# Patient Record
Sex: Female | Born: 1965
Health system: Southern US, Community
[De-identification: ages and names within clinical notes are randomized; demographics above are authoritative.]

## PROBLEM LIST (undated history)

## (undated) ENCOUNTER — Emergency Department (HOSPITAL_COMMUNITY): Admission: EM | Payer: Self-pay | Source: Home / Self Care

## (undated) DIAGNOSIS — K219 Gastro-esophageal reflux disease without esophagitis: Secondary | ICD-10-CM

## (undated) DIAGNOSIS — T7840XA Allergy, unspecified, initial encounter: Secondary | ICD-10-CM

## (undated) DIAGNOSIS — N979 Female infertility, unspecified: Secondary | ICD-10-CM

## (undated) DIAGNOSIS — D649 Anemia, unspecified: Secondary | ICD-10-CM

## (undated) DIAGNOSIS — K625 Hemorrhage of anus and rectum: Secondary | ICD-10-CM

## (undated) DIAGNOSIS — H269 Unspecified cataract: Secondary | ICD-10-CM

## (undated) DIAGNOSIS — B009 Herpesviral infection, unspecified: Secondary | ICD-10-CM

## (undated) HISTORY — DX: Herpesviral infection, unspecified: B00.9

## (undated) HISTORY — PX: CYST REMOVAL HAND: SHX6279

## (undated) HISTORY — DX: Unspecified cataract: H26.9

## (undated) HISTORY — DX: Anemia, unspecified: D64.9

## (undated) HISTORY — DX: Female infertility, unspecified: N97.9

## (undated) HISTORY — DX: Gastro-esophageal reflux disease without esophagitis: K21.9

## (undated) HISTORY — DX: Hemorrhage of anus and rectum: K62.5

## (undated) HISTORY — DX: Allergy, unspecified, initial encounter: T78.40XA

## (undated) HISTORY — PX: EYE SURGERY: SHX253

---

## 2001-05-05 ENCOUNTER — Other Ambulatory Visit: Admission: RE | Admit: 2001-05-05 | Discharge: 2001-05-05 | Payer: Self-pay | Admitting: Obstetrics and Gynecology

## 2001-05-19 ENCOUNTER — Encounter: Payer: Self-pay | Admitting: *Deleted

## 2001-05-19 ENCOUNTER — Ambulatory Visit (HOSPITAL_COMMUNITY): Admission: RE | Admit: 2001-05-19 | Discharge: 2001-05-19 | Payer: Self-pay | Admitting: *Deleted

## 2002-07-06 ENCOUNTER — Other Ambulatory Visit: Admission: RE | Admit: 2002-07-06 | Discharge: 2002-07-06 | Payer: Self-pay | Admitting: Obstetrics and Gynecology

## 2003-07-26 ENCOUNTER — Other Ambulatory Visit: Admission: RE | Admit: 2003-07-26 | Discharge: 2003-07-26 | Payer: Self-pay | Admitting: Obstetrics and Gynecology

## 2005-07-15 ENCOUNTER — Encounter
Admission: RE | Admit: 2005-07-15 | Discharge: 2005-09-05 | Payer: Self-pay | Admitting: Physical Medicine & Rehabilitation

## 2006-08-29 ENCOUNTER — Encounter: Admission: RE | Admit: 2006-08-29 | Discharge: 2006-08-29 | Payer: Self-pay | Admitting: Obstetrics & Gynecology

## 2007-06-29 ENCOUNTER — Encounter: Payer: Self-pay | Admitting: Internal Medicine

## 2007-07-10 ENCOUNTER — Ambulatory Visit: Payer: Self-pay | Admitting: Internal Medicine

## 2007-07-10 DIAGNOSIS — J309 Allergic rhinitis, unspecified: Secondary | ICD-10-CM | POA: Insufficient documentation

## 2007-07-10 DIAGNOSIS — B351 Tinea unguium: Secondary | ICD-10-CM

## 2007-07-10 DIAGNOSIS — K921 Melena: Secondary | ICD-10-CM | POA: Insufficient documentation

## 2007-07-10 DIAGNOSIS — K219 Gastro-esophageal reflux disease without esophagitis: Secondary | ICD-10-CM

## 2007-07-10 DIAGNOSIS — Z872 Personal history of diseases of the skin and subcutaneous tissue: Secondary | ICD-10-CM | POA: Insufficient documentation

## 2007-07-10 DIAGNOSIS — K625 Hemorrhage of anus and rectum: Secondary | ICD-10-CM

## 2007-07-10 DIAGNOSIS — D649 Anemia, unspecified: Secondary | ICD-10-CM | POA: Insufficient documentation

## 2007-07-10 HISTORY — DX: Hemorrhage of anus and rectum: K62.5

## 2007-07-24 ENCOUNTER — Ambulatory Visit: Payer: Self-pay | Admitting: Internal Medicine

## 2007-07-24 ENCOUNTER — Ambulatory Visit: Payer: Self-pay | Admitting: Gastroenterology

## 2007-07-24 DIAGNOSIS — M8920 Other disorders of bone development and growth, unspecified site: Secondary | ICD-10-CM

## 2007-07-27 LAB — CONVERTED CEMR LAB
AST: 21 units/L (ref 0–37)
Basophils Relative: 0.2 % (ref 0.0–1.0)
CO2: 27 meq/L (ref 19–32)
Chloride: 101 meq/L (ref 96–112)
Cholesterol: 158 mg/dL (ref 0–200)
Creatinine, Ser: 0.7 mg/dL (ref 0.4–1.2)
Eosinophils Relative: 1.3 % (ref 0.0–5.0)
Ferritin: 42.8 ng/mL (ref 10.0–291.0)
Glucose, Bld: 89 mg/dL (ref 70–99)
HCT: 35.6 % — ABNORMAL LOW (ref 36.0–46.0)
Hemoglobin: 12.5 g/dL (ref 12.0–15.0)
LDL Cholesterol: 89 mg/dL (ref 0–99)
MCHC: 35.2 g/dL (ref 30.0–36.0)
Monocytes Absolute: 0.3 10*3/uL (ref 0.2–0.7)
Neutrophils Relative %: 49.3 % (ref 43.0–77.0)
Potassium: 3.9 meq/L (ref 3.5–5.1)
RDW: 11.8 % (ref 11.5–14.6)
Sodium: 134 meq/L — ABNORMAL LOW (ref 135–145)
TSH: 1.3 microintl units/mL (ref 0.35–5.50)
Total Bilirubin: 0.7 mg/dL (ref 0.3–1.2)
Total Protein: 6.4 g/dL (ref 6.0–8.3)
VLDL: 4 mg/dL (ref 0–40)
WBC: 3.4 10*3/uL — ABNORMAL LOW (ref 4.5–10.5)

## 2007-08-03 ENCOUNTER — Encounter: Payer: Self-pay | Admitting: Internal Medicine

## 2007-08-19 ENCOUNTER — Ambulatory Visit: Payer: Self-pay | Admitting: Gastroenterology

## 2007-08-19 ENCOUNTER — Encounter: Payer: Self-pay | Admitting: Internal Medicine

## 2007-08-20 HISTORY — PX: LASIK: SHX215

## 2007-10-13 ENCOUNTER — Telehealth: Payer: Self-pay | Admitting: Internal Medicine

## 2007-11-04 ENCOUNTER — Encounter: Payer: Self-pay | Admitting: Internal Medicine

## 2008-01-07 ENCOUNTER — Ambulatory Visit (HOSPITAL_BASED_OUTPATIENT_CLINIC_OR_DEPARTMENT_OTHER): Admission: RE | Admit: 2008-01-07 | Discharge: 2008-01-07 | Payer: Self-pay | Admitting: Orthopedic Surgery

## 2008-02-24 ENCOUNTER — Telehealth: Payer: Self-pay | Admitting: Internal Medicine

## 2008-05-04 ENCOUNTER — Encounter: Payer: Self-pay | Admitting: Internal Medicine

## 2008-06-30 ENCOUNTER — Ambulatory Visit (HOSPITAL_COMMUNITY): Admission: RE | Admit: 2008-06-30 | Discharge: 2008-06-30 | Payer: Self-pay | Admitting: Obstetrics & Gynecology

## 2008-07-05 ENCOUNTER — Telehealth: Payer: Self-pay | Admitting: Internal Medicine

## 2009-04-17 ENCOUNTER — Telehealth: Payer: Self-pay | Admitting: *Deleted

## 2009-05-15 ENCOUNTER — Emergency Department (HOSPITAL_COMMUNITY): Admission: EM | Admit: 2009-05-15 | Discharge: 2009-05-15 | Payer: Self-pay | Admitting: Family Medicine

## 2009-05-20 ENCOUNTER — Encounter: Payer: Self-pay | Admitting: Internal Medicine

## 2009-05-20 LAB — CONVERTED CEMR LAB
ALT: 12 units/L (ref 0–35)
AST: 17 units/L (ref 0–37)
Alkaline Phosphatase: 44 units/L (ref 39–117)
Basophils Absolute: 0 10*3/uL (ref 0.0–0.1)
Basophils Relative: 0 % (ref 0–1)
Bilirubin, Direct: 0.1 mg/dL (ref 0.0–0.3)
CO2: 23 meq/L (ref 19–32)
Calcium: 8.8 mg/dL (ref 8.4–10.5)
Cholesterol: 138 mg/dL (ref 0–200)
Creatinine, Ser: 0.76 mg/dL (ref 0.40–1.20)
Eosinophils Absolute: 0 10*3/uL (ref 0.0–0.7)
Glucose, Bld: 86 mg/dL (ref 70–99)
Indirect Bilirubin: 0.3 mg/dL (ref 0.0–0.9)
MCHC: 33.9 g/dL (ref 30.0–36.0)
MCV: 87.4 fL (ref 78.0–100.0)
Monocytes Absolute: 0.2 10*3/uL (ref 0.1–1.0)
Monocytes Relative: 5 % (ref 3–12)
Neutro Abs: 2.6 10*3/uL (ref 1.7–7.7)
Neutrophils Relative %: 58 % (ref 43–77)
RDW: 12.3 % (ref 11.5–15.5)
Sodium: 138 meq/L (ref 135–145)
TSH: 0.981 microintl units/mL (ref 0.350–4.500)
Total Bilirubin: 0.4 mg/dL (ref 0.3–1.2)
Total CHOL/HDL Ratio: 2.3

## 2009-05-23 ENCOUNTER — Ambulatory Visit: Payer: Self-pay | Admitting: Internal Medicine

## 2009-06-20 ENCOUNTER — Emergency Department (HOSPITAL_COMMUNITY): Admission: EM | Admit: 2009-06-20 | Discharge: 2009-06-20 | Payer: Self-pay | Admitting: Emergency Medicine

## 2009-08-08 ENCOUNTER — Encounter: Payer: Self-pay | Admitting: Internal Medicine

## 2009-09-20 ENCOUNTER — Ambulatory Visit: Payer: Self-pay | Admitting: Internal Medicine

## 2009-09-20 DIAGNOSIS — R209 Unspecified disturbances of skin sensation: Secondary | ICD-10-CM | POA: Insufficient documentation

## 2009-10-27 ENCOUNTER — Encounter: Payer: Self-pay | Admitting: Internal Medicine

## 2009-11-12 ENCOUNTER — Ambulatory Visit (HOSPITAL_COMMUNITY): Admission: RE | Admit: 2009-11-12 | Discharge: 2009-11-12 | Payer: Self-pay | Admitting: Neurology

## 2010-09-09 ENCOUNTER — Encounter: Payer: Self-pay | Admitting: Obstetrics & Gynecology

## 2010-09-20 NOTE — Letter (Signed)
Summary: [facility]  [facility]   Imported By: Maryln Gottron 09/14/2009 09:41:57  _____________________________________________________________________  External Attachment:    Type:   Image     Comment:   External Document

## 2010-09-20 NOTE — Assessment & Plan Note (Signed)
Summary: consult re: facial pain and dizzines/intermitent/cjr   Vital Signs:  Patient profile:   45 year old female Menstrual status:  regular LMP:     09/06/2009 Weight:      117 pounds Temp:     98.7 degrees F oral Pulse rate:   66 / minute BP sitting:   100 / 60  (right arm) Cuff size:   regular  Vitals Entered By: Romualdo Bolk, CMA (AAMA) (September 20, 2009 3:54 PM) CC: Pt is here to discuss burning on cheek bone and then numbness and tingling around lip on rt side. This has been going on and off a few years. LMP (date): 09/06/2009 LMP - Character: normal Menarche (age onset years): 12   Menses interval (days): 21-28 Menstrual flow (days): 5 Enter LMP: 09/06/2009 Last PAP Result normal   History of Present Illness: Valerie [name] comes  in today for   above.  Onset  she believes   she started the pain with neuralgia after the crown in upper teeth  and then root canal . then got better .   has had 3 root canals on right upper top teeth.  Early on used gabapentin short term then resolved.  However   more recently has had increasing frequency without other cause . temp sensitivities , numbness tingling  in summer .   imaging of teeth show no new problem and not felt to be dental  .   burning pain  mostly on right cheek lasts  for hours  or less.  no assoicated symptom   Gets labial oral l hsv  but not related.   no HAs syncope or weakness.   Considering pregnancy and wishes further opinon as to etioogy and prognosis of this situation.    Preventive Screening-Counseling & Management  Alcohol-Tobacco     Alcohol drinks/day: <1     Alcohol type: mixed drinks     Smoking Status: never  Caffeine-Diet-Exercise     Caffeine use/day: 2     Does Patient Exercise: yes     Type of exercise: walking, wts, pilates     Times/week: 3 hours  Current Medications (verified): 1)  Claritin 10 Mg  Tabs (Loratadine) 2)  Multiple Vitamin   Tabs (Multiple Vitamin) 3)  Msm 1500 Mg   Tabs (Methylsulfonylmethane) 4)  Fish Oil   Oil (Fish Oil) 5)  Allergy Shots 6)  Valtrex 1 Gm  Tabs (Valacyclovir Hcl) .... Take 2 and Repeat in 12 Hours or As Directed 7)  Vitamin B Complex-C   Caps (B Complex-C)  Allergies (verified): 1)  ! Biaxin 2)  ! Vantin (Cefpodoxime Proxetil) 3)  ! Augmentin  Past History:  Past medical, surgical, family and social histories (including risk factors) reviewed, and no changes noted (except as noted below).  Past Medical History: Reviewed history from 05/23/2009 and no changes required. Allergic rhinitis     on shots  cats and seasonal  Anemia-NOS GERD hsv labialis  Past Surgical History: Lasik- [facility]- 2009  Past History:  Care Management: Gynecology: Jonny Ruiz Allergy: [place]  Family History: Reviewed history from 07/10/2007 and no changes required. Family History of Alcoholism/Addiction- Uncles Family History Breast cancer 1st degree relative <50-Aunt (Maternal Mom's cousin) Family History Diabetes 1st degree relative-father, Maternal grandparents Family History High cholesterol- father, brother Family History of Stroke F 1st degree relative <60-Maternal Grandmother Family History of Stroke M 1st degree relative <50-Father and Uncles Family History of Sudden Death- Maternal Grandfather Family History of  Cardiovascular disorder- Father Bipass, Maternal Grandmother CHF colon polyps ht brother elev TG  Social History: Reviewed history from 05/23/2009 and no changes required. 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 hhof 1   no pets   Review of Systems  The patient denies anorexia, fever, weight loss, weight gain, vision loss, decreased hearing, hoarseness, chest pain, syncope, dyspnea on exertion, headaches, muscle weakness, transient blindness, difficulty walking, abnormal bleeding, and enlarged  lymph nodes.         ocass dizzy  when tired  no syncope or vertigo   Physical Exam  General:  Well-developed,well-nourished,in no acute distress; alert,appropriate and cooperative throughout examination Head:  normocephalic, atraumatic, and no abnormalities observed.  no tenderness   face no lesions  Eyes:  PERRL, EOMs full, conjunctiva clear  Ears:  R ear normal, L ear normal, and no external deformities.  slight tmj crepitis Nose:  no external deformity, no external erythema, and no nasal discharge.   Mouth:  good dentition and pharynx pink and moist.   Neck:  No deformities, masses, or tenderness noted. no bruits  Lungs:  Normal respiratory effort, chest expands symmetrically. Lungs are clear to auscultation, no crackles or wheezes. Heart:  Normal rate and regular rhythm. S1 and S2 normal without gallop, murmur, click, rub or other extra sounds. Neurologic:  alert & oriented X3, strength normal in all extremities, gait normal, DTRs symmetrical and normal, and Romberg negative.   neg drift . tandem gait ok  no noted weakness or clonus Skin:  turgor normal, color normal, no ecchymoses, and no petechiae.   Cervical Nodes:  No lymphadenopathy noted Psych:  Oriented X3, good eye contact, not anxious appearing, and not depressed appearing.     Impression & Recommendations:  Problem # 1:  DYSESTHESIA (ICD-782.0) face    poss related to previous  dental procedure but has progressed some .   r/o cns, ms  or other  causes .  options discussed . will get neuro opinion. Consider imaging.   Orders: Neurology Referral (Neuro)  Complete Medication List: 1)  Claritin 10 Mg Tabs (Loratadine) 2)  Multiple Vitamin Tabs (Multiple vitamin) 3)  Msm 1500 Mg Tabs (Methylsulfonylmethane) 4)  Fish Oil Oil (Fish oil) 5)  Allergy Shots  6)  Valtrex 1 Gm Tabs (Valacyclovir hcl) .... Take 2 and repeat in 12 hours or as directed 7)  Vitamin B Complex-c Caps (B complex-c)  Patient Instructions: 1)  will  refer to Dr Anne Hahn  2)  call as needed if needed

## 2010-09-20 NOTE — Consult Note (Signed)
Summary: [facility]  [facility]   Imported By: Maryln Gottron 11/01/2009 12:28:31  _____________________________________________________________________  External Attachment:    Type:   Image     Comment:   External Document

## 2011-01-04 NOTE — Op Note (Signed)
Valerie [name], Valerie [name]            ACCOUNT NO.:  [id number]   MEDICAL RECORD NO.:  [id number]          PATIENT TYPE:  AMB   LOCATION:  [facility]SC                          FACILITY:  [facility]   PHYSICIAN:  Katy Fitch. Sypher, M.D. 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   POSTOPERATIVE DIAGNOSES:  Stage III paronychia and felon.   OPERATION:  Incision and drainage of left thumb dorsal and ulnar nail  fold as well as pulp incision and drainage.   SURGEON:  Katy Fitch. Sypher, MD   ASSISTANT:  None.   ANESTHESIA:  Lidocaine 2% metacarpal head level block at left thumb.  This was performed in the minor operating room of the [facility].   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  She presented for evaluation on Jan 06, 2008, of a possible  felon and paronychia.   She has a history of herpes simplex viral colonization and has had fever  blisters.  Her presentation was challenging to interpret.  She did not  have the features of a bacterial paronychia, but had had a prodrome of  tingling and burning.  We initially began treatment for both paronychia  and possible herpetic whitlow.  She was advised to use an antiviral as  well as an antibiotic.  She had been taking Keflex.  She held the Keflex  due to GI upset.   We asked her to contact us within 12-24 hours if her thumb progressed  and in deed she developed some purulent material beneath the dorsal nail  fold.  Therefore, she was asked to come to the Surgical Center for an  urgent incision and drainage of her thumb.   PROCEDURE IN DETAIL:  Valerie [name] was seen in the minor operating  room at the [facility].  Inspection of her thumb revealed  collection of purulent material beneath the dorsal ulnar nail fold.  This is consistent with evolution of a bacterial paronychia.   We advised proceeding with incision and drainage.   Her left hand and arm were prepped with Betadine soap and solution.  Lidocaine 2% was infiltrated at metacarpal head level to obtain a  digital block.   After 5 minutes, excellent anesthesia was achieved.   The finger was not exsanguinated; however, a quarter-inch Penrose drain  was placed as a digital tourniquet.   The ulnar 3 mm of the nail plate was removed with tenotomy scissors and  a hemostat.  The dorsal nail fold was probed with a sharp Market researcher.  Immediately, purulent material was recovered.  This was  cultured for aerobic and anaerobic growth including a Gram stain.   Her dorsal nail fold was then drained and unroofed.  A considerable  amount of purulent material was recovered from the dorsal ulnar nail  fold.  The pulp was entered directly on  the periosteum from an ulnar  aspect.  Some serosanguineous fluid was recovered from the pulp.   After decompression, the pulp tension was increased.   The wound was then rinsed with sterile saline followed by packing with  iodoform gauze.   We have asked [place] to return for followup in 24 hours for dressing  change and initiation of whirlpool therapy.   Her immediate Gram stain results revealed gram-positive cocci in pairs.   She will take doxycycline 100 mg p.o. b.i.d. x7 days and Septra DS one  p.o. b.i.d. to rule out a possible methicillin-resistant Staph  infection.   Note that she also has a prescription for Vicodin 5 mg one p.o. q.4-6 h.  p.r.n. pain as a postoperative analgesia.      Katy Fitch Sypher, M.D.  Electronically Signed     RVS/MEDQ  D:  01/08/2008  T:  01/09/2008  Job:  [id number]

## 2011-01-25 ENCOUNTER — Emergency Department (HOSPITAL_BASED_OUTPATIENT_CLINIC_OR_DEPARTMENT_OTHER)
Admission: EM | Admit: 2011-01-25 | Discharge: 2011-01-26 | Disposition: A | Discharge status: Home / Self Care | Payer: 59 | Attending: Emergency Medicine | Admitting: Emergency Medicine

## 2011-01-25 DIAGNOSIS — R112 Nausea with vomiting, unspecified: Secondary | ICD-10-CM | POA: Insufficient documentation

## 2011-01-25 DIAGNOSIS — K2970 Gastritis, unspecified, without bleeding: Secondary | ICD-10-CM | POA: Insufficient documentation

## 2011-01-25 DIAGNOSIS — K2990 Gastroduodenitis, unspecified, without bleeding: Secondary | ICD-10-CM | POA: Insufficient documentation

## 2011-01-25 LAB — URINALYSIS, ROUTINE W REFLEX MICROSCOPIC
Bilirubin Urine: NEGATIVE
Glucose, UA: NEGATIVE mg/dL
Hgb urine dipstick: NEGATIVE
Specific Gravity, Urine: 1.024 (ref 1.005–1.030)
Urobilinogen, UA: 0.2 mg/dL (ref 0.0–1.0)
pH: 6 (ref 5.0–8.0)

## 2011-01-25 LAB — COMPREHENSIVE METABOLIC PANEL
ALT: 19 U/L (ref 0–35)
Alkaline Phosphatase: 47 U/L (ref 39–117)
BUN: 13 mg/dL (ref 6–23)
CO2: 24 mEq/L (ref 19–32)
Calcium: 9.1 mg/dL (ref 8.4–10.5)
GFR calc Af Amer: >60 mL/min (ref >60)
GFR calc non Af Amer: >60 mL/min (ref >60)
Glucose, Bld: 103 mg/dL — ABNORMAL HIGH (ref 70–99)
Sodium: 133 mEq/L — ABNORMAL LOW (ref 135–145)

## 2011-01-25 LAB — DIFFERENTIAL
Basophils Relative: 0 % (ref 0–1)
Lymphocytes Relative: 28 % (ref 12–46)
Lymphs Abs: 1.1 10*3/uL (ref 0.7–4.0)
Monocytes Absolute: 0.2 10*3/uL (ref 0.1–1.0)
Monocytes Relative: 5 % (ref 3–12)
Neutro Abs: 2.7 10*3/uL (ref 1.7–7.7)
Neutrophils Relative %: 66 % (ref 43–77)

## 2011-01-25 LAB — CBC
HCT: 35.6 % — ABNORMAL LOW (ref 36.0–46.0)
Hemoglobin: 12.7 g/dL (ref 12.0–15.0)
MCH: 30.2 pg (ref 26.0–34.0)
MCHC: 35.7 g/dL (ref 30.0–36.0)
RBC: 4.21 MIL/uL (ref 3.87–5.11)

## 2011-01-25 LAB — LIPASE, BLOOD: Lipase: 26 U/L (ref 11–59)

## 2011-03-28 ENCOUNTER — Encounter: Payer: Self-pay | Admitting: Family Medicine

## 2011-03-28 ENCOUNTER — Ambulatory Visit (INDEPENDENT_AMBULATORY_CARE_PROVIDER_SITE_OTHER): Payer: 59 | Admitting: Family Medicine

## 2011-03-28 VITALS — BP 110/70 | Temp 98.3°F | Wt 119.0 lb

## 2011-03-28 DIAGNOSIS — M549 Dorsalgia, unspecified: Secondary | ICD-10-CM

## 2011-03-28 DIAGNOSIS — M546 Pain in thoracic spine: Secondary | ICD-10-CM

## 2011-03-28 MED ORDER — CYCLOBENZAPRINE HCL 5 MG PO TABS: 5.0000 mg | ORAL_TABLET | Freq: Three times a day (TID) | ORAL | Status: AC | PRN

## 2011-03-28 NOTE — Progress Notes (Signed)
  Subjective:    Patient ID: Valerie [name], female    DOB: [birth year], 45 y.o.   MRN: [id number]  HPI Healthy 45 year old female seen with several month history of progressive upper back neck and thoracic pain. No spinal pain but pain is more diffuse and bilateral. Started mostly neck and upper back and spreading inferior. No specific injury. Pain relatively constant.  Achy quality and 5/10 severity at worse. Symptoms seem to be worse late in the day. No specific injury. Works as a Transport planner. Denies radiculopathy symptoms. Does exercise a few days per week. sleep generally okay. No clear exacerbating features. Cannot tolerate ibuprofen. Heat without much relief. Denies any systemic symptoms such as appetite or weight changes, fever, chills, cough, dyspnea, or any weakness   Review of Systems  Constitutional: Negative for fever, chills, appetite change, fatigue and unexpected weight change.  Respiratory: Negative for cough and shortness of breath.   Cardiovascular: Negative for chest pain.  Musculoskeletal: Positive for back pain. Negative for arthralgias.  Skin: Negative for rash.  Neurological: Negative for headaches.  Hematological: Negative for adenopathy.       Objective:   Physical Exam  Constitutional: She appears well-developed and well-nourished. No distress.  Neck: Normal range of motion. Neck supple. No thyromegaly present.  Cardiovascular: Normal rate and regular rhythm.   Pulmonary/Chest: Effort normal and breath sounds normal. No respiratory distress. She has no wheezes. She has no rales.  Musculoskeletal:       No upper extremity muscle atrophy. Full range of motion both shoulders  Mild tenderness trapezius muscles bilaterally  Lymphadenopathy:    She has no cervical adenopathy.  Neurological:       No weakness upper extremities. Symmetric reflexes          Assessment & Plan:  Progressive upper back and lower thoracic pain. Suspect muscular soft tissue etiology. Referral  for physical therapy and try low-dose cyclobenzaprine 5 mg each bedtime. Aerobic exercise as tolerated

## 2011-03-28 NOTE — Patient Instructions (Signed)
We will call you regarding physical therapy appointment

## 2011-05-09 ENCOUNTER — Ambulatory Visit: Payer: 59 | Attending: Family Medicine | Admitting: Rehabilitative and Restorative Service Providers"

## 2011-05-09 DIAGNOSIS — M546 Pain in thoracic spine: Secondary | ICD-10-CM | POA: Insufficient documentation

## 2011-05-09 DIAGNOSIS — M2569 Stiffness of other specified joint, not elsewhere classified: Secondary | ICD-10-CM | POA: Insufficient documentation

## 2011-05-09 DIAGNOSIS — IMO0001 Reserved for inherently not codable concepts without codable children: Secondary | ICD-10-CM | POA: Insufficient documentation

## 2011-05-14 ENCOUNTER — Ambulatory Visit: Payer: 59 | Admitting: Physical Therapy

## 2011-05-15 LAB — WOUND CULTURE: Gram Stain: NONE SEEN

## 2011-05-15 LAB — ANAEROBIC CULTURE: Gram Stain: NONE SEEN

## 2011-05-22 ENCOUNTER — Ambulatory Visit: Payer: 59 | Attending: Family Medicine | Admitting: Physical Therapy

## 2011-05-22 DIAGNOSIS — IMO0001 Reserved for inherently not codable concepts without codable children: Secondary | ICD-10-CM | POA: Insufficient documentation

## 2011-05-22 DIAGNOSIS — M546 Pain in thoracic spine: Secondary | ICD-10-CM | POA: Insufficient documentation

## 2011-05-22 DIAGNOSIS — M2569 Stiffness of other specified joint, not elsewhere classified: Secondary | ICD-10-CM | POA: Insufficient documentation

## 2011-05-27 ENCOUNTER — Ambulatory Visit: Payer: 59 | Admitting: Physical Therapy

## 2011-05-29 ENCOUNTER — Ambulatory Visit: Payer: 59 | Admitting: Physical Therapy

## 2011-06-04 ENCOUNTER — Ambulatory Visit: Payer: 59 | Admitting: Physical Therapy

## 2011-06-05 ENCOUNTER — Encounter: Payer: 59 | Admitting: Physical Therapy

## 2011-06-10 ENCOUNTER — Ambulatory Visit: Payer: 59 | Admitting: Physical Therapy

## 2011-06-12 ENCOUNTER — Encounter: Payer: 59 | Admitting: Physical Therapy

## 2011-06-13 ENCOUNTER — Encounter: Payer: 59 | Admitting: Rehabilitative and Restorative Service Providers"

## 2011-06-20 ENCOUNTER — Ambulatory Visit: Payer: 59 | Attending: Family Medicine | Admitting: Physical Therapy

## 2011-06-20 DIAGNOSIS — M546 Pain in thoracic spine: Secondary | ICD-10-CM | POA: Insufficient documentation

## 2011-06-20 DIAGNOSIS — IMO0001 Reserved for inherently not codable concepts without codable children: Secondary | ICD-10-CM | POA: Insufficient documentation

## 2011-06-20 DIAGNOSIS — M2569 Stiffness of other specified joint, not elsewhere classified: Secondary | ICD-10-CM | POA: Insufficient documentation

## 2011-07-01 ENCOUNTER — Telehealth: Payer: Self-pay | Admitting: Family Medicine

## 2011-07-01 DIAGNOSIS — Z0000 Encounter for general adult medical examination without abnormal findings: Secondary | ICD-10-CM

## 2011-07-01 NOTE — Telephone Encounter (Signed)
This pt is going to [facility] on 11/28 for cpx labs. Can the orders be put in please? Thanks.

## 2011-07-01 NOTE — Telephone Encounter (Signed)
Orders placed in epic

## 2011-07-10 ENCOUNTER — Other Ambulatory Visit: Payer: 59

## 2011-07-17 ENCOUNTER — Encounter: Payer: 59 | Admitting: Internal Medicine

## 2011-07-19 ENCOUNTER — Ambulatory Visit: Payer: 59 | Admitting: Rehabilitation

## 2011-07-25 ENCOUNTER — Ambulatory Visit: Payer: 59 | Admitting: Rehabilitative and Restorative Service Providers"

## 2011-07-31 ENCOUNTER — Ambulatory Visit: Payer: 59 | Admitting: Rehabilitative and Restorative Service Providers"

## 2011-08-27 ENCOUNTER — Telehealth: Payer: Self-pay | Admitting: *Deleted

## 2011-08-27 NOTE — Telephone Encounter (Signed)
[employer] name of company?

## 2011-08-27 NOTE — Telephone Encounter (Signed)
Asking for Medical Necessity Form to be faxed and to call [phone].  She states her name is Valerie [name], but I cannot understand the name of the company.  Will send you the call, Harriett Sine to see if you may recognize it.

## 2011-11-25 ENCOUNTER — Telehealth: Payer: Self-pay | Admitting: Internal Medicine

## 2011-11-25 NOTE — Telephone Encounter (Signed)
Pls advise.  

## 2011-11-25 NOTE — Telephone Encounter (Signed)
Unsure what this means  Ok to do cpx labs  Preferred in a location  where it flows into EHR.  Please arrange for this. V70.0

## 2011-11-25 NOTE — Telephone Encounter (Signed)
Patient called stating that she would like to have her cpx labs done at [facility] and wanted to know if she can have an rx for that. Please advise

## 2011-11-29 ENCOUNTER — Other Ambulatory Visit: Payer: Self-pay | Admitting: Internal Medicine

## 2011-11-29 LAB — BASIC METABOLIC PANEL
Calcium: 8.8 mg/dL (ref 8.4–10.5)
Glucose, Bld: 81 mg/dL (ref 70–99)
Potassium: 3.8 mEq/L (ref 3.5–5.3)
Sodium: 135 mEq/L (ref 135–145)

## 2011-11-29 LAB — CBC WITH DIFFERENTIAL/PLATELET
Basophils Relative: 0 % (ref 0–1)
Hemoglobin: 12.6 g/dL (ref 12.0–15.0)
Lymphocytes Relative: 48 % — ABNORMAL HIGH (ref 12–46)
Lymphs Abs: 1.6 10*3/uL (ref 0.7–4.0)
MCHC: 34.1 g/dL (ref 30.0–36.0)
Monocytes Relative: 7 % (ref 3–12)
Neutro Abs: 1.4 10*3/uL — ABNORMAL LOW (ref 1.7–7.7)
Neutrophils Relative %: 44 % (ref 43–77)
RBC: 4.2 MIL/uL (ref 3.87–5.11)
WBC: 3.2 10*3/uL — ABNORMAL LOW (ref 4.0–10.5)

## 2011-11-29 LAB — TSH: TSH: 1.36 u[IU]/mL (ref 0.350–4.500)

## 2011-11-29 LAB — HEPATIC FUNCTION PANEL
ALT: 15 U/L (ref 0–35)
Bilirubin, Direct: 0.1 mg/dL (ref 0.0–0.3)
Indirect Bilirubin: 0.3 mg/dL (ref 0.0–0.9)
Total Protein: 6.8 g/dL (ref 6.0–8.3)

## 2011-11-30 LAB — URINALYSIS, COMPLETE
Bacteria, UA: NONE SEEN
Bilirubin Urine: NEGATIVE
Hgb urine dipstick: NEGATIVE
Ketones, ur: NEGATIVE mg/dL
Protein, ur: NEGATIVE mg/dL
Urobilinogen, UA: 0.2 mg/dL (ref 0.0–1.0)

## 2011-12-03 ENCOUNTER — Encounter: Payer: Self-pay | Admitting: Internal Medicine

## 2011-12-03 ENCOUNTER — Ambulatory Visit (INDEPENDENT_AMBULATORY_CARE_PROVIDER_SITE_OTHER): Payer: 59 | Admitting: Internal Medicine

## 2011-12-03 VITALS — BP 104/72 | HR 76 | Temp 98.4°F | Ht 63.5 in | Wt 118.0 lb

## 2011-12-03 DIAGNOSIS — J309 Allergic rhinitis, unspecified: Secondary | ICD-10-CM

## 2011-12-03 DIAGNOSIS — R142 Eructation: Secondary | ICD-10-CM

## 2011-12-03 DIAGNOSIS — Z0000 Encounter for general adult medical examination without abnormal findings: Secondary | ICD-10-CM

## 2011-12-03 DIAGNOSIS — R141 Gas pain: Secondary | ICD-10-CM

## 2011-12-03 DIAGNOSIS — R140 Abdominal distension (gaseous): Secondary | ICD-10-CM

## 2011-12-03 NOTE — Progress Notes (Signed)
Subjective:    Patient ID: Valerie [name], female    DOB: 1966/02/12, 46 y.o.   MRN: [id number]  HPI Patient comes in today for Preventive Health Care visit  GYNE: sees yearly  Did fertility treatments in the past  . Not now. Allergy : sharma   Shots   All to cats   Decreasing .  Massage :  Helps neck upper back.  Has some bloating without diet or stool changes  Poss from age  But concern about risk of ovarian cancer . No other sx related . Does take  Fish oil supplements.   Review of Systems ROS:  GEN/ HEENT: No fever, significant weight changes sweats headaches vision problems hearing changes, CV/ PULM; No chest pain shortness of breath cough, syncope,edema  change in exercise tolerance. GI /GU: No adominal pain, vomiting, change in bowel habits. No blood in the stool. No significant GU symptoms. Bloating and gas a lot.  ? Why. SKIN/HEME: ,no acute skin rashes suspicious lesions or bleeding. No lymphadenopathy, nodules, masses.  NEURO/ PSYCH:  No neurologic signs such as weakness numbness. No depression anxiety. IMM/ Allergy: No unusual infections.  Allergy .   REST of 12 system review negative except as per HPI  Past history family history social history reviewed in the electronic medical record.      Objective:   Physical Exam BP 104/72  Pulse 76  Temp(Src) 98.4 F (36.9 C) (Oral)  Ht 5' 3.5" (1.613 m)  Wt 118 lb (53.524 kg)  BMI 20.57 kg/m2  SpO2 98%  LMP 11/12/2011 Wt Readings from Last 3 Encounters:  12/03/11 118 lb (53.524 kg)  03/28/11 119 lb (53.978 kg)  09/20/09 117 lb (53.071 kg)    Physical Exam: Vital signs reviewed WUJ:WJXB is a well-developed well-nourished alert cooperative  female who appears her stated age in no acute distress.  HEENT: normocephalic atraumatic , Eyes: PERRL EOM's full, conjunctiva clear, Nares: paten,t no deformity discharge or tenderness., Ears: no deformity EAC's clear TMs with normal landmarks. Mouth: clear OP, no lesions, edema.   Moist mucous membranes. Dentition in adequate repair. NECK: supple without masses, thyromegaly or bruits. CHEST/PULM:  Clear to auscultation and percussion breath sounds equal no wheeze , rales or rhonchi. No chest wall deformities or tenderness. CV: PMI is nondisplaced, S1 S2 no gallops, murmurs, rubs. Peripheral pulses are full without delay.No JVD .  ABDOMEN: Bowel sounds normal nontender  No guard or rebound, no hepato splenomegal no CVA tenderness.  No hernia. Extremtities:  No clubbing cyanosis or edema, no acute joint swelling or redness no focal atrophy NEURO:  Oriented x3, cranial nerves 3-12 appear to be intact, no obvious focal weakness,gait within normal limits no abnormal reflexes SKIN: No acute rashes normal turgor, color, no bruising or petechiae. PSYCH: Oriented, good eye contact, no obvious depression anxiety, cognition and judgment appear normal. LN: no cervical axillaryadenopathy    Lab Results  Component Value Date   WBC 3.2* 11/29/2011   HGB 12.6 11/29/2011   HCT 36.9 11/29/2011   PLT 179 11/29/2011   GLUCOSE 81 11/29/2011   CHOL 138 05/20/2009   TRIG 37 05/20/2009   HDL 60 05/20/2009   LDLCALC 71 05/20/2009   ALT 15 11/29/2011   AST 19 11/29/2011   NA 135 11/29/2011   K 3.8 11/29/2011   CL 102 11/29/2011   CREATININE 0.75 11/29/2011   BUN 11 11/29/2011   CO2 26 11/29/2011   TSH 1.360 11/29/2011  Assessment & Plan:   Preventive Health Care Counseled regarding healthy nutrition, exercise, sleep, injury prevention, calcium vit d and healthy weight .Healthy   abd bloating  does'nt  sound pathologic  Consider stopping the FO   And disc concerns about gyne cause with dr Seymour Bars  . doestn drink milk much to explain lactose intolerance   Allergic   On shots    Has cat at home.

## 2011-12-03 NOTE — Patient Instructions (Signed)
Continue lifestyle intervention healthy eating and exercise . Talk with GYNE about abdominal  bloating but is probably dietary.   Optimize sleep  For energy level and health.

## 2012-02-25 ENCOUNTER — Ambulatory Visit (INDEPENDENT_AMBULATORY_CARE_PROVIDER_SITE_OTHER): Payer: 59 | Admitting: Internal Medicine

## 2012-02-25 ENCOUNTER — Encounter: Payer: Self-pay | Admitting: Internal Medicine

## 2012-02-25 ENCOUNTER — Encounter: Payer: Self-pay | Admitting: Gastroenterology

## 2012-02-25 VITALS — BP 102/74 | HR 74 | Temp 98.0°F | Wt 118.0 lb

## 2012-02-25 DIAGNOSIS — R195 Other fecal abnormalities: Secondary | ICD-10-CM | POA: Insufficient documentation

## 2012-02-25 DIAGNOSIS — R109 Unspecified abdominal pain: Secondary | ICD-10-CM

## 2012-02-25 DIAGNOSIS — N949 Unspecified condition associated with female genital organs and menstrual cycle: Secondary | ICD-10-CM

## 2012-02-25 DIAGNOSIS — R141 Gas pain: Secondary | ICD-10-CM

## 2012-02-25 DIAGNOSIS — R140 Abdominal distension (gaseous): Secondary | ICD-10-CM

## 2012-02-25 DIAGNOSIS — N910 Primary amenorrhea: Secondary | ICD-10-CM

## 2012-02-25 MED ORDER — METRONIDAZOLE 500 MG PO TABS: 500.0000 mg | ORAL_TABLET | Freq: Three times a day (TID) | ORAL | Status: AC

## 2012-02-25 NOTE — Patient Instructions (Addendum)
Collect the stool samples Can try impaired treatment with metronidazole.  If things are not improving we should get gastroenterology consult.  Consider postinfectious irritable bowel but pain seems more sever for this.

## 2012-02-25 NOTE — Progress Notes (Signed)
  Subjective:    Patient ID: Valerie [name], female    DOB: [birth year], 46 y.o.   MRN: [id number]  HPI Patient comes in today for SDA for  new problem evaluation. Was in her usual state of health until about 3 weeks ago when she was at the beach and she had a 3 day watery nonbloody diarrheal illness. She treated with Kaopectate breath diet advanced her diet and got better. She felt that her stools were much better at that time. Then 3 days ago she's had the onset of severe migraine abdominal cramping pain. Then abnormal stools that she states are about once a day but they're small flecks of abnormal color without blood. She has occasional postprandial pain on the left upper and some discomfort in general the lower abdomen into the right. There is no vomiting fever chills unusual rashes.  Last menstrual period 6 weeks ago and due .   Hx of unsuccessful infertility rx no dx of endometriosis.   Review of Systems Neg fever with loss chills vomiting  Has low to =mid back pain described as tightness  Upper abd .  No uti sx . Bloating .  restof hpi neg or [place]   Hs of colonoscopy on 2008 . Neg gi disease in family  Med list review    Objective:   Physical Exam BP 102/74  Pulse 74  Temp 98 F (36.7 C) (Oral)  Wt 118 lb (53.524 kg)  SpO2 99%  LMP 01/03/2012 WDWN in nad Wt Readings from Last 3 Encounters:  02/25/12 118 lb (53.524 kg)  12/03/11 118 lb (53.524 kg)  03/28/11 119 lb (53.978 kg)  Neck: Supple without adenopathy or masses or bruits Chest:  Clear to A&P without wheezes rales or rhonchi CV:  S1-S2 no gallops or murmurs peripheral perfusion is normal Abdomen:  Sof,t normal bowel sounds  Present.  without hepatosplenomegaly, no guarding rebound or masses no CVA tenderness Some subjective  tenderness left upper and right lower to mid but no g or r   Skin: normal capillary refill ,turgor , color: No acute rashes ,petechiae or bruising     Assessment & Plan:  Abd cramping and abnormal  stools change in bowel habits   Hx of diarrheal illness 3 weeks ago .No obstructive  sx.  Consider post infectious related . Other  . Doesn't  sound gyne cause  but does have delayed menses.  Stool tests today and ua ucg  Then can add metronidazole  Other wise can do labs bmp cbcdiff esr  lfts and celiac 10  Panel.

## 2012-02-26 LAB — POCT URINALYSIS DIP (MANUAL ENTRY)
Ketones, POC UA: NEGATIVE
Leukocytes, UA: NEGATIVE
Nitrite, UA: NEGATIVE
Protein Ur, POC: NEGATIVE
pH, UA: 7

## 2012-02-26 LAB — CBC WITH DIFFERENTIAL/PLATELET
Basophils Relative: 0.3 % (ref 0.0–3.0)
Eosinophils Relative: 1.5 % (ref 0.0–5.0)
HCT: 38.7 % (ref 36.0–46.0)
Hemoglobin: 12.9 g/dL (ref 12.0–15.0)
MCV: 91 fl (ref 78.0–100.0)
Monocytes Absolute: 0.2 10*3/uL (ref 0.1–1.0)
Neutro Abs: 2 10*3/uL (ref 1.4–7.7)
Neutrophils Relative %: 55.7 % (ref 43.0–77.0)
RBC: 4.26 Mil/uL (ref 3.87–5.11)
WBC: 3.5 10*3/uL — ABNORMAL LOW (ref 4.5–10.5)

## 2012-02-26 LAB — HEPATIC FUNCTION PANEL
AST: 20 U/L (ref 0–37)
Total Bilirubin: 0.4 mg/dL (ref 0.3–1.2)

## 2012-02-26 LAB — CELIAC PANEL 10
Gliadin IgA: 3.6 U/mL (ref <20)
Gliadin IgG: 3.9 U/mL (ref <20)
IgA: 272 mg/dL (ref 69–380)
Tissue Transglut Ab: 5.4 U/mL (ref <20)

## 2012-02-26 LAB — BASIC METABOLIC PANEL
BUN: 13 mg/dL (ref 6–23)
Creatinine, Ser: 0.8 mg/dL (ref 0.4–1.2)
GFR: 81.7 mL/min (ref >60.00)
Potassium: 3.8 mEq/L (ref 3.5–5.1)

## 2012-02-28 LAB — GIARDIA/CRYPTOSPORIDIUM (EIA): Giardia Screen (EIA): NEGATIVE

## 2012-02-28 LAB — CLOSTRIDIUM DIFFICILE BY PCR: Toxigenic C. Difficile by PCR: NOT DETECTED

## 2012-03-04 ENCOUNTER — Other Ambulatory Visit: Payer: Self-pay | Admitting: Family Medicine

## 2012-03-04 ENCOUNTER — Telehealth: Payer: Self-pay | Admitting: Physical Medicine and Rehabilitation

## 2012-03-04 DIAGNOSIS — R195 Other fecal abnormalities: Secondary | ICD-10-CM

## 2012-03-04 DIAGNOSIS — R109 Unspecified abdominal pain: Secondary | ICD-10-CM

## 2012-03-04 DIAGNOSIS — R141 Gas pain: Secondary | ICD-10-CM

## 2012-03-23 ENCOUNTER — Ambulatory Visit: Payer: 59 | Admitting: Gastroenterology

## 2012-03-26 ENCOUNTER — Other Ambulatory Visit: Payer: 59

## 2012-04-01 ENCOUNTER — Encounter: Payer: Self-pay | Admitting: *Deleted

## 2012-04-07 ENCOUNTER — Ambulatory Visit (INDEPENDENT_AMBULATORY_CARE_PROVIDER_SITE_OTHER): Payer: 59 | Admitting: Gastroenterology

## 2012-04-07 ENCOUNTER — Other Ambulatory Visit (INDEPENDENT_AMBULATORY_CARE_PROVIDER_SITE_OTHER): Payer: 59

## 2012-04-07 ENCOUNTER — Encounter: Payer: Self-pay | Admitting: Gastroenterology

## 2012-04-07 ENCOUNTER — Ambulatory Visit: Payer: 59 | Admitting: Gastroenterology

## 2012-04-07 VITALS — BP 110/64 | HR 68 | Ht 63.0 in | Wt 117.0 lb

## 2012-04-07 DIAGNOSIS — R1013 Epigastric pain: Secondary | ICD-10-CM

## 2012-04-07 DIAGNOSIS — R195 Other fecal abnormalities: Secondary | ICD-10-CM

## 2012-04-07 LAB — LIPASE: Lipase: 36 U/L (ref 11.0–59.0)

## 2012-04-07 NOTE — Progress Notes (Signed)
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 She's tried PPI medications without improvement, has mild nausea but no emesis, no longer has diarrhea, but guaiac positive stools on Hemoccult testing. She denies previous hepatitis or pancreatitis or any history of gallbladder liver disease. Patient also denies NSAID, cigarette, or alcohol use. Recent labs of all been unremarkable including liver function test and stool culture. Family history is noncontributory.  She denies fever, chills, skin rashes, joint pains, oral stomatitis. Her appetite has remained fairly good, she's had no significant weight loss. Patient works as an Naval architect at [employer]. She denies acid reflux symptoms, melena or hematochezia. Colonoscopy was previously performed in December 2008 for rectal bleeding, and this exam was negative except for an anal skin tag.  I have reviewed this patient's present history, medical and surgical past history, allergies and medications.     ROS: The remainder of the 10 point ROS is negative...     Physical Exam: Blood pressure 110/64, pulse 60 and regular, and weight 117 pounds with a BMI of 20.73. General well developed well nourished patient in no acute distress, appearing their stated age Eyes PERRLA, no icterus, fundoscopic exam per opthamologist Skin no lesions noted Neck supple, no adenopathy, no thyroid enlargement, no tenderness Chest clear to percussion and auscultation Heart no significant murmurs, gallops or rubs noted Abdomen no hepatosplenomegaly masses or tenderness, BS normal.  Rectal inspection normal no fissures, or fistulae noted.  No masses or tenderness on digital exam. Stool guaiac negative. Extremities no acute joint lesions, edema, phlebitis or evidence of cellulitis. Neurologic patient oriented x 3, cranial  nerves intact, no focal neurologic deficits noted. Psychological mental status normal and normal affect.  Assessment and plan: This patient's abdominal pain is in the epigastric-mid abdominal area , and on slightly concerned about her outpatient guaiac positive stool. Endoscopic exam has been scheduled tomorrow for diagnostic purposes, we will check her for H. pylori, celiac disease, and other structural abnormalities. If endoscopy is unremarkable receive upper abdominal ultrasound exam. Review of her labs do show negative celiac antibodies. Stool active serum was also negative. Serum amylase and lipase ordered. The patient cannot do the endoscopic exam until next week, therefore our place her on Nexium 40 mg a day pending further evaluation. No diagnosis found.

## 2012-04-07 NOTE — Patient Instructions (Addendum)
Your physician has requested that you go to the basement for the following lab work before leaving today: Amylase, Lipase.  cc: Berniece Andreas, MD

## 2012-04-08 ENCOUNTER — Encounter: Payer: 59 | Admitting: Gastroenterology

## 2012-04-09 ENCOUNTER — Telehealth: Payer: Self-pay | Admitting: Gastroenterology

## 2012-04-09 MED ORDER — ESOMEPRAZOLE MAGNESIUM 40 MG PO CPDR: DELAYED_RELEASE_CAPSULE | ORAL | Status: DC

## 2012-04-09 NOTE — Telephone Encounter (Signed)
lmom for pt that I ordered her Nexium and she may call back if any questions.

## 2012-04-15 ENCOUNTER — Other Ambulatory Visit: Payer: Self-pay | Admitting: *Deleted

## 2012-04-15 ENCOUNTER — Telehealth: Payer: Self-pay | Admitting: *Deleted

## 2012-04-15 ENCOUNTER — Ambulatory Visit (AMBULATORY_SURGERY_CENTER): Payer: 59 | Admitting: Gastroenterology

## 2012-04-15 ENCOUNTER — Encounter: Payer: Self-pay | Admitting: Gastroenterology

## 2012-04-15 VITALS — BP 113/73 | HR 59 | Temp 97.3°F | Resp 20 | Ht 63.0 in | Wt 117.0 lb

## 2012-04-15 DIAGNOSIS — R1013 Epigastric pain: Secondary | ICD-10-CM

## 2012-04-15 MED ORDER — SODIUM CHLORIDE 0.9 % IV SOLN: 500.0000 mL | INTRAVENOUS | Status: DC

## 2012-04-15 NOTE — Patient Instructions (Addendum)

## 2012-04-15 NOTE — Telephone Encounter (Signed)
Spoke with Valerie [name] in Recovery and gave her ultrasound abd at [facility] radiology on 04/21/12 at 8:15 /8:30 AM. NPO after midnight.

## 2012-04-15 NOTE — Progress Notes (Signed)
Patient did not experience any of the following events: a burn prior to discharge; a fall within the facility; wrong site/side/patient/procedure/implant event; or a hospital transfer or hospital admission upon discharge from the facility. (G8907) Patient did not have preoperative order for IV antibiotic SSI prophylaxis. ([id number])  

## 2012-04-15 NOTE — Op Note (Signed)
[facility] [address] [place], [address]   ENDOSCOPY PROCEDURE REPORT  PATIENT: Valerie [name], Valerie [name]  MR#: [id number] BIRTHDATE: 06-Dec-1965 , 46  yrs. old GENDER: Female ENDOSCOPIST:Selinda Korzeniewski Hale Bogus, MD, Clementeen Graham REFERRED BY: Madelin Headings, M.D. PROCEDURE DATE:  04/15/2012 PROCEDURE:   EGD w/ biopsy and EGD w/ biopsy for H.pylori ASA CLASS:    Class II INDICATIONS: persistent epigastric pain despite PPI rx. MEDICATION: propofol (Diprivan) 200mg  IV TOPICAL ANESTHETIC:   Cetacaine Spray  DESCRIPTION OF PROCEDURE:   After the risks and benefits of the procedure were explained, informed consent was obtained.  The LB GIF-H180 [id number]  endoscope was introduced through the mouth  and advanced to the second portion of the duodenum .  The instrument was slowly withdrawn as the mucosa was fully examined.    The upper, middle and distal third of the esophagus were carefully inspected and no abnormalities were noted.  The z-line was well seen at the GEJ.  The endoscope was pushed into the fundus which was normal including a retroflexed view.  The antrum, gastric body, first and second part of the duodenum were unremarkable.   Duodenal biopsies and CLO Bx.  done.    Retroflexed views revealed no abnormalities.    The scope was then withdrawn from the patient and the procedure completed.  COMPLICATIONS: There were no complications.   ENDOSCOPIC IMPRESSION: 1.   Normal EGD 2.   Duodenal biopsies and CLO Bx.  done...  RECOMMENDATIONS: 1.  await biopsy results 2.  My office will arrange for you to have an abdominal ultrasound performed.    _______________________________ eSigned:  Mardella Layman, MD, [facility] 04/15/2012 4:36 PM

## 2012-04-16 ENCOUNTER — Telehealth: Payer: Self-pay | Admitting: *Deleted

## 2012-04-16 NOTE — Telephone Encounter (Signed)
  Follow up Call-  Call back number 04/15/2012  Post procedure Call Back phone  # [phone]  Permission to leave phone message Yes     Patient questions:  Do you have a fever, pain , or abdominal swelling? no Pain Score  0 *  Have you tolerated food without any problems? yes  Have you been able to return to your normal activities? yes  Do you have any questions about your discharge instructions: Diet   no Medications  no Follow up visit  no  Do you have questions or concerns about your Care? no  Actions: * If pain score is 4 or above: No action needed, pain <4.

## 2012-04-17 ENCOUNTER — Encounter: Payer: Self-pay | Admitting: Gastroenterology

## 2012-04-21 ENCOUNTER — Other Ambulatory Visit (HOSPITAL_COMMUNITY): Payer: 59

## 2012-04-24 ENCOUNTER — Encounter: Payer: Self-pay | Admitting: Gastroenterology

## 2012-04-27 ENCOUNTER — Ambulatory Visit (HOSPITAL_COMMUNITY)
Admission: RE | Admit: 2012-04-27 | Discharge: 2012-04-27 | Disposition: A | Discharge status: Home / Self Care | Payer: 59 | Source: Ambulatory Visit | Attending: Gastroenterology | Admitting: Gastroenterology

## 2012-04-27 DIAGNOSIS — R1013 Epigastric pain: Secondary | ICD-10-CM | POA: Insufficient documentation

## 2012-04-30 ENCOUNTER — Telehealth: Payer: Self-pay | Admitting: Gastroenterology

## 2012-04-30 NOTE — Telephone Encounter (Signed)
Informed pt her U/S was normal as was the path for her EGD. Pt stated understanding and requested a copy of both; mailed.

## 2012-07-15 ENCOUNTER — Ambulatory Visit (INDEPENDENT_AMBULATORY_CARE_PROVIDER_SITE_OTHER): Payer: 59 | Admitting: Internal Medicine

## 2012-07-15 ENCOUNTER — Encounter: Payer: Self-pay | Admitting: Internal Medicine

## 2012-07-15 VITALS — BP 108/72 | HR 71 | Temp 98.1°F | Wt 120.0 lb

## 2012-07-15 DIAGNOSIS — R519 Headache, unspecified: Secondary | ICD-10-CM | POA: Insufficient documentation

## 2012-07-15 DIAGNOSIS — R51 Headache: Secondary | ICD-10-CM | POA: Insufficient documentation

## 2012-07-15 DIAGNOSIS — J309 Allergic rhinitis, unspecified: Secondary | ICD-10-CM

## 2012-07-15 DIAGNOSIS — J329 Chronic sinusitis, unspecified: Secondary | ICD-10-CM | POA: Insufficient documentation

## 2012-07-15 MED ORDER — PREDNISONE 20 MG PO TABS: ORAL_TABLET | ORAL | Status: DC

## 2012-07-15 MED ORDER — AMOXICILLIN 500 MG PO CAPS: 500.0000 mg | ORAL_CAPSULE | Freq: Three times a day (TID) | ORAL | Status: DC

## 2012-07-15 NOTE — Progress Notes (Signed)
Chief Complaint  Patient presents with  . Headache    Says she feels "stuffy."  Ongoing since Thursday.  Treating with Tylenol and Benadryl.  . Dizziness  . Facial Pain    HPI: Chief Complaint  Patient presents with  . Headache    Says she feels "stuffy."  Ongoing since Thursday.  Treating with Tylenol and Benadryl.  . Dizziness  . Facial Pain   Patient comes in today for SDA for  new problem evaluation.  Equilibrium feeling issue head is off  and had vision concern but no lov or scotoma  But went to eye doc ; said nl exam.   now has had a HA that has been waxing and waning since last week ; feels like head fullness question sinus infection initial symptoms could have been from weather change got better and then recurred 3 days ago .    Eye hurts. Left And stuffy. Nose but no drainage Left and scalp sensitivity . tyelnol help in the past  This time takes bendrl y for help. Helps her allergies some Stuffy but not drainage.    No fever.  Chills fever no cough , no syncope. Still has midcycle spotting has had a full valuation an endometrial biopsy for this. She has a history of significant allergies and is been done much better since on allergy shots she is no longer on any nasal cortisone however for this time of year.   ROS: See pertinent positives and negatives per HPI. No nausea vomiting focal weakness numbness other neurologic signs.  Past Medical History  Diagnosis Date  . Anemia     NOS  . GERD (gastroesophageal reflux disease)   . HSV infection     labialis  . 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 Allergy     cats    Family History  Problem Relation Age of Onset  . Colon polyps Brother     elev TG  . Alcohol abuse Other   . Breast cancer Maternal Aunt   . Diabetes Father   . Hyperlipidemia Father   . Heart disease Father   . Stroke Maternal Grandmother   . Diabetes Maternal Grandmother   . Heart disease Maternal Grandmother   . Diabetes Maternal  Grandfather   . Hyperlipidemia Brother     History   Social History  . Marital Status: Married    Spouse Name: N/A    Number of Children: N/A  . Years of Education: N/A   Occupational History  . PA [employer]   Social History Main Topics  . Smoking status: Never Smoker   . Smokeless tobacco: Never Used  . Alcohol Use: No  . Drug Use: No  . Sexually Active: None   Other Topics Concern  . 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 b complex vitamins capsule Take 1 capsule by mouth daily.        . cetirizine (ZYRTEC) 10 MG tablet Take 10 mg by mouth daily as needed.       . fish oil-omega-3 fatty acids 1000 MG capsule Take 1 g by mouth daily.        . multivitamin (THERAGRAN) per tablet Take 1 tablet by mouth daily.        Marland Kitchen  NON FORMULARY Allergy injections       . amoxicillin (AMOXIL) 500 MG capsule Take 1 capsule (500 mg total) by mouth 3 (three) times daily.  30 capsule  0  . esomeprazole (NEXIUM) 40 MG capsule Take one capsule by mouth 30 minutes before breakfast daily.  30 capsule  11  . folic acid (FOLVITE) 400 MCG tablet Take 400 mcg by mouth daily.      . predniSONE (DELTASONE) 20 MG tablet Take 3 po qd for 2 days then 2 po qd for 3 days,or as directed  12 tablet  0    EXAM:  BP 108/72  Pulse 71  Temp 98.1 F (36.7 C) (Oral)  Wt 120 lb (54.432 kg)  SpO2 98%  LMP 06/29/2012  There is no height on file to calculate BMI.  GENERAL: vitals reviewed and listed above, alert, oriented, appears well hydrated and in no acute distress looks mildly allergic but not really congested  HEENT: atraumatic, conjunctiva  clear, no obvious abnormalities on inspection of external nose and ears OP : no lesion edema or exudate TMs are clear OP some  cobblestoning no drainage nares +2 turbinates face slightly tender on the left maxillary left frontal area eyes PERRLA EOMs are full  NECK: no obvious masses on inspection palpation no adenopathy LUNGS: clear to auscultation bilaterally, no wheezes, rales or rhonchi, good air movement  CV: HRRR, no clubbing cyanosis or  peripheral edema nl cap refill   MS: moves all extremities without noticeable focal  abnormality Neurologic exam DTRs are present cranial nerves III through XII appear intact gait is within normal limits. No unusual rashes PSYCH: pleasant and cooperative, no obvious depression or anxiety  ASSESSMENT AND PLAN:  Discussed the following assessment and plan:  1. Headache    Possibly secondary from allergic sinus disease versus a migraine. With weather change and allergies no obvious neurologic finding  2. ALLERGIC RHINITIS   3. Sinusitis possible    Consider allergic cause but can treat for bacterial over the holiday if needed   trial of 5 days of prednisone and get on her nasal cortisone can add amoxicillin 3 times a day for the possibility of bacterial sinusitis. Other etiologies if not improving.  -Patient advised to return or notify health care team  immediately if symptoms worsen or persist or new concerns arise.  Patient Instructions  Treat for sinusitis  bact and allergic and or migraine component .  Expect improvement with in 3-5 days either way.   Neta Mends. Traeger Sultana M.D.

## 2012-07-15 NOTE — Patient Instructions (Signed)
Treat for sinusitis  bact and allergic and or migraine component .  Expect improvement with in 3-5 days either way.

## 2012-08-21 ENCOUNTER — Encounter (HOSPITAL_COMMUNITY): Payer: Self-pay | Admitting: Pharmacist

## 2012-08-21 ENCOUNTER — Other Ambulatory Visit: Payer: Self-pay | Admitting: Obstetrics & Gynecology

## 2012-08-27 ENCOUNTER — Encounter (HOSPITAL_COMMUNITY)
Admission: RE | Disposition: A | Discharge status: Home / Self Care | Payer: Self-pay | Source: Ambulatory Visit | Attending: Obstetrics & Gynecology

## 2012-08-27 ENCOUNTER — Encounter (HOSPITAL_COMMUNITY): Payer: Self-pay | Admitting: Anesthesiology

## 2012-08-27 ENCOUNTER — Ambulatory Visit (HOSPITAL_COMMUNITY)
Admission: RE | Admit: 2012-08-27 | Discharge: 2012-08-27 | Disposition: A | Discharge status: Home / Self Care | Payer: 59 | Source: Ambulatory Visit | Attending: Obstetrics & Gynecology | Admitting: Obstetrics & Gynecology

## 2012-08-27 ENCOUNTER — Ambulatory Visit (HOSPITAL_COMMUNITY): Payer: 59 | Admitting: Anesthesiology

## 2012-08-27 DIAGNOSIS — N840 Polyp of corpus uteri: Secondary | ICD-10-CM | POA: Insufficient documentation

## 2012-08-27 DIAGNOSIS — N920 Excessive and frequent menstruation with regular cycle: Secondary | ICD-10-CM | POA: Insufficient documentation

## 2012-08-27 HISTORY — PX: DILITATION & CURRETTAGE/HYSTROSCOPY WITH VERSAPOINT RESECTION: SHX5571

## 2012-08-27 LAB — CBC
HCT: 37.8 % (ref 36.0–46.0)
MCH: 30.5 pg (ref 26.0–34.0)
MCHC: 34.1 g/dL (ref 30.0–36.0)
MCV: 89.4 fL (ref 78.0–100.0)
Platelets: 163 10*3/uL (ref 150–400)
RDW: 11.9 % (ref 11.5–15.5)
WBC: 3.7 10*3/uL — ABNORMAL LOW (ref 4.0–10.5)

## 2012-08-27 SURGERY — DILATATION & CURETTAGE/HYSTEROSCOPY WITH VERSAPOINT RESECTION
Anesthesia: General | Site: Vagina | Wound class: Clean Contaminated

## 2012-08-27 MED ORDER — ACETAMINOPHEN 325 MG PO TABS
ORAL_TABLET | ORAL | Status: AC
  Administered 2012-08-27: 650 mg via ORAL
  Filled 2012-08-27: qty 2

## 2012-08-27 MED ORDER — METRONIDAZOLE IN NACL 5-0.79 MG/ML-% IV SOLN
500.0000 mg | Freq: Once | INTRAVENOUS | Status: AC
  Administered 2012-08-27: 500 g via INTRAVENOUS
  Filled 2012-08-27: qty 100

## 2012-08-27 MED ORDER — LACTATED RINGERS IV SOLN
INTRAVENOUS | Status: DC
  Administered 2012-08-27 (×2): via INTRAVENOUS

## 2012-08-27 MED ORDER — PROPOFOL 10 MG/ML IV EMUL
INTRAVENOUS | Status: DC | PRN
  Administered 2012-08-27: 100 mg via INTRAVENOUS

## 2012-08-27 MED ORDER — ONDANSETRON HCL 4 MG/2ML IJ SOLN
INTRAMUSCULAR | Status: DC | PRN
  Administered 2012-08-27: 4 mg via INTRAVENOUS

## 2012-08-27 MED ORDER — CHLOROPROCAINE HCL 1 % IJ SOLN
INTRAMUSCULAR | Status: DC | PRN
  Administered 2012-08-27: 20 mL

## 2012-08-27 MED ORDER — ONDANSETRON HCL 4 MG/2ML IJ SOLN
INTRAMUSCULAR | Status: AC
  Filled 2012-08-27: qty 2

## 2012-08-27 MED ORDER — LIDOCAINE HCL (CARDIAC) 20 MG/ML IV SOLN
INTRAVENOUS | Status: AC
  Filled 2012-08-27: qty 5

## 2012-08-27 MED ORDER — FENTANYL CITRATE 0.05 MG/ML IJ SOLN
INTRAMUSCULAR | Status: DC | PRN
  Administered 2012-08-27 (×2): 50 ug via INTRAVENOUS

## 2012-08-27 MED ORDER — LIDOCAINE HCL (CARDIAC) 20 MG/ML IV SOLN
INTRAVENOUS | Status: DC | PRN
  Administered 2012-08-27: 20 mg via INTRAVENOUS
  Administered 2012-08-27: 30 mg via INTRAVENOUS

## 2012-08-27 MED ORDER — PROPOFOL 10 MG/ML IV EMUL
INTRAVENOUS | Status: AC
  Filled 2012-08-27: qty 20

## 2012-08-27 MED ORDER — MIDAZOLAM HCL 2 MG/2ML IJ SOLN: 0.5000 mg | Freq: Once | INTRAMUSCULAR | Status: DC | PRN

## 2012-08-27 MED ORDER — CHLOROPROCAINE HCL 1 % IJ SOLN
INTRAMUSCULAR | Status: AC
  Filled 2012-08-27: qty 30

## 2012-08-27 MED ORDER — MEPERIDINE HCL 25 MG/ML IJ SOLN: 6.2500 mg | INTRAMUSCULAR | Status: DC | PRN

## 2012-08-27 MED ORDER — OXYCODONE-ACETAMINOPHEN 7.5-325 MG PO TABS: 1.0000 | ORAL_TABLET | Freq: Four times a day (QID) | ORAL | Status: DC | PRN

## 2012-08-27 MED ORDER — FENTANYL CITRATE 0.05 MG/ML IJ SOLN: 25.0000 ug | INTRAMUSCULAR | Status: DC | PRN

## 2012-08-27 MED ORDER — PROMETHAZINE HCL 25 MG/ML IJ SOLN: 6.2500 mg | INTRAMUSCULAR | Status: DC | PRN

## 2012-08-27 MED ORDER — MIDAZOLAM HCL 5 MG/5ML IJ SOLN
INTRAMUSCULAR | Status: DC | PRN
  Administered 2012-08-27: 1 mg via INTRAVENOUS

## 2012-08-27 MED ORDER — FENTANYL CITRATE 0.05 MG/ML IJ SOLN
INTRAMUSCULAR | Status: AC
  Filled 2012-08-27: qty 5

## 2012-08-27 MED ORDER — METRONIDAZOLE IN NACL 5-0.79 MG/ML-% IV SOLN
500.0000 mg | Freq: Three times a day (TID) | INTRAVENOUS | Status: DC
  Filled 2012-08-27 (×6): qty 100

## 2012-08-27 MED ORDER — ACETAMINOPHEN 325 MG PO TABS
650.0000 mg | ORAL_TABLET | Freq: Once | ORAL | Status: AC
  Administered 2012-08-27: 650 mg via ORAL

## 2012-08-27 SURGICAL SUPPLY — 22 items
BLADE INCISOR TRUC PLUS 2.9 (ABLATOR) IMPLANT
CANISTERS HI-FLOW 3000CC (CANNISTER) ×4 IMPLANT
CATH ROBINSON RED A/P 16FR (CATHETERS) ×3 IMPLANT
CLOTH BEACON ORANGE TIMEOUT ST (SAFETY) ×3 IMPLANT
CONTAINER PREFILL 10% NBF 60ML (FORM) ×4 IMPLANT
DRAPE HYSTEROSCOPY (DRAPE) ×3 IMPLANT
DRESSING TELFA 8X3 (GAUZE/BANDAGES/DRESSINGS) ×3 IMPLANT
ELECTRODE RT ANGLE VERSAPOINT (CUTTING LOOP) ×2 IMPLANT
GLOVE BIO SURGEON STRL SZ 6.5 (GLOVE) ×6 IMPLANT
GLOVE BIOGEL PI IND STRL 7.0 (GLOVE) ×2 IMPLANT
GLOVE BIOGEL PI INDICATOR 7.0 (GLOVE) ×1
GOWN STRL REIN XL XLG (GOWN DISPOSABLE) ×6 IMPLANT
INCISOR TRUC PLUS BLADE 2.9 (ABLATOR)
KIT HYSTEROSCOPY TRUCLEAR (ABLATOR) IMPLANT
MORCELLATOR RECIP TRUCLEAR 4.0 (ABLATOR) IMPLANT
NDL SPNL 22GX3.5 QUINCKE BK (NEEDLE) ×1 IMPLANT
NEEDLE SPNL 22GX3.5 QUINCKE BK (NEEDLE) ×3 IMPLANT
PACK VAGINAL MINOR WOMEN LF (CUSTOM PROCEDURE TRAY) ×3 IMPLANT
PAD OB MATERNITY 4.3X12.25 (PERSONAL CARE ITEMS) ×3 IMPLANT
SYR CONTROL 10ML LL (SYRINGE) ×3 IMPLANT
TOWEL OR 17X24 6PK STRL BLUE (TOWEL DISPOSABLE) ×6 IMPLANT
WATER STERILE IRR 1000ML POUR (IV SOLUTION) ×3 IMPLANT

## 2012-08-27 NOTE — Transfer of Care (Signed)
Immediate Anesthesia Transfer of Care Note  Patient: Valerie [name]  Procedure(s) Performed: Procedure(s) (LRB) with comments: DILATATION & CURETTAGE/HYSTEROSCOPY WITH VERSAPOINT RESECTION ()  Patient Location: PACU  Anesthesia Type:General  Level of Consciousness: sedated  Airway & Oxygen Therapy: Patient Spontanous Breathing and Patient connected to nasal cannula oxygen  Post-op Assessment: Report given to PACU RN and Post -op Vital signs reviewed and stable  Post vital signs: stable  Complications: No apparent anesthesia complications

## 2012-08-27 NOTE — Addendum Note (Signed)
Addendum  created 08/27/12 1830 by Zailen Albarran, MD   Modules edited:Anesthesia Responsible Staff    

## 2012-08-27 NOTE — Anesthesia Postprocedure Evaluation (Signed)
Anesthesia Post Note  Patient: Valerie [name]  Procedure(s) Performed: Procedure(s) (LRB): DILATATION & CURETTAGE/HYSTEROSCOPY WITH VERSAPOINT RESECTION ()  Anesthesia type: General  Patient location: PACU  Post pain: Pain level controlled  Post assessment: Post-op Vital signs reviewed  Last Vitals:  Filed Vitals:   08/27/12 1700  BP: 120/73  Pulse: 55  Temp: 36.5 C  Resp: 20    Post vital signs: Reviewed  Level of consciousness: sedated  Complications: No apparent anesthesia complications

## 2012-08-27 NOTE — Discharge Summary (Signed)
  Physician Discharge Summary  Patient ID: Valerie [name] MRN: [id number] DOB/AGE: 10/10/1965 47 y.o.  Admit date: 08/27/2012 Discharge date: 08/27/2012  Admission Diagnoses: Menometrorrhagia, Endometrial Polyp  Discharge Diagnoses: Menometrorrhagia, Endometrial Polyp        Active Problems:  * No active hospital problems. *    Discharged Condition: good  Hospital Course: Outpatient  Consults: None  Treatments: surgery: Hysteroscopy with resection and D+C  Disposition: 01-Home or Self Care     Medication List     As of 08/27/2012  3:56 PM    TAKE these medications         b complex vitamins capsule   Take 1 capsule by mouth daily.      diphenhydrAMINE 25 MG tablet   Commonly known as: BENADRYL   Take 25 mg by mouth every 6 (six) hours as needed. For post-nasal drip      Fish Oil 1200 MG Caps   Take 1,200 mg by mouth daily.      levocetirizine 5 MG tablet   Commonly known as: XYZAL   Take 5 mg by mouth daily as needed. For post-nasal drip      multivitamin with minerals Tabs   Take 1 tablet by mouth daily.      oxyCODONE-acetaminophen 7.5-325 MG per tablet   Commonly known as: PERCOCET   Take 1 tablet by mouth every 6 (six) hours as needed for pain.      PRESCRIPTION MEDICATION   Inject 1 each into the skin once a week. Allergy injections           Follow-up Information    Follow up with Shenee Wignall,MARIE-LYNE, MD. In 3 weeks.   Contact information:   [address] [place] [address] [phone]          Signed: Genia Del, MD 08/27/2012, 3:56 PM

## 2012-08-27 NOTE — Addendum Note (Signed)
Addendum  created 08/27/12 1830 by Dana Allan, MD   Modules edited:Anesthesia Responsible Staff

## 2012-08-27 NOTE — H&P (Signed)
Valerie [name] is an 47 y.o. female Infertility  RP:  Menometro, endometrial polyp for HSC, resection, D+C  Pertinent Gynecological History: Menses: flow is excessive with use of many pads or tampons on heaviest days Bleeding: intermenstrual bleeding Contraception: none Blood transfusions: none Sexually transmitted diseases: HSV labialis  Last mammogram: normal  Last pap: normal  OB History: No obstetric history on file.   Menstrual History: No LMP recorded.    Past Medical History  Diagnosis Date  . Anemia     NOS  . GERD (gastroesophageal reflux disease)   . HSV infection     labialis  . 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 Allergy     cats    Past Surgical History  Procedure Date  . Lasik 2009    mono vision    Family History  Problem Relation Age of Onset  . Colon polyps Brother     elev TG  . Alcohol abuse Other   . Breast cancer Maternal Aunt   . Diabetes Father   . Hyperlipidemia Father   . Heart disease Father   . Stroke Maternal Grandmother   . Diabetes Maternal Grandmother   . Heart disease Maternal Grandmother   . Diabetes Maternal Grandfather   . Hyperlipidemia Brother     Social History:  reports that she has never smoked. She has never used smokeless tobacco. She reports that she does not drink alcohol or use illicit drugs.  Allergies:  Allergies  Allergen Reactions  . Amoxicillin-Pot Clavulanate Diarrhea  . Ceftin (Cefuroxime Axetil) Other (See Comments)    Severe yeast infection  . Clarithromycin Other (See Comments)    Significant metallic taste  . Nsaids Nausea Only and Other (See Comments)    Abdominal pain / severe reflux    Prescriptions prior to admission  Medication Sig Dispense Refill  . b complex vitamins capsule Take 1 capsule by mouth daily.       . diphenhydrAMINE (BENADRYL) 25 MG tablet Take 25 mg by mouth every 6 (six) hours as needed. For post-nasal drip      . levocetirizine (XYZAL) 5 MG tablet Take 5 mg  by mouth daily as needed. For post-nasal drip      . Multiple Vitamin (MULTIVITAMIN WITH MINERALS) TABS Take 1 tablet by mouth daily.      . Omega-3 Fatty Acids (FISH OIL) 1200 MG CAPS Take 1,200 mg by mouth daily.      Marland Kitchen PRESCRIPTION MEDICATION Inject 1 each into the skin once a week. Allergy injections        Blood pressure 105/46, pulse 60, temperature 97.5 F (36.4 C), temperature source Oral, resp. rate 18, height 5' 3.5" (1.613 m), weight 54.432 kg (120 lb), SpO2 100.00%.  [place]:  Thickening at fundus.   Results for orders placed during the hospital encounter of 08/27/12 (from the past 24 hour(s))  PREGNANCY, URINE     Status: Normal   Collection Time   08/27/12 10:35 AM      Component Value Range   Preg Test, Ur NEGATIVE  NEGATIVE  CBC     Status: Abnormal   Collection Time   08/27/12 10:42 AM      Component Value Range   WBC 3.7 (*) 4.0 - 10.5 K/uL   RBC 4.23  3.87 - 5.11 MIL/uL   Hemoglobin 12.9  12.0 - 15.0 g/dL   HCT 16.1  09.6 - 04.5 %   MCV 89.4  78.0 -  100.0 fL   MCH 30.5  26.0 - 34.0 pg   MCHC 34.1  30.0 - 36.0 g/dL   RDW 16.1  09.6 - 04.5 %   Platelets 163  150 - 400 K/uL    No results found.  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  Surgery and risks reviewed.  Aydn Ferrara,MARIE-LYNE 08/27/2012, 1:53 PM

## 2012-08-27 NOTE — Anesthesia Preprocedure Evaluation (Signed)
Anesthesia Evaluation  Patient identified by MRN, date of birth, ID band Patient awake    Reviewed: Allergy & Precautions, H&P , NPO status , Patient's Chart, lab work & pertinent test results, reviewed documented beta blocker date and time   History of Anesthesia Complications Negative for: history of anesthetic complications  Airway Mallampati: II      Dental  (+) Teeth Intact   Pulmonary neg pulmonary ROS,  breath sounds clear to auscultation  Pulmonary exam normal       Cardiovascular Rhythm:regular Rate:Normal     Neuro/Psych  Headaches, negative neurological ROS  negative psych ROS   GI/Hepatic negative GI ROS, Neg liver ROS, GERD-  ,  Endo/Other  negative endocrine ROS  Renal/GU      Musculoskeletal   Abdominal   Peds  Hematology negative hematology ROS (+)   Anesthesia Other Findings Anemia   NOS GERD (gastroesophageal reflux disease)        HSV infection   labialis Female fertility problem   hx of treatment    Allergy   cats    Reproductive/Obstetrics negative OB ROS                           Anesthesia Physical Anesthesia Plan  ASA: II  Anesthesia Plan: General LMA   Post-op Pain Management:    Induction:   Airway Management Planned:   Additional Equipment:   Intra-op Plan:   Post-operative Plan:   Informed Consent: I have reviewed the patients History and Physical, chart, labs and discussed the procedure including the risks, benefits and alternatives for the proposed anesthesia with the patient or authorized representative who has indicated his/her understanding and acceptance.   Dental Advisory Given  Plan Discussed with: CRNA and Surgeon  Anesthesia Plan Comments:         Anesthesia Quick Evaluation

## 2012-08-27 NOTE — Op Note (Signed)
08/27/2012  3:58 PM  PATIENT:  Valerie [name]  47 y.o. female  PRE-OPERATIVE DIAGNOSIS:  Menometrorrhagia, Endometrial Polyp  POST-OPERATIVE DIAGNOSIS:  Menometrorrhagia, Endometrial Polyp  PROCEDURE:  Procedure(s): DILATATION & CURETTAGE/HYSTEROSCOPY WITH VERSAPOINT RESECTION  SURGEON:  Surgeon(s): Genia Del, MD  ASSISTANTS: none   ANESTHESIA:   general  PROCEDURE:  Under general anesthesia with endotracheal intubation, the patient is in lithotomy position.  She is prepped with Betadine on the suprapubic, vulvar and vaginal areas.  The vaginal exam reveals an anteverted uterus, normal volume, no adnexal mass.  The speculum was introduced and the vagina. The anterior lip of the cervix is grasped with a tenaculum. Dilation of the cervix with Hegar dilators up to #31 without difficulty. We then introduced the operative hysteroscope with the VersaPoint in the intrauterine cavity.  Pictures were taken. Both ostia were identified. A small polyp was present on the left posterior aspect of the intrauterine cavity. Resection of the polyp. We then removed the hysteroscope. Proceeded with a systematic curettage of the intrauterine cavity on all surfaces with a sharp curet. Both the resection and curetting specimens were sent to pathology together.  The cavity was visualized with the hysteroscope once again. Hemostasis was adequate. No lesion was present. Pictures were taken. All instruments were removed. 2 figure-of-eight were done on the anterior lip of the cervix with a chromic 2-0 to control hemostasis from a small tear caused by the tenaculum.  The speculum was removed. The patient was brought to recovery room in good and stable status.  ESTIMATED BLOOD LOSS: 10 cc  Fluid deficit: 45 cc   Intake/Output Summary (Last 24 hours) at 08/27/12 1558 Last data filed at 08/27/12 1543  Gross per 24 hour  Intake   1000 ml  Output     30 ml  Net    970 ml     BLOOD ADMINISTERED:none   LOCAL  MEDICATIONS USED:  Nesacaine 1 %  SPECIMEN:  Source of Specimen:  Resection of endometrial polyp and endometrial curettings  DISPOSITION OF SPECIMEN:  PATHOLOGY  COUNTS:  YES  PLAN OF CARE: Transfer to PACU    Marie-Lyne Terrez Ander  08/27/2012  At 4 pm

## 2012-08-28 ENCOUNTER — Encounter (HOSPITAL_COMMUNITY): Payer: Self-pay | Admitting: Obstetrics & Gynecology

## 2012-10-27 ENCOUNTER — Encounter: Payer: Self-pay | Admitting: Internal Medicine

## 2012-10-27 ENCOUNTER — Ambulatory Visit (INDEPENDENT_AMBULATORY_CARE_PROVIDER_SITE_OTHER): Payer: 59 | Admitting: Internal Medicine

## 2012-10-27 VITALS — BP 140/84 | HR 62 | Temp 98.1°F | Wt 123.0 lb

## 2012-10-27 DIAGNOSIS — M542 Cervicalgia: Secondary | ICD-10-CM | POA: Insufficient documentation

## 2012-10-27 MED ORDER — METHOCARBAMOL 750 MG PO TABS: ORAL_TABLET | ORAL | Status: DC

## 2012-10-27 NOTE — Progress Notes (Signed)
Chief Complaint  Patient presents with  . Dizziness    Pt wonders if the dizziness is connected to the neck and shoulder pain.    HPI: Patient comes in today for SDA for  new problem evaluation. Over the last number of months he's noted increasing frequency of a concerning symptom. Feels like a hypochondriac but is uncertain what this could be concerned about related headaches question if getting spinal stenosis that could cause her symptoms.  Feeling of  diorientation   or an imbalance feeling Had eye check and was told her exam is okay ok   Has had shoulder pain and neck ain MS pain and more now .  Tight.   Did PT   And didn't help  Told to get a massage.  Some on own  5 # weight    For upper body strength.   Feels like has to his equilibrium not vertigo.   On and off for a few years  Worse with fatigue.  Clearance Coots things have become more frequent.   She did try the Flexeril given to her by another physician but made her too groggy the next day she's not taking a muscle relaxant this time I would like to try a less sedating 1. No head trauma. Has a remote history of an MRI of the head done for atypical headaches facial pain this was over 3 years ago. Work  About the same  Sleep ok.     No  Antihistamines    see if it got better although she is going to be evaluated for allergies.  No falling.  No associated symptoms no weakness in extremities ROS: See pertinent positives and negatives per HPI. Denies numbness problems walking with her feet. Surgery 1 14 for  uterine problem leiomyoma no cancer Past Medical History  Diagnosis Date  . Anemia     NOS  . GERD (gastroesophageal reflux disease)   . HSV infection     labialis  . 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 Allergy     cats    Family History  Problem Relation Age of Onset  . Colon polyps Brother     elev TG  . Alcohol abuse Other   . Breast cancer Maternal Aunt   . Diabetes Father   . Hyperlipidemia Father   .  Heart disease Father   . Stroke Maternal Grandmother   . Diabetes Maternal Grandmother   . Heart disease Maternal Grandmother   . Diabetes Maternal Grandfather   . Hyperlipidemia Brother     History   Social History  . Marital Status: Married    Spouse Name: N/A    Number of Children: N/A  . Years of Education: N/A   Occupational History  . 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 Smoking status: Never Smoker   . Smokeless tobacco: Never Used  . Alcohol Use: No  . Drug Use: No  . Sexually Active: None   Other Topics Concern  . 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 b complex vitamins capsule Take 1 capsule by mouth daily.       Marland Kitchen  diphenhydrAMINE (BENADRYL) 25 MG tablet Take 25 mg by mouth every 6 (six) hours as needed. For post-nasal drip      . levocetirizine (XYZAL) 5 MG tablet Take 5 mg by mouth daily as needed. For post-nasal drip      . Methylsulfonylmethane (MSM) 1000 MG CAPS Take by mouth.      . Multiple Vitamin (MULTIVITAMIN WITH MINERALS) TABS Take 1 tablet by mouth daily.      . Omega-3 Fatty Acids (FISH OIL) 1200 MG CAPS Take 1,200 mg by mouth daily.      Marland Kitchen PRESCRIPTION MEDICATION Inject 1 each into the skin once a week. Allergy injections      . methocarbamol (ROBAXIN-750) 750 MG tablet 1 po hs for muscle spasm and  up to  4 x per day prn.  40 tablet  1  . [DISCONTINUED] oxyCODONE-acetaminophen (PERCOCET) 7.5-325 MG per tablet Take 1 tablet by mouth every 6 (six) hours as needed for pain.  10 tablet  0   No facility-administered encounter medications on file as of 10/27/2012.    EXAM:  BP 140/84  Pulse 62  Temp(Src) 98.1 F (36.7 C) (Oral)  Wt 123 lb (55.792 kg)  BMI 21.44 kg/m2  SpO2 98%  LMP  10/19/2012  Body mass index is 21.44 kg/(m^2).  GENERAL: vitals reviewed and listed above, alert, oriented, appears well hydrated and in no acute distress  HEENT: atraumatic, conjunctiva  clear, no obvious abnormalities on inspection of external nose and ears OP : no lesion edema or exudate   NECK: no obvious masses on inspection palpation  no midline tenderness she does have a tight trapezius. No fasciculations  LUNGS: clear to auscultation bilaterally, no wheezes, rales or rhonchi, good air movement  CV: HRRR, no clubbing cyanosis or  peripheral edema nl cap refill  NEURO: oriented x 3 CN 3-12 appear intact. No focal muscle weakness or atrophy. DTRs symmetrical. Gait WNL.  Grossly non focal. No tremor or abnormal movement. Heel to toe looks normal negative Romberg negative tremor.   MS: moves all extremities without noticeable focal  abnormality  PSYCH: pleasant and cooperative, no obvious depression or anxiety  ASSESSMENT AND PLAN:  Discussed the following assessment and plan:  Imbalance - Disequilibrium feeling mostly intermittent with certain motions nonfocal exam getting worse  Headache  Neck pain - With secondary spasm no arm neurologic symptoms at this time Uncertain cause of her symptom complex although I think her neck is more musculoskeletal mechanical in nature. The intermittent more frequent imbalance feeling  And sometimes headache  is a bit more concerning although it is reassuring that her exam is normal. I suppose it allergic rhinitis sinusitis could cause some of the symptoms. This doesn't sound like vertigo. Discussed options followup neurologic consultation etc. at this time we'll do an MRI with contrast and follow her carefully unless referral needed because of results. We can followup after she gets allergy evaluation also.  In regard to the neck muscle continue her exercises etc. we can try a less sedating muscle relaxant Robaxin at night occasionally in the  day. -Patient advised to return or notify health care team  if symptoms worsen or persist or new concerns arise.  Patient Instructions  Can try the muscle relaxant   As needed.    Will contact you about mri brain scan.    Then plan follow up  After this is back  And after allergy evaluation.  Continue exercises as discussed for now.  Standley Brooking. Panosh M.D.

## 2012-10-27 NOTE — Patient Instructions (Signed)
Can try the muscle relaxant   As needed.    Will contact you about mri brain scan.    Then plan follow up  After this is back  And after allergy evaluation.  Continue exercises as discussed for now.

## 2012-10-31 ENCOUNTER — Ambulatory Visit (HOSPITAL_COMMUNITY)
Admission: RE | Admit: 2012-10-31 | Discharge: 2012-10-31 | Disposition: A | Discharge status: Home / Self Care | Payer: 59 | Source: Ambulatory Visit | Attending: Internal Medicine | Admitting: Internal Medicine

## 2012-10-31 DIAGNOSIS — R269 Unspecified abnormalities of gait and mobility: Secondary | ICD-10-CM | POA: Insufficient documentation

## 2012-10-31 DIAGNOSIS — R279 Unspecified lack of coordination: Secondary | ICD-10-CM | POA: Insufficient documentation

## 2012-10-31 DIAGNOSIS — M542 Cervicalgia: Secondary | ICD-10-CM | POA: Insufficient documentation

## 2012-10-31 DIAGNOSIS — R51 Headache: Secondary | ICD-10-CM | POA: Insufficient documentation

## 2012-10-31 MED ORDER — GADOBENATE DIMEGLUMINE 529 MG/ML IV SOLN
11.0000 mL | Freq: Once | INTRAVENOUS | Status: AC | PRN
  Administered 2012-10-31: 11 mL via INTRAVENOUS

## 2013-01-18 ENCOUNTER — Telehealth: Payer: Self-pay | Admitting: Internal Medicine

## 2013-01-18 NOTE — Telephone Encounter (Signed)
Pt has little puncture wounds on legs from gardening shears. Did not bleed a lot. Wants to know if up to date on tetanus? Should she be taking abx? She knows Dr Fabian Sharp is not in office. Please advise and call.

## 2013-01-19 NOTE — Telephone Encounter (Signed)
Patient is up to date on Td.  Left message at below listed number.

## 2013-01-19 NOTE — Telephone Encounter (Signed)
Spoke to the pt.  Notified her that she is up to date on TD.  Asked her about puncture wounds.  She said they looked fine.  Have no sign of infection.  She has been using antibiotic ointment (OTC) and cleaning them.  Instructed her to continue and call back if oozing or pus, fever, redness or discoloration etc around wound should appear.

## 2013-03-24 ENCOUNTER — Ambulatory Visit (INDEPENDENT_AMBULATORY_CARE_PROVIDER_SITE_OTHER): Payer: 59 | Admitting: Internal Medicine

## 2013-03-24 ENCOUNTER — Encounter: Payer: Self-pay | Admitting: Internal Medicine

## 2013-03-24 VITALS — BP 108/76 | HR 66 | Temp 98.0°F | Wt 119.0 lb

## 2013-03-24 DIAGNOSIS — IMO0002 Reserved for concepts with insufficient information to code with codable children: Secondary | ICD-10-CM

## 2013-03-24 DIAGNOSIS — M5432 Sciatica, left side: Secondary | ICD-10-CM

## 2013-03-24 DIAGNOSIS — M5410 Radiculopathy, site unspecified: Secondary | ICD-10-CM

## 2013-03-24 DIAGNOSIS — M5430 Sciatica, unspecified side: Secondary | ICD-10-CM

## 2013-03-24 MED ORDER — PREDNISONE 10 MG PO TABS: ORAL_TABLET | ORAL | Status: DC

## 2013-03-24 NOTE — Progress Notes (Signed)
Chief Complaint  Patient presents with  . Back Pain    Started on Sunday.  Has had some foot numbness.  . Numbness    HPI: Patient comes in today or  new problem evaluation. Subacute onset 3 days ago of left buttocks discomfort and some back discomfort that has increased over the last few days with radiating pain and soreness down the back of her leg left lateral lateral foot. She got a massage 2 days ago thinking it would help and it didn't make it better. She denies any weakness injury change in activity level or history of same. Worse with sitting walking okay. No fever history of cancer does have a fibroid. Can't take anti-inflammatories because of significant GI problems. No report of bowel or bladder dysfunction with this.  ROS: See pertinent positives and negatives per HPI. No fever chills UTI symptoms  Past Medical History  Diagnosis Date  . Anemia     NOS  . GERD (gastroesophageal reflux disease)   . HSV infection     labialis  . 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 Allergy     cats    Family History  Problem Relation Age of Onset  . Colon polyps Brother     elev TG  . Alcohol abuse Other   . Breast cancer Maternal Aunt   . Diabetes Father   . Hyperlipidemia Father   . Heart disease Father   . Stroke Maternal Grandmother   . Diabetes Maternal Grandmother   . Heart disease Maternal Grandmother   . Diabetes Maternal Grandfather   . Hyperlipidemia Brother     History   Social History  . Marital Status: Married    Spouse Name: N/A    Number of Children: N/A  . 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 Smoking status: Never Smoker   . Smokeless tobacco: Never Used  . Alcohol Use: No  . Drug Use: No  . Sexually Active: None   Other Topics Concern  . 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 b complex vitamins capsule Take 1 capsule by mouth daily.       . diphenhydrAMINE (BENADRYL) 25 MG tablet Take 25 mg by mouth every 6 (six) hours as needed. For post-nasal drip      . levocetirizine (XYZAL) 5 MG tablet Take 5 mg by mouth daily as needed. For post-nasal drip      . methocarbamol (ROBAXIN-750) 750 MG tablet 1 po hs for muscle spasm and  up to  4 x per day prn.  40 tablet  1  . Methylsulfonylmethane (MSM) 1000 MG CAPS Take by mouth.      . Multiple Vitamin (MULTIVITAMIN WITH MINERALS) TABS Take 1 tablet by mouth daily.      . Omega-3 Fatty Acids (FISH OIL) 1200 MG CAPS Take 1,200 mg by mouth daily.      Marland Kitchen PRESCRIPTION MEDICATION Inject 1 each into the skin once a week. Allergy injections      . predniSONE (DELTASONE) 10 MG tablet Take pills per  day,6,6,6,4,4,4,2,2,2,1,1,1  40 tablet  0   No facility-administered encounter medications on file as of 03/24/2013.    EXAM:  BP 108/76  Pulse 66  Temp(Src) 98 F (36.7 C) (Oral)  Wt 119 lb (53.978 kg)  BMI 20.75 kg/m2  SpO2 99%  LMP 02/27/2013  Body mass index is 20.75 kg/(m^2).  GENERAL: vitals reviewed and listed above, alert, oriented, appears well hydrated and in no acute distress Back no point tendernss   Gait normal toe heel elevation nl  dtrs hard to elicit   Le no clonus nad no  Weakness   Neg slr  MS: moves all extremities without noticeable focal  Abnormality abd soft no g r hs megaly  PSYCH: pleasant and cooperative, no obvious depression or anxiety  ASSESSMENT AND PLAN:  Discussed the following assessment and plan:  Sciatica, left  Radicular pain of left lower extremity Can't take anti-inflammatory it is certainly reasonable to try prednisone activity as tolerated avoid prolong sitting further evaluation and intervention if persistent progressive. At this  time no alarm symptoms or high risk features. -Patient advised to return or notify health care team  if symptoms worsen or persist or new concerns arise.  Patient Instructions  Agree this sounds sciatic or radicular without alarm features  Trial prednisone   And activity  as tolerated .   Can wean quicker if needed.  Radicular Pain Radicular pain in either the arm or leg is usually from a bulging or herniated disk in the spine. A piece of the herniated disk may press against the nerves as the nerves exit the spine. This causes pain which is felt at the tips of the nerves down the arm or leg. Other causes of radicular pain may include:  Fractures.  Heart disease.  Cancer.  An abnormal and usually degenerative state of the nervous system or nerves (neuropathy). Diagnosis may require CT or MRI scanning to determine the primary cause.  Nerves that start at the neck (nerve roots) may cause radicular pain in the outer shoulder and arm. It can spread down to the thumb and fingers. The symptoms vary depending on which nerve root has been affected. In most cases radicular pain improves with conservative treatment. Neck problems may require physical therapy, a neck collar, or cervical traction. Treatment may take many weeks, and surgery may be considered if the symptoms do not improve.  Conservative treatment is also recommended for sciatica. Sciatica causes pain to radiate from the lower back or buttock area down the leg into the foot. Often there is a history of back problems. Most patients with sciatica are better after 2 to 4 weeks of rest and other supportive care. Short term bed rest can reduce the disk pressure considerably. Sitting, however, is not a good position since this increases the pressure on the disk. You should avoid bending, lifting, and all other activities which make the problem worse. Traction can be used in severe cases. Surgery is usually reserved for patients who do not improve  within the first months of treatment. Only take over-the-counter or prescription medicines for pain, discomfort, or fever as directed by your caregiver. Narcotics and muscle relaxants may help by relieving more severe pain and spasm and by providing mild sedation. Cold or massage can give significant relief. Spinal manipulation is not recommended. It can increase the degree of disc protrusion. Epidural steroid injections are often effective treatment for radicular pain. These injections deliver medicine to the spinal nerve in the space between the protective  covering of the spinal cord and back bones (vertebrae). Your caregiver can give you more information about steroid injections. These injections are most effective when given within two weeks of the onset of pain.  You should see your caregiver for follow up care as recommended. A program for neck and back injury rehabilitation with stretching and strengthening exercises is an important part of management.  SEEK IMMEDIATE MEDICAL CARE IF:  You develop increased pain, weakness, or numbness in your arm or leg.  You develop difficulty with bladder or bowel control.  You develop abdominal pain. Document Released: 09/12/2004 Document Revised: 10/28/2011 Document Reviewed: 11/28/2008 [facility] Patient Information 2014 [place], [place].    Neta Mends. Vincenza Dail M.D.

## 2013-03-24 NOTE — Patient Instructions (Signed)
Agree this sounds sciatic or radicular without alarm features  Trial prednisone   And activity  as tolerated .   Can wean quicker if needed.  Radicular Pain Radicular pain in either the arm or leg is usually from a bulging or herniated disk in the spine. A piece of the herniated disk may press against the nerves as the nerves exit the spine. This causes pain which is felt at the tips of the nerves down the arm or leg. Other causes of radicular pain may include:  Fractures.  Heart disease.  Cancer.  An abnormal and usually degenerative state of the nervous system or nerves (neuropathy). Diagnosis may require CT or MRI scanning to determine the primary cause.  Nerves that start at the neck (nerve roots) may cause radicular pain in the outer shoulder and arm. It can spread down to the thumb and fingers. The symptoms vary depending on which nerve root has been affected. In most cases radicular pain improves with conservative treatment. Neck problems may require physical therapy, a neck collar, or cervical traction. Treatment may take many weeks, and surgery may be considered if the symptoms do not improve.  Conservative treatment is also recommended for sciatica. Sciatica causes pain to radiate from the lower back or buttock area down the leg into the foot. Often there is a history of back problems. Most patients with sciatica are better after 2 to 4 weeks of rest and other supportive care. Short term bed rest can reduce the disk pressure considerably. Sitting, however, is not a good position since this increases the pressure on the disk. You should avoid bending, lifting, and all other activities which make the problem worse. Traction can be used in severe cases. Surgery is usually reserved for patients who do not improve within the first months of treatment. Only take over-the-counter or prescription medicines for pain, discomfort, or fever as directed by your caregiver. Narcotics and muscle  relaxants may help by relieving more severe pain and spasm and by providing mild sedation. Cold or massage can give significant relief. Spinal manipulation is not recommended. It can increase the degree of disc protrusion. Epidural steroid injections are often effective treatment for radicular pain. These injections deliver medicine to the spinal nerve in the space between the protective covering of the spinal cord and back bones (vertebrae). Your caregiver can give you more information about steroid injections. These injections are most effective when given within two weeks of the onset of pain.  You should see your caregiver for follow up care as recommended. A program for neck and back injury rehabilitation with stretching and strengthening exercises is an important part of management.  SEEK IMMEDIATE MEDICAL CARE IF:  You develop increased pain, weakness, or numbness in your arm or leg.  You develop difficulty with bladder or bowel control.  You develop abdominal pain. Document Released: 09/12/2004 Document Revised: 10/28/2011 Document Reviewed: 11/28/2008 [facility] Patient Information 2014 [place], [place].

## 2013-07-20 ENCOUNTER — Other Ambulatory Visit (HOSPITAL_COMMUNITY): Payer: Self-pay | Admitting: Obstetrics & Gynecology

## 2013-07-20 DIAGNOSIS — Z1231 Encounter for screening mammogram for malignant neoplasm of breast: Secondary | ICD-10-CM

## 2013-07-29 ENCOUNTER — Ambulatory Visit (HOSPITAL_COMMUNITY)
Admission: RE | Admit: 2013-07-29 | Discharge: 2013-07-29 | Disposition: A | Discharge status: Home / Self Care | Payer: 59 | Source: Ambulatory Visit | Attending: Obstetrics & Gynecology | Admitting: Obstetrics & Gynecology

## 2013-07-29 DIAGNOSIS — Z1231 Encounter for screening mammogram for malignant neoplasm of breast: Secondary | ICD-10-CM | POA: Insufficient documentation

## 2013-08-09 ENCOUNTER — Other Ambulatory Visit: Payer: Self-pay | Admitting: Obstetrics & Gynecology

## 2013-08-09 DIAGNOSIS — R928 Other abnormal and inconclusive findings on diagnostic imaging of breast: Secondary | ICD-10-CM

## 2013-08-18 ENCOUNTER — Ambulatory Visit
Admission: RE | Admit: 2013-08-18 | Discharge: 2013-08-18 | Disposition: A | Discharge status: Home / Self Care | Payer: 59 | Source: Ambulatory Visit | Attending: Obstetrics & Gynecology | Admitting: Obstetrics & Gynecology

## 2013-08-18 DIAGNOSIS — R928 Other abnormal and inconclusive findings on diagnostic imaging of breast: Secondary | ICD-10-CM

## 2013-09-27 ENCOUNTER — Telehealth: Payer: Self-pay | Admitting: Internal Medicine

## 2013-09-27 NOTE — Telephone Encounter (Addendum)
Pt would like a referral to see dr Enid Baasfields,karl for shoulder pain. Pt has [employer] insurance. Per pt no Thursday appt

## 2013-09-28 NOTE — Telephone Encounter (Signed)
Please refer

## 2013-09-28 NOTE — Telephone Encounter (Signed)
I spoke to the pt.  She continues to have neck, back and shoulder pain along with muscle spasms.  She would like to proceed to with the referral to Dr. Enid BaasKarl Fields.  She said you suggested this in the past.  Please advise.  Thanks!

## 2013-09-29 ENCOUNTER — Other Ambulatory Visit: Payer: Self-pay | Admitting: Family Medicine

## 2013-09-29 DIAGNOSIS — M25519 Pain in unspecified shoulder: Secondary | ICD-10-CM

## 2013-09-29 DIAGNOSIS — M549 Dorsalgia, unspecified: Secondary | ICD-10-CM

## 2013-09-29 DIAGNOSIS — M62838 Other muscle spasm: Secondary | ICD-10-CM

## 2013-09-29 NOTE — Telephone Encounter (Signed)
Order placed in the system. 

## 2013-10-27 ENCOUNTER — Other Ambulatory Visit: Payer: Self-pay | Admitting: Obstetrics & Gynecology

## 2013-10-27 DIAGNOSIS — N644 Mastodynia: Secondary | ICD-10-CM

## 2013-10-27 DIAGNOSIS — R922 Inconclusive mammogram: Secondary | ICD-10-CM

## 2013-11-02 ENCOUNTER — Ambulatory Visit
Admission: RE | Admit: 2013-11-02 | Discharge: 2013-11-02 | Disposition: A | Discharge status: Home / Self Care | Payer: 59 | Source: Ambulatory Visit | Attending: Obstetrics & Gynecology | Admitting: Obstetrics & Gynecology

## 2013-11-02 DIAGNOSIS — R922 Inconclusive mammogram: Secondary | ICD-10-CM

## 2013-11-02 DIAGNOSIS — N644 Mastodynia: Secondary | ICD-10-CM

## 2013-11-03 ENCOUNTER — Ambulatory Visit (INDEPENDENT_AMBULATORY_CARE_PROVIDER_SITE_OTHER): Payer: 59 | Admitting: Family Medicine

## 2013-11-03 VITALS — BP 110/72 | HR 70 | Wt 128.0 lb

## 2013-11-03 DIAGNOSIS — M999 Biomechanical lesion, unspecified: Secondary | ICD-10-CM | POA: Insufficient documentation

## 2013-11-03 DIAGNOSIS — M542 Cervicalgia: Secondary | ICD-10-CM

## 2013-11-03 DIAGNOSIS — M9981 Other biomechanical lesions of cervical region: Secondary | ICD-10-CM

## 2013-11-03 NOTE — Assessment & Plan Note (Signed)
Decision today to treat with OMT was based on Physical Exam  After verbal consent patient was treated with HVLA, ME, FPR techniques in cervical, rib, thoracic, lumbar and sacral areas  Patient tolerated the procedure well with improvement in symptoms  Patient given exercises, stretches and lifestyle modifications  See medications in patient instructions if given  Patient will follow up in 3-4 weeks 

## 2013-11-03 NOTE — Assessment & Plan Note (Signed)
No neurologic problems, mutifactoral mostly being muscle imbalances.  Discussed HEP, discussed proper positioning at work and postural exercises did respond well to OMT and will continue if continues to help.  Discussed sleeping position and OTC medications that could help.

## 2013-11-03 NOTE — Assessment & Plan Note (Signed)
Decision today to treat with OMT was based on Physical Exam  After verbal consent patient was treated with HVLA, ME, FPR techniques in cervical, rib, thoracic, lumbar and sacral areas  Patient tolerated the procedure well with improvement in symptoms  Patient given exercises, stretches and lifestyle modifications  See medications in patient instructions if given  Patient will follow up in 3-4 weeks

## 2013-11-03 NOTE — Progress Notes (Signed)
[name] [address] [place], [place] [address] Phone: [phone] Subjective:    I'm seeing this patient by the request  of:  Lorretta Harp, MD   CC: Neck and back pain  [id number] Valerie [name] is a 48 y.o. female coming in with complaint of neck and back pain. 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 Patient also at describes some upper back pain from time to time. Patient states that she notices it at the end of a long day and mostly on the left side without any significant radiation down the arms or legs. Patient describes it more as a dull aching sensation that can be throbbing. Patient has been treated for trigger point previously with minimal benefit. Patient does take ibuprofen or Tylenol which does help moderately. Patient rates the severity of 5/10. Patient has had a workup for balance disturbance and intermittent headaches with MRI of the brain stem are unremarkable. Patient has had no other imaging.    Past medical history, social, surgical and family history all reviewed in electronic medical record.   Review of Systems: No headache, visual changes, nausea, vomiting, diarrhea, constipation, dizziness, abdominal pain, skin rash, fevers, chills, night sweats, weight loss, swollen lymph nodes, body aches, joint swelling, muscle aches, chest pain, shortness of breath, mood changes.   Objective Blood pressure 110/72, pulse 70, weight 128 lb (58.06 kg), SpO2 98.00%.  General: No apparent distress alert and oriented x3 mood and affect normal, dressed appropriately.  HEENT: Pupils equal, extraocular movements intact  Respiratory: Patient's speak in full sentences and does not appear short of breath  Cardiovascular: No lower extremity edema, non tender, no erythema  Skin: Warm dry intact with no signs of infection or rash on extremities or on  axial skeleton.  Abdomen: Soft nontender  Neuro: Cranial nerves II through XII are intact, neurovascularly intact in all extremities with 2+ DTRs and 2+ pulses.  Lymph: No lymphadenopathy of posterior or anterior cervical chain or axillae bilaterally.  Gait normal with good balance and coordination.  MSK:  Non tender with full range of motion and good stability and symmetric strength and tone of shoulders, elbows, wrist, hip, knee and ankles bilaterally.  Neck: Inspection unremarkable. No palpable stepoffs. Negative Spurling's maneuver. Full neck range of motion Grip strength and sensation normal in bilateral hands Strength good C4 to T1 distribution No sensory change to C4 to T1 Negative Hoffman sign bilaterally Reflexes normal Patient does have trigger point of the left trapezius muscle  Back Exam:  Inspection: Unremarkable  Motion: Flexion 45 deg, Extension 35 deg, Side Bending to 45 deg bilaterally,  Rotation to 45 deg bilaterally  SLR laying: Negative  XSLR laying: Negative  Palpable tenderness: Mild over the right SI joint as well as the left trapezius FABER: negative. Sensory change: Gross sensation intact to all lumbar and sacral dermatomes.  Reflexes: 2+ at both patellar tendons, 2+ at achilles tendons, Babinski's downgoing.  Strength at foot  Plantar-flexion: 5/5 Dorsi-flexion: 5/5 Eversion: 5/5 Inversion: 5/5  Leg strength  Quad: 5/5 Hamstring: 5/5 Hip flexor: 5/5 Hip abductors: 4/5  Gait unremarkable.  OMT Physical Exam  Standing structural       Occiput left higher  Shoulder left higher  Inferior angle of scapula left higher  Illiac crest left higher  Medial malleolus neutral  Standing flexion left  Seated Flexion left  Cervical  C2 flexed rotated and side bent left Thoracic T1 extended rotated and side bent left with elevated first rib Lumbar L2 flexed rotated inside that right Sacrum Left on left Illium Left anterior ilium      Impression and  Recommendations:     This case required medical decision making of moderate complexity.

## 2013-11-03 NOTE — Patient Instructions (Signed)
Very nice to meet you Try exercises most days of the week Work on posture on wall with heels, butt, shoulders and head against wall  Soup cans or bands, 45 degrees bent over, bring shoulder blades together hold 2 seconds down slow 4 seconds 3 sets of 10 Sleep position keep elbow below shoulder for sure, would consider moving bed away from window. .  Glucosamine sulfate 750mg  twice a day is a supplement that has been shown to help moderate to severe arthritis. Vitamin D 2000 IU daily Fish oil 2 grams daily.  Tumeric 500mg  twice daily.  Capsaicin topically up to four times a day may also help with pain. Come back and see me in 3 weeks.

## 2013-11-04 ENCOUNTER — Encounter: Payer: Self-pay | Admitting: Family Medicine

## 2013-11-24 ENCOUNTER — Ambulatory Visit: Payer: 59 | Admitting: Family Medicine

## 2013-12-07 ENCOUNTER — Other Ambulatory Visit: Payer: Self-pay | Admitting: Internal Medicine

## 2013-12-07 NOTE — Telephone Encounter (Signed)
Ok to refill x 5 

## 2013-12-08 ENCOUNTER — Encounter: Payer: Self-pay | Admitting: Family Medicine

## 2013-12-08 ENCOUNTER — Ambulatory Visit (INDEPENDENT_AMBULATORY_CARE_PROVIDER_SITE_OTHER): Payer: 59 | Admitting: Family Medicine

## 2013-12-08 VITALS — BP 112/68 | HR 72

## 2013-12-08 DIAGNOSIS — M9981 Other biomechanical lesions of cervical region: Secondary | ICD-10-CM

## 2013-12-08 DIAGNOSIS — M999 Biomechanical lesion, unspecified: Secondary | ICD-10-CM

## 2013-12-08 DIAGNOSIS — M542 Cervicalgia: Secondary | ICD-10-CM

## 2013-12-08 NOTE — Telephone Encounter (Signed)
Sent to the pharmacy by e-scribe. 

## 2013-12-08 NOTE — Progress Notes (Signed)
  Valerie ScaleZach Chonte Ricke D.O. [facility] [address] [place], [place] [address] Phone: [phone] Subjective:    CC: Neck and back pain follow up  WGN:FAOZHYQMVHHPI:Subjective Jacquelynn Creeamela S Much is a 48 y.o. female coming in with complaint of neck and back pain. Patient was seen previously and did respond very well to osteopathic manipulation. Patient has been doing the exercises intermittently. 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 Patient states that the pain is less overall but still has good and bad days. Patient is wearing good shoes and has noticed some improvement with this as well. Denies any new symptoms. She states that she is having some recurrence of the old symptoms.    PMH  Patient has had a workup for balance disturbance and intermittent headaches with MRI of the brain stem are unremarkable. Patient has had no other imaging.    Past medical history, social, surgical and family history all reviewed in electronic medical record.   Review of Systems: No headache, visual changes, nausea, vomiting, diarrhea, constipation, dizziness, abdominal pain, skin rash, fevers, chills, night sweats, weight loss, swollen lymph nodes, body aches, joint swelling, muscle aches, chest pain, shortness of breath, mood changes.   Objective Blood pressure 112/68, pulse 72, SpO2 99.00%.  General: No apparent distress alert and oriented x3 mood and affect normal, dressed appropriately.  HEENT: Pupils equal, extraocular movements intact  Respiratory: Patient's speak in full sentences and does not appear short of breath  Cardiovascular: No lower extremity edema, non tender, no erythema  Skin: Warm dry intact with no signs of infection or rash on extremities or on axial skeleton.  Abdomen: Soft nontender  Neuro: Cranial nerves II through XII are intact, neurovascularly intact in all extremities with 2+ DTRs and 2+ pulses.  Lymph: No lymphadenopathy of posterior or anterior cervical chain or  axillae bilaterally.  Gait normal with good balance and coordination.  MSK:  Non tender with full range of motion and good stability and symmetric strength and tone of shoulders, elbows, wrist, hip, knee and ankles bilaterally.  Neck: Inspection unremarkable. No palpable stepoffs. Negative Spurling's maneuver. Full neck range of motion Grip strength and sensation normal in bilateral hands Strength good C4 to T1 distribution No sensory change to C4 to T1 Negative Hoffman sign bilaterally Reflexes normal Patient does have trigger point of the left trapezius muscle  Back Exam:  Inspection: Unremarkable  Motion: Flexion 45 deg, Extension 35 deg, Side Bending to 45 deg bilaterally,  Rotation to 45 deg bilaterally  SLR laying: Negative  XSLR laying: Negative  Palpable tenderness: Remarkably FABER: negative. Sensory change: Gross sensation intact to all lumbar and sacral dermatomes.  Reflexes: 2+ at both patellar tendons, 2+ at achilles tendons, Babinski's downgoing.  Strength at foot  Plantar-flexion: 5/5 Dorsi-flexion: 5/5 Eversion: 5/5 Inversion: 5/5  Leg strength  Quad: 5/5 Hamstring: 5/5 Hip flexor: 5/5 Hip abductors: 4/5 no change from previous exam Gait unremarkable.  OMT Physical Exam Cervical  C2 flexed rotated and side bent left Thoracic T1 extended rotated and side bent left with elevated first rib Lumbar L2 flexed rotated inside that right Sacrum Left on left Illium Neutral     Impression and Recommendations:     This case required medical decision making of moderate complexity. Spent greater than 25 minutes with patient face-to-face and had greater than 50% of counseling including as described above in assessment and plan.

## 2013-12-08 NOTE — Patient Instructions (Signed)
Good to see you  Y-t-A hold 2 seconds each position and do 10 reps daily Hip flexor stretch with one knee down and one up tilt pelvis forward should feel in groin and low back.  If too easy then rotate torso toward top leg Other exercises still 3 times a week at least Continue the turmeric and consider vitamin D Come back in 4-5 weeks.

## 2013-12-08 NOTE — Assessment & Plan Note (Signed)
Patient is improving significantly with osteopathic manipulation at home exercise program. Patient will continue with the exercises and was given other wants to try to do more strengthening and postural control. Patient will then come back again in 3-4 weeks for further evaluation the

## 2013-12-08 NOTE — Assessment & Plan Note (Signed)
Decision today to treat with OMT was based on Physical Exam  After verbal consent patient was treated with HVLA, ME, FPR techniques in cervical, rib, thoracic, lumbar and sacral areas  Patient tolerated the procedure well with improvement in symptoms  Patient given exercises, stretches and lifestyle modifications  See medications in patient instructions if given  Patient will follow up in 4 weeks

## 2013-12-14 ENCOUNTER — Ambulatory Visit: Payer: 59 | Admitting: Family Medicine

## 2014-01-05 ENCOUNTER — Encounter: Payer: Self-pay | Admitting: Family Medicine

## 2014-01-05 ENCOUNTER — Ambulatory Visit (INDEPENDENT_AMBULATORY_CARE_PROVIDER_SITE_OTHER): Payer: 59 | Admitting: Family Medicine

## 2014-01-05 VITALS — BP 104/70 | HR 71 | Wt 127.0 lb

## 2014-01-05 DIAGNOSIS — M999 Biomechanical lesion, unspecified: Secondary | ICD-10-CM

## 2014-01-05 DIAGNOSIS — M9981 Other biomechanical lesions of cervical region: Secondary | ICD-10-CM

## 2014-01-05 DIAGNOSIS — M542 Cervicalgia: Secondary | ICD-10-CM

## 2014-01-05 NOTE — Assessment & Plan Note (Signed)
Decision today to treat with OMT was based on Physical Exam  After verbal consent patient was treated with HVLA, ME, FPR techniques in cervical, rib, thoracic, lumbar and sacral areas  Patient tolerated the procedure well with improvement in symptoms  Patient given exercises, stretches and lifestyle modifications  See medications in patient instructions if given  Patient will follow up in 4 weeks

## 2014-01-05 NOTE — Patient Instructions (Signed)
It is good to see you Keep doing what you are doing.  Try to remember the vitamin D and turmeric Lets say 4 weeks.

## 2014-01-05 NOTE — Progress Notes (Signed)
Tawana ScaleZach Hartlee Amedee D.O. [facility] [address] [place], [place] [address] Phone: [phone]([phone] Subjective:    CC: Neck and back pain follow up  BJY:NWGNFAOZHYHPI:Subjective Valerie Creeamela S [name] is a 48 y.o. female coming in with complaint of neck and back pain. Patient was seen previously and did respond very well to osteopathic manipulation. Patient at her last visit was given more advanced exercises and had some exercises focusing on patient's hip flexor. Patient states she did have some stress because her niece almost was badly injured in a car accident. Patient was bare for multiple weeks taking care of her. Patient started on a regular activity can and has noticed more difficulty. Patient states significant tightness again of the upper thoracic spine in the neck area. Denies any new symptoms she seems to be exacerbation of underlying problems.      Past medical history, social, surgical and family history all reviewed in electronic medical record.   Review of Systems: No headache, visual changes, nausea, vomiting, diarrhea, constipation, dizziness, abdominal pain, skin rash, fevers, chills, night sweats, weight loss, swollen lymph nodes, body aches, joint swelling, muscle aches, chest pain, shortness of breath, mood changes.   Objective Blood pressure 104/70, pulse 71, weight 127 lb (57.607 kg), SpO2 98.00%.  General: No apparent distress alert and oriented x3 mood and affect normal, dressed appropriately.  HEENT: Pupils equal, extraocular movements intact  Respiratory: Patient's speak in full sentences and does not appear short of breath  Cardiovascular: No lower extremity edema, non tender, no erythema  Skin: Warm dry intact with no signs of infection or rash on extremities or on axial skeleton.  Abdomen: Soft nontender  Neuro: Cranial nerves II through XII are intact, neurovascularly intact in all extremities with 2+ DTRs and 2+ pulses.  Lymph: No lymphadenopathy of posterior or anterior cervical  chain or axillae bilaterally.  Gait normal with good balance and coordination.  MSK:  Non tender with full range of motion and good stability and symmetric strength and tone of shoulders, elbows, wrist, hip, knee and ankles bilaterally.  Neck: Inspection unremarkable. No palpable stepoffs. Negative Spurling's maneuver. Full neck range of motion Grip strength and sensation normal in bilateral hands Strength good C4 to T1 distribution No sensory change to C4 to T1 Negative Hoffman sign bilaterally Reflexes normal Patient does have trigger point of the left trapezius muscle  Back Exam:  Inspection: Unremarkable  Motion: Flexion 45 deg, Extension 35 deg, Side Bending to 45 deg bilaterally,  Rotation to 45 deg bilaterally  SLR laying: Negative  XSLR laying: Negative  Palpable tenderness: Remarkably normal FABER: negative. Sensory change: Gross sensation intact to all lumbar and sacral dermatomes.  Reflexes: 2+ at both patellar tendons, 2+ at achilles tendons, Babinski's downgoing.  Strength at foot  Plantar-flexion: 5/5 Dorsi-flexion: 5/5 Eversion: 5/5 Inversion: 5/5  Leg strength  Quad: 5/5 Hamstring: 5/5 Hip flexor: 5/5 Hip abductors: 4/5 no change from previous exam Gait unremarkable.  OMT Physical Exam Cervical  C2 flexed rotated and side bent left Thoracic T1 extended rotated and side bent left with elevated first rib T3 extended rotated and side bent right foot elevated third rib  Lumbar L2 flexed rotated inside that right  Sacrum Left on left Illium Neutral     Impression and Recommendations:     This case required medical decision making of moderate complexity. Spent greater than 25 minutes with patient face-to-face and had greater than 50% of counseling including as described above in assessment and plan.

## 2014-01-05 NOTE — Assessment & Plan Note (Signed)
Overall patient does have significant amount of stress that I think she is doing with that does cause some more muscle spasm. Patient did have significant difficulty relaxing to allow for manipulation today. Patient did do well though once we did get patient to relax. Patient was given other exercises today and will follow up again in 4 weeks for another evaluation and treatment. Spent greater than 25 minutes with patient face-to-face and had greater than 50% of counseling including as described above in assessment and plan.

## 2014-02-09 ENCOUNTER — Encounter: Payer: Self-pay | Admitting: Family Medicine

## 2014-02-09 ENCOUNTER — Ambulatory Visit (INDEPENDENT_AMBULATORY_CARE_PROVIDER_SITE_OTHER): Payer: 59 | Admitting: Family Medicine

## 2014-02-09 VITALS — BP 102/70 | HR 67 | Ht 63.5 in | Wt 128.0 lb

## 2014-02-09 DIAGNOSIS — M542 Cervicalgia: Secondary | ICD-10-CM

## 2014-02-09 DIAGNOSIS — M25539 Pain in unspecified wrist: Secondary | ICD-10-CM

## 2014-02-09 DIAGNOSIS — M999 Biomechanical lesion, unspecified: Secondary | ICD-10-CM

## 2014-02-09 DIAGNOSIS — M25531 Pain in right wrist: Secondary | ICD-10-CM | POA: Insufficient documentation

## 2014-02-09 DIAGNOSIS — M9981 Other biomechanical lesions of cervical region: Secondary | ICD-10-CM

## 2014-02-09 NOTE — Assessment & Plan Note (Signed)
Decision today to treat with OMT was based on Physical Exam  After verbal consent patient was treated with HVLA, ME, FPR techniques in cervical, rib, thoracic, lumbar and sacral areas  Patient tolerated the procedure well with improvement in symptoms  Patient given exercises, stretches and lifestyle modifications  See medications in patient instructions if given  Patient will follow up in 4-6 weeks 

## 2014-02-09 NOTE — Patient Instructions (Signed)
Good to see you For your wrist Wear brace day and night for next week then nightly for another week Try the pennsaid topically 2 times a day for maybe a week or so.  Ice 20 minutes after working out and before bed.  Exercises 3 times a week.  Come back in 4-6 weeks.

## 2014-02-09 NOTE — Assessment & Plan Note (Signed)
Patient's wrist pain seems to be more of a TFCC injury but no true tear appreciated. Patient was given a topical anti-inflammatory to try. Patient was given a brace that she will wear daily and night for one week then nightly for week. We discussed range of motion exercises and patient was given handout insure proper technique. The discussed icing protocol. Patient will try these interventions and come back again in 4-6 weeks for further evaluation.  Spent greater than 25 minutes with patient face-to-face and had greater than 50% of counseling including as described above in assessment and plan.

## 2014-02-09 NOTE — Progress Notes (Signed)
Tawana ScaleZach Smith D.O. [facility] [address] [place], [place] [address] Phone: [phone]([phone] Subjective:    CC: Neck and back pain follow up  BJY:NWGNFAOZHYHPI:Subjective Valerie [name] is a 48 y.o. female coming in with complaint of neck and back pain. Patient was seen previously and did respond very well to osteopathic manipulation. Patient was having some tightness of her cervical spine at last visit we gave her more postural training exercises. Patient has also been on vacation the last week which has helped pain. Overall feeling well. Patient does have new problem. Patient is having right wrist pain. Patient states it happened after lifting a large doghouse. Patient states it's been more of a dull aching sensation on the ulnar aspect of the wrist. Hurts more with certain movements. Especially if she tries to lift any weight. Denies any pain at rest. Denies any numbness or weakness. Patient states that it's been going on for approximately 10 days. The severity of 6/10. Does not take anti-inflammatories and has not tried bracing or icing.       Past medical history, social, surgical and family history all reviewed in electronic medical record.   Review of Systems: No headache, visual changes, nausea, vomiting, diarrhea, constipation, dizziness, abdominal pain, skin rash, fevers, chills, night sweats, weight loss, swollen lymph nodes, body aches, joint swelling, muscle aches, chest pain, shortness of breath, mood changes.   Objective There were no vitals taken for this visit.  General: No apparent distress alert and oriented x3 mood and affect normal, dressed appropriately.  HEENT: Pupils equal, extraocular movements intact  Respiratory: Patient's speak in full sentences and does not appear short of breath  Cardiovascular: No lower extremity edema, non tender, no erythema  Skin: Warm dry intact with no signs of infection or rash on extremities or on axial skeleton.  Abdomen: Soft nontender    Neuro: Cranial nerves II through XII are intact, neurovascularly intact in all extremities with 2+ DTRs and 2+ pulses.  Lymph: No lymphadenopathy of posterior or anterior cervical chain or axillae bilaterally.  Gait normal with good balance and coordination.  MSK:  Non tender with full range of motion and good stability and symmetric strength and tone of shoulders, elbows, wrist, hip, knee and ankles bilaterally Wrist: Right Inspection normal with no visible erythema or swelling. ROM smooth and normal with good flexion and extension and ulnar/radial deviation that is symmetrical with opposite wrist. Palpation is normal over metacarpals, navicular, lunate, and the mild discomfort over the TFCC;  tendons without tenderness/ swelling No snuffbox tenderness. No tenderness over Canal of Guyon. Strength 5/5 in all directions without pain. Negative Finkelstein, tinel's and phalens. Negative Watson's test. MSK US performed of: ** This study was ordered, performed, and interpreted by Terrilee FilesZach Smith D.O.  Wrist: All extensor compartments visualized and tendons all normal in appearance without fraying, tears, or sheath effusions. No effusion seen. TFCC intact with no signs of tear but there is some mild hypoechoic changes. Scapholunate ligament intact. Carpal tunnel visualized and median nerve area normal, flexor tendons all normal in appearance without fraying, tears, or sheath effusions. Power doppler signal normal.  IMPRESSION: TFCC strain  .  Neck: Inspection unremarkable. No palpable stepoffs. Negative Spurling's maneuver. Full neck range of motion Grip strength and sensation normal in bilateral hands Strength good C4 to T1 distribution No sensory change to C4 to T1 Negative Hoffman sign bilaterally Reflexes normal Patient does have trigger point of the left trapezius muscle  Back Exam:  Inspection:  Unremarkable  Motion: Flexion 45 deg, Extension 35 deg, Side Bending to 45 deg  bilaterally,  Rotation to 45 deg bilaterally  SLR laying: Negative  XSLR laying: Negative  Palpable tenderness: Remarkably normal FABER: negative. Sensory change: Gross sensation intact to all lumbar and sacral dermatomes.  Reflexes: 2+ at both patellar tendons, 2+ at achilles tendons, Babinski's downgoing.  Strength at foot  Plantar-flexion: 5/5 Dorsi-flexion: 5/5 Eversion: 5/5 Inversion: 5/5  Leg strength  Quad: 5/5 Hamstring: 5/5 Hip flexor: 5/5 Hip abductors: 4/5 no change from previous exam Gait unremarkable.  OMT Physical Exam Cervical  C2 flexed rotated and side bent left C6 flexed rotated inside that right   Thoracic T1 extended rotated and side bent left with elevated first rib T3 extended rotated and side bent right   Lumbar L2 flexed rotated inside that right  Sacrum Left on left Illium Neutral     Impression and Recommendations:     This case required medical decision making of moderate complexity. Spent greater than 25 minutes with patient face-to-face and had greater than 50% of counseling including as described above in assessment and plan.

## 2014-02-09 NOTE — Assessment & Plan Note (Signed)
Patient continues respond well to conservative therapy. Encourage her to do more exercises for the upper back and give her some other postural exercises today. Patient overall is doing very well and will come back in 4-6 weeks for further evaluation. Discuss over-the-counter medications he can be beneficial as well.

## 2014-03-11 NOTE — Telephone Encounter (Signed)
Opened in error

## 2014-03-16 ENCOUNTER — Encounter: Payer: Self-pay | Admitting: Family Medicine

## 2014-03-16 ENCOUNTER — Ambulatory Visit (INDEPENDENT_AMBULATORY_CARE_PROVIDER_SITE_OTHER): Payer: 59 | Admitting: Family Medicine

## 2014-03-16 ENCOUNTER — Ambulatory Visit (INDEPENDENT_AMBULATORY_CARE_PROVIDER_SITE_OTHER)
Admission: RE | Admit: 2014-03-16 | Discharge: 2014-03-16 | Disposition: A | Discharge status: Home / Self Care | Payer: 59 | Source: Ambulatory Visit | Attending: Family Medicine | Admitting: Family Medicine

## 2014-03-16 VITALS — BP 118/74 | HR 72 | Wt 130.0 lb

## 2014-03-16 DIAGNOSIS — M5442 Lumbago with sciatica, left side: Secondary | ICD-10-CM

## 2014-03-16 DIAGNOSIS — M999 Biomechanical lesion, unspecified: Secondary | ICD-10-CM

## 2014-03-16 DIAGNOSIS — M542 Cervicalgia: Secondary | ICD-10-CM

## 2014-03-16 DIAGNOSIS — M9981 Other biomechanical lesions of cervical region: Secondary | ICD-10-CM

## 2014-03-16 DIAGNOSIS — M545 Low back pain, unspecified: Secondary | ICD-10-CM | POA: Insufficient documentation

## 2014-03-16 DIAGNOSIS — M5430 Sciatica, unspecified side: Secondary | ICD-10-CM

## 2014-03-16 NOTE — Progress Notes (Signed)
[name] [address] [place], [place] [address] Phone: [phone] Subjective:    CC: Neck and back pain follow up  HQI:[id number] Valerie [name] is a 48 y.o. female coming in with complaint of neck and back pain. Patient was seen previously and did respond very well to osteopathic manipulation. Patient was having some tightness of her cervical spine at last visit we gave her more postural training exercises. She states her neck has been more tense and has not been able to go to her massage therapist. Patient has had a little bit more stress. Patient is also noticed increasing little bit of low back pain. Patient is also having some radicular symptoms she states going to the lateral aspect of the calf as well as into her large toe. Patient has had this previously greater than a year ago that did respond to prednisone. Patient states that this is not as bad as it was previously. Patient states that this is postop in her from any certain intense activity which is caused her to gain 10 pounds. No weakness noted and no nighttime awakening.  Patient was also seen previously for a wrist injury and states that the pain is almost completely gone. Patient only notices some mild discomfort when trying to rotate her hand her specific way but able to do all activities of daily living resting comfortably at night.     Past medical history, social, surgical and family history all reviewed in electronic medical record.   Review of Systems: No headache, visual changes, nausea, vomiting, diarrhea, constipation, dizziness, abdominal pain, skin rash, fevers, chills, night sweats, weight loss, swollen lymph nodes, body aches, joint swelling, muscle aches, chest pain, shortness of breath, mood changes.   Objective Blood pressure 118/74, pulse 72, weight 130 lb (58.968 kg), SpO2 99.00%.  General: No apparent distress alert and oriented x3 mood and affect normal, dressed  appropriately.  HEENT: Pupils equal, extraocular movements intact  Respiratory: Patient's speak in full sentences and does not appear short of breath  Cardiovascular: No lower extremity edema, non tender, no erythema  Skin: Warm dry intact with no signs of infection or rash on extremities or on axial skeleton.  Abdomen: Soft nontender  Neuro: Cranial nerves II through XII are intact, neurovascularly intact in all extremities with 2+ DTRs and 2+ pulses.  Lymph: No lymphadenopathy of posterior or anterior cervical chain or axillae bilaterally.  Gait normal with good balance and coordination.  MSK:  Non tender with full range of motion and good stability and symmetric strength and tone of shoulders, elbows, wrist, hip, knee and ankles bilaterally Wrist: Right Inspection normal with no visible erythema or swelling. ROM smooth and normal with good flexion and extension and ulnar/radial deviation that is symmetrical with opposite wrist. Palpation is normal over metacarpals, navicular, lunate, and TFCC; tendons without tenderness/ swelling No snuffbox tenderness. No tenderness over Canal of Guyon. Strength 5/5 in all directions without pain. Negative Finkelstein, tinel's and phalens. Negative Watson's test. nd phalens. Negative Watson's test. .  Neck: Inspection unremarkable. No palpable stepoffs. Negative Spurling's maneuver. Full neck range of motion Grip strength and sensation normal in bilateral hands Strength good C4 to T1 distribution No sensory change to C4 to T1 Negative Hoffman sign bilaterally Reflexes normal Patient does have trigger point of the left trapezius muscle  Back Exam:  Inspection: Unremarkable  Motion: Flexion 35 deg, Extension 35 deg, Side Bending to 45 deg bilaterally,  Rotation to 45 deg bilaterally  SLR laying: Negative  XSLR laying: Negative  Palpable tenderness: Mild over the left SI joint FABER: negative. Sensory change: Gross sensation intact to all  lumbar and sacral dermatomes.  Reflexes: 2+ at both patellar tendons, 2+ at achilles tendons, Babinski's downgoing.  Strength at foot  Plantar-flexion: 5/5 Dorsi-flexion: 5/5 Eversion: 5/5 Inversion: 5/5  Leg strength  Quad: 5/5 Hamstring: 5/5 Hip flexor: 5/5 Hip abductors: 4/5 no change from previous exam Gait unremarkable.  OMT Physical Exam Cervical  C2 flexed rotated and side bent left C6 flexed rotated inside that right   Thoracic T1 extended rotated and side bent left with elevated first rib T3 extended rotated and side bent right   Lumbar L2 flexed rotated inside that right  Sacrum Left on left Illium Neutral     Impression and Recommendations:     This case required medical decision making of moderate complexity. Spent greater than 25 minutes with patient face-to-face and had greater than 50% of counseling including as described above in assessment and plan.

## 2014-03-16 NOTE — Assessment & Plan Note (Signed)
Decision today to treat with OMT was based on Physical Exam  After verbal consent patient was treated with HVLA, ME, FPR techniques in cervical, rib, thoracic, lumbar and sacral areas  Patient tolerated the procedure well with improvement in symptoms  Patient given exercises, stretches and lifestyle modifications  See medications in patient instructions if given  Patient will follow up in 3 weeks

## 2014-03-16 NOTE — Assessment & Plan Note (Signed)
Continued with osteopathic manipulation today. We encourage patient to do more postural training on the wall as well as certain core strengthening exercises. Patient did seem to be tense today. We discussed over-the-counter medications are to be beneficial in trying a very low dose of the ibuprofen. Patient has had stomach discomfort previously though. Discuss doing is for short-term. Patient will consider. Patient come back in 3 weeks for further evaluation and treatment.

## 2014-03-16 NOTE — Patient Instructions (Addendum)
Good to see you You are tense and message would be good.  I hope you feel better Start the exercises on the wall.  Continue the turmeric Saffron 300mg  daily or powder.  Group classes would be great.  Yoga is a good start consider the cycling classes as well.  See me again in 3 weeks.

## 2014-03-16 NOTE — Assessment & Plan Note (Signed)
Patient has had sed rate previously which was normal. Patient is having some increasing back pain and I did not like the idea of the radicular symptoms. Decided the patient should have x-rays done today to further evaluate. Patient given other exercises I think would be beneficial. We discussed core strengthening exercises. Patient will follow up again in 3 weeks. Likely some of the weight gain is also contributing. Encourage her to get back into a regular exercise routine.

## 2014-04-06 ENCOUNTER — Ambulatory Visit: Payer: 59 | Admitting: Family Medicine

## 2014-04-12 ENCOUNTER — Encounter: Payer: Self-pay | Admitting: Family Medicine

## 2014-04-12 ENCOUNTER — Ambulatory Visit (INDEPENDENT_AMBULATORY_CARE_PROVIDER_SITE_OTHER): Payer: 59 | Admitting: Family Medicine

## 2014-04-12 VITALS — BP 112/72 | HR 72 | Ht 63.5 in | Wt 129.0 lb

## 2014-04-12 DIAGNOSIS — M999 Biomechanical lesion, unspecified: Secondary | ICD-10-CM

## 2014-04-12 DIAGNOSIS — M9981 Other biomechanical lesions of cervical region: Secondary | ICD-10-CM

## 2014-04-12 DIAGNOSIS — M542 Cervicalgia: Secondary | ICD-10-CM

## 2014-04-12 NOTE — Assessment & Plan Note (Signed)
Decision today to treat with OMT was based on Physical Exam  After verbal consent patient was treated with HVLA, ME, FPR techniques in cervical, rib, thoracic, lumbar and sacral areas  Patient tolerated the procedure well with improvement in symptoms  Patient given exercises, stretches and lifestyle modifications  See medications in patient instructions if given  Patient will follow up in 4 weeks   

## 2014-04-12 NOTE — Patient Instructions (Signed)
You are doing amazing!!! I got nothing.  Lets do 4 weeks.

## 2014-04-12 NOTE — Assessment & Plan Note (Signed)
Patient seems to be musculoskeletal in nature and continues to respond very well to osteopathic manipulation. Patient is doing very well we discussed about increasing the duration between appointments the patient is doing very well with four-week followup sunlight continue. Patient will continue the home exercises, icing protocol, and the possible exercises. Patient will come back again in 4 weeks

## 2014-04-12 NOTE — Progress Notes (Signed)
[name] [address] [place], [place] [address] Phone: [phone] Subjective:    CC: Neck and back pain follow up  OZH:YQMVHQIONG LARUEN RISSER is a 48 y.o. female coming in with complaint of neck and back pain. Patient was seen previously and did respond very well to osteopathic manipulation. Patient states overall she is doing relatively well. Patient states that she has had no significant worsening of her pain. Patient states that she is able to do some activity and has started to become more active. Patient has started Zumba a regular basis as well. Patient is happy with the result so far.    Past medical history, social, surgical and family history all reviewed in electronic medical record.   Review of Systems: No headache, visual changes, nausea, vomiting, diarrhea, constipation, dizziness, abdominal pain, skin rash, fevers, chills, night sweats, weight loss, swollen lymph nodes, body aches, joint swelling, muscle aches, chest pain, shortness of breath, mood changes.   Objective Blood pressure 112/72, pulse 72, height 5' 3.5" (1.613 m), weight 129 lb (58.514 kg), SpO2 98.00%.  General: No apparent distress alert and oriented x3 mood and affect normal, dressed appropriately.  HEENT: Pupils equal, extraocular movements intact  Respiratory: Patient's speak in full sentences and does not appear short of breath  Cardiovascular: No lower extremity edema, non tender, no erythema  Skin: Warm dry intact with no signs of infection or rash on extremities or on axial skeleton.  Abdomen: Soft nontender  Neuro: Cranial nerves II through XII are intact, neurovascularly intact in all extremities with 2+ DTRs and 2+ pulses.  Lymph: No lymphadenopathy of posterior or anterior cervical chain or axillae bilaterally.  Gait normal with good balance and coordination.  MSK:  Non tender with full range of motion and good stability and symmetric strength and tone of  shoulders, elbows, wrist, hip, knee and ankles bilaterally Wrist: Right Inspection normal with no visible erythema or swelling. ROM smooth and normal with good flexion and extension and ulnar/radial deviation that is symmetrical with opposite wrist. Palpation is normal over metacarpals, navicular, lunate, and TFCC; tendons without tenderness/ swelling No snuffbox tenderness. No tenderness over Canal of Guyon. Strength 5/5 in all directions without pain. Negative Finkelstein, tinel's and phalens. Negative Watson's test. nd phalens. Negative Watson's test. .  Neck: Inspection unremarkable. No palpable stepoffs. Negative Spurling's maneuver. Full neck range of motion Grip strength and sensation normal in bilateral hands Strength good C4 to T1 distribution No sensory change to C4 to T1 Negative Hoffman sign bilaterally Reflexes normal Patient does have trigger point of the left trapezius muscle  Back Exam:  Inspection: Unremarkable  Motion: Flexion 35 deg, Extension 35 deg, Side Bending to 45 deg bilaterally,  Rotation to 45 deg bilaterally  SLR laying: Negative  XSLR laying: Negative  Palpable tenderness: Mild over the left SI joint FABER: negative. Sensory change: Gross sensation intact to all lumbar and sacral dermatomes.  Reflexes: 2+ at both patellar tendons, 2+ at achilles tendons, Babinski's downgoing.  Strength at foot  Plantar-flexion: 5/5 Dorsi-flexion: 5/5 Eversion: 5/5 Inversion: 5/5  Leg strength  Quad: 5/5 Hamstring: 5/5 Hip flexor: 5/5 Hip abductors: 4/5 no change from previous exam Gait unremarkable.  OMT Physical Exam Cervical  C2 flexed rotated and side bent left C6 flexed rotated inside that right   Thoracic T1 extended rotated and side bent left with elevated first rib T3 extended rotated and side bent right   Lumbar L2 flexed rotated inside that  right  Sacrum Left on left Illium Neutral     Impression and Recommendations:     This case  required medical decision making of moderate complexity. Spent greater than 25 minutes with patient face-to-face and had greater than 50% of counseling including as described above in assessment and plan.

## 2014-05-11 ENCOUNTER — Encounter: Payer: Self-pay | Admitting: Family Medicine

## 2014-05-11 ENCOUNTER — Ambulatory Visit (INDEPENDENT_AMBULATORY_CARE_PROVIDER_SITE_OTHER): Payer: 59 | Admitting: Family Medicine

## 2014-05-11 VITALS — BP 108/70 | HR 62 | Ht 63.5 in | Wt 130.0 lb

## 2014-05-11 DIAGNOSIS — M999 Biomechanical lesion, unspecified: Secondary | ICD-10-CM

## 2014-05-11 DIAGNOSIS — M542 Cervicalgia: Secondary | ICD-10-CM

## 2014-05-11 DIAGNOSIS — M9981 Other biomechanical lesions of cervical region: Secondary | ICD-10-CM

## 2014-05-11 NOTE — Progress Notes (Signed)
  [name] [address] [place], [place] [address] Phone: [phone] Subjective:    CC: Neck and back pain follow up  BJY:NWGNFAOZHY Valerie [name] is a 48 y.o. female coming in with complaint of neck and back pain. Patient was seen previously and did respond very well to osteopathic manipulation. Patient states overall she is doing relatively well. Patient states that she has had no significant worsening of her pain. Patient is doing all activities she would like to do including going to the gym on a regular basis. Patient states that she has some mild tightness of her upper back and some mild of the lower back but overall still doing relatively well.    Past medical history, social, surgical and family history all reviewed in electronic medical record.   Review of Systems: No headache, visual changes, nausea, vomiting, diarrhea, constipation, dizziness, abdominal pain, skin rash, fevers, chills, night sweats, weight loss, swollen lymph nodes, body aches, joint swelling, muscle aches, chest pain, shortness of breath, mood changes.   Objective Blood pressure 108/70, pulse 62, height 5' 3.5" (1.613 m), weight 130 lb (58.968 kg), SpO2 99.00%.  General: No apparent distress alert and oriented x3 mood and affect normal, dressed appropriately.  HEENT: Pupils equal, extraocular movements intact  Respiratory: Patient's speak in full sentences and does not appear short of breath  Cardiovascular: No lower extremity edema, non tender, no erythema  Skin: Warm dry intact with no signs of infection or rash on extremities or on axial skeleton.  Abdomen: Soft nontender  Neuro: Cranial nerves II through XII are intact, neurovascularly intact in all extremities with 2+ DTRs and 2+ pulses.  Lymph: No lymphadenopathy of posterior or anterior cervical chain or axillae bilaterally.  Gait normal with good balance and coordination.  MSK:  Non tender with full range of motion and  good stability and symmetric strength and tone of shoulders, elbows, wrist, hip, knee and ankles bilaterally.  Neck: Inspection unremarkable. No palpable stepoffs. Negative Spurling's maneuver. Full neck range of motion Grip strength and sensation normal in bilateral hands Strength good C4 to T1 distribution No sensory change to C4 to T1 Negative Hoffman sign bilaterally Reflexes normal Patient does have trigger point of the left trapezius muscle  Back Exam:  Inspection: Unremarkable  Motion: Flexion 35 deg, Extension 35 deg, Side Bending to 45 deg bilaterally,  Rotation to 45 deg bilaterally  SLR laying: Negative  XSLR laying: Negative  Palpable tenderness: Nontender exam FABER: negative. Sensory change: Gross sensation intact to all lumbar and sacral dermatomes.  Reflexes: 2+ at both patellar tendons, 2+ at achilles tendons, Babinski's downgoing.  Strength at foot  Plantar-flexion: 5/5 Dorsi-flexion: 5/5 Eversion: 5/5 Inversion: 5/5  Leg strength  Quad: 5/5 Hamstring: 5/5 Hip flexor: 5/5 Hip abductors: 5/5 improvement from previous exam. Gait unremarkable.  OMT Physical Exam Cervical  C2 flexed rotated and side bent left   Thoracic T1 extended rotated and side bent left  T3 extended rotated and side bent right  T5 extended rotated and side bent left  Lumbar L2 flexed rotated inside that right  Sacrum Left on left Illium Neutral     Impression and Recommendations:     This case required medical decision making of moderate complexity.

## 2014-05-11 NOTE — Assessment & Plan Note (Signed)
Patient continues to do significantly better with the radicular symptoms as well as with the exercises she is doing. Encourage her to continue the regular routine as well as the over-the-counter medications. Patient will decrease the duration and come and see me only every

## 2014-05-11 NOTE — Patient Instructions (Addendum)
Nothing new you are doing great.  Good luck with call See me in 6 weeks.

## 2014-05-11 NOTE — Assessment & Plan Note (Signed)
Decision today to treat with OMT was based on Physical Exam  After verbal consent patient was treated with HVLA, ME, FPR techniques in cervical, rib, thoracic, lumbar and sacral areas  Patient tolerated the procedure well with improvement in symptoms  Patient given exercises, stretches and lifestyle modifications  See medications in patient instructions if given  Patient will follow up in 6 weeks 

## 2014-06-22 ENCOUNTER — Encounter: Payer: Self-pay | Admitting: Family Medicine

## 2014-06-22 ENCOUNTER — Ambulatory Visit (INDEPENDENT_AMBULATORY_CARE_PROVIDER_SITE_OTHER): Payer: 59 | Admitting: Family Medicine

## 2014-06-22 VITALS — BP 108/72 | HR 80 | Ht 63.5 in | Wt 128.0 lb

## 2014-06-22 DIAGNOSIS — M9901 Segmental and somatic dysfunction of cervical region: Secondary | ICD-10-CM

## 2014-06-22 DIAGNOSIS — M9903 Segmental and somatic dysfunction of lumbar region: Secondary | ICD-10-CM

## 2014-06-22 DIAGNOSIS — M999 Biomechanical lesion, unspecified: Secondary | ICD-10-CM

## 2014-06-22 DIAGNOSIS — M9904 Segmental and somatic dysfunction of sacral region: Secondary | ICD-10-CM

## 2014-06-22 DIAGNOSIS — M542 Cervicalgia: Secondary | ICD-10-CM

## 2014-06-22 NOTE — Assessment & Plan Note (Signed)
Patient is doing relatively well with the conservative therapy. I do think that the postural exercises have been very beneficial. We discussed different upper back musculature strengthening exercises that will also be helpful. We discussed an icing regimen. We discussed what activities potentially avoid. Impressed with the amount of strengthening in her mid back that I think his has been helpful. Patient then is going to come back and see me again in 6 week intervals.

## 2014-06-22 NOTE — Progress Notes (Signed)
  [facility] D[facility] [address] [place], [place] [address] Phone: [phone] Subjective:    CC: Neck and back pain follow up  BJY:NWGNFAOZHYHPI:Subjective Valerie [name] is a 48 y.o. female coming in with complaint of neck and back pain. Patient was seen previously and did respond very well to osteopathic manipulation. Patient is continued conservative therapy with over-the-counter medications as well.  Patient actually has not had osteopathic manipulation for 6 weeks.patient states that she has no worsening of pain. Patient states that overall she same discomfort as she's had previously. Has been going to the gym more often and has noticed some increase in strength of her back which has been very helpful she thinks.    Past medical history, social, surgical and family history all reviewed in electronic medical record.   Review of Systems: No headache, visual changes, nausea, vomiting, diarrhea, constipation, dizziness, abdominal pain, skin rash, fevers, chills, night sweats, weight loss, swollen lymph nodes, body aches, joint swelling, muscle aches, chest pain, shortness of breath, mood changes.   Objective Blood pressure 108/72, pulse 80, height 5' 3.5" (1.613 m), weight 128 lb (58.06 kg), SpO2 96 %.  General: No apparent distress alert and oriented x3 mood and affect normal, dressed appropriately.  HEENT: Pupils equal, extraocular movements intact  Respiratory: Patient's speak in full sentences and does not appear short of breath  Cardiovascular: No lower extremity edema, non tender, no erythema  Skin: Warm dry intact with no signs of infection or rash on extremities or on axial skeleton.  Abdomen: Soft nontender  Neuro: Cranial nerves II through XII are intact, neurovascularly intact in all extremities with 2+ DTRs and 2+ pulses.  Lymph: No lymphadenopathy of posterior or anterior cervical chain or axillae bilaterally.  Gait normal with good balance and coordination.  MSK:   Non tender with full range of motion and good stability and symmetric strength and tone of shoulders, elbows, wrist, hip, knee and ankles bilaterally.  Neck: Inspection unremarkable. No palpable stepoffs. Negative Spurling's maneuver. Full neck range of motion Grip strength and sensation normal in bilateral hands Strength good C4 to T1 distribution No sensory change to C4 to T1 Negative Hoffman sign bilaterally Reflexes normal Patient does have trigger point of the left trapezius muscle  Back Exam:  Inspection: Unremarkable  Motion: Flexion 35 deg, Extension 35 deg, Side Bending to 45 deg bilaterally,  Rotation to 45 deg bilaterally  SLR laying: Negative  XSLR laying: Negative  Palpable tenderness: Nontender exam FABER: negative. Sensory change: Gross sensation intact to all lumbar and sacral dermatomes.  Reflexes: 2+ at both patellar tendons, 2+ at achilles tendons, Babinski's downgoing.  Strength at foot  Plantar-flexion: 5/5 Dorsi-flexion: 5/5 Eversion: 5/5 Inversion: 5/5  Leg strength  Quad: 5/5 Hamstring: 5/5 Hip flexor: 5/5 Hip abductors: 5/5 improvement from previous exam. Gait unremarkable.  OMT Physical Exam Cervical  C2 flexed rotated and side bent left C4 flexed rotated and side bent right   Thoracic T1 extended rotated and side bent left  T3 extended rotated and side bent right with inhaled third rib T5 extended rotated and side bent left  Lumbar L2 flexed rotated inside that right  Sacrum Left on left Illium Neutral     Impression and Recommendations:     This case required medical decision making of moderate complexity.

## 2014-06-22 NOTE — Patient Instructions (Signed)
Good to see you You are doing great  Continue to focus on the posture.  Your mid back is better and stronger and now focus on the upper back as well.  Lets continue 6 weeks intervals.

## 2014-06-22 NOTE — Assessment & Plan Note (Signed)
Decision today to treat with OMT was based on Physical Exam  After verbal consent patient was treated with HVLA, ME, FPR techniques in cervical, rib, thoracic, lumbar and sacral areas  Patient tolerated the procedure well with improvement in symptoms  Patient given exercises, stretches and lifestyle modifications  See medications in patient instructions if given  Patient will follow up in 6 weeks 

## 2014-07-26 ENCOUNTER — Other Ambulatory Visit: Payer: Self-pay

## 2014-07-26 DIAGNOSIS — Z1231 Encounter for screening mammogram for malignant neoplasm of breast: Secondary | ICD-10-CM

## 2014-07-28 ENCOUNTER — Ambulatory Visit
Admission: RE | Admit: 2014-07-28 | Discharge: 2014-07-28 | Disposition: A | Discharge status: Home / Self Care | Payer: 59 | Source: Ambulatory Visit

## 2014-07-28 DIAGNOSIS — Z1231 Encounter for screening mammogram for malignant neoplasm of breast: Secondary | ICD-10-CM

## 2014-07-29 ENCOUNTER — Ambulatory Visit (INDEPENDENT_AMBULATORY_CARE_PROVIDER_SITE_OTHER): Payer: 59 | Admitting: Family Medicine

## 2014-07-29 ENCOUNTER — Encounter: Payer: Self-pay | Admitting: Family Medicine

## 2014-07-29 VITALS — BP 108/72 | HR 87 | Ht 63.5 in | Wt 129.0 lb

## 2014-07-29 DIAGNOSIS — M9903 Segmental and somatic dysfunction of lumbar region: Secondary | ICD-10-CM

## 2014-07-29 DIAGNOSIS — M999 Biomechanical lesion, unspecified: Secondary | ICD-10-CM

## 2014-07-29 DIAGNOSIS — M542 Cervicalgia: Secondary | ICD-10-CM

## 2014-07-29 DIAGNOSIS — M9901 Segmental and somatic dysfunction of cervical region: Secondary | ICD-10-CM

## 2014-07-29 DIAGNOSIS — M9904 Segmental and somatic dysfunction of sacral region: Secondary | ICD-10-CM

## 2014-07-29 NOTE — Patient Instructions (Addendum)
Good to see you.   Happy holidays!  Good luck with the pillow Have fun shopping.  See me again in 4-6 weeks.

## 2014-07-29 NOTE — Assessment & Plan Note (Signed)
Decision today to treat with OMT was based on Physical Exam  After verbal consent patient was treated with HVLA, ME, FPR techniques in cervical, rib, thoracic, lumbar and sacral areas more muscle energy within the cervical spine.  Patient tolerated the procedure well with improvement in symptoms  Patient given exercises, stretches and lifestyle modifications  See medications in patient instructions if given  Patient will follow up in 6 weeks

## 2014-07-29 NOTE — Progress Notes (Signed)
  [facility] Kanaan Kagawa D.O. [facility] [address] [place], [place] [address] Phone: [phone]([phone] Subjective:    CC: Neck and back pain follow up  BJY:NWGNFAOZHYHPI:Subjective Valerie Creeamela S [name] is a 48 y.o. female coming in with complaint of neck and back pain. Patient was seen previously and did respond very well to osteopathic manipulation. Patient overall has been doing relatively well. The little more thoracic pain. Patient has been having some mild increased stress secondary to the holiday season's. Patient continues with the over-the-counter medications. Overall patient feels like she is doing relatively well. States that after last manipulation she did have some tingling in her arms but went away after minutes.    Past medical history, social, surgical and family history all reviewed in electronic medical record.   Review of Systems: No headache, visual changes, nausea, vomiting, diarrhea, constipation, dizziness, abdominal pain, skin rash, fevers, chills, night sweats, weight loss, swollen lymph nodes, body aches, joint swelling, muscle aches, chest pain, shortness of breath, mood changes.   Objective Blood pressure 108/72, pulse 87, height 5' 3.5" (1.613 m), weight 129 lb (58.514 kg), SpO2 98 %.  General: No apparent distress alert and oriented x3 mood and affect normal, dressed appropriately.  HEENT: Pupils equal, extraocular movements intact  Respiratory: Patient's speak in full sentences and does not appear short of breath  Cardiovascular: No lower extremity edema, non tender, no erythema  Skin: Warm dry intact with no signs of infection or rash on extremities or on axial skeleton.  Abdomen: Soft nontender  Neuro: Cranial nerves II through XII are intact, neurovascularly intact in all extremities with 2+ DTRs and 2+ pulses.  Lymph: No lymphadenopathy of posterior or anterior cervical chain or axillae bilaterally.  Gait normal with good balance and coordination.  MSK:  Non tender with full  range of motion and good stability and symmetric strength and tone of shoulders, elbows, wrist, hip, knee and ankles bilaterally.  Neck: Inspection unremarkable. No palpable stepoffs. Negative Spurling's maneuver. Full neck range of motion Grip strength and sensation normal in bilateral hands Strength good C4 to T1 distribution No sensory change to C4 to T1 Negative Hoffman sign bilaterally Reflexes normal Patient does have trigger point of the left trapezius muscle  Back Exam:  Inspection: Unremarkable  Motion: Flexion 35 deg, Extension 35 deg, Side Bending to 45 deg bilaterally,  Rotation to 45 deg bilaterally  SLR laying: Negative  XSLR laying: Negative  Palpable tenderness: Nontender exam FABER: negative. Sensory change: Gross sensation intact to all lumbar and sacral dermatomes.  Reflexes: 2+ at both patellar tendons, 2+ at achilles tendons, Babinski's downgoing.  Strength at foot  Plantar-flexion: 5/5 Dorsi-flexion: 5/5 Eversion: 5/5 Inversion: 5/5  Leg strength  Quad: 5/5 Hamstring: 5/5 Hip flexor: 5/5 Hip abductors: 5/5 improvement from previous exam. Gait unremarkable.  OMT Physical Exam Cervical  C2 flexed rotated and side bent left C4 flexed rotated and side bent right   Thoracic T1 extended rotated and side bent left with elevated first rib T3 extended rotated and side bent right with inhaled third rib T5 extended rotated and side bent left  Lumbar L2 flexed rotated inside that right  Sacrum Left on left Illium Right posterior     Impression and Recommendations:     This case required medical decision making of moderate complexity.

## 2014-07-29 NOTE — Assessment & Plan Note (Signed)
Continues to respond fairly well to conservative therapy as well as manipulation. We change patient's manipulation to muscle energy of the cervical spine. Patient is doing much better with this. We discussed icing regimen. We discussed continuing the home exercises and the postural changes. Patient will come back in 4-6 weeks for further evaluation and treatment.

## 2014-08-02 ENCOUNTER — Ambulatory Visit: Payer: 59 | Admitting: Family Medicine

## 2014-08-03 ENCOUNTER — Ambulatory Visit: Payer: 59 | Admitting: Family Medicine

## 2014-08-31 ENCOUNTER — Ambulatory Visit: Payer: 59 | Admitting: Family Medicine

## 2014-09-21 ENCOUNTER — Encounter: Payer: Self-pay | Admitting: Family Medicine

## 2014-09-21 ENCOUNTER — Ambulatory Visit (INDEPENDENT_AMBULATORY_CARE_PROVIDER_SITE_OTHER): Payer: 59 | Admitting: Family Medicine

## 2014-09-21 VITALS — BP 110/68 | HR 84 | Ht 63.5 in | Wt 129.0 lb

## 2014-09-21 DIAGNOSIS — M9904 Segmental and somatic dysfunction of sacral region: Secondary | ICD-10-CM

## 2014-09-21 DIAGNOSIS — M9903 Segmental and somatic dysfunction of lumbar region: Secondary | ICD-10-CM

## 2014-09-21 DIAGNOSIS — M9901 Segmental and somatic dysfunction of cervical region: Secondary | ICD-10-CM

## 2014-09-21 DIAGNOSIS — M999 Biomechanical lesion, unspecified: Secondary | ICD-10-CM

## 2014-09-21 DIAGNOSIS — M542 Cervicalgia: Secondary | ICD-10-CM

## 2014-09-21 DIAGNOSIS — M9902 Segmental and somatic dysfunction of thoracic region: Secondary | ICD-10-CM

## 2014-09-21 NOTE — Progress Notes (Signed)
  [facility] D[facility] [address] [place], [place] [address] Phone: [phone] Subjective:    CC: Neck and back pain follow up  BJY:NWGNFAOZHYHPI:Subjective Jacquelynn Creeamela S Lisle is a 49 y.o. female coming in with complaint of neck and back pain. Patient was seen previously and did respond very well to osteopathic manipulation.  Patient was doing significantly better but did get a cold recently. Patient did have coughing. This did cause more upper back and neck pain. Patient states that it is more of a dull throbbing sensation. No pain that is new but unfortunately worsening of previous symptoms. Denies any radiation to the extremities.    Past medical history, social, surgical and family history all reviewed in electronic medical record.   Review of Systems: No headache, visual changes, nausea, vomiting, diarrhea, constipation, dizziness, abdominal pain, skin rash, fevers, chills, night sweats, weight loss, swollen lymph nodes, body aches, joint swelling, muscle aches, chest pain, shortness of breath, mood changes.   Objective Blood pressure 110/68, pulse 84, height 5' 3.5" (1.613 m), weight 129 lb (58.514 kg), SpO2 98 %.  General: No apparent distress alert and oriented x3 mood and affect normal, dressed appropriately.  HEENT: Pupils equal, extraocular movements intact  Respiratory: Patient's speak in full sentences and does not appear short of breath  Cardiovascular: No lower extremity edema, non tender, no erythema  Skin: Warm dry intact with no signs of infection or rash on extremities or on axial skeleton.  Abdomen: Soft nontender  Neuro: Cranial nerves II through XII are intact, neurovascularly intact in all extremities with 2+ DTRs and 2+ pulses.  Lymph: No lymphadenopathy of posterior or anterior cervical chain or axillae bilaterally.  Gait normal with good balance and coordination.  MSK:  Non tender with full range of motion and good stability and symmetric strength and tone of  shoulders, elbows, wrist, hip, knee and ankles bilaterally.  Neck: Inspection unremarkable. No palpable stepoffs. Negative Spurling's maneuver. Full neck range of motion Grip strength and sensation normal in bilateral hands Strength good C4 to T1 distribution No sensory change to C4 to T1 Negative Hoffman sign bilaterally Reflexes normal Increased tightness of the paraspinal musculature of the cervical spine bilaterally.  Back Exam:  Inspection: Unremarkable  Motion: Flexion 35 deg, Extension 35 deg, Side Bending to 45 deg bilaterally,  Rotation to 45 deg bilaterally  SLR laying: Negative  XSLR laying: Negative  Palpable tenderness: Nontender exam FABER: negative. Sensory change: Gross sensation intact to all lumbar and sacral dermatomes.  Reflexes: 2+ at both patellar tendons, 2+ at achilles tendons, Babinski's downgoing.  Strength at foot  Plantar-flexion: 5/5 Dorsi-flexion: 5/5 Eversion: 5/5 Inversion: 5/5  Leg strength  Quad: 5/5 Hamstring: 5/5 Hip flexor: 5/5 Hip abductors: 5/5 improvement from previous exam. Gait unremarkable.  OMT Physical Exam Cervical  C2 flexed rotated and side bent left C4 flexed rotated and side bent right   Thoracic T1 extended rotated and side bent left with elevated first rib T3 extended rotated and side bent right with inhaled third rib T5 extended rotated and side bent left  Lumbar L2 flexed rotated inside that right  Sacrum Left on left Illium Right posterior     Impression and Recommendations:     This case required medical decision making of moderate complexity.

## 2014-09-21 NOTE — Patient Instructions (Addendum)
Good to see you! Ice will always be your friend.  Continue the vitamins.  Definitely the posture exercises.  Look up omega 3 and omega 6 foods. You want a high diet of omega 3s and a low of omega 6.  See me again in 4-6 weeks.

## 2014-09-21 NOTE — Assessment & Plan Note (Signed)
Patient's is still multifactorial. Patient has not been doing the exercises on a regular basis. We discussed home exercises and icing protocol again. We discussed the importance of the postural changes for patient to make. Patient is to start becoming more active. Patient has gained 10 pounds over the course of last year. Patient will try to make these changes and come back and see me in 3-4 weeks.

## 2014-09-21 NOTE — Assessment & Plan Note (Signed)
Decision today to treat with OMT was based on Physical Exam  After verbal consent patient was treated with HVLA, ME, FPR techniques in cervical, rib, thoracic, lumbar and sacral areas more muscle energy within the cervical spine.  Patient tolerated the procedure well with improvement in symptoms  Patient given exercises, stretches and lifestyle modifications  See medications in patient instructions if given  Patient will follow up in 4  weeks

## 2014-09-21 NOTE — Progress Notes (Signed)
Pre visit review using our clinic review tool, if applicable. No additional management support is needed unless otherwise documented below in the visit note. 

## 2014-10-19 ENCOUNTER — Encounter: Payer: Self-pay | Admitting: Family Medicine

## 2014-10-19 ENCOUNTER — Ambulatory Visit (INDEPENDENT_AMBULATORY_CARE_PROVIDER_SITE_OTHER): Payer: 59 | Admitting: Family Medicine

## 2014-10-19 VITALS — BP 124/82 | HR 73 | Ht 63.5 in | Wt 129.0 lb

## 2014-10-19 DIAGNOSIS — M542 Cervicalgia: Secondary | ICD-10-CM

## 2014-10-19 DIAGNOSIS — M9901 Segmental and somatic dysfunction of cervical region: Secondary | ICD-10-CM

## 2014-10-19 DIAGNOSIS — M9904 Segmental and somatic dysfunction of sacral region: Secondary | ICD-10-CM

## 2014-10-19 DIAGNOSIS — M999 Biomechanical lesion, unspecified: Secondary | ICD-10-CM

## 2014-10-19 DIAGNOSIS — M9903 Segmental and somatic dysfunction of lumbar region: Secondary | ICD-10-CM

## 2014-10-19 NOTE — Progress Notes (Signed)
Pre visit review using our clinic review tool, if applicable. No additional management support is needed unless otherwise documented below in the visit note. 

## 2014-10-19 NOTE — Progress Notes (Signed)
  Valerie [name] D.O. [facility] [name] [place], [place] [address] Phone: [phone] Subjective:    CC: Neck and back pain follow up  BJY:NWGNFAOZHYHPI:Subjective Valerie [name] is a 49 y.o. female coming in with complaint of neck and back pain. Overall doing well, has increased exercising and feeling much better, more energy and less pain.  No new symptoms at this time, no radiation of pain. States some mild increase in mid back pain.    Past medical history, social, surgical and family history all reviewed in electronic medical record.   Review of Systems: No headache, visual changes, nausea, vomiting, diarrhea, constipation, dizziness, abdominal pain, skin rash, fevers, chills, night sweats, weight loss, swollen lymph nodes, body aches, joint swelling, muscle aches, chest pain, shortness of breath, mood changes.   Objective Blood pressure 124/82, pulse 73, height 5' 3.5" (1.613 m), weight 129 lb (58.514 kg), SpO2 99 %.  General: No apparent distress alert and oriented x3 mood and affect normal, dressed appropriately.  HEENT: Pupils equal, extraocular movements intact  Respiratory: Patient's speak in full sentences and does not appear short of breath  Cardiovascular: No lower extremity edema, non tender, no erythema  Skin: Warm dry intact with no signs of infection or rash on extremities or on axial skeleton.  Abdomen: Soft nontender  Neuro: Cranial nerves II through XII are intact, neurovascularly intact in all extremities with 2+ DTRs and 2+ pulses.  Lymph: No lymphadenopathy of posterior or anterior cervical chain or axillae bilaterally.  Gait normal with good balance and coordination.  MSK:  Non tender with full range of motion and good stability and symmetric strength and tone of shoulders, elbows, wrist, hip, knee and ankles bilaterally.  Neck: Inspection unremarkable. No palpable stepoffs. Negative Spurling's maneuver. Full neck range of motion Grip strength and sensation  normal in bilateral hands Strength good C4 to T1 distribution No sensory change to C4 to T1 Negative Hoffman sign bilaterally Reflexes normal Increased tightness of the paraspinal musculature of the cervical spine bilaterally.  Back Exam:  Inspection: Unremarkable  Motion: Flexion 45 deg improved, Extension 35 deg, Side Bending to 45 deg bilaterally,  Rotation to 45 deg bilaterally  SLR laying: Negative  XSLR laying: Negative  Palpable tenderness: Nontender exam FABER: negative. Sensory change: Gross sensation intact to all lumbar and sacral dermatomes.  Reflexes: 2+ at both patellar tendons, 2+ at achilles tendons, Babinski's downgoing.  Strength at foot  Plantar-flexion: 5/5 Dorsi-flexion: 5/5 Eversion: 5/5 Inversion: 5/5  Leg strength  Quad: 5/5 Hamstring: 5/5 Hip flexor: 5/5 Hip abductors: 5/5 improvement from previous exam. Gait unremarkable.  OMT Physical Exam Cervical  C2 flexed rotated and side bent left C4 flexed rotated and side bent right   Thoracic T1 extended rotated and side bent left with elevated first rib T3 extended rotated and side bent right with inhaled third rib T5 extended rotated and side bent left  Lumbar L2 flexed rotated inside that right  Sacrum Left on left Illium Right posterior     Impression and Recommendations:     This case required medical decision making of moderate complexity.

## 2014-10-19 NOTE — Assessment & Plan Note (Signed)
Decision today to treat with OMT was based on Physical Exam  After verbal consent patient was treated with HVLA, ME, FPR techniques in cervical, rib, thoracic, lumbar and sacral areas more muscle energy within the cervical spine.  Patient tolerated the procedure well with improvement in symptoms  Patient given exercises, stretches and lifestyle modifications  See medications in patient instructions if given  Patient will follow up in 4-6 weeks                                     

## 2014-10-19 NOTE — Assessment & Plan Note (Signed)
Patient is doing significantly better with conservative therapy at this time. We discussed icing regimen and the home exercises. We discussed more postural changes. Encourage patient stay of active as she is with her having increasing muscle tone and better posture. Patient then will come back and see me again in 4-6 weeks for further evaluation and treat

## 2014-10-19 NOTE — Patient Instructions (Addendum)
Good to see you I can see you've have been working hard Continue what you are doing Consider biotin.  See me again in 4-6 weeks.

## 2014-10-26 ENCOUNTER — Other Ambulatory Visit: Payer: Self-pay | Admitting: Family Medicine

## 2014-10-26 ENCOUNTER — Telehealth: Payer: Self-pay | Admitting: Internal Medicine

## 2014-10-26 DIAGNOSIS — Z0000 Encounter for general adult medical examination without abnormal findings: Secondary | ICD-10-CM

## 2014-10-26 NOTE — Telephone Encounter (Signed)
Patient would like to go to [facility] to have CPX labs drawn.  Can you please enter the orders?

## 2014-10-26 NOTE — Telephone Encounter (Signed)
Orders entered and pt notified by telephone.

## 2014-11-16 ENCOUNTER — Ambulatory Visit: Payer: 59 | Admitting: Family Medicine

## 2014-11-24 ENCOUNTER — Ambulatory Visit (INDEPENDENT_AMBULATORY_CARE_PROVIDER_SITE_OTHER): Payer: 59 | Admitting: Family Medicine

## 2014-11-24 ENCOUNTER — Encounter: Payer: Self-pay | Admitting: Family Medicine

## 2014-11-24 VITALS — BP 100/82 | HR 89 | Temp 97.5°F | Ht 63.5 in | Wt 130.5 lb

## 2014-11-24 DIAGNOSIS — L239 Allergic contact dermatitis, unspecified cause: Secondary | ICD-10-CM

## 2014-11-24 DIAGNOSIS — R21 Rash and other nonspecific skin eruption: Secondary | ICD-10-CM | POA: Diagnosis not present

## 2014-11-24 DIAGNOSIS — L200 Besnier's prurigo: Secondary | ICD-10-CM | POA: Diagnosis not present

## 2014-11-24 MED ORDER — TRIAMCINOLONE ACETONIDE 0.1 % EX CREA: 1.0000 "application " | TOPICAL_CREAM | Freq: Two times a day (BID) | CUTANEOUS | Status: DC

## 2014-11-24 MED ORDER — MUPIROCIN 2 % EX OINT: 1.0000 "application " | TOPICAL_OINTMENT | Freq: Two times a day (BID) | CUTANEOUS | Status: DC

## 2014-11-24 NOTE — Progress Notes (Signed)
Pre visit review using our clinic review tool, if applicable. No additional management support is needed unless otherwise documented below in the visit note. 

## 2014-11-24 NOTE — Progress Notes (Signed)
HPI:  Valerie [name] is a patient of Dr. Fabian Sharp here for an acute visit for:  Rash: -started 1.5 weeks ago -did yard work and had some scratches and then applied tea tree oil to bandaide and now has rectangular itchy papulovesicular rash in rectangle and in other area where she applied tea tree oil -she did stick a sterile needle in one lesion  ROS: See pertinent positives and negatives per HPI.  Past Medical History  Diagnosis Date  . Anemia     NOS  . GERD (gastroesophageal reflux disease)   . HSV infection     labialis  . 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 Allergy     cats  . RECTAL BLEEDING 07/10/2007    Qualifier: Diagnosis of  By: Fabian Sharp MD, Neta Mends 2008 colonoscopy [facility].     Past Surgical History  Procedure Laterality Date  . Lasik  2009    mono vision  . Dilitation & currettage/hystroscopy with versapoint resection  08/27/2012    Procedure: DILATATION & CURETTAGE/HYSTEROSCOPY WITH VERSAPOINT RESECTION;  Surgeon: Genia Del, MD;  Location: [facility];  Service: Gynecology;;    Family History  Problem Relation Age of Onset  . Colon polyps Brother     elev TG  . Alcohol abuse Other   . Breast cancer Maternal Aunt   . Diabetes Father   . Hyperlipidemia Father   . Heart disease Father   . Stroke Maternal Grandmother   . Diabetes Maternal Grandmother   . Heart disease Maternal Grandmother   . Diabetes Maternal Grandfather   . Hyperlipidemia Brother     History   Social History  . Marital Status: Married    Spouse Name: N/A  . Number of Children: N/A  . Years of Education: N/A   Occupational History  . PA [employer]   Social History Main Topics  . Smoking status: Never Smoker   . Smokeless tobacco: Never Used  . Alcohol Use: No  . Drug Use: No  . Sexual Activity: Not on file   Other Topics Concern  . 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  b complex vitamins capsule, Take 1 capsule by mouth daily. , Disp: , Rfl:  .  cholecalciferol (VITAMIN D) 1000 UNITS tablet, Take 1,000 Units by mouth daily., Disp: , Rfl:  .  diphenhydrAMINE (BENADRYL) 25 MG tablet, Take 25 mg by mouth every 6 (six) hours as needed. For post-nasal drip, Disp: , Rfl:  .  levocetirizine (XYZAL) 5 MG tablet, Take 5 mg by mouth daily as needed. For post-nasal drip, Disp: , Rfl:  .  Methylsulfonylmethane (MSM) 1000 MG CAPS, Take by mouth., Disp: , Rfl:  .  Multiple Vitamin (MULTIVITAMIN WITH MINERALS) TABS, Take 1 tablet by mouth daily., Disp: , Rfl:  .  Omega-3 Fatty Acids (FISH OIL) 1200 MG CAPS, Take 1,200 mg by mouth daily., Disp: , Rfl:  .  PRESCRIPTION MEDICATION, Inject 1 each into the skin once a week. Allergy injections, Disp: , Rfl:  .  VALTREX 1 G tablet, Take 2 TABLETS BY MOUTH AND REPEATIN 12 HOURS OR AS DIRECTED, Disp: 30 tablet, Rfl: 4 .  mupirocin  ointment (BACTROBAN) 2 %, Place 1 application into the nose 2 (two) times daily., Disp: 22 g, Rfl: 0 .  triamcinolone cream (KENALOG) 0.1 %, Apply 1 application topically 2 (two) times daily., Disp: 30 g, Rfl: 0  EXAM:  Filed Vitals:   11/24/14 1509  BP: 100/82  Pulse: 89  Temp: 97.5 F (36.4 C)    Body mass index is 22.75 kg/(m^2).  GENERAL: vitals reviewed and listed above, alert, oriented, appears well hydrated and in no acute distress  HEENT: atraumatic, conjunttiva clear, no obvious abnormalities on inspection of external nose and ears  NECK: no obvious masses on inspection  SKIN: patch of raise, erythematous, papulovesicular eruption in perfect rectangle the size of a small bandaide and in streak and iregualar adjacent patch (approx 3cm in diameter) on R forearm, minimal crusting of second patch in one location  MS: moves all extremities without noticeable  abnormality  PSYCH: pleasant and cooperative, no obvious depression or anxiety  ASSESSMENT AND PLAN:  Discussed the following assessment and plan:  Rash and nonspecific skin eruption - Plan: mupirocin ointment (BACTROBAN) 2 %  Allergic dermatitis - Plan: triamcinolone cream (KENALOG) 0.1 %  -suspect reaction to tea tree oil given the distribution of the rash -do not suspect infection but with minimal crusting in one area and hx needling in this area will do abx ointment along with mod pot steroid - avoid other topical exposures -follow up if worsens or persists  -Patient advised to return or notify a doctor immediately if symptoms worsen or persist or new concerns arise.  There are no Patient Instructions on file for this visit.   Kriste BasqueKIM, Kieli Golladay R.

## 2014-11-29 ENCOUNTER — Other Ambulatory Visit (INDEPENDENT_AMBULATORY_CARE_PROVIDER_SITE_OTHER): Payer: 59

## 2014-11-29 DIAGNOSIS — Z0000 Encounter for general adult medical examination without abnormal findings: Secondary | ICD-10-CM | POA: Diagnosis not present

## 2014-11-29 LAB — CBC WITH DIFFERENTIAL/PLATELET
BASOS ABS: 0 10*3/uL (ref 0.0–0.1)
Basophils Relative: 0.3 % (ref 0.0–3.0)
Eosinophils Absolute: 0.1 10*3/uL (ref 0.0–0.7)
Eosinophils Relative: 2.7 % (ref 0.0–5.0)
HCT: 38.9 % (ref 36.0–46.0)
Hemoglobin: 13.4 g/dL (ref 12.0–15.0)
LYMPHS PCT: 40.2 % (ref 12.0–46.0)
Lymphs Abs: 1.4 10*3/uL (ref 0.7–4.0)
MCHC: 34.4 g/dL (ref 30.0–36.0)
MCV: 87.8 fl (ref 78.0–100.0)
MONOS PCT: 6 % (ref 3.0–12.0)
Monocytes Absolute: 0.2 10*3/uL (ref 0.1–1.0)
NEUTROS PCT: 50.8 % (ref 43.0–77.0)
Neutro Abs: 1.8 10*3/uL (ref 1.4–7.7)
PLATELETS: 188 10*3/uL (ref 150.0–400.0)
RBC: 4.44 Mil/uL (ref 3.87–5.11)
RDW: 12.3 % (ref 11.5–15.5)
WBC: 3.6 10*3/uL — AB (ref 4.0–10.5)

## 2014-11-29 LAB — BASIC METABOLIC PANEL
BUN: 15 mg/dL (ref 6–23)
CHLORIDE: 105 meq/L (ref 96–112)
CO2: 27 meq/L (ref 19–32)
Calcium: 9 mg/dL (ref 8.4–10.5)
Creatinine, Ser: 0.81 mg/dL (ref 0.40–1.20)
GFR: 79.61 mL/min (ref >60.00)
Glucose, Bld: 84 mg/dL (ref 70–99)
POTASSIUM: 3.9 meq/L (ref 3.5–5.1)
SODIUM: 136 meq/L (ref 135–145)

## 2014-11-29 LAB — HEPATIC FUNCTION PANEL
ALBUMIN: 3.9 g/dL (ref 3.5–5.2)
ALT: 22 U/L (ref 0–35)
AST: 22 U/L (ref 0–37)
Alkaline Phosphatase: 60 U/L (ref 39–117)
BILIRUBIN TOTAL: 0.4 mg/dL (ref 0.2–1.2)
Bilirubin, Direct: 0.1 mg/dL (ref 0.0–0.3)
Total Protein: 6.8 g/dL (ref 6.0–8.3)

## 2014-11-29 LAB — TSH: TSH: 1.76 u[IU]/mL (ref 0.35–4.50)

## 2014-11-29 LAB — LIPID PANEL
CHOLESTEROL: 197 mg/dL (ref 0–200)
HDL: 69.6 mg/dL (ref >39.00)
LDL Cholesterol: 118 mg/dL — ABNORMAL HIGH (ref 0–99)
NonHDL: 127.4
TRIGLYCERIDES: 47 mg/dL (ref 0.0–149.0)
Total CHOL/HDL Ratio: 3
VLDL: 9.4 mg/dL (ref 0.0–40.0)

## 2014-12-05 ENCOUNTER — Ambulatory Visit (INDEPENDENT_AMBULATORY_CARE_PROVIDER_SITE_OTHER): Payer: 59 | Admitting: Internal Medicine

## 2014-12-05 ENCOUNTER — Encounter: Payer: Self-pay | Admitting: Internal Medicine

## 2014-12-05 VITALS — BP 110/80 | HR 64 | Temp 98.4°F | Resp 18 | Ht 63.5 in | Wt 132.0 lb

## 2014-12-05 DIAGNOSIS — Z23 Encounter for immunization: Secondary | ICD-10-CM

## 2014-12-05 DIAGNOSIS — Z0000 Encounter for general adult medical examination without abnormal findings: Secondary | ICD-10-CM | POA: Diagnosis not present

## 2014-12-05 MED ORDER — CITALOPRAM HYDROBROMIDE 10 MG PO TABS: 10.0000 mg | ORAL_TABLET | Freq: Every day | ORAL | Status: DC

## 2014-12-05 NOTE — Progress Notes (Signed)
Chief Complaint  Patient presents with  . Annual Exam    HPI: Patient  Valerie [name]  49 y.o. 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   Health Maintenance  Topic Date Due  . HIV Screening  02/07/1980  . INFLUENZA VACCINE  03/20/2015  . PAP SMEAR  08/19/2016  . TETANUS/TDAP  12/04/2024   Health Maintenance Review LIFESTYLE:  Exercise:  ocass  2 x per week  Tobacco/ETS:  no Alcohol:  4 x per year  Sugar beverages: 2-3 per day .  Sleep:  Trying to get 6-7 hours  Drug use: no Colonoscopy: neg fam hx .    PAP:utd MAMMO:jan ok  tdap  Got  At work.    ROS:  GEN/ HEENT: No fever, significant weight changes sweats headaches vision problems hearing changes, CV/ PULM; No chest pain shortness of breath cough, syncope,edema  change in exercise tolerance. GI /GU: No adominal pain, vomiting, change in bowel habits. No blood in the stool. No significant GU symptoms. SKIN/HEME: ,no acute skin rashes suspicious lesions or bleeding. No lymphadenopathy, nodules, masses.  NEURO/ PSYCH:  No neurologic signs such as weakness numbness.   Tearful recently disc support fro family and  Sisters  Who lives in [place] getting a nasty divorce  IMM/ Allergy: No unusual infections.  Allergy .   On shots  Not taking nose sprays has a thing with these REST of 12 system review negative except as per HPI   Past Medical History  Diagnosis Date  . Anemia     NOS  . GERD (gastroesophageal reflux disease)   . HSV infection     labialis  . 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 Allergy     cats  . RECTAL BLEEDING 07/10/2007    Qualifier: Diagnosis of  By: Fabian Sharp MD, Neta Mends 2008 colonoscopy [facility].     Past Surgical History  Procedure Laterality Date  . Lasik  2009    mono vision  . Dilitation & currettage/hystroscopy with versapoint resection   08/27/2012    Procedure: DILATATION & CURETTAGE/HYSTEROSCOPY WITH VERSAPOINT RESECTION;  Surgeon: Genia Del, MD;  Location: [facility];  Service: Gynecology;;    Family History  Problem Relation Age of Onset  . Colon polyps Brother     elev TG  . Alcohol abuse Other   . Breast cancer Maternal Aunt   . Diabetes Father   . Hyperlipidemia Father   . Heart disease Father   . Stroke Maternal Grandmother   . Diabetes Maternal Grandmother   . Heart disease Maternal Grandmother   . Diabetes Maternal Grandfather   . Hyperlipidemia Brother     History   Social History  . Marital Status: Married    Spouse Name: N/A  . Number of Children: N/A  . Years of Education: N/A   Occupational History  . 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 Smoking status: Never Smoker   . Smokeless tobacco: Never Used  . Alcohol Use: No  . Drug Use: No  . Sexual Activity: Not on file   Other Topics Concern  . 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 b complex vitamins capsule Take 1 capsule by mouth daily.   . cholecalciferol (VITAMIN D) 1000 UNITS tablet Take 1,000 Units by mouth daily.  . diphenhydrAMINE (BENADRYL) 25 MG tablet Take 25 mg by mouth every 6 (six) hours as needed. For post-nasal drip  . levocetirizine (XYZAL) 5 MG tablet Take 5 mg by mouth daily as needed. For post-nasal drip  . Methylsulfonylmethane (MSM) 1000 MG CAPS Take by mouth.  . Multiple Vitamin (MULTIVITAMIN WITH MINERALS) TABS Take 1 tablet by mouth daily.  . Omega-3 Fatty Acids (FISH OIL) 1200 MG CAPS Take 1,200 mg by mouth daily.  Marland Kitchen PRESCRIPTION MEDICATION Inject 1 each into the skin once a week. Allergy injections  . triamcinolone cream (KENALOG) 0.1 % Apply 1 application topically 2  (two) times daily.  Marland Kitchen VALTREX 1 G tablet Take 2 TABLETS BY MOUTH AND REPEATIN 12 HOURS OR AS DIRECTED  . citalopram (CELEXA) 10 MG tablet Take 1 tablet (10 mg total) by mouth daily. Can increase to 20 mg per day  . [DISCONTINUED] mupirocin ointment (BACTROBAN) 2 % Place 1 application into the nose 2 (two) times daily.    EXAM:  BP 110/80 mmHg  Pulse 64  Temp(Src) 98.4 F (36.9 C) (Oral)  Resp 18  Ht 5' 3.5" (1.613 m)  Wt 132 lb (59.875 kg)  BMI 23.01 kg/m2  SpO2 98%  LMP 11/28/2014  Body mass index is 23.01 kg/(m^2).  Physical Exam: Vital signs reviewed RUE:AVWU is a well-developed well-nourished alert cooperative    who appearsr stated age in no acute distress.  HEENT: normocephalic atraumatic , Eyes: PERRL EOM's full, conjunctiva clear, Nares: paten,t no deformity discharge or tenderness., Ears: no deformity EAC's clear TMs with normal landmarks. Mouth: clear OP, no lesions, edema.  Moist mucous membranes. Dentition in adequate repair. NECK: supple without masses, thyromegaly or bruits. CHEST/PULM:  Clear to auscultation and percussion breath sounds equal no wheeze , rales or rhonchi. No chest wall deformities or tenderness. CV: PMI is nondisplaced, S1 S2 no gallops, murmurs, rubs. Peripheral pulses are full without delay.No JVD .  ABDOMEN: Bowel sounds normal nontender  No guard or rebound, no hepato splenomegal no CVA tenderness.  No hernia. Extremtities:  No clubbing cyanosis or edema, no acute joint swelling or redness no focal atrophy NEURO:  Oriented x3, cranial nerves 3-12 appear to be intact, no obvious focal weakness,gait within normal limits no abnormal reflexes or asymmetrical SKIN: No acute rashes normal turgor, color, no bruising or petechiae. PSYCH: Oriented, good eye contact, no obvious depression anxiety, cognition and judgment appear normal.  Emotional when speaking of sis and family .  LN: no cervical axillary inguinal adenopathy  Lab Results  Component Value  Date   WBC 3.6* 11/29/2014   HGB 13.4 11/29/2014   HCT 38.9 11/29/2014   PLT 188.0 11/29/2014   GLUCOSE 84 11/29/2014   CHOL 197 11/29/2014   TRIG 47.0 11/29/2014   HDL 69.60 11/29/2014   LDLCALC 118* 11/29/2014   ALT 22 11/29/2014   AST 22 11/29/2014   NA 136 11/29/2014   K 3.9 11/29/2014   CL 105 11/29/2014   CREATININE 0.81 11/29/2014   BUN 15 11/29/2014   CO2 27 11/29/2014   TSH 1.76 11/29/2014    ASSESSMENT AND PLAN:  Discussed the following assessment and plan:  Need  for prophylactic vaccination with combined diphtheria-tetanus-pertussis (DTP) vaccine - Plan: Tdap vaccine greater than or equal to 7yo IM  Visit for preventive health examination - tdap get colon when 49 Disc.   options counseling medication although time to process rolls and take care of her may the best rx .  Some perimenopausal sx could contribute  rx given for low dose ssri  If wishes than rov in a month . Counseled. About other strategies  Situational   Adjustment   reactions Patient Care Team: Madelin HeadingsWanda K Kytzia Gienger, MD as PCP - General Sidney Aceanjan Sharma, MD (Allergy) Genia DelMarie-Lyne Lavoie, MD as Attending Physician (Obstetrics and Gynecology) Mardella Laymanavid R Patterson, MD as Attending Physician (Gastroenterology) Judi SaaZachary M Smith, DO as Attending Physician (Sports Medicine) Patient Instructions  Get colonoscopy at  Year of [age] .  Continue lifestyle intervention healthy eating and exercise .  Healthy lifestyle includes : At least 150 minutes of exercise weeks  , weight at healthy levels, which is usually   BMI 19-25. Avoid trans fats and processed foods;  Increase fresh fruits and veges to 5 servings per day. And avoid sweet beverages including tea and juice. Mediterranean diet with olive oil and nuts have been noted to be heart and brain healthy . bp below 140/90 .   Avoid tobacco products . Limit  alcohol to  7 per week for women and 14 servings for men.  Get adequate sleep . Wear seat belts . Don't text and drive  .  Menopause can cause interfere.   Can begin   Low dose ssri  If wish to try  If you start then  Plan ROV in 4-6 weeks.    Otherwise  yealry visit or as needed     Neta MendsWanda K. Delvin Hedeen M.D.

## 2014-12-05 NOTE — Patient Instructions (Addendum)
Get colonoscopy at  Year of 49 .  Continue lifestyle intervention healthy eating and exercise .  Healthy lifestyle includes : At least 150 minutes of exercise weeks  , weight at healthy levels, which is usually   BMI 19-25. Avoid trans fats and processed foods;  Increase fresh fruits and veges to 5 servings per day. And avoid sweet beverages including tea and juice. Mediterranean diet with olive oil and nuts have been noted to be heart and brain healthy . bp below 140/90 .   Avoid tobacco products . Limit  alcohol to  7 per week for women and 14 servings for men.  Get adequate sleep . Wear seat belts . Don't text and drive .  Menopause can cause interfere.   Can begin   Low dose ssri  If wish to try  If you start then  Plan ROV in 4-6 weeks.    Otherwise  yealry visit or as needed

## 2014-12-05 NOTE — Progress Notes (Signed)
Pre visit review using our clinic review tool, if applicable. No additional management support is needed unless otherwise documented below in the visit note. 

## 2014-12-06 ENCOUNTER — Ambulatory Visit: Payer: 59 | Admitting: Family Medicine

## 2014-12-07 ENCOUNTER — Ambulatory Visit (INDEPENDENT_AMBULATORY_CARE_PROVIDER_SITE_OTHER): Payer: 59 | Admitting: Family Medicine

## 2014-12-07 ENCOUNTER — Encounter: Payer: Self-pay | Admitting: Family Medicine

## 2014-12-07 VITALS — BP 104/80 | HR 72 | Ht 63.5 in | Wt 130.0 lb

## 2014-12-07 DIAGNOSIS — M9902 Segmental and somatic dysfunction of thoracic region: Secondary | ICD-10-CM | POA: Diagnosis not present

## 2014-12-07 DIAGNOSIS — M9901 Segmental and somatic dysfunction of cervical region: Secondary | ICD-10-CM

## 2014-12-07 DIAGNOSIS — M9903 Segmental and somatic dysfunction of lumbar region: Secondary | ICD-10-CM

## 2014-12-07 DIAGNOSIS — M5442 Lumbago with sciatica, left side: Secondary | ICD-10-CM | POA: Diagnosis not present

## 2014-12-07 DIAGNOSIS — M999 Biomechanical lesion, unspecified: Secondary | ICD-10-CM

## 2014-12-07 DIAGNOSIS — M9904 Segmental and somatic dysfunction of sacral region: Secondary | ICD-10-CM

## 2014-12-07 NOTE — Patient Instructions (Addendum)
You are doing great great overall.  Watch the yardwork stretch afterwards New exercises for the foot, nothing special more after a lot of walking.  Call me if head and allergies don't improve.  See me again in 4-6 weeks.

## 2014-12-07 NOTE — Assessment & Plan Note (Signed)
Patient's low back seems to be a little tighter today. I do think that this is secondary to patient doing more activity in a flexed position. We discussed different exercises and stretches.

## 2014-12-07 NOTE — Assessment & Plan Note (Signed)
Decision today to treat with OMT was based on Physical Exam  After verbal consent patient was treated with HVLA, ME, FPR techniques in cervical, rib, thoracic, lumbar and sacral areas more muscle energy within the cervical spine.  Patient tolerated the procedure well with improvement in symptoms  Patient given exercises, stretches and lifestyle modifications  See medications in patient instructions if given  Patient will follow up in 4-6 weeks                                     

## 2014-12-07 NOTE — Progress Notes (Signed)
Pre visit review using our clinic review tool, if applicable. No additional management support is needed unless otherwise documented below in the visit note. 

## 2014-12-07 NOTE — Progress Notes (Signed)
  Tawana ScaleZach Smith D.O. [facility] [address] [place], [place] [address] Phone: [phone]([phone] Subjective:    CC: Neck and back pain follow up  BJY:NWGNFAOZHYHPI:Subjective Jacquelynn Creeamela S Gwinn is a 49 y.o. female coming in with complaint of neck and back pain. Overall doing well, has increased exercising and feeling much better, more energy and less pain.  No new symptoms at this time, no radiation of pain. States some mild increase in mid back pain. Vision has been doing some gardening recently. Patient states that some mild increase in upper back and neck pain. Patient is also noticed some mild increase in lower back pain.    Past medical history, social, surgical and family history all reviewed in electronic medical record.   Review of Systems: No headache, visual changes, nausea, vomiting, diarrhea, constipation, dizziness, abdominal pain, skin rash, fevers, chills, night sweats, weight loss, swollen lymph nodes, body aches, joint swelling, muscle aches, chest pain, shortness of breath, mood changes.   Objective Blood pressure 104/80, pulse 72, height 5' 3.5" (1.613 m), weight 130 lb (58.968 kg), last menstrual period 11/28/2014, SpO2 98 %.  General: No apparent distress alert and oriented x3 mood and affect normal, dressed appropriately.  HEENT: Pupils equal, extraocular movements intact  Respiratory: Patient's speak in full sentences and does not appear short of breath  Cardiovascular: No lower extremity edema, non tender, no erythema  Skin: Warm dry intact with no signs of infection or rash on extremities or on axial skeleton.  Abdomen: Soft nontender  Neuro: Cranial nerves II through XII are intact, neurovascularly intact in all extremities with 2+ DTRs and 2+ pulses.  Lymph: No lymphadenopathy of posterior or anterior cervical chain or axillae bilaterally.  Gait normal with good balance and coordination.  MSK:  Non tender with full range of motion and good stability and symmetric strength and  tone of shoulders, elbows, wrist, hip, knee and ankles bilaterally.  Neck: Inspection unremarkable. No palpable stepoffs. Negative Spurling's maneuver. Full neck range of motion Grip strength and sensation normal in bilateral hands Strength good C4 to T1 distribution No sensory change to C4 to T1 Negative Hoffman sign bilaterally Reflexes normal Increased tightness of the paraspinal musculature of the cervical spine bilaterally.  Back Exam:  Inspection: Unremarkable  Motion: Flexion 45 deg improved, Extension 35 deg, Side Bending to 45 deg bilaterally,  Rotation to 45 deg bilaterally  SLR laying: Negative  XSLR laying: Negative  Palpable tenderness:some tightness over the right paraspinal musculature. FABER: negative. Sensory change: Gross sensation intact to all lumbar and sacral dermatomes.  Reflexes: 2+ at both patellar tendons, 2+ at achilles tendons, Babinski's downgoing.  Strength at foot  Plantar-flexion: 5/5 Dorsi-flexion: 5/5 Eversion: 5/5 Inversion: 5/5  Leg strength  Quad: 5/5 Hamstring: 5/5 Hip flexor: 5/5 Hip abductors: 5/5 improvement from previous exam. Gait unremarkable.  OMT Physical Exam Cervical  C2 flexed rotated and side bent left C4 flexed rotated and side bent right   Thoracic T1 extended rotated and side bent left with elevated first rib T3 extended rotated and side bent right with inhaled third rib T5 extended rotated and side bent left  Lumbar L2 flexed rotated inside that right  Sacrum Left on left  Illium Right posterior     Impression and Recommendations:     This case required medical decision making of moderate complexity.

## 2014-12-21 ENCOUNTER — Telehealth: Payer: Self-pay | Admitting: Family Medicine

## 2014-12-21 MED ORDER — DICLOFENAC SODIUM 1 % TD GEL: 2.0000 g | Freq: Four times a day (QID) | TRANSDERMAL | Status: DC

## 2014-12-21 NOTE — Telephone Encounter (Signed)
Patient was doing some yard work and now has pain in her knees. She is wondering if you would be able to prescribe her some voltaren gel. Her pharmacy is [facility].

## 2014-12-21 NOTE — Telephone Encounter (Signed)
rx sent into pharmacy

## 2015-01-19 ENCOUNTER — Encounter: Payer: Self-pay | Admitting: Family Medicine

## 2015-01-19 ENCOUNTER — Ambulatory Visit (INDEPENDENT_AMBULATORY_CARE_PROVIDER_SITE_OTHER): Payer: 59 | Admitting: Family Medicine

## 2015-01-19 VITALS — BP 108/70 | HR 67 | Ht 63.5 in | Wt 130.0 lb

## 2015-01-19 DIAGNOSIS — M9904 Segmental and somatic dysfunction of sacral region: Secondary | ICD-10-CM

## 2015-01-19 DIAGNOSIS — M999 Biomechanical lesion, unspecified: Secondary | ICD-10-CM

## 2015-01-19 DIAGNOSIS — M5442 Lumbago with sciatica, left side: Secondary | ICD-10-CM

## 2015-01-19 NOTE — Patient Instructions (Addendum)
Good to see you Lets not change a thing Bring arm rest up with sitting to take pressure of forearm and tricep.  Continue the exercises Get back in the gym regularly.  See me again 4 weeks.

## 2015-01-19 NOTE — Progress Notes (Signed)
Pre visit review using our clinic review tool, if applicable. No additional management support is needed unless otherwise documented below in the visit note. 

## 2015-01-19 NOTE — Assessment & Plan Note (Signed)
Patient is doing better overall. Seems to be more in a nice neutral position. Patient still needs to be seen on 4 week intervals for regular. Patient will continue with the over-the-counter natural supplementation. If any worsening symptoms we'll consider changing the protocol but at this point we will continue with the conservative therapy.

## 2015-01-19 NOTE — Assessment & Plan Note (Signed)
Decision today to treat with OMT was based on Physical Exam  After verbal consent patient was treated with HVLA, ME, FPR techniques in cervical, rib, thoracic, lumbar and sacral areas more muscle energy within the cervical spine.  Patient tolerated the procedure well with improvement in symptoms  Patient given exercises, stretches and lifestyle modifications  See medications in patient instructions if given  Patient will follow up in 4-6 weeks                                     

## 2015-01-19 NOTE — Progress Notes (Signed)
  Tawana ScaleZach Rajat Staver D.O. [facility] [address] [place], [place] [address] Phone: [phone] Subjective:    CC: Neck and back pain follow up  ION:[id number]:Subjective Valerie [name] is a 49 y.o. female coming in with complaint of neck and back pain. Overall doing well, has increased exercising and feeling much better, more energy and less pain.  No new symptoms at this time, no radiation of pain. States some mild increase in mid back pain. Patient did travel to [place] recently. Patient overall continues have some mild soreness of her neck. Patient states when traveling and did feel better but now that she is starting to work regularly and having more discomfort.     Past medical history, social, surgical and family history all reviewed in electronic medical record.   Review of Systems: No headache, visual changes, nausea, vomiting, diarrhea, constipation, dizziness, abdominal pain, skin rash, fevers, chills, night sweats, weight loss, swollen lymph nodes, body aches, joint swelling, muscle aches, chest pain, shortness of breath, mood changes.   Objective Blood pressure 108/70, pulse 67, height 5' 3.5" (1.613 m), weight 130 lb (58.968 kg), SpO2 98 %.  General: No apparent distress alert and oriented x3 mood and affect normal, dressed appropriately.  HEENT: Pupils equal, extraocular movements intact  Respiratory: Patient's speak in full sentences and does not appear short of breath  Cardiovascular: No lower extremity edema, non tender, no erythema  Skin: Warm dry intact with no signs of infection or rash on extremities or on axial skeleton.  Abdomen: Soft nontender  Neuro: Cranial nerves II through XII are intact, neurovascularly intact in all extremities with 2+ DTRs and 2+ pulses.  Lymph: No lymphadenopathy of posterior or anterior cervical chain or axillae bilaterally.  Gait normal with good balance and coordination.  MSK:  Non tender with full range of motion and good stability and  symmetric strength and tone of shoulders, elbows, wrist, hip, knee and ankles bilaterally.  Neck: Inspection unremarkable. No palpable stepoffs. Negative Spurling's maneuver. Full neck range of motion Grip strength and sensation normal in bilateral hands Strength good C4 to T1 distribution No sensory change to C4 to T1 Negative Hoffman sign bilaterally Reflexes normal Increased tightness of the paraspinal musculature of the cervical spine bilaterally.  Back Exam:  Inspection: Unremarkable  Motion: Flexion 45 deg improved, Extension 35 deg, Side Bending to 45 deg bilaterally,  Rotation to 45 deg bilaterally  SLR laying: Negative  XSLR laying: Negative  Palpable tenderness still noted FABER: negative. Sensory change: Gross sensation intact to all lumbar and sacral dermatomes.  Reflexes: 2+ at both patellar tendons, 2+ at achilles tendons, Babinski's downgoing.  Strength at foot  Plantar-flexion: 5/5 Dorsi-flexion: 5/5 Eversion: 5/5 Inversion: 5/5  Leg strength  Quad: 5/5 Hamstring: 5/5 Hip flexor: 5/5 Hip abductors: 4/5 improvement from previous exam. Gait unremarkable.  OMT Physical Exam Cervical  C2 flexed rotated and side bent left C4 flexed rotated and side bent right   Thoracic T1 extended rotated and side bent left with elevated first rib T3 extended rotated and side bent right  T5 extended rotated and side bent left  Lumbar L2 flexed rotated inside that right  Sacrum Left on left  Illium Right posterior     Impression and Recommendations:     This case required medical decision making of moderate complexity.

## 2015-01-23 ENCOUNTER — Ambulatory Visit (INDEPENDENT_AMBULATORY_CARE_PROVIDER_SITE_OTHER): Payer: 59 | Admitting: Adult Health

## 2015-01-23 ENCOUNTER — Encounter: Payer: Self-pay | Admitting: Adult Health

## 2015-01-23 VITALS — BP 118/70 | Temp 98.6°F | Ht 63.5 in | Wt 128.6 lb

## 2015-01-23 DIAGNOSIS — L237 Allergic contact dermatitis due to plants, except food: Secondary | ICD-10-CM

## 2015-01-23 MED ORDER — METHYLPREDNISOLONE ACETATE 80 MG/ML IJ SUSP
80.0000 mg | Freq: Once | INTRAMUSCULAR | Status: AC
  Administered 2015-01-23: 80 mg via INTRAMUSCULAR

## 2015-01-23 MED ORDER — HYDROXYZINE HCL 25 MG PO TABS: 25.0000 mg | ORAL_TABLET | Freq: Three times a day (TID) | ORAL | Status: DC | PRN

## 2015-01-23 MED ORDER — PREDNISONE 20 MG PO TABS: 20.0000 mg | ORAL_TABLET | Freq: Every day | ORAL | Status: DC

## 2015-01-23 NOTE — Progress Notes (Signed)
Pre visit review using our clinic review tool, if applicable. No additional management support is needed unless otherwise documented below in the visit note. 

## 2015-01-23 NOTE — Patient Instructions (Addendum)
I have sent the prescriptions to the pharmacy for the Atarax and Prednisone. Please take the prednisone as prescribed:   Day 1 40 mg Day 2 40 mg Day 3 30 mg Day 4 30 mg Day 5 20 mg Day 6 10 mg Day 7 10 mg  You can also try oatmeal baths and/or calamine lotion to help with the itching.   Follow up if no improvement in 2-3 days or if symptoms get worse.  Poison [employer] Poison ivy is a inflammation of the skin (contact dermatitis) caused by touching the allergens on the leaves of the ivy plant following previous exposure to the plant. The rash usually appears 48 hours after exposure. The rash is usually bumps (papules) or blisters (vesicles) in a linear pattern. Depending on your own sensitivity, the rash may simply cause redness and itching, or it may also progress to blisters which may break open. These must be well cared for to prevent secondary bacterial (germ) infection, followed by scarring. Keep any open areas dry, clean, dressed, and covered with an antibacterial ointment if needed. The eyes may also get puffy. The puffiness is worst in the morning and gets better as the day progresses. This dermatitis usually heals without scarring, within 2 to 3 weeks without treatment. HOME CARE INSTRUCTIONS  Thoroughly wash with soap and water as soon as you have been exposed to poison ivy. You have about one half hour to remove the plant resin before it will cause the rash. This washing will destroy the oil or antigen on the skin that is causing, or will cause, the rash. Be sure to wash under your fingernails as any plant resin there will continue to spread the rash. Do not rub skin vigorously when washing affected area. Poison ivy cannot spread if no oil from the plant remains on your body. A rash that has progressed to weeping sores will not spread the rash unless you have not washed thoroughly. It is also important to wash any clothes you have been wearing as these may carry active allergens. The rash will  return if you wear the unwashed clothing, even several days later. Avoidance of the plant in the future is the best measure. Poison ivy plant can be recognized by the number of leaves. Generally, poison ivy has three leaves with flowering branches on a single stem. Diphenhydramine may be purchased over the counter and used as needed for itching. Do not drive with this medication if it makes you drowsy.Ask your caregiver about medication for children. SEEK MEDICAL CARE IF:  Open sores develop.  Redness spreads beyond area of rash.  You notice purulent (pus-like) discharge.  You have increased pain.  Other signs of infection develop (such as fever). Document Released: 08/02/2000 Document Revised: 10/28/2011 Document Reviewed: 01/13/2009 [facility] Patient Information 2015 [employer] [place]. This information is not intended to replace advice given to you by your health care provider. Make sure you discuss any questions you have with your health care provider.

## 2015-01-23 NOTE — Progress Notes (Signed)
   Subjective:    Patient ID: Valerie [name], female    DOB: 04-16-1966, 49 y.o.   MRN: [id number]  HPI  Patient presents to the office for possible poison ivy infection on arms and face. She first noticed itching and burning on Saturday after working in the yard. Starting to have blisters yesterday. It is mostly contained to her bilateral forearms. She does endorse itching on the right side of the face under her bottom lip. Has tried Benadrly and Aloe with minimal relief.   Denies fever, any signs or symptoms of infection  Review of Systems  HENT: Negative for facial swelling.   Skin: Positive for wound.  All other systems reviewed and are negative.      Objective:   Physical Exam  Constitutional: She is oriented to person, place, and time. She appears well-developed and well-nourished.  Neurological: She is alert and oriented to person, place, and time.  Skin: Skin is warm and dry. Rash noted. She is not diaphoretic. There is erythema.  Vesicular type clustered blisters on bilateral forearms R>L.   No blisters noted on face.   Psychiatric: She has a normal mood and affect. Her behavior is normal. Judgment and thought content normal.  Nursing note and vitals reviewed.       Assessment & Plan:  1. Poison ivy dermatitis - hydrOXYzine (ATARAX/VISTARIL) 25 MG tablet; Take 1 tablet (25 mg total) by mouth every 8 (eight) hours as needed.  Dispense: 30 tablet; Refill: 0 - predniSONE (DELTASONE) 20 MG tablet; Take 1 tablet (20 mg total) by mouth daily with breakfast.  Dispense: 8 tablet; Refill: 0 - Follow up in 2-3 days if no improvement or sooner if symptoms worsen or infection is noticed.  - Can try OTC oatmeal baths or calamine lotion for symptom relief.

## 2015-02-15 ENCOUNTER — Ambulatory Visit (INDEPENDENT_AMBULATORY_CARE_PROVIDER_SITE_OTHER): Payer: 59 | Admitting: Family Medicine

## 2015-02-15 ENCOUNTER — Encounter: Payer: Self-pay | Admitting: Family Medicine

## 2015-02-15 VITALS — BP 104/72 | HR 79 | Ht 63.5 in | Wt 129.0 lb

## 2015-02-15 DIAGNOSIS — M542 Cervicalgia: Secondary | ICD-10-CM

## 2015-02-15 DIAGNOSIS — M9904 Segmental and somatic dysfunction of sacral region: Secondary | ICD-10-CM | POA: Diagnosis not present

## 2015-02-15 DIAGNOSIS — M999 Biomechanical lesion, unspecified: Secondary | ICD-10-CM

## 2015-02-15 DIAGNOSIS — M9901 Segmental and somatic dysfunction of cervical region: Secondary | ICD-10-CM | POA: Diagnosis not present

## 2015-02-15 DIAGNOSIS — M9903 Segmental and somatic dysfunction of lumbar region: Secondary | ICD-10-CM

## 2015-02-15 DIAGNOSIS — M9902 Segmental and somatic dysfunction of thoracic region: Secondary | ICD-10-CM

## 2015-02-15 MED ORDER — METHOCARBAMOL 500 MG PO TABS: 500.0000 mg | ORAL_TABLET | Freq: Three times a day (TID) | ORAL | Status: DC

## 2015-02-15 NOTE — Assessment & Plan Note (Signed)
Patient had more of a spasm noted today of the trapezius. I think this is from the secondary to the elevated rib. Patient responded well to manipulation but there is concern for possible spasm. Patient was given a muscle relaxer to have on hand. I do not think that this will be likely needed. We discussed icing regimen and home exercises. We discussed continuing everything else she is doing at this time and patient will come back and see me again in 3-4 weeks for further evaluation and treatment. The patient is doing well she is welcome to space out the interval to 6-8 weeks.

## 2015-02-15 NOTE — Progress Notes (Signed)
  Tawana ScaleZach Nicolas Banh D.O. [facility] [address] [place], [place] [address] Phone: [phone] Subjective:    CC: Neck and back pain follow up  BJY:[id number]:Subjective Valerie Creeamela S [name] is a 49 y.o. female coming in with complaint of neck and back pain. Overall doing well, has increased exercising and feeling much better, more energy and less pain.  She does notice that she's had some more upper right sided back pain. Patient has been doing more exercises as well as working in the yard. Patient states that his been more aggressive than usual. Denies any radiation or any numbness.    Past medical history, social, surgical and family history all reviewed in electronic medical record.   Review of Systems: No headache, visual changes, nausea, vomiting, diarrhea, constipation, dizziness, abdominal pain, skin rash, fevers, chills, night sweats, weight loss, swollen lymph nodes, body aches, joint swelling, muscle aches, chest pain, shortness of breath, mood changes.   Objective Blood pressure 104/72, pulse 79, height 5' 3.5" (1.613 m), weight 129 lb (58.514 kg), SpO2 97 %.  General: No apparent distress alert and oriented x3 mood and affect normal, dressed appropriately.  HEENT: Pupils equal, extraocular movements intact  Respiratory: Patient's speak in full sentences and does not appear short of breath  Cardiovascular: No lower extremity edema, non tender, no erythema  Skin: Warm dry intact with no signs of infection or rash on extremities or on axial skeleton.  Abdomen: Soft nontender  Neuro: Cranial nerves II through XII are intact, neurovascularly intact in all extremities with 2+ DTRs and 2+ pulses.  Lymph: No lymphadenopathy of posterior or anterior cervical chain or axillae bilaterally.  Gait normal with good balance and coordination.  MSK:  Non tender with full range of motion and good stability and symmetric strength and tone of shoulders, elbows, wrist, hip, knee and ankles bilaterally.    Neck: Inspection unremarkable. No palpable stepoffs. Negative Spurling's maneuver. Full neck range of motion Grip strength and sensation normal in bilateral hands Strength good C4 to T1 distribution No sensory change to C4 to T1 Negative Hoffman sign bilaterally Reflexes normal Increased tightness of the paraspinal musculature of the cervical spine bilaterally.  Back Exam:  Inspection: Unremarkable  Motion: Flexion 45 deg improved, Extension 35 deg, Side Bending to 35 deg bilaterally,  Rotation to 45 deg bilaterally  SLR laying: Negative  XSLR laying: Negative  Palpable or tenderness over the paraspinal musculature on the right side thoracic spine FABER: negative. Sensory change: Gross sensation intact to all lumbar and sacral dermatomes.  Reflexes: 2+ at both patellar tendons, 2+ at achilles tendons, Babinski's downgoing.  Strength at foot  Plantar-flexion: 5/5 Dorsi-flexion: 5/5 Eversion: 5/5 Inversion: 5/5  Leg strength  Quad: 5/5 Hamstring: 5/5 Hip flexor: 5/5 Hip abductors: 4/5 improvement from previous exam. Gait unremarkable.  OMT Physical Exam Cervical  C2 flexed rotated and side bent left C4 flexed rotated and side bent right   Thoracic T1 extended rotated and side bent left with elevated first rib T3 extended rotated and side bent right inhaled right rib T5 extended rotated and side bent left  Lumbar L2 flexed rotated inside that right  Sacrum Left on left  Illium neutral     Impression and Recommendations:     This case required medical decision making of moderate complexity.

## 2015-02-15 NOTE — Patient Instructions (Signed)
Good to see you No more yard work ;) Ice probably when get home.  Robaxin if you need it.  Conitnue what you are doing Consider making an appointn in 3 weeks if perfect move it to 6

## 2015-02-15 NOTE — Assessment & Plan Note (Signed)
Decision today to treat with OMT was based on Physical Exam  After verbal consent patient was treated with HVLA, ME, FPR techniques in cervical, rib, thoracic, lumbar and sacral areas more muscle energy within the cervical spine.  Patient tolerated the procedure well with improvement in symptoms  Patient given exercises, stretches and lifestyle modifications  See medications in patient instructions if given  Patient will follow up in 6-8 weeks

## 2015-02-15 NOTE — Progress Notes (Signed)
Pre visit review using our clinic review tool, if applicable. No additional management support is needed unless otherwise documented below in the visit note. 

## 2015-03-08 ENCOUNTER — Encounter: Payer: Self-pay | Admitting: Family Medicine

## 2015-03-08 ENCOUNTER — Ambulatory Visit (INDEPENDENT_AMBULATORY_CARE_PROVIDER_SITE_OTHER): Payer: 59 | Admitting: Family Medicine

## 2015-03-08 VITALS — BP 116/76 | HR 66 | Wt 129.0 lb

## 2015-03-08 DIAGNOSIS — M9901 Segmental and somatic dysfunction of cervical region: Secondary | ICD-10-CM

## 2015-03-08 DIAGNOSIS — M25511 Pain in right shoulder: Secondary | ICD-10-CM

## 2015-03-08 DIAGNOSIS — M9903 Segmental and somatic dysfunction of lumbar region: Secondary | ICD-10-CM

## 2015-03-08 DIAGNOSIS — M7541 Impingement syndrome of right shoulder: Secondary | ICD-10-CM | POA: Diagnosis not present

## 2015-03-08 DIAGNOSIS — M9904 Segmental and somatic dysfunction of sacral region: Secondary | ICD-10-CM

## 2015-03-08 DIAGNOSIS — M542 Cervicalgia: Secondary | ICD-10-CM | POA: Diagnosis not present

## 2015-03-08 DIAGNOSIS — M9902 Segmental and somatic dysfunction of thoracic region: Secondary | ICD-10-CM

## 2015-03-08 DIAGNOSIS — M999 Biomechanical lesion, unspecified: Secondary | ICD-10-CM

## 2015-03-08 NOTE — Patient Instructions (Addendum)
Good to see you New exercises for the shoulder Tried the trigger point today Continue the other exercises in 72 hours See me again in 3 weeks and if pain worsens do the duexis for 3 days

## 2015-03-08 NOTE — Assessment & Plan Note (Signed)
I believe the patient has more shoulder impingement. Given home exercises, discussed icing, discussed topical anti-inflammatory is a possibility. Patient does have a muscle relaxer. If no significant improvement in 3 weeks we'll consider injection.

## 2015-03-08 NOTE — Assessment & Plan Note (Signed)
Negative for muscle spasm. Discussed with patient is a muscle relaxer on a regular basis. We discussed the possibility of oral anti-inflammatory the patient has had difficulty with her stomach previously. Patient was given trigger point injections in the right trapezius. Marga HootsOakley that this will be beneficial. Did respond to manipulation. Patient follow-up in 2-3 weeks for further evaluation. We'll call if any worsening symptoms.

## 2015-03-08 NOTE — Assessment & Plan Note (Signed)
Decision today to treat with OMT was based on Physical Exam  After verbal consent patient was treated with HVLA, ME, FPR techniques in cervical, rib, thoracic, lumbar and sacral areas more muscle energy within the cervical spine.  Patient tolerated the procedure well with improvement in symptoms  Patient given exercises, stretches and lifestyle modifications  See medications in patient instructions if given  Patient will follow up in 3 weeks

## 2015-03-08 NOTE — Progress Notes (Signed)
Tawana ScaleZach Aveleen Nevers D.O. [facility] [address] [place], [place] [address] Phone: [phone] Subjective:    CC: Neck and back pain follow up  ZHY:[id number]:Subjective Jacquelynn Creeamela S Sussman is a 49 y.o. female coming in with complaint of neck and back pain. She was seen 3 weeks ago for more of an exacerbation of her back pain. Patient was given muscle relaxer and different home exercises. Patient states back pain seems to be somewhat better but continues to have the bilateral shoulder pain. States that the right side seems to be worse. States that certain activities can be difficult as well.patient states that if she tries to get her jacket on it seems to be difficulty and even to light weight seems to be somewhat difficult. Denies though weakness.    Past medical history, social, surgical and family history all reviewed in electronic medical record.   Review of Systems: No headache, visual changes, nausea, vomiting, diarrhea, constipation, dizziness, abdominal pain, skin rash, fevers, chills, night sweats, weight loss, swollen lymph nodes, body aches, joint swelling, muscle aches, chest pain, shortness of breath, mood changes.   Objective Blood pressure 116/76, pulse 66, weight 129 lb (58.514 kg), SpO2 98 %.  General: No apparent distress alert and oriented x3 mood and affect normal, dressed appropriately.  HEENT: Pupils equal, extraocular movements intact  Respiratory: Patient's speak in full sentences and does not appear short of breath  Cardiovascular: No lower extremity edema, non tender, no erythema  Skin: Warm dry intact with no signs of infection or rash on extremities or on axial skeleton.  Abdomen: Soft nontender  Neuro: Cranial nerves II through XII are intact, neurovascularly intact in all extremities with 2+ DTRs and 2+ pulses.  Lymph: No lymphadenopathy of posterior or anterior cervical chain or axillae bilaterally.  Gait normal with good balance and coordination.  MSK:  Non tender with  full range of motion and good stability and symmetric strength and tone of , elbows, wrist, hip, knee and ankles bilaterally.  Shoulder:right Inspection reveals no abnormalities, atrophy or asymmetry. Palpation is normal with no tenderness over AC joint or bicipital groove. ROM is full in all planes. Rotator cuff strength normal throughout. Positive impingement signs with Neer's and Hawkins Speeds and Yergason's tests normal. No labral pathology noted with negative Obrien's, negative clunk and good stability. Normal scapular function observed. No painful arc and no drop arm sign. No apprehension sign    Neck: Inspection unremarkable. No palpable stepoffs. Negative Spurling's maneuver. Full neck range of motion Grip strength and sensation normal in bilateral hands Strength good C4 to T1 distribution No sensory change to C4 to T1 Negative Hoffman sign bilaterally Reflexes normal Increased tightness of the paraspinal musculature of the cervical spine bilaterally.muscle spasm on the right trapezius  Back Exam:  Inspection: Unremarkable  Motion: Flexion 45 deg improved, Extension 35 deg, Side Bending to 35 deg bilaterally,  Rotation to 45 deg bilaterally  SLR laying: Negative  XSLR laying: Negative  Palpable or tenderness over the paraspinal musculature on the right side thoracic spine patient also has a muscle spasm with trigger points in the right trapezius muscle FABER: negative. Sensory change: Gross sensation intact to all lumbar and sacral dermatomes.  Reflexes: 2+ at both patellar tendons, 2+ at achilles tendons, Babinski's downgoing.  Strength at foot  Plantar-flexion: 5/5 Dorsi-flexion: 5/5 Eversion: 5/5 Inversion: 5/5  Leg strength  Quad: 5/5 Hamstring: 5/5 Hip flexor: 5/5 Hip abductors: 4/5 improvement from previous exam. Gait unremarkable.  OMT Physical Exam  Cervical  C2 flexed rotated and side bent left C4 flexed rotated and side bent right   Thoracic T1  extended rotated and side bent left with elevated first ribsignificant muscle spasm in the trapezius T3 extended rotated and side bent right inhaled right rib T5 extended rotated and side bent left  Lumbar L2 flexed rotated inside that right  Sacrum Left on left  Illium neutral   After verbal consent patient was prepped with alcohol swabs and with a 25-gauge 1 inch needle patient was injected in 4 trigger point in the right trapezius with a total of 2 mL of 0.5% Marcaine and 1 mL of Kenalog 40 mg/dL.    Impression and Recommendations:     This case required medical decision making of moderate complexity.

## 2015-03-08 NOTE — Assessment & Plan Note (Signed)
Patient given injections today and tolerated the procedure very well. Post injection instructions given.

## 2015-03-30 ENCOUNTER — Encounter: Payer: Self-pay | Admitting: Family Medicine

## 2015-03-30 ENCOUNTER — Ambulatory Visit (INDEPENDENT_AMBULATORY_CARE_PROVIDER_SITE_OTHER): Payer: 59 | Admitting: Family Medicine

## 2015-03-30 VITALS — BP 120/70 | HR 63 | Ht 63.5 in | Wt 128.0 lb

## 2015-03-30 DIAGNOSIS — M9903 Segmental and somatic dysfunction of lumbar region: Secondary | ICD-10-CM | POA: Diagnosis not present

## 2015-03-30 DIAGNOSIS — M9901 Segmental and somatic dysfunction of cervical region: Secondary | ICD-10-CM | POA: Diagnosis not present

## 2015-03-30 DIAGNOSIS — M542 Cervicalgia: Secondary | ICD-10-CM

## 2015-03-30 DIAGNOSIS — M999 Biomechanical lesion, unspecified: Secondary | ICD-10-CM

## 2015-03-30 DIAGNOSIS — M9904 Segmental and somatic dysfunction of sacral region: Secondary | ICD-10-CM | POA: Diagnosis not present

## 2015-03-30 NOTE — Assessment & Plan Note (Signed)
Patient is having significant muscle tightness and does not seem responding very well. Patient given a course of anti-inflammatory's and will take a muscle relaxer on a more regular basis. Patient continues to have difficulty I do think imaging is warranted. We'll make sure that no cervical radiculopathy complaint a role the patient does have a negative Spurling's today. We discussed icing and avoiding significant activity overhead when possible. Patient will come back and see me again in 3 weeks for further evaluation.

## 2015-03-30 NOTE — Patient Instructions (Addendum)
Good to see you I am sorry you are hurting.  Hydroxyzine  during the day Muscle relaxer at night Ice can help Try the exercises  A little more regularly.  Shoulder is still not reading for weeding.  See me again 3 weeks.

## 2015-03-30 NOTE — Assessment & Plan Note (Signed)
Decision today to treat with OMT was based on Physical Exam  After verbal consent patient was treated with HVLA, ME, FPR techniques in cervical, rib, thoracic, lumbar and sacral areas more muscle energy within the cervical spine.  Patient tolerated the procedure well with improvement in symptoms  Patient given exercises, stretches and lifestyle modifications  See medications in patient instructions if given  Patient will follow up in 3 weeks                             

## 2015-03-30 NOTE — Progress Notes (Signed)
Valerie [name] [address] [place], [place] [address] Phone: [phone] Subjective:    CC: Neck and back pain follow up  Valerie [name] is a 49 y.o. female coming in with complaint of neck and back pain.  Patient has had some muscle spasms of the right trapezius resolving. Last time patient was seen we did do trigger point injections. States it does seem to help but unfortunately continues to give her difficulty. Patient continues to have pain mostly on the right side shoulder. States that she feels like she is compensating somewhat. States that it is starting to affect some of her mood. Patient states he can be very uncomfortable at night as well.  Past medical history, social, surgical and family history all reviewed in electronic medical record.   Review of Systems: No headache, visual changes, nausea, vomiting, diarrhea, constipation, dizziness, abdominal pain, skin rash, fevers, chills, night sweats, weight loss, swollen lymph nodes, body aches, joint swelling, muscle aches, chest pain, shortness of breath, mood changes.   Objective Blood pressure 120/70, pulse 63, height 5' 3.5" (1.613 m), weight 128 lb (58.06 kg), SpO2 98 %.  General: No apparent distress alert and oriented x3 mood and affect normal, dressed appropriately.  HEENT: Pupils equal, extraocular movements intact  Respiratory: Patient's speak in full sentences and does not appear short of breath  Cardiovascular: No lower extremity edema, non tender, no erythema  Skin: Warm dry intact with no signs of infection or rash on extremities or on axial skeleton.  Abdomen: Soft nontender  Neuro: Cranial nerves II through XII are intact, neurovascularly intact in all extremities with 2+ DTRs and 2+ pulses.  Lymph: No lymphadenopathy of posterior or anterior cervical chain or axillae bilaterally.  Gait normal with good balance and coordination.  MSK:  Non tender with full range of motion and  good stability and symmetric strength and tone of , elbows, wrist, hip, knee and ankles bilaterally.  Shoulder:right Inspection reveals no abnormalities, atrophy or asymmetry. Palpation is normal with no tenderness over AC joint or bicipital groove. ROM is full in all planes. Rotator cuff strength normal throughout. Positive impingement signs with Neer's and Hawkinsstill present Speeds and Yergason's tests normal. No labral pathology noted with negative Obrien's, negative clunk and good stability. Normal scapular function observed. No painful arc and no drop arm sign. No apprehension sign    Neck: Inspection unremarkable. No palpable stepoffs. Negative Spurling's maneuver. Full neck range of motion Grip strength and sensation normal in bilateral hands Strength good C4 to T1 distribution No sensory change to C4 to T1 Negative Hoffman sign bilaterally Reflexes normal Increased tightness of the paraspinal musculature of the cervical spine bilaterally.muscle spasm on the right trapeziuspossibly even more than last time.  Back Exam:  Inspection: Unremarkable  Motion: Flexion 45 deg improved, Extension 35 deg, Side Bending to 35 deg bilaterally,  Rotation to 45 deg bilaterally  SLR laying: Negative  XSLR laying: Negative  Tightness of the trapezius noted FABER: negative. Sensory change: Gross sensation intact to all lumbar and sacral dermatomes.  Reflexes: 2+ at both patellar tendons, 2+ at achilles tendons, Babinski's downgoing.  Strength at foot  Plantar-flexion: 5/5 Dorsi-flexion: 5/5 Eversion: 5/5 Inversion: 5/5  Leg strength  Quad: 5/5 Hamstring: 5/5 Hip flexor: 5/5 Hip abductors: 4/5  Gait unremarkable.  OMT Physical Exam Cervical  C2 flexed rotated and side bent left C4 flexed rotated and side bent right   Thoracic T1 extended rotated and side bent  left with elevated first ribsignificant muscle spasm in the trapezius T3 extended rotated and side bent right inhaled  right rib T5 extended rotated and side bent left  Lumbar L2 flexed rotated inside that right  Sacrum Left on left  Illium neutral      Impression and Recommendations:     This case required medical decision making of moderate complexity.

## 2015-03-30 NOTE — Progress Notes (Signed)
Pre visit review using our clinic review tool, if applicable. No additional management support is needed unless otherwise documented below in the visit note. 

## 2015-04-10 ENCOUNTER — Ambulatory Visit (INDEPENDENT_AMBULATORY_CARE_PROVIDER_SITE_OTHER): Payer: 59 | Admitting: Internal Medicine

## 2015-04-10 ENCOUNTER — Telehealth: Payer: Self-pay | Admitting: Internal Medicine

## 2015-04-10 ENCOUNTER — Encounter: Payer: Self-pay | Admitting: Internal Medicine

## 2015-04-10 VITALS — BP 120/84 | HR 68 | Temp 98.7°F | Wt 128.0 lb

## 2015-04-10 DIAGNOSIS — R079 Chest pain, unspecified: Secondary | ICD-10-CM | POA: Diagnosis not present

## 2015-04-10 LAB — TROPONIN I: TNIDX: 0 ug/l (ref 0.00–0.06)

## 2015-04-10 NOTE — Telephone Encounter (Signed)
Patient Name: Valerie [name] DOB: October 15, 1965 Initial Comment Caller states having chest pain this morning Nurse Assessment Nurse: Charna Elizabeth, RN, Cathy Date/Time (Eastern Time): 04/10/2015 9:49:36 AM Confirm and document reason for call. If symptomatic, describe symptoms. ---Caller states she developed chest pain this morning that has now gone away. No breathing difficulty. No injured in the past 3 days. No fever. Has the patient traveled out of the country within the last 30 days? ---No Does the patient require triage? ---Yes Related visit to physician within the last 2 weeks? ---No Does the PT have any chronic conditions? (i.e. diabetes, asthma, etc.) ---Yes List chronic conditions. ---High Cholesterol, Allergies, Neck pain Did the patient indicate they were pregnant? ---No Guidelines Guideline Title Affirmed Question Affirmed Notes Chest Pain Pain also present in shoulder(s) or arm(s) or jaw (Exception: pain is clearly made worse by movement) jaw Final Disposition User Go to ED Now Charna Elizabeth, RN, Cathy Referrals [facility] - ED Disagree/Comply: Comply

## 2015-04-10 NOTE — Telephone Encounter (Signed)
Appointment scheduled today at 1:30

## 2015-04-10 NOTE — Patient Instructions (Signed)
ekg is normal  Will send toponin x 1 although  Usually repeat over time .   May be chest wall or other realted to MS  Problems  However if  persistent or progressive consider other evaluation  . Cardiology . Stress test etc.    Chest Pain (Nonspecific) It is often hard to give a specific diagnosis for the cause of chest pain. There is always a chance that your pain could be related to something serious, such as a heart attack or a blood clot in the lungs. You need to follow up with your health care provider for further evaluation. CAUSES   Heartburn.  Pneumonia or bronchitis.  Anxiety or stress.  Inflammation around your heart (pericarditis) or lung (pleuritis or pleurisy).  A blood clot in the lung.  A collapsed lung (pneumothorax). It can develop suddenly on its own (spontaneous pneumothorax) or from trauma to the chest.  Shingles infection (herpes zoster virus). The chest wall is composed of bones, muscles, and cartilage. Any of these can be the source of the pain.  The bones can be bruised by injury.  The muscles or cartilage can be strained by coughing or overwork.  The cartilage can be affected by inflammation and become sore (costochondritis). DIAGNOSIS  Lab tests or other studies may be needed to find the cause of your pain. Your health care provider may have you take a test called an ambulatory electrocardiogram (ECG). An ECG records your heartbeat patterns over a 24-hour period. You may also have other tests, such as:  Transthoracic echocardiogram (TTE). During echocardiography, sound waves are used to evaluate how blood flows through your heart.  Transesophageal echocardiogram (TEE).  Cardiac monitoring. This allows your health care provider to monitor your heart rate and rhythm in real time.  Holter monitor. This is a portable device that records your heartbeat and can help diagnose heart arrhythmias. It allows your health care provider to track your heart activity  for several days, if needed.  Stress tests by exercise or by giving medicine that makes the heart beat faster. TREATMENT   Treatment depends on what may be causing your chest pain. Treatment may include:  Acid blockers for heartburn.  Anti-inflammatory medicine.  Pain medicine for inflammatory conditions.  Antibiotics if an infection is present.  You may be advised to change lifestyle habits. This includes stopping smoking and avoiding alcohol, caffeine, and chocolate.  You may be advised to keep your head raised (elevated) when sleeping. This reduces the chance of acid going backward from your stomach into your esophagus. Most of the time, nonspecific chest pain will improve within 2-3 days with rest and mild pain medicine.  HOME CARE INSTRUCTIONS   If antibiotics were prescribed, take them as directed. Finish them even if you start to feel better.  For the next few days, avoid physical activities that bring on chest pain. Continue physical activities as directed.  Do not use any tobacco products, including cigarettes, chewing tobacco, or electronic cigarettes.  Avoid drinking alcohol.  Only take medicine as directed by your health care provider.  Follow your health care provider's suggestions for further testing if your chest pain does not go away.  Keep any follow-up appointments you made. If you do not go to an appointment, you could develop lasting (chronic) problems with pain. If there is any problem keeping an appointment, call to reschedule. SEEK MEDICAL CARE IF:   Your chest pain does not go away, even after treatment.  You have a rash  with blisters on your chest.  You have a fever. SEEK IMMEDIATE MEDICAL CARE IF:   You have increased chest pain or pain that spreads to your arm, neck, jaw, back, or abdomen.  You have shortness of breath.  You have an increasing cough, or you cough up blood.  You have severe back or abdominal pain.  You feel nauseous or  vomit.  You have severe weakness.  You faint.  You have chills. This is an emergency. Do not wait to see if the pain will go away. Get medical help at once. Call your local emergency services (911 in [place] Do not drive yourself to the hospital. MAKE SURE YOU:   Understand these instructions.  Will watch your condition.  Will get help right away if you are not doing well or get worse. Document Released: 05/15/2005 Document Revised: 08/10/2013 Document Reviewed: 03/10/2008 [facility] Patient Information 2015 [place], [place]. This information is not intended to replace advice given to you by your health care provider. Make sure you discuss any questions you have with your health care provider.

## 2015-04-10 NOTE — Progress Notes (Signed)
Pre visit review using our clinic review tool, if applicable. No additional management support is needed unless otherwise documented below in the visit note.   Chief Complaint  Patient presents with  . Chest Pain    HPI: Patient Valerie [name]  comes in today for SDA for  new problem evaluation.  Se triage note  Under care for ms pain neck shoulder  And has some baseline gerd sx . Today of warm chest pain achy throbbing  feeling left upper chest that was new   No assoc sx went away and then came back for less time  minutes  .    Was concerned so called  Triage advised ed by protocol but  In house triage had her come in.   No sob syncope sweating  Nausea . No meds new  No dysphagia .   no hx of heart disease    ROS: See pertinent positives and negatives per HPI. Hx tinglingright face / if left jaw  Has tmj laxity  Issues .  No exercise intolerance   Past Medical History  Diagnosis Date  . Anemia     NOS  . GERD (gastroesophageal reflux disease)   . HSV infection     labialis  . 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 Allergy     cats  . RECTAL BLEEDING 07/10/2007    Qualifier: Diagnosis of  By: Fabian Sharp MD, Neta Mends 2008 colonoscopy [facility].     Family History  Problem Relation Age of Onset  . Colon polyps Brother     elev TG  . Alcohol abuse Other   . Breast cancer Maternal Aunt   . Diabetes Father   . Hyperlipidemia Father   . Heart disease Father   . Stroke Maternal Grandmother   . Diabetes Maternal Grandmother   . Heart disease Maternal Grandmother   . Diabetes Maternal Grandfather   . Hyperlipidemia Brother     Social History   Social History  . Marital Status: Married    Spouse Name: N/A  . Number of Children: N/A  . Years of Education: N/A   Occupational History  . PA [employer]    [employer]   Social History Main Topics  . Smoking status: Never Smoker   . Smokeless tobacco: Never Used  . Alcohol Use: No  . Drug Use: No  . Sexual Activity: Not  on file   Other Topics Concern  . 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 b complex vitamins capsule Take 1 capsule by mouth daily.     . cholecalciferol (VITAMIN D) 1000 UNITS tablet Take 1,000 Units by mouth daily.    . diclofenac sodium (VOLTAREN) 1 % GEL Apply 2 g topically 4 (four) times daily. 100 g 3  . diphenhydrAMINE (BENADRYL) 25 MG tablet Take 25 mg by mouth every 6 (six) hours as needed. For post-nasal drip    . levocetirizine (XYZAL) 5 MG tablet Take 5 mg by mouth daily as needed. For post-nasal drip    . methocarbamol (ROBAXIN) 500 MG tablet Take 1 tablet (500 mg total) by mouth 3 (three) times daily. 90 tablet 0  .  Methylsulfonylmethane (MSM) 1000 MG CAPS Take by mouth.    . Multiple Vitamin (MULTIVITAMIN WITH MINERALS) TABS Take 1 tablet by mouth daily.    . Omega-3 Fatty Acids (FISH OIL) 1200 MG CAPS Take 1,200 mg by mouth daily.    Marland Kitchen PRESCRIPTION MEDICATION Inject 1 each into the skin once a week. Allergy injections    . VALTREX 1 G tablet Take 2 TABLETS BY MOUTH AND REPEATIN 12 HOURS OR AS DIRECTED 30 tablet 4  . hydrOXYzine (ATARAX/VISTARIL) 25 MG tablet Take 1 tablet (25 mg total) by mouth every 8 (eight) hours as needed. 30 tablet 0  . predniSONE (DELTASONE) 20 MG tablet Take 1 tablet (20 mg total) by mouth daily with breakfast. 8 tablet 0  . triamcinolone cream (KENALOG) 0.1 % Apply 1 application topically 2 (two) times daily. 30 g 0   No facility-administered medications prior to visit.     EXAM:  BP 120/84 mmHg  Pulse 68  Temp(Src) 98.7 F (37.1 C) (Oral)  Wt 128 lb (58.06 kg)  SpO2 99%  LMP 11/28/2014  Body mass index is 22.32 kg/(m^2).  GENERAL: vitals reviewed and listed above, alert, oriented, appears well hydrated and  in no acute distress HEENT: atraumatic, conjunctiva  clear, no obvious abnormalities on inspection of external nose and ears OP : no lesion edema or exudate  NECK: no obvious masses on inspection palpation  LUNGS: clear to auscultation bilaterally, no wheezes, rales or rhonchi, good air movement  Chest wall clear  Mildly  localized tender at cc junction  t 4-5  But no point tenderness  Abdomen:  Sof,t normal bowel sounds without hepatosplenomegaly, no guarding rebound or masses no CVA tenderness breast no masses  CV: HRRR, no clubbing cyanosis or  peripheral edema nl cap refill  MS: moves all extremities without noticeable focal  abnormality PSYCH: pleasant and cooperative, no obvious depression or anxiety EKG NSR no acute changes  ASSESSMENT AND PLAN:  Discussed the following assessment and plan:  Chest pain, unspecified chest pain type - Plan: EKG 12-Lead, Troponin I doesnt not sound like cas ets  Poss cp but new sx  .  t consider further eval if     persistent or progressive or assoc sx  No alarm sx noted today    No resp sx or exercise intolerance. Will follow  Troponin i today pt aware of risk benefot of this test. As OP -Patient advised to return or notify health care team  if symptoms worsen ,persist or new concerns arise.  Patient Instructions  ekg is normal  Will send toponin x 1 although  Usually repeat over time .   May be chest wall or other realted to MS  Problems  However if  persistent or progressive consider other evaluation  . Cardiology . Stress test etc.    Chest Pain (Nonspecific) It is often hard to give a specific diagnosis for the cause of chest pain. There is always a chance that your pain could be related to something serious, such as a heart attack or a blood clot in the lungs. You need to follow up with your health care provider for further evaluation. CAUSES   Heartburn.  Pneumonia or bronchitis.  Anxiety or stress.  Inflammation around your heart  (pericarditis) or lung (pleuritis or pleurisy).  A blood clot in the lung.  A collapsed lung (pneumothorax). It can develop suddenly on its own (spontaneous pneumothorax) or from trauma to the chest.  Shingles infection (herpes zoster virus).  The chest wall is composed of bones, muscles, and cartilage. Any of these can be the source of the pain.  The bones can be bruised by injury.  The muscles or cartilage can be strained by coughing or overwork.  The cartilage can be affected by inflammation and become sore (costochondritis). DIAGNOSIS  Lab tests or other studies may be needed to find the cause of your pain. Your health care provider may have you take a test called an ambulatory electrocardiogram (ECG). An ECG records your heartbeat patterns over a 24-hour period. You may also have other tests, such as:  Transthoracic echocardiogram (TTE). During echocardiography, sound waves are used to evaluate how blood flows through your heart.  Transesophageal echocardiogram (TEE).  Cardiac monitoring. This allows your health care provider to monitor your heart rate and rhythm in real time.  Holter monitor. This is a portable device that records your heartbeat and can help diagnose heart arrhythmias. It allows your health care provider to track your heart activity for several days, if needed.  Stress tests by exercise or by giving medicine that makes the heart beat faster. TREATMENT   Treatment depends on what may be causing your chest pain. Treatment may include:  Acid blockers for heartburn.  Anti-inflammatory medicine.  Pain medicine for inflammatory conditions.  Antibiotics if an infection is present.  You may be advised to change lifestyle habits. This includes stopping smoking and avoiding alcohol, caffeine, and chocolate.  You may be advised to keep your head raised (elevated) when sleeping. This reduces the chance of acid going backward from your stomach into your  esophagus. Most of the time, nonspecific chest pain will improve within 2-3 days with rest and mild pain medicine.  HOME CARE INSTRUCTIONS   If antibiotics were prescribed, take them as directed. Finish them even if you start to feel better.  For the next few days, avoid physical activities that bring on chest pain. Continue physical activities as directed.  Do not use any tobacco products, including cigarettes, chewing tobacco, or electronic cigarettes.  Avoid drinking alcohol.  Only take medicine as directed by your health care provider.  Follow your health care provider's suggestions for further testing if your chest pain does not go away.  Keep any follow-up appointments you made. If you do not go to an appointment, you could develop lasting (chronic) problems with pain. If there is any problem keeping an appointment, call to reschedule. SEEK MEDICAL CARE IF:   Your chest pain does not go away, even after treatment.  You have a rash with blisters on your chest.  You have a fever. SEEK IMMEDIATE MEDICAL CARE IF:   You have increased chest pain or pain that spreads to your arm, neck, jaw, back, or abdomen.  You have shortness of breath.  You have an increasing cough, or you cough up blood.  You have severe back or abdominal pain.  You feel nauseous or vomit.  You have severe weakness.  You faint.  You have chills. This is an emergency. Do not wait to see if the pain will go away. Get medical help at once. Call your local emergency services (911 in [place] Do not drive yourself to the hospital. MAKE SURE YOU:   Understand these instructions.  Will watch your condition.  Will get help right away if you are not doing well or get worse. Document Released: 05/15/2005 Document Revised: 08/10/2013 Document Reviewed: 03/10/2008 [facility] Patient Information 2015 [place], [place]. This information is not intended to replace  advice given to you by your health care provider.  Make sure you discuss any questions you have with your health care provider.      Neta Mends. Joleigh Mineau M.D.

## 2015-04-18 ENCOUNTER — Other Ambulatory Visit: Payer: Self-pay | Admitting: *Deleted

## 2015-04-18 ENCOUNTER — Encounter: Payer: Self-pay | Admitting: Internal Medicine

## 2015-04-18 MED ORDER — PREDNISONE 50 MG PO TABS: ORAL_TABLET | ORAL | Status: DC

## 2015-04-18 NOTE — Telephone Encounter (Signed)
Seeing sypher would be ok   Also can ask dr Katrinka Blazing   Next step.

## 2015-04-19 ENCOUNTER — Ambulatory Visit: Payer: 59 | Admitting: Family Medicine

## 2015-05-04 ENCOUNTER — Ambulatory Visit (INDEPENDENT_AMBULATORY_CARE_PROVIDER_SITE_OTHER): Payer: 59 | Admitting: Family Medicine

## 2015-05-04 ENCOUNTER — Ambulatory Visit (INDEPENDENT_AMBULATORY_CARE_PROVIDER_SITE_OTHER)
Admission: RE | Admit: 2015-05-04 | Discharge: 2015-05-04 | Disposition: A | Discharge status: Home / Self Care | Payer: 59 | Source: Ambulatory Visit | Attending: Family Medicine | Admitting: Family Medicine

## 2015-05-04 ENCOUNTER — Encounter: Payer: Self-pay | Admitting: Family Medicine

## 2015-05-04 ENCOUNTER — Other Ambulatory Visit (INDEPENDENT_AMBULATORY_CARE_PROVIDER_SITE_OTHER): Payer: 59

## 2015-05-04 VITALS — BP 114/70 | HR 74 | Ht 63.5 in | Wt 127.0 lb

## 2015-05-04 DIAGNOSIS — M9904 Segmental and somatic dysfunction of sacral region: Secondary | ICD-10-CM

## 2015-05-04 DIAGNOSIS — M9903 Segmental and somatic dysfunction of lumbar region: Secondary | ICD-10-CM

## 2015-05-04 DIAGNOSIS — M542 Cervicalgia: Secondary | ICD-10-CM | POA: Diagnosis not present

## 2015-05-04 DIAGNOSIS — M7541 Impingement syndrome of right shoulder: Secondary | ICD-10-CM | POA: Diagnosis not present

## 2015-05-04 DIAGNOSIS — M999 Biomechanical lesion, unspecified: Secondary | ICD-10-CM

## 2015-05-04 DIAGNOSIS — M9901 Segmental and somatic dysfunction of cervical region: Secondary | ICD-10-CM | POA: Diagnosis not present

## 2015-05-04 DIAGNOSIS — M25511 Pain in right shoulder: Secondary | ICD-10-CM

## 2015-05-04 NOTE — Assessment & Plan Note (Signed)
Decision today to treat with OMT was based on Physical Exam  After verbal consent patient was treated with HVLA, ME, FPR techniques in cervical, rib, thoracic, lumbar and sacral areas more muscle energy within the cervical spine.  Patient tolerated the procedure well with improvement in symptoms  Patient given exercises, stretches and lifestyle modifications  See medications in patient instructions if given  Patient will follow up in 3 weeks                             

## 2015-05-04 NOTE — Assessment & Plan Note (Signed)
Lately more secondary to muscle spasms. Prior to stay for her right shoulder. We discussed icing regimen and home exercises. Patient has muscle relaxers if needed. I'm thinking that she'll do very well with conservative therapy. We will see her again in 3 weeks. Continues to respond fairly well to osteopathic manipulation.

## 2015-05-04 NOTE — Progress Notes (Signed)
[name] [address] [place], [place] [address] Phone: [phone] Subjective:    CC: Neck and back pain follow up  Valerie [name] is a 49 y.o. female coming in with complaint of neck and back pain.  Patient has had some muscle spasms of the right trapezius resolving. Last time patient was seen we did do trigger point injections. States it does seem to help but unfortunately continues to give her difficulty. Patient continues to have pain mostly on the right side shoulder. States that she feels like she is compensating somewhat. States that it is starting to affect some of her mood. Patient states he can be very uncomfortable at night as well.  Past medical history, social, surgical and family history all reviewed in electronic medical record.   Review of Systems: No headache, visual changes, nausea, vomiting, diarrhea, constipation, dizziness, abdominal pain, skin rash, fevers, chills, night sweats, weight loss, swollen lymph nodes, body aches, joint swelling, muscle aches, chest pain, shortness of breath, mood changes.   Objective Blood pressure 114/70, pulse 74, height 5' 3.5" (1.613 m), weight 127 lb (57.607 kg), last menstrual period 11/28/2014, SpO2 98 %.  General: No apparent distress alert and oriented x3 mood and affect normal, dressed appropriately.  HEENT: Pupils equal, extraocular movements intact  Respiratory: Patient's speak in full sentences and does not appear short of breath  Cardiovascular: No lower extremity edema, non tender, no erythema  Skin: Warm dry intact with no signs of infection or rash on extremities or on axial skeleton.  Abdomen: Soft nontender  Neuro: Cranial nerves II through XII are intact, neurovascularly intact in all extremities with 2+ DTRs and 2+ pulses.  Lymph: No lymphadenopathy of posterior or anterior cervical chain or axillae bilaterally.  Gait normal with good balance and coordination.  MSK:  Non  tender with full range of motion and good stability and symmetric strength and tone of , elbows, wrist, hip, knee and ankles bilaterally.  Shoulder:right Inspection reveals no abnormalities, atrophy or asymmetry. Palpation is normal with no tenderness over AC joint or bicipital groove. ROM is full in all planes. Rotator cuff strength normal throughout. Positive impingement signs with Neer's and Hawkinsstill present Speeds and Yergason's tests normal. Questionable positive O'Brien's Normal scapular function observed. No painful arc and no drop arm sign. No apprehension sign    Neck: Inspection unremarkable. No palpable stepoffs. Negative Spurling's maneuver. Full neck range of motion tightness with rotation to the right Grip strength and sensation normal in bilateral hands Strength good C4 to T1 distribution No sensory change to C4 to T1 Negative Hoffman sign bilaterally Reflexes normal   Back Exam:  Inspection: Unremarkable  Motion: Flexion 45 deg improved, Extension 35 deg, Side Bending to 35 deg bilaterally,  Rotation to 45 deg bilaterally  SLR laying: Negative  XSLR laying: Negative  Tightness of the trapezius noted on right side and worse than previous FABER: negative. Sensory change: Gross sensation intact to all lumbar and sacral dermatomes.  Reflexes: 2+ at both patellar tendons, 2+ at achilles tendons, Babinski's downgoing.  Strength at foot  Plantar-flexion: 5/5 Dorsi-flexion: 5/5 Eversion: 5/5 Inversion: 5/5  Leg strength  Quad: 5/5 Hamstring: 5/5 Hip flexor: 5/5 Hip abductors: 4/5  Gait unremarkable.  OMT Physical Exam Cervical  C2 flexed rotated and side bent left C4 flexed rotated and side bent right   Thoracic T1 extended rotated and side bent left with elevated first ribsignificant muscle spasm in the trapezius T3 extended  rotated and side bent right inhaled right rib T5 extended rotated and side bent left  Lumbar L2 flexed rotated inside that  right  Sacrum Left on left  Illium neutral    MSK US performed of: Right This study was ordered, performed, and interpreted by Terrilee Files D.O.  Shoulder:   Supraspinatus:  Appears normal on long and transverse views, Bursal bulge seen with shoulder abduction on impingement view. Infraspinatus:  Appears normal on long and transverse views. Significant increase in Doppler flow Subscapularis:  Appears normal on long and transverse views. Positive bursa Teres Minor:  Appears normal on long and transverse views. AC joint:  Capsule undistended, no geyser sign. Glenohumeral Joint:  Appears normal without effusion. Glenoid Labrum:  Intact without visualized tears. Biceps Tendon:  Appears normal on long and transverse views, no fraying of tendon, tendon located in intertubercular groove, no subluxation with shoulder internal or external rotation.  Impression: Subacromial bursitis  Procedure: Real-time Ultrasound Guided Injection of right glenohumeral joint Device: GE Logiq E  Ultrasound guided injection is preferred based studies that show increased duration, increased effect, greater accuracy, decreased procedural pain, increased response rate with ultrasound guided versus blind injection.  Verbal informed consent obtained.  Time-out conducted.  Noted no overlying erythema, induration, or other signs of local infection.  Skin prepped in a sterile fashion.  Local anesthesia: Topical Ethyl chloride.  With sterile technique and under real time ultrasound guidance:  Joint visualized.  23g 1  inch needle inserted posterior approach. Pictures taken for needle placement. Patient did have injection of 2 cc of 1% lidocaine, 2 cc of 0.5% Marcaine, and 1.0 cc of Kenalog 40 mg/dL. Completed without difficulty  Pain immediately resolved suggesting accurate placement of the medication.  Advised to call if fevers/chills, erythema, induration, drainage, or persistent bleeding.  Images permanently  stored and available for review in the ultrasound unit.  Impression: Technically successful ultrasound guided injection.      Impression and Recommendations:     This case required medical decision making of moderate complexity.

## 2015-05-04 NOTE — Assessment & Plan Note (Signed)
Patient given injection today after ultrasound. Patient did tolerate the procedure well. I think that she will do relatively well. Patient given home exercises and will do this on a more regular basis. Patient is artery bent to formal physical therapy. We discussed the possibility of advanced imaging but because patient would not change medical management we will not do this at this time. We will get x-rays to rule out any other bony abnormality that could be contribute in. Patient will follow-up with me again in 3 weeks for further evaluation and treatment.

## 2015-05-04 NOTE — Patient Instructions (Addendum)
Good to see you Tried injection today and hope it helps Ice is your friend for sure in 6 hours.  Get xrays of neck and shoulder Keep doing everything else Give the shoulder 48 hours and no restriction on Saturday.  See me again in 3 weeks.

## 2015-05-05 ENCOUNTER — Encounter: Payer: Self-pay | Admitting: Family Medicine

## 2015-05-10 ENCOUNTER — Encounter: Payer: Self-pay | Admitting: Family Medicine

## 2015-05-25 ENCOUNTER — Ambulatory Visit (INDEPENDENT_AMBULATORY_CARE_PROVIDER_SITE_OTHER): Payer: 59 | Admitting: Family Medicine

## 2015-05-25 ENCOUNTER — Encounter: Payer: Self-pay | Admitting: Family Medicine

## 2015-05-25 VITALS — BP 120/80 | HR 70 | Ht 63.5 in | Wt 127.0 lb

## 2015-05-25 DIAGNOSIS — M9901 Segmental and somatic dysfunction of cervical region: Secondary | ICD-10-CM

## 2015-05-25 DIAGNOSIS — M9904 Segmental and somatic dysfunction of sacral region: Secondary | ICD-10-CM | POA: Diagnosis not present

## 2015-05-25 DIAGNOSIS — M542 Cervicalgia: Secondary | ICD-10-CM

## 2015-05-25 DIAGNOSIS — M9903 Segmental and somatic dysfunction of lumbar region: Secondary | ICD-10-CM | POA: Diagnosis not present

## 2015-05-25 DIAGNOSIS — M9902 Segmental and somatic dysfunction of thoracic region: Secondary | ICD-10-CM

## 2015-05-25 DIAGNOSIS — M999 Biomechanical lesion, unspecified: Secondary | ICD-10-CM

## 2015-05-25 MED ORDER — DICLOFENAC SODIUM 2 % TD SOLN: TRANSDERMAL | Status: DC

## 2015-05-25 NOTE — Progress Notes (Signed)
Pre visit review using our clinic review tool, if applicable. No additional management support is needed unless otherwise documented below in the visit note. 

## 2015-05-25 NOTE — Assessment & Plan Note (Signed)
Continues to have some instability. No radicular symptoms at this time. Still secondary to the posture. We will make changes and discussed with patient that I would like her to start a strength training that seem to be helping again. We'll started a decreased weight in increase slowly. Patient will come back and see me again in 4-6 weeks for further evaluation and treatment.

## 2015-05-25 NOTE — Progress Notes (Signed)
[name] [address] [place], [place] [address] Phone: [phone] Subjective:    CC: Neck and back pain follow up  WGN:[id number] Valerie [name] is a 49 y.o. female coming in with complaint of neck and back pain.  Patient has had some muscle spasms of the right trapezius.  Patient was found to have more of a subacromial bursitis of the right shoulder. Patient was given an injection and tolerated the procedure very well. Patient states since then her right shoulder pain has been very minimal. States that she is noticing more pain on the left shoulder. Patient denies any radiation down the arm. States that she is just having more of a discomfort that she's had. Continues to work on posture exercises. Is not working out yet because she is concerned that she will cause more harm to her previous shoulder injury.   Past medical history, social, surgical and family history all reviewed in electronic medical record.   Review of Systems: No headache, visual changes, nausea, vomiting, diarrhea, constipation, dizziness, abdominal pain, skin rash, fevers, chills, night sweats, weight loss, swollen lymph nodes, body aches, joint swelling, muscle aches, chest pain, shortness of breath, mood changes.   Objective Blood pressure 120/80, pulse 70, height 5' 3.5" (1.613 m), weight 127 lb (57.607 kg), last menstrual period 11/28/2014, SpO2 99 %.  General: No apparent distress alert and oriented x3 mood and affect normal, dressed appropriately.  HEENT: Pupils equal, extraocular movements intact  Respiratory: Patient's speak in full sentences and does not appear short of breath  Cardiovascular: No lower extremity edema, non tender, no erythema  Skin: Warm dry intact with no signs of infection or rash on extremities or on axial skeleton.  Abdomen: Soft nontender  Neuro: Cranial nerves II through XII are intact, neurovascularly intact in all extremities with 2+ DTRs and 2+ pulses.    Lymph: No lymphadenopathy of posterior or anterior cervical chain or axillae bilaterally.  Gait normal with good balance and coordination.  MSK:  Non tender with full range of motion and good stability and symmetric strength and tone of , elbows, wrist, hip, knee and ankles bilaterally.  Shoulder:right Inspection reveals no abnormalities, atrophy or asymmetry. Palpation is normal with no tenderness over AC joint or bicipital groove. ROM is full in all planes. Rotator cuff strength normal throughout. Negative impingement Speeds and Yergason's tests normal. Questionable positive O'Brien's Normal scapular function observed. No painful arc and no drop arm sign. No apprehension sign    Neck: Inspection unremarkable. No palpable stepoffs. Negative Spurling's maneuver. Full neck range of motion mild tightness of the left trapezius today Strength good C4 to T1 distribution No sensory change to C4 to T1 Negative Hoffman sign bilaterally Reflexes normal   Back Exam:  Inspection: Unremarkable  Motion: Flexion 45 deg improved, Extension 35 deg, Side Bending to 35 deg bilaterally,  Rotation to 45 deg bilaterally  SLR laying: Negative  XSLR laying: Negative  FABER: negative. Sensory change: Gross sensation intact to all lumbar and sacral dermatomes.  Reflexes: 2+ at both patellar tendons, 2+ at achilles tendons, Babinski's downgoing.  Strength at foot  Plantar-flexion: 5/5 Dorsi-flexion: 5/5 Eversion: 5/5 Inversion: 5/5  Leg strength  Quad: 5/5 Hamstring: 5/5 Hip flexor: 5/5 Hip abductors: 4/5  Gait unremarkable.  OMT Physical Exam Cervical  C2 flexed rotated and side bent left C4 flexed rotated and side bent right C6 flexed rotated and side bent left   Thoracic T1 extended rotated and side bent  left with elevated first ribsignificant muscle spasm in the trapezius T6 extended rotated and side bent left   Lumbar L2 flexed rotated inside that right  Sacrum Left on  left  Illium neutral      Impression and Recommendations:     This case required medical decision making of moderate complexity.

## 2015-05-25 NOTE — Patient Instructions (Addendum)
Good to see you and so much better Ice is your friend especially tonight Start working  Out again but at 50% weight and increase 10% every 2 weeks.  pennsaid pinkie amount topically 2 times daily as needed.  See me again in 4-6 weeks.

## 2015-05-25 NOTE — Assessment & Plan Note (Signed)
Decision today to treat with OMT was based on Physical Exam  After verbal consent patient was treated with HVLA, ME, FPR techniques in cervical, rib, thoracic, lumbar and sacral areas more muscle energy within the cervical spine.  Patient tolerated the procedure well with improvement in symptoms  Patient given exercises, stretches and lifestyle modifications  See medications in patient instructions if given  Patient will follow up in 4-6 weeks                                     

## 2015-06-27 ENCOUNTER — Telehealth: Payer: Self-pay | Admitting: Internal Medicine

## 2015-06-27 DIAGNOSIS — R079 Chest pain, unspecified: Secondary | ICD-10-CM

## 2015-06-27 NOTE — Telephone Encounter (Signed)
Ok to refer.

## 2015-06-27 NOTE — Telephone Encounter (Signed)
Pt would like referral to a cardiologist. Pt is experiencing same symptoms as she saw dr Fabian Sharppanosh for and would like to proceed with cardiologist appt.  Pt is requesting Dr Tenny Crawoss.

## 2015-06-27 NOTE — Telephone Encounter (Signed)
Referral placed in the system. 

## 2015-06-27 NOTE — Telephone Encounter (Signed)
[facility]  Will forward for authorization.

## 2015-06-28 ENCOUNTER — Emergency Department (HOSPITAL_COMMUNITY): Payer: 59

## 2015-06-28 ENCOUNTER — Emergency Department (HOSPITAL_COMMUNITY)
Admission: EM | Admit: 2015-06-28 | Discharge: 2015-06-28 | Disposition: A | Discharge status: Home / Self Care | Payer: 59 | Attending: Emergency Medicine | Admitting: Emergency Medicine

## 2015-06-28 ENCOUNTER — Telehealth: Payer: Self-pay | Admitting: Internal Medicine

## 2015-06-28 ENCOUNTER — Encounter (HOSPITAL_COMMUNITY): Payer: Self-pay | Admitting: Emergency Medicine

## 2015-06-28 DIAGNOSIS — Z8619 Personal history of other infectious and parasitic diseases: Secondary | ICD-10-CM | POA: Diagnosis not present

## 2015-06-28 DIAGNOSIS — Z79899 Other long term (current) drug therapy: Secondary | ICD-10-CM | POA: Diagnosis not present

## 2015-06-28 DIAGNOSIS — Z862 Personal history of diseases of the blood and blood-forming organs and certain disorders involving the immune mechanism: Secondary | ICD-10-CM | POA: Insufficient documentation

## 2015-06-28 DIAGNOSIS — R1013 Epigastric pain: Secondary | ICD-10-CM | POA: Diagnosis not present

## 2015-06-28 DIAGNOSIS — Z791 Long term (current) use of non-steroidal anti-inflammatories (NSAID): Secondary | ICD-10-CM | POA: Diagnosis not present

## 2015-06-28 DIAGNOSIS — Z880 Allergy status to penicillin: Secondary | ICD-10-CM | POA: Diagnosis not present

## 2015-06-28 DIAGNOSIS — K219 Gastro-esophageal reflux disease without esophagitis: Secondary | ICD-10-CM | POA: Diagnosis not present

## 2015-06-28 DIAGNOSIS — R079 Chest pain, unspecified: Secondary | ICD-10-CM | POA: Diagnosis present

## 2015-06-28 LAB — I-STAT TROPONIN, ED
Troponin i, poc: 0 ng/mL (ref 0.00–0.08)
Troponin i, poc: 0 ng/mL (ref 0.00–0.08)

## 2015-06-28 LAB — CBC
HCT: 37.5 % (ref 36.0–46.0)
HEMOGLOBIN: 12.9 g/dL (ref 12.0–15.0)
MCH: 29.8 pg (ref 26.0–34.0)
MCHC: 34.4 g/dL (ref 30.0–36.0)
MCV: 86.6 fL (ref 78.0–100.0)
Platelets: 201 10*3/uL (ref 150–400)
RBC: 4.33 MIL/uL (ref 3.87–5.11)
RDW: 11.8 % (ref 11.5–15.5)
WBC: 3.7 10*3/uL — ABNORMAL LOW (ref 4.0–10.5)

## 2015-06-28 LAB — BASIC METABOLIC PANEL
Anion gap: 7 (ref 5–15)
BUN: 13 mg/dL (ref 6–20)
CO2: 25 mmol/L (ref 22–32)
CREATININE: 0.64 mg/dL (ref 0.44–1.00)
Calcium: 9.2 mg/dL (ref 8.9–10.3)
Chloride: 105 mmol/L (ref 101–111)
GFR calc Af Amer: >60 mL/min (ref >60)
GFR calc non Af Amer: >60 mL/min (ref >60)
GLUCOSE: 91 mg/dL (ref 65–99)
POTASSIUM: 3.7 mmol/L (ref 3.5–5.1)
SODIUM: 137 mmol/L (ref 135–145)

## 2015-06-28 LAB — HEPATIC FUNCTION PANEL
ALBUMIN: 3.6 g/dL (ref 3.5–5.0)
ALK PHOS: 62 U/L (ref 38–126)
ALT: 20 U/L (ref 14–54)
AST: 21 U/L (ref 15–41)
BILIRUBIN DIRECT: 0.1 mg/dL (ref 0.1–0.5)
BILIRUBIN TOTAL: 0.4 mg/dL (ref 0.3–1.2)
Indirect Bilirubin: 0.3 mg/dL (ref 0.3–0.9)
Total Protein: 6.8 g/dL (ref 6.5–8.1)

## 2015-06-28 LAB — LIPASE, BLOOD: Lipase: 44 U/L (ref 11–51)

## 2015-06-28 MED ORDER — GI COCKTAIL ~~LOC~~
30.0000 mL | Freq: Once | ORAL | Status: AC
  Administered 2015-06-28: 30 mL via ORAL
  Filled 2015-06-28: qty 30

## 2015-06-28 MED ORDER — PANTOPRAZOLE SODIUM 40 MG PO TBEC: 40.0000 mg | DELAYED_RELEASE_TABLET | Freq: Every day | ORAL | Status: DC

## 2015-06-28 NOTE — Telephone Encounter (Signed)
  Ed thought sx could be gi related  Facilitate  Change cardiology appt  Referral To " urgent  "  If needed   Need  30 min appt   If doing ok can see her on Monday or tues  Am also

## 2015-06-28 NOTE — Telephone Encounter (Signed)
Pt was in [place] yesterday for chest pain. Pt said she needs an appt by Friday per discharge sheet. Can I create 30 min slot or Can md just refer her. Pt saw dr Fabian Sharppanosh in 8-16 for chest pain.

## 2015-06-28 NOTE — ED Provider Notes (Signed)
CSN: [id number]     Arrival date & time 06/28/15  0202 History  By signing my name below, I, Valerie [name], attest that this documentation has been prepared under the direction and in the presence of [employer], PA-C.  Electronically Signed: Netta [name], ED Scribe. 06/28/2015. 2:34 AM.    Chief Complaint  Patient presents with  . Chest Pain    The history is provided by the patient. No language interpreter was used.   HPI Comments: Valerie [name] is a 49 y.o. female who presents to the Emergency Department complaining of a intermittent, moderate, deep achy pain in her epigastric and central CP beginning 3 hours ago. Pt took a Pepcid AC PTA with no relief.  Pt reports associated pain in her upper back. She reports h/o similar episode occuring two days ago.  Pt denies radiating pain, SOB, painful breathing, nausea, vomiting, DOE, and dysuria. Pt also denies h/o hypertension, DM, andsmoking. No exertional symptoms. Pt has a history of hyperlipidemia.  Past Medical History  Diagnosis Date  . Anemia     NOS  . GERD (gastroesophageal reflux disease)   . HSV infection     labialis  . 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 Allergy     cats  . RECTAL BLEEDING 07/10/2007    Qualifier: Diagnosis of  By: Fabian Sharp MD, Neta Mends 2008 colonoscopy [facility].    Past Surgical History  Procedure Laterality Date  . Lasik  2009    mono vision  . Dilitation & currettage/hystroscopy with versapoint resection  08/27/2012    Procedure: DILATATION & CURETTAGE/HYSTEROSCOPY WITH VERSAPOINT RESECTION;  Surgeon: Genia Del, MD;  Location: [facility];  Service: Gynecology;;   Family History  Problem Relation Age of Onset  . Colon polyps Brother     elev TG  . Alcohol abuse Other   . Breast cancer Maternal Aunt   . Diabetes Father   . Hyperlipidemia Father   . Heart disease Father   . Stroke Maternal Grandmother   . Diabetes Maternal Grandmother   . Heart disease Maternal Grandmother    . Diabetes Maternal Grandfather   . Hyperlipidemia Brother    Social History  Substance Use Topics  . Smoking status: Never Smoker   . Smokeless tobacco: Never Used  . Alcohol Use: No   OB History    No data available     Review of Systems  Constitutional: Negative for fever and chills.  Respiratory: Negative for shortness of breath.   Cardiovascular: Positive for chest pain.  Gastrointestinal: Positive for abdominal pain (epigastrum). Negative for nausea and vomiting.  Genitourinary: Negative for dysuria.  All other systems reviewed and are negative.     Allergies  Amoxicillin-pot clavulanate; Ceftin; Clarithromycin; and Nsaids  Home Medications   Prior to Admission medications   Medication Sig Start Date End Date Taking? Authorizing Provider  b complex vitamins capsule Take 1 capsule by mouth daily.     Historical Provider, MD  cholecalciferol (VITAMIN D) 1000 UNITS tablet Take 1,000 Units by mouth daily.    Historical Provider, MD  Diclofenac Sodium (PENNSAID) 2 % SOLN Fingertip amount to affected area 05/25/15   Judi Saa, DO  diclofenac sodium (VOLTAREN) 1 % GEL Apply 2 g topically 4 (four) times daily. 12/21/14   Judi Saa, DO  diphenhydrAMINE (BENADRYL) 25 MG tablet Take 25 mg by mouth every 6 (six) hours as needed. For post-nasal drip    Historical Provider, MD  levocetirizine (XYZAL) 5 MG tablet Take 5 mg by mouth daily as needed. For post-nasal drip    Historical Provider, MD  methocarbamol (ROBAXIN) 500 MG tablet Take 1 tablet (500 mg total) by mouth 3 (three) times daily. 02/15/15   Judi Saa, DO  Methylsulfonylmethane (MSM) 1000 MG CAPS Take by mouth.    Historical Provider, MD  Multiple Vitamin (MULTIVITAMIN WITH MINERALS) TABS Take 1 tablet by mouth daily.    Historical Provider, MD  Omega-3 Fatty Acids (FISH OIL) 1200 MG CAPS Take 1,200 mg by mouth daily.    Historical Provider, MD  predniSONE (DELTASONE) 50 MG tablet Take 1 tablet daily.  04/18/15   Judi Saa, DO  PRESCRIPTION MEDICATION Inject 1 each into the skin once a week. Allergy injections    Historical Provider, MD  VALTREX 1 G tablet Take 2 TABLETS BY MOUTH AND REPEATIN 12 HOURS OR AS DIRECTED    Valerie Headings, MD   BP 123/74 mmHg  Pulse 71  Temp(Src) 97.1 F (36.2 C) (Oral)  Resp 16  SpO2 97%  LMP 11/28/2014 Physical Exam  Constitutional: She is oriented to person, place, and time. She appears well-developed and well-nourished.  HENT:  Head: Normocephalic and atraumatic.  Right Ear: External ear normal.  Left Ear: External ear normal.  Nose: Nose normal.  Eyes: Right eye exhibits no discharge. Left eye exhibits no discharge.  Cardiovascular: Normal rate, regular rhythm and normal heart sounds.   Pulmonary/Chest: Effort normal and breath sounds normal. She exhibits no tenderness.  Abdominal: Soft. There is tenderness. There is negative Murphy's sign.  Mild RUQ and Epigastric pain  Neurological: She is alert and oriented to person, place, and time.  Skin: Skin is warm and dry.  Nursing note and vitals reviewed.   ED Course  Procedures DIAGNOSTIC STUDIES: Oxygen Saturation is 97% on RA, normal by my interpretation.    2:34 AM COORDINATION OF CARE: Discussed treatment plan with pt. Pt agreed to plan.   Labs Review Labs Reviewed  CBC - Abnormal; Notable for the following:    WBC 3.7 (*)    All other components within normal limits  BASIC METABOLIC PANEL  HEPATIC FUNCTION PANEL  LIPASE, BLOOD  I-STAT TROPOININ, ED  Rosezena Sensor, ED    Imaging Review Dg Chest 2 View  06/28/2015  CLINICAL DATA:  Chest pain for 4 hours EXAM: CHEST  2 VIEW COMPARISON:  None. FINDINGS: Normal heart size and mediastinal contours. No acute infiltrate or edema. No effusion or pneumothorax. No acute osseous findings. IMPRESSION: Negative chest. Electronically Signed   By: Marnee Spring M.D.   On: 06/28/2015 02:37   US Abdomen Limited  06/28/2015  CLINICAL  DATA:  Epigastric pain for 3 days. EXAM: US ABDOMEN LIMITED - RIGHT UPPER QUADRANT COMPARISON:  04/27/2012 FINDINGS: Gallbladder: No gallstones or wall thickening visualized. No sonographic Murphy sign noted. Common bile duct: Diameter: 3 mm Liver: No focal lesion identified. Within normal limits in parenchymal echogenicity. Antegrade flow in the imaged portal venous system. IMPRESSION: Negative right upper quadrant ultrasound. Electronically Signed   By: Marnee Spring M.D.   On: 06/28/2015 03:11   I have personally reviewed and evaluated these lab results as part of my medical decision-making.   EKG Interpretation   Date/Time:  Wednesday June 28 2015 02:08:05 EST Ventricular Rate:  76 PR Interval:  154 QRS Duration: 88 QT Interval:  384 QTC Calculation: 432 R Axis:   82 Text Interpretation:  Normal sinus rhythm with  sinus arrhythmia Normal ECG  No old tracing to compare Confirmed by Madilyn Cephas  MD, Earla Charlie ([id number]) on  06/28/2015 2:12:11 AM      MDM   Final diagnoses:  Epigastric abdominal pain    Patient's pain is most likely gastric in etiology. Low suspicion for ACS, PE or dissection. Normal ECG and 2 negative troponins. Has hyperlipidemia and family history, otherwise low risk for coronary disease. Recommend PCP f/u. Plan to treat with PPI. U/S of RUQ negative. Discussed strict return precautions.  I personally performed the services described in this documentation, which was scribed in my presence. The recorded information has been reviewed and is accurate.   Pricilla LovelessScott Stanley Helmuth, MD 06/28/15 [phone]

## 2015-06-28 NOTE — ED Notes (Signed)
Pt. reports central chest / epigastric pain onset this evening , denies SOB , no nausea or diaphoresis , pt. took OTC Prevacid and Pepcid prior to arrival with mild relief - states history of GERD .

## 2015-06-29 ENCOUNTER — Ambulatory Visit: Payer: 59 | Admitting: Family Medicine

## 2015-06-29 NOTE — Telephone Encounter (Signed)
Pt cannot come in on Monday am. No appts on Friday. Pt cannot come in on Monday am. Wants afternoon appt. Is it ok to schedule? Dr Fabian SharpPanosh is doc of the day on Monday.

## 2015-06-29 NOTE — Telephone Encounter (Signed)
Left a message for a return call.

## 2015-06-30 NOTE — Telephone Encounter (Signed)
Ok with me   But make 30 minutes

## 2015-07-03 ENCOUNTER — Encounter: Payer: Self-pay | Admitting: Internal Medicine

## 2015-07-03 ENCOUNTER — Ambulatory Visit (INDEPENDENT_AMBULATORY_CARE_PROVIDER_SITE_OTHER): Payer: 59 | Admitting: Internal Medicine

## 2015-07-03 VITALS — BP 140/84 | Temp 98.7°F | Wt 125.1 lb

## 2015-07-03 DIAGNOSIS — R079 Chest pain, unspecified: Secondary | ICD-10-CM | POA: Diagnosis not present

## 2015-07-03 DIAGNOSIS — R1013 Epigastric pain: Secondary | ICD-10-CM | POA: Diagnosis not present

## 2015-07-03 NOTE — Patient Instructions (Addendum)
I agree with staying off  nsaids for now .   Stay on the protonix    And will order a biliary ejection scan  To check for gallbladder  dysfunction.   After about 4 weeks can wean to every other day or as needed.  Keep cardiology appt . Labs and evaluation are reassuring .   ROV   After cards eval and scan.

## 2015-07-03 NOTE — Progress Notes (Signed)
Pre visit review using our clinic review tool, if applicable. No additional management support is needed unless otherwise documented below in the visit note.  Chief Complaint  Patient presents with  . Follow-up    HPI:  Valerie [name] 49 y.o. Comes in for follow up of  edvisit for high epigastric pain  R/O cv cause Had eaten 730 - 8 evere [pressure  High epigastric low mid chest pressure Began late at night . Like a fist   No burning or Water blrash.  Radiated to back  EKG US limited abd and   CXR done in ed  Neg tropnin x 2  lipase Had normal egd.  for persitent epigastric pain despite ppi in 2013  Dr Jarold MottoPatterson  This was more long lasting  Neg SBBX These 2 episodes   6 hours   And then again the next night for 3  Hours  . Took  pepcid   And prevacid  Not help the day went to ed  Had other pains prob ms .    In past  otc  Had    Short temr sx   respond to otc.  ROS: See pertinent positives and negatives per HPI. rx   For shoulder   Couple monthe ago   Duxes.     Some nsaids but not for months  Some supplements tried   For  Ms issues  Fine  Today  No vomiting  No uti of gyne sx   Past Medical History  Diagnosis Date  . Anemia     NOS  . GERD (gastroesophageal reflux disease)   . HSV infection     labialis  . 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 Allergy     cats  . RECTAL BLEEDING 11/21/[name]    Qualifier: Diagnosis of  By: [name], Valerie [name] colonoscopy patterson.     Family History  Problem Relation Age of Onset  . Colon polyps Brother     elev TG  . Alcohol abuse Other   . Breast cancer Maternal Aunt   . Diabetes Father   . Hyperlipidemia Father   . Heart disease Father   . Stroke Maternal Grandmother   . Diabetes Maternal Grandmother   . Heart disease Maternal Grandmother   . Diabetes Maternal Grandfather   . Hyperlipidemia Brother     Social History   Social History  . Marital Status: Married    Spouse Name: N/A  . Number of Children: N/A  .  Years of Education: N/A   Occupational History  . PA [employer]   Social History Main Topics  . Smoking status: Never Smoker   . Smokeless tobacco: Never Used  . Alcohol Use: No  . Drug Use: No  . Sexual Activity: Not Asked   Other Topics Concern  . 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 cholecalciferol (VITAMIN D) 1000 UNITS tablet Take 1,000 Units by mouth daily.    . Methylsulfonylmethane (MSM) 1000 MG CAPS Take 1 capsule by mouth daily.     . Multiple Vitamin (MULTIVITAMIN WITH MINERALS) TABS Take 1 tablet by mouth daily.    .Marland Kitchen  Omega-3 Fatty Acids (FISH OIL) 1200 MG CAPS Take 1,200 mg by mouth daily.    . pantoprazole (PROTONIX) 40 MG tablet Take 1 tablet (40 mg total) by mouth daily. 30 tablet 0   No facility-administered medications prior to visit.     EXAM:  BP 140/84 mmHg  Temp(Src) 98.7 F (37.1 C) (Oral)  Wt 125 lb 1.6 oz (56.745 kg)  LMP 11/28/2014  Body mass index is 21.81 kg/(m^2).  GENERAL: vitals reviewed and listed above, alert, oriented, appears well hydrated and in no acute distress HEENT: atraumatic, conjunctiva  clear, no obvious abnormalities on inspection of external nose and ears  NECK: no obvious masses on inspection palpation  LUNGS: clear to auscultation bilaterally, no wheezes, rales or rhonchi, good air movement CV: HRRR, no clubbing cyanosis or  peripheral edema nl cap refill  Abdomen:  Sof,t normal bowel sounds without hepatosplenomegaly, no guarding rebound or masses no CVA tenderness  ponts to very high epigastrum  Area sub sternal?  MS: moves all extremities without noticeable focal  abnormality PSYCH: pleasant and cooperative, no obvious depression or anxiety Lab Results  Component Value Date   WBC 3.7*  06/28/2015   HGB 12.9 06/28/2015   HCT 37.5 06/28/2015   PLT 201 06/28/2015   GLUCOSE 91 06/28/2015   CHOL 197 11/29/2014   TRIG 47.0 11/29/2014   HDL 69.60 11/29/2014   LDLCALC 118* 11/29/2014   ALT 20 06/28/2015   AST 21 06/28/2015   NA 137 06/28/2015   K 3.7 06/28/2015   CL 105 06/28/2015   CREATININE 0.64 06/28/2015   BUN 13 06/28/2015   CO2 25 06/28/2015   TSH 1.76 11/29/2014    ASSESSMENT AND PLAN:  Discussed the following assessment and plan:  Epigastric abdominal pain - Plan: NM Hepato W/EjeCT Fract  Chest pain, unspecified chest pain type - Plan: NM Hepato W/EjeCT Fract Limit pill count supplements for now  Stay om protonix until evaluation   Card appt  / if need fro further testing  Vs other    HIda scan fopr biliary function eval.  -Patient advised to return or notify health care team  if symptoms worsen ,persist or new concerns arise.  Patient Instructions  I agree with staying off  nsaids for now .   Stay on the protonix    And will order a biliary ejection scan  To check for gallbladder  dysfunction.   After about 4 weeks can wean to every other day or as needed.  Keep cardiology appt . Labs and evaluation are reassuring .   ROV   After cards eval and scan.     Valerie Mends. Maeleigh Buschman M.D.

## 2015-07-03 NOTE — Telephone Encounter (Signed)
Pt has been sch

## 2015-07-05 ENCOUNTER — Encounter: Payer: Self-pay | Admitting: Family Medicine

## 2015-07-05 ENCOUNTER — Ambulatory Visit (INDEPENDENT_AMBULATORY_CARE_PROVIDER_SITE_OTHER): Payer: 59 | Admitting: Family Medicine

## 2015-07-05 VITALS — BP 116/74 | HR 76 | Ht 63.5 in | Wt 127.0 lb

## 2015-07-05 DIAGNOSIS — M9904 Segmental and somatic dysfunction of sacral region: Secondary | ICD-10-CM | POA: Diagnosis not present

## 2015-07-05 DIAGNOSIS — M9902 Segmental and somatic dysfunction of thoracic region: Secondary | ICD-10-CM | POA: Diagnosis not present

## 2015-07-05 DIAGNOSIS — K2100 Gastro-esophageal reflux disease with esophagitis, without bleeding: Secondary | ICD-10-CM

## 2015-07-05 DIAGNOSIS — M9903 Segmental and somatic dysfunction of lumbar region: Secondary | ICD-10-CM | POA: Diagnosis not present

## 2015-07-05 DIAGNOSIS — M9901 Segmental and somatic dysfunction of cervical region: Secondary | ICD-10-CM | POA: Diagnosis not present

## 2015-07-05 DIAGNOSIS — M542 Cervicalgia: Secondary | ICD-10-CM

## 2015-07-05 DIAGNOSIS — M9908 Segmental and somatic dysfunction of rib cage: Secondary | ICD-10-CM

## 2015-07-05 DIAGNOSIS — M999 Biomechanical lesion, unspecified: Secondary | ICD-10-CM

## 2015-07-05 NOTE — Assessment & Plan Note (Signed)
Discussed 1 week trial of increasing PPIs to twice daily.

## 2015-07-05 NOTE — Assessment & Plan Note (Signed)
Respond well to osteopathic manipulation. We discussed icing regimen and home exercises. We discussed which activities to do an which was potentially avoid. Attending patient's chest pain is likely secondary to the gastroesophageal reflux as well as causing some inflammation of the anterior chest wall. Patient did respond fairly well to manipulation today. Patient come back again in 4 weeks for further evaluation.

## 2015-07-05 NOTE — Progress Notes (Signed)
[name] [address] [place], [place] [address] Phone: [phone] Subjective:    CC: Neck and back pain follow up  [id number] Valerie [name] is a 49 y.o. female coming in with complaint of neck and back pain.  Patient has had some muscle spasms of the right trapezius.  Patient was found to have more of a subacromial bursitis of the right shoulder. Patient was given an injection and tolerated the procedure very well. Multiple months ago.  Patient was having some left-sided chest pain. Patient did see primary care and workup was unremarkable. There is a possibility that this was more of a gastroesophageal reflux disease. Patient even went into the emergency department initially. Patient does take Protonix fairly regularly. Patient states that she continues to have some discomfort there. Things that it is more musculoskeletal. Hurts with certain range of motion but can even hurt at rest. Patient does not remember any true injury. Seems to be more on the anterior chest wall ventrally a shoulder. Concerned that the topical anti-inflammatories is make it worse.    reviewed patient's EKG which was unremarkable. Patient also had an abdominal ultrasound that was unremarkable. Patient is awaiting gallbladder tests.  Past medical history, social, surgical and family history all reviewed in electronic medical record.   Review of Systems: No headache, visual changes, nausea, vomiting, diarrhea, constipation, dizziness, abdominal pain, skin rash, fevers, chills, night sweats, weight loss, swollen lymph nodes, body aches, joint swelling, muscle aches, chest pain, shortness of breath, mood changes.   Objective Blood pressure 116/74, pulse 76, height 5' 3.5" (1.613 m), weight 127 lb (57.607 kg), last menstrual period 11/28/2014, SpO2 98 %.  General: No apparent distress alert and oriented x3 mood and affect normal, dressed appropriately.  HEENT: Pupils equal, extraocular  movements intact  Respiratory: Patient's speak in full sentences and does not appear short of breath  Cardiovascular: No lower extremity edema, non tender, no erythema  Skin: Warm dry intact with no signs of infection or rash on extremities or on axial skeleton.  Abdomen: Soft nontender  Neuro: Cranial nerves II through XII are intact, neurovascularly intact in all extremities with 2+ DTRs and 2+ pulses.  Lymph: No lymphadenopathy of posterior or anterior cervical chain or axillae bilaterally.  Gait normal with good balance and coordination.  MSK:  Non tender with full range of motion and good stability and symmetric strength and tone of , elbows, wrist, hip, knee and ankles bilaterally.  Shoulder:right Inspection reveals no abnormalities, atrophy or asymmetry. Palpation is normal with no tenderness over AC joint or bicipital groove. ROM is full in all planes. Rotator cuff strength normal throughout. Negative impingement Speeds and Yergason's tests normal. Questionable positive O'Brien's Normal scapular function observed. No painful arc and no drop arm sign. No apprehension sign    Neck: Inspection unremarkable. No palpable stepoffs. Negative Spurling's maneuver. Full neck range of motion mild tightness of the left trapezius today Strength good C4 to T1 distribution No sensory change to C4 to T1 Negative Hoffman sign bilaterally Reflexes normal  patient's chest wall is tender to palpation over the fourth rib. No crepitus noted.  Back Exam:  Inspection: Unremarkable  Motion: Flexion 45 deg improved, Extension 35 deg, Side Bending to 35 deg bilaterally,  Rotation to 45 deg bilaterally  SLR laying: Negative  XSLR laying: Negative  FABER: negative. Sensory change: Gross sensation intact to all lumbar and sacral dermatomes.  Reflexes: 2+ at both patellar tendons, 2+ at achilles  tendons, Babinski's downgoing.  Strength at foot  Plantar-flexion: 5/5 Dorsi-flexion: 5/5 Eversion:  5/5 Inversion: 5/5  Leg strength  Quad: 5/5 Hamstring: 5/5 Hip flexor: 5/5 Hip abductors: 4/5  Gait unremarkable.  OMT Physical Exam Cervical  C2 flexed rotated and side bent left C4 flexed rotated and side bent right C6 flexed rotated and side bent left   Thoracic T1 extended rotated and side bent left with elevated first ribsignificant muscle spasm in the trapezius  T3 and T4 extended rotated and side bent left   Lumbar L2 flexed rotated inside that right  Sacrum Left on left  Illium neutral      Impression and Recommendations:     This case required medical decision making of moderate complexity.

## 2015-07-05 NOTE — Patient Instructions (Addendum)
Always good to see you Ice is your friend

## 2015-07-05 NOTE — Progress Notes (Signed)
Pre visit review using our clinic review tool, if applicable. No additional management support is needed unless otherwise documented below in the visit note. 

## 2015-07-05 NOTE — Assessment & Plan Note (Signed)
Decision today to treat with OMT was based on Physical Exam  After verbal consent patient was treated with HVLA, ME, FPR techniques in cervical, rib, thoracic, lumbar and sacral areas more muscle energy within the cervical spine.  Patient tolerated the procedure well with improvement in symptoms  Patient given exercises, stretches and lifestyle modifications  See medications in patient instructions if given  Patient will follow up in 4-6 weeks

## 2015-07-18 ENCOUNTER — Encounter (HOSPITAL_COMMUNITY)
Admission: RE | Admit: 2015-07-18 | Discharge: 2015-07-18 | Disposition: A | Discharge status: Home / Self Care | Payer: 59 | Source: Ambulatory Visit | Attending: Internal Medicine | Admitting: Internal Medicine

## 2015-07-18 DIAGNOSIS — R079 Chest pain, unspecified: Secondary | ICD-10-CM | POA: Insufficient documentation

## 2015-07-18 DIAGNOSIS — R1013 Epigastric pain: Secondary | ICD-10-CM | POA: Insufficient documentation

## 2015-07-18 MED ORDER — TECHNETIUM TC 99M MEBROFENIN IV KIT
5.5000 | PACK | Freq: Once | INTRAVENOUS | Status: AC | PRN
  Administered 2015-07-18: 6 via INTRAVENOUS

## 2015-07-20 ENCOUNTER — Ambulatory Visit (INDEPENDENT_AMBULATORY_CARE_PROVIDER_SITE_OTHER): Payer: 59 | Admitting: Internal Medicine

## 2015-07-20 ENCOUNTER — Encounter: Payer: Self-pay | Admitting: Gastroenterology

## 2015-07-20 ENCOUNTER — Other Ambulatory Visit: Payer: Self-pay

## 2015-07-20 ENCOUNTER — Encounter: Payer: Self-pay | Admitting: Internal Medicine

## 2015-07-20 VITALS — BP 112/76 | HR 78 | Ht 63.5 in | Wt 127.1 lb

## 2015-07-20 DIAGNOSIS — R0789 Other chest pain: Secondary | ICD-10-CM | POA: Diagnosis not present

## 2015-07-20 DIAGNOSIS — Z1231 Encounter for screening mammogram for malignant neoplasm of breast: Secondary | ICD-10-CM

## 2015-07-20 NOTE — Progress Notes (Signed)
Cardiology Office Note   Date:  07/20/2015   ID:  TEAH VOTAW, DOB Jan 02, 1966, MRN [id number]  PCP:  Lorretta Harp, MD  Cardiologist:   Dietrich Pates, MD   Chief Complaint  Patient presents with  . New Patient (Initial Visit)  . chest discomfort      History of Present Illness: Valerie [name] is a 49 y.o. female with a history of chest pain.  She was seen in ER on 11/9 Pt says she has had intermitt CP in past. Since this fall she has had increased episodes of chest discomfort.  She has had spells of epigastric/chest discomfort Occur before bedtime.  Denies acid sensation in mouth.  Early in Novmeber she had an episode that lasted about 6 hours  Eased on own.  THe next day she had episode at night  Tried Prilosec.  Again occurred before bed.  Seen in ER.  Given Protonix.  She has not had a bad spell since. Denies CP with activity  No SOB  Able to walk stairs without a problem Has small dinner at night  Small cup of tea.         Current Outpatient Prescriptions  Medication Sig Dispense Refill  . cholecalciferol (VITAMIN D) 1000 UNITS tablet Take 1,000 Units by mouth daily.    . methocarbamol (ROBAXIN) 500 MG tablet Take 500 mg by mouth 2 (two) times daily as needed for muscle spasms.    . Methylsulfonylmethane (MSM) 1000 MG CAPS Take 1 capsule by mouth daily.     . Multiple Vitamin (MULTIVITAMIN WITH MINERALS) TABS Take 1 tablet by mouth daily.    . Omega-3 Fatty Acids (FISH OIL) 1200 MG CAPS Take 1,200 mg by mouth daily.    . pantoprazole (PROTONIX) 40 MG tablet Take 1 tablet (40 mg total) by mouth daily. 30 tablet 0  . TURMERIC PO Take by mouth.     No current facility-administered medications for this visit.    Allergies:   Amoxicillin-pot clavulanate; Ceftin; Clarithromycin; and Nsaids   Past Medical History  Diagnosis Date  . Anemia     NOS  . GERD (gastroesophageal reflux disease)   . HSV infection     labialis  . Female fertility problem     hx of  treatment  . Allergy     cats  . RECTAL BLEEDING 07/10/2007    Qualifier: Diagnosis of  By: Fabian Sharp MD, Neta Mends 2008 colonoscopy patterson.     Past Surgical History  Procedure Laterality Date  . Lasik  2009    mono vision  . Dilitation & currettage/hystroscopy with versapoint resection  08/27/2012    Procedure: DILATATION & CURETTAGE/HYSTEROSCOPY WITH VERSAPOINT RESECTION;  Surgeon: Genia Del, MD;  Location: [facility];  Service: Gynecology;;     Social History:  The patient  reports that she has never smoked. She has never used smokeless tobacco. She reports that she does not drink alcohol or use illicit drugs.   Family History:  The patient's family history includes Alcohol abuse in her other; Breast cancer in her maternal aunt; Colon polyps in her brother; Diabetes in her father, maternal grandfather, and maternal grandmother; Heart disease in her father and maternal grandmother; Hyperlipidemia in her brother and father; Stroke in her maternal grandmother.    ROS:  Please see the history of present illness. All other systems are reviewed and  Negative to the above problem except as noted.    PHYSICAL EXAM: VS:  BP 112/76 mmHg  Pulse 78  Ht 5' 3.5" (1.613 m)  Wt 57.661 kg (127 lb 1.9 oz)  BMI 22.16 kg/m2  LMP 11/28/2014  GEN: Well nourished, well developed, in no acute distress HEENT: normal Neck: no JVD, carotid bruits, or masses Cardiac: RRR; no murmurs, rubs, or gallops,no edema  Respiratory:  clear to auscultation bilaterally, normal work of breathing GI: soft, nontender, nondistended, + BS  No hepatomegaly  MS: no deformity Moving all extremities   Skin: warm and dry, no rash Neuro:  Strength and sensation are intact Psych: euthymic mood, full affect   EKG:  EKG is not  ordered today.  In ER SR 76 bpm     Lipid Panel    Component Value Date/Time   CHOL 197 11/29/2014 0734   TRIG 47.0 11/29/2014 0734   HDL 69.60 11/29/2014 0734   CHOLHDL 3 11/29/2014 0734     VLDL 9.4 11/29/2014 0734   LDLCALC 118* 11/29/2014 0734      Wt Readings from Last 3 Encounters:  07/20/15 57.661 kg (127 lb 1.9 oz)  07/05/15 57.607 kg (127 lb)  07/03/15 56.745 kg (125 lb 1.6 oz)      ASSESSMENT AND PLAN:  1.  Chest pain  I do not think CP is cardiac in origin  Occurred one time during day  Not associated with activity  Suspicious for GI  I told her to look at diet more closely.  COntinue Protonix I have also set the pt to be seen by Lamont SnowballM Stark in Jan.    Will be available as needed.     Signed, Dietrich PatesPaula Rahul Malinak, MD  07/20/2015 4:08 PM    [facility] [address], [place], [place]  [address] Phone: [phone](336) [phone]; Fax: [phone]

## 2015-07-20 NOTE — Patient Instructions (Addendum)
Your physician recommends that you schedule a follow-up appointment as needed with Dr. Tenny Crawoss.  You have an appointment with  Dr. Claudette HeadMalcolm Stark on January 20, 10:15 am.

## 2015-08-02 ENCOUNTER — Ambulatory Visit: Payer: Self-pay | Admitting: Family Medicine

## 2015-08-10 ENCOUNTER — Encounter: Payer: Self-pay | Admitting: Family Medicine

## 2015-08-10 ENCOUNTER — Ambulatory Visit (INDEPENDENT_AMBULATORY_CARE_PROVIDER_SITE_OTHER): Payer: 59 | Admitting: Family Medicine

## 2015-08-10 VITALS — BP 112/70 | HR 87 | Ht 63.5 in | Wt 128.0 lb

## 2015-08-10 DIAGNOSIS — M9901 Segmental and somatic dysfunction of cervical region: Secondary | ICD-10-CM | POA: Diagnosis not present

## 2015-08-10 DIAGNOSIS — M9903 Segmental and somatic dysfunction of lumbar region: Secondary | ICD-10-CM | POA: Diagnosis not present

## 2015-08-10 DIAGNOSIS — M542 Cervicalgia: Secondary | ICD-10-CM | POA: Diagnosis not present

## 2015-08-10 DIAGNOSIS — M9904 Segmental and somatic dysfunction of sacral region: Secondary | ICD-10-CM | POA: Diagnosis not present

## 2015-08-10 DIAGNOSIS — M999 Biomechanical lesion, unspecified: Secondary | ICD-10-CM

## 2015-08-10 NOTE — Patient Instructions (Addendum)
Good to see you Ice when you need it and you need it Try uloric daily for 1 week Look into low oxalate diet.  Stay active and get a little more movement, it may help some of the pain Have a great holiday and see me again in 3 weeks and send me a message at the end of the week to know how you are doing

## 2015-08-10 NOTE — Progress Notes (Signed)
Pre visit review using our clinic review tool, if applicable. No additional management support is needed unless otherwise documented below in the visit note. 

## 2015-08-10 NOTE — Progress Notes (Signed)
Tawana ScaleZach Jaz Laningham D.O. [facility] [address] [place], [place] [address] Phone: [phone](336) [phone] Subjective:    CC: Neck and back pain follow up  IRJ:JOACZYSAYTHPI:Subjective Valerie [name] is a 49 y.o. female coming in with complaint of neck and back pain.  Patient has had some muscle spasms of the right trapezius.  Patient was found to have more of a subacromial bursitis of the right shoulder. Patient was given an injection and tolerated the procedure very well. Multiple months ago.    Patient did tentatively chest pain and did cause a emergency department and workup was unremarkable.recent follow-up with cardiology felt that this was more of a gastroenterology problem and encourage patient to continue on a PPI. Patient states overall she has felt better but continues to have intermittent and in the anterior chest wall. No significant) 4 reason when it occurs.  Patient states that she continues to have more of a dull aching sensation in both of her shoulders as well as recently. Has been worked up for autoimmune diseases thyroid has been normal. Feels like she is having the bursitis like she had previously. Once to avoid any other type of steroid injections at this moment if possible.    Past medical history, social, surgical and family history all reviewed in electronic medical record.   Review of Systems: No headache, visual changes, nausea, vomiting, diarrhea, constipation, dizziness, abdominal pain, skin rash, fevers, chills, night sweats, weight loss, swollen lymph nodes, body aches, joint swelling, muscle aches, chest pain, shortness of breath, mood changes.   Objective Blood pressure 112/70, pulse 87, height 5' 3.5" (1.613 m), weight 128 lb (58.06 kg), last menstrual period 11/28/2014, SpO2 98 %.  General: No apparent distress alert and oriented x3 mood and affect normal, dressed appropriately.  HEENT: Pupils equal, extraocular movements intact  Respiratory: Patient's speak in full sentences  and does not appear short of breath  Cardiovascular: No lower extremity edema, non tender, no erythema  Skin: Warm dry intact with no signs of infection or rash on extremities or on axial skeleton.  Abdomen: Soft nontender  Neuro: Cranial nerves II through XII are intact, neurovascularly intact in all extremities with 2+ DTRs and 2+ pulses.  Lymph: No lymphadenopathy of posterior or anterior cervical chain or axillae bilaterally.  Gait normal with good balance and coordination.  MSK:  Non tender with full range of motion and good stability and symmetric strength and tone of , elbows, wrist, hip, knee and ankles bilaterally.  Shoulder:right Inspection reveals no abnormalities, atrophy or asymmetry. Palpation is normal with no tenderness over AC joint or bicipital groove. ROM is full in all planes. Mild tightness from  Rotator cuff strength normal throughout. Negative impingement Speeds and Yergason's tests normal. Questionable positive O'Brien's Normal scapular function observed. No painful arc and no drop arm sign. No apprehension sign    Neck: Inspection unremarkable. No palpable stepoffs. Negative Spurling's maneuver. Increased tightness from previous exam but still full range of motion Strength good C4 to T1 distribution No sensory change to C4 to T1 Negative Hoffman sign bilaterally Reflexes normal  patient's chest wall is tender to palpation over the fourth rib. No crepitus noted.  Back Exam:  Inspection: Unremarkable  Motion: Flexion 45 deg improved, Extension 35 deg, Side Bending to 35 deg bilaterally,  Rotation to 45 deg bilaterally  SLR laying: Negative  XSLR laying: Negative  FABER: negative. Sensory change: Gross sensation intact to all lumbar and sacral dermatomes.  Reflexes: 2+ at both patellar  tendons, 2+ at achilles tendons, Babinski's downgoing.  Strength at foot  Plantar-flexion: 5/5 Dorsi-flexion: 5/5 Eversion: 5/5 Inversion: 5/5  Leg strength  Quad: 5/5  Hamstring: 5/5 Hip flexor: 5/5 Hip abductors: 4/5  Gait unremarkable.  OMT Physical Exam Cervical  C2 flexed rotated and side bent left C4 flexed rotated and side bent right C6 flexed rotated and side bent left   Thoracic T1 extended rotated and side bent left with elevated first ribsignificant muscle spasm in the trapezius  T3 and T4 extended rotated and side bent left   Lumbar L2 flexed rotated inside that right  Sacrum Left on left  Illium neutral      Impression and Recommendations:     This case required medical decision making of moderate complexity.

## 2015-08-10 NOTE — Assessment & Plan Note (Signed)
Decision today to treat with OMT was based on Physical Exam  After verbal consent patient was treated with HVLA, ME, FPR techniques in cervical, rib, thoracic, lumbar and sacral areas more muscle energy within the cervical spine.  Patient tolerated the procedure well with improvement in symptoms  Patient given exercises, stretches and lifestyle modifications  See medications in patient instructions if given  Patient will follow up in 4-6 weeks                                     

## 2015-08-10 NOTE — Assessment & Plan Note (Signed)
Patient is having more discomfort of the upper back as well as shoulders bilaterally. We have done a good workup before its no significant findings. Patient does have some mild arthritic changes of the neck but very minimal. Patient alsohas had some impingement of the shoulders previously. We've done workup previously except for gout. Patient will do a week trial of some medications to see if this will help with some of the arthralgias she is having. We discussed icing regimen. We discussed home exercises. Patient will try to remain active which she has not been doing recently secondary to stress as well as discomfort. Patient and will come back and see me again in 3 weeks for further evaluation and treatment.

## 2015-08-18 ENCOUNTER — Encounter: Payer: Self-pay | Admitting: Family Medicine

## 2015-08-23 ENCOUNTER — Ambulatory Visit
Admission: RE | Admit: 2015-08-23 | Discharge: 2015-08-23 | Disposition: A | Discharge status: Home / Self Care | Payer: 59 | Source: Ambulatory Visit

## 2015-08-23 DIAGNOSIS — J3089 Other allergic rhinitis: Secondary | ICD-10-CM | POA: Diagnosis not present

## 2015-08-23 DIAGNOSIS — J301 Allergic rhinitis due to pollen: Secondary | ICD-10-CM | POA: Diagnosis not present

## 2015-08-23 DIAGNOSIS — Z1231 Encounter for screening mammogram for malignant neoplasm of breast: Secondary | ICD-10-CM

## 2015-08-23 DIAGNOSIS — J3081 Allergic rhinitis due to animal (cat) (dog) hair and dander: Secondary | ICD-10-CM | POA: Diagnosis not present

## 2015-08-28 ENCOUNTER — Encounter: Payer: Self-pay | Admitting: Family Medicine

## 2015-08-31 ENCOUNTER — Ambulatory Visit: Payer: 59 | Admitting: Family Medicine

## 2015-08-31 ENCOUNTER — Ambulatory Visit (INDEPENDENT_AMBULATORY_CARE_PROVIDER_SITE_OTHER): Payer: 59 | Admitting: Family Medicine

## 2015-08-31 ENCOUNTER — Encounter: Payer: Self-pay | Admitting: Family Medicine

## 2015-08-31 ENCOUNTER — Encounter: Payer: Self-pay | Admitting: Gastroenterology

## 2015-08-31 ENCOUNTER — Ambulatory Visit (INDEPENDENT_AMBULATORY_CARE_PROVIDER_SITE_OTHER): Payer: 59 | Admitting: Gastroenterology

## 2015-08-31 VITALS — BP 112/74 | HR 82 | Ht 63.5 in | Wt 126.0 lb

## 2015-08-31 VITALS — BP 110/62 | HR 80 | Ht 63.5 in | Wt 126.0 lb

## 2015-08-31 DIAGNOSIS — K219 Gastro-esophageal reflux disease without esophagitis: Secondary | ICD-10-CM

## 2015-08-31 DIAGNOSIS — K921 Melena: Secondary | ICD-10-CM

## 2015-08-31 DIAGNOSIS — M9902 Segmental and somatic dysfunction of thoracic region: Secondary | ICD-10-CM

## 2015-08-31 DIAGNOSIS — M9901 Segmental and somatic dysfunction of cervical region: Secondary | ICD-10-CM

## 2015-08-31 DIAGNOSIS — M9903 Segmental and somatic dysfunction of lumbar region: Secondary | ICD-10-CM

## 2015-08-31 DIAGNOSIS — R079 Chest pain, unspecified: Secondary | ICD-10-CM | POA: Diagnosis not present

## 2015-08-31 DIAGNOSIS — M542 Cervicalgia: Secondary | ICD-10-CM | POA: Diagnosis not present

## 2015-08-31 DIAGNOSIS — M999 Biomechanical lesion, unspecified: Secondary | ICD-10-CM

## 2015-08-31 DIAGNOSIS — M9908 Segmental and somatic dysfunction of rib cage: Secondary | ICD-10-CM

## 2015-08-31 MED ORDER — PANTOPRAZOLE SODIUM 40 MG PO TBEC: 40.0000 mg | DELAYED_RELEASE_TABLET | Freq: Every day | ORAL | Status: DC

## 2015-08-31 MED ORDER — NA SULFATE-K SULFATE-MG SULF 17.5-3.13-1.6 GM/177ML PO SOLN: 1.0000 | Freq: Once | ORAL | Status: DC

## 2015-08-31 NOTE — Progress Notes (Signed)
Pre visit review using our clinic review tool, if applicable. No additional management support is needed unless otherwise documented below in the visit note. 

## 2015-08-31 NOTE — Patient Instructions (Signed)
Much much better overall It is always a delight to see you Keep doing what you are doing and stay out of the wind tunnel you call an office See me again in 4-6 weeks.

## 2015-08-31 NOTE — Assessment & Plan Note (Signed)
Decision today to treat with OMT was based on Physical Exam  After verbal consent patient was treated with HVLA, ME, FPR techniques in cervical, rib, thoracic, lumbar and sacral areas more muscle energy within the cervical spine.  Patient tolerated the procedure well with improvement in symptoms  Patient given exercises, stretches and lifestyle modifications  See medications in patient instructions if given  Patient will follow up in 4-6 weeks                                     

## 2015-08-31 NOTE — Progress Notes (Signed)
    History of Present Illness: This is a 50 year old female referred by Panosh, Neta MendsWanda K, MD for the evaluation of his pain and hematochezia. Patient's previously been evaluated by Dr. Jarold MottoPatterson for epigastric pain. In 03/2012 she underwent an EGD which was normal. She had problems with chest pain in November 2016 and after taking a month of Protonix her symptoms resolved. In addition she has had chest pain and epigastric discomfort in response to steroids and NSAIDs. She no longer has any symptoms and she is not currently on medication. She has occasional episodes of small amounts of bright red blood per rectum that occur intermittently. She has occasional anal discomfort and notes anal tags. She previously underwent colonoscopy by Dr. Jarold MottoPatterson in December 2008 for small volume hematochezia and iron deficiency anemia. The colonoscopy was normal. Denies weight loss, abdominal pain, constipation, diarrhea, change in stool caliber, melena, nausea, vomiting, dysphagia.  Review of Systems: Pertinent positive and negative review of systems were noted in the above HPI section. All other review of systems were otherwise negative.  Current Medications, Allergies, Past Medical History, Past Surgical History, Family History and Social History were reviewed in [facility] electronic medical record.  Physical Exam: General: Well developed, well nourished, no acute distress Head: Normocephalic and atraumatic Eyes:  sclerae anicteric, EOMI Ears: Normal auditory acuity Mouth: No deformity or lesions Neck: Supple, no masses or thyromegaly Lungs: Clear throughout to auscultation Heart: Regular rate and rhythm; no murmurs, rubs or bruits Abdomen: Soft, non tender and non distended. No masses, hepatosplenomegaly or hernias noted. Normal Bowel sounds Rectal: deferred to colonoscopy Musculoskeletal: Symmetrical with no gross deformities  Skin: No lesions on visible extremities Pulses:  Normal pulses  noted Extremities: No clubbing, cyanosis, edema or deformities noted Neurological: Alert oriented x 4, grossly nonfocal Cervical Nodes:  No significant cervical adenopathy Inguinal Nodes: No significant inguinal adenopathy Psychological:  Alert and cooperative. Normal mood and affect  Assessment and Recommendations:  1. Suspsected intermittent GERD symptoms. Dyspeptic symptoms from NSAIDs and corticosteroids. EGD in 03/2012 by Dr. Jarold MottoPatterson was normal. Protonix daily as needed. Antireflux measures.   2. Small volume hematochezia, likely benign anorectal source. Schedule colonoscopy. The risks (including bleeding, perforation, infection, missed lesions, medication reactions and possible hospitalization or surgery if complications occur), benefits, and alternatives to colonoscopy with possible biopsy and possible polypectomy were discussed with the patient and they consent to proceed.    cc: Madelin HeadingsWanda K Panosh, MD [address] [place], [place] [id number]

## 2015-08-31 NOTE — Assessment & Plan Note (Signed)
Do believe the patient's neck pain is multifactorial. We discussed ergonomics are up-to-date, home exercises, icing protocol, we discussed which activities to potentially avoid. Continues to respond well to the manipulation. No significant changes in management today. Patient will come back and see me again in 4-6 weeks for further evaluation and treatment.

## 2015-08-31 NOTE — Patient Instructions (Addendum)
We have sent the following medications to your pharmacy for you to pick up at your convenience:Protonix.  Patient advised to avoid spicy, acidic, citrus, chocolate, mints, fruit and fruit juices.  Limit the intake of caffeine, alcohol and Soda.  Don't exercise too soon after eating.  Don't lie down within 3-4 hours of eating.  Elevate the head of your bed.   You have been scheduled for a colonoscopy. Please follow written instructions given to you at your visit today.  Please pick up your prep supplies at the pharmacy within the next 1-3 days. If you use inhalers (even only as needed), please bring them with you on the day of your procedure. Your physician has requested that you go to www.startemmi.com and enter the access code given to you at your visit today. This web site gives a general overview about your procedure. However, you should still follow specific instructions given to you by our office regarding your preparation for the procedure.  Thank you for choosing me and [facility].  Venita LickMalcolm T. Pleas KochStark, Jr., MD., Clementeen GrahamFACG

## 2015-08-31 NOTE — Progress Notes (Signed)
Valerie ScaleZach [name] D.O. [facility] [address] [place], [place] [address] Phone: [phone] Subjective:    CC: Neck and back pain follow up  BJY:NWGNFAOZHYHPI:Subjective Valerie [name] is a 50 y.o. female coming in with complaint of neck and back pain.     Patient states that she continues to have more of a dull aching sensation in both of her shoulders as well as recently. Patient's has noticed that it seemed to be more the ergonomics at work as well as a cold wind in her new office. Patient has changed things around and has noticed improvement. His remaining active overall. Denies any numbness or tingling. Patient states that overall she seems to be feeling better. Has always responded well to osteopathic manipulation.    Past medical history, social, surgical and family history all reviewed and no pertinent info pertaining to chief complaint. All other was reviewed using the EMR.  Family History  Problem Relation Age of Onset  . Colon polyps Brother     elev TG  . Alcohol abuse Other   . Breast cancer Maternal Aunt   . Diabetes Father   . Hyperlipidemia Father   . Heart disease Father   . Stroke Maternal Grandmother   . Diabetes Maternal Grandmother   . Heart disease Maternal Grandmother   . Diabetes Maternal Grandfather   . Hyperlipidemia Brother     elevated triglycerides  . Stroke Father   . Liver disease Maternal Uncle     alcohol related      Review of Systems: No headache, visual changes, nausea, vomiting, diarrhea, constipation, dizziness, abdominal pain, skin rash, fevers, chills, night sweats, weight loss, swollen lymph nodes, body aches, joint swelling, muscle aches, chest pain, shortness of breath, mood changes.   Objective Blood pressure 112/74, pulse 82, height 5' 3.5" (1.613 m), weight 126 lb (57.153 kg), last menstrual period 11/28/2014, SpO2 98 %.  General: No apparent distress alert and oriented x3 mood and affect normal, dressed appropriately.  HEENT: Pupils  equal, extraocular movements intact  Respiratory: Patient's speak in full sentences and does not appear short of breath  Cardiovascular: No lower extremity edema, non tender, no erythema  Skin: Warm dry intact with no signs of infection or rash on extremities or on axial skeleton.  Abdomen: Soft nontender  Neuro: Cranial nerves II through XII are intact, neurovascularly intact in all extremities with 2+ DTRs and 2+ pulses.  Lymph: No lymphadenopathy of posterior or anterior cervical chain or axillae bilaterally.  Gait normal with good balance and coordination.  MSK:  Non tender with full range of motion and good stability and symmetric strength and tone of , elbows, wrist, hip, knee and ankles bilaterally.  Shoulder:right Inspection reveals no abnormalities, atrophy or asymmetry. Palpation is normal with no tenderness over AC joint or bicipital groove. ROM is full in all planes. Mild tightness from  Rotator cuff strength normal throughout. Negative impingement Speeds and Yergason's tests normal. Questionable positive O'Brien's Normal scapular function observed. No painful arc and no drop arm sign. No apprehension sign    Neck: Inspection unremarkable. No palpable stepoffs. Negative Spurling's maneuver. Mild tightness noted still on the left side more than the right Strength good C4 to T1 distribution No sensory change to C4 to T1 Negative Hoffman sign bilaterally Reflexes normal No crepitus noted.  Back Exam:  Inspection: Unremarkable  Motion: Flexion 45 deg improved, Extension 35 deg, Side Bending to 35 deg bilaterally,  Rotation to 45 deg bilaterally  SLR  laying: Negative  XSLR laying: Negative  FABER: negative. Sensory change: Gross sensation intact to all lumbar and sacral dermatomes.  Reflexes: 2+ at both patellar tendons, 2+ at achilles tendons, Babinski's downgoing.  Strength at foot  Plantar-flexion: 5/5 Dorsi-flexion: 5/5 Eversion: 5/5 Inversion: 5/5  Leg  strength  Quad: 5/5 Hamstring: 5/5 Hip flexor: 5/5 Hip abductors: 4/5  Gait unremarkable.  OMT Physical Exam Cervical  C2 flexed rotated and side bent left C4 flexed rotated and side bent right C6 flexed rotated and side bent left   Thoracic T1 extended rotated and side bent left with elevated first ribsignificant muscle spasm in the trapezius  T3 and T4 extended rotated and side bent left   Lumbar L2 flexed rotated inside that right  Sacrum Left on left  Illium neutral      Impression and Recommendations:     This case required medical decision making of moderate complexity.

## 2015-09-06 DIAGNOSIS — J3081 Allergic rhinitis due to animal (cat) (dog) hair and dander: Secondary | ICD-10-CM | POA: Diagnosis not present

## 2015-09-06 DIAGNOSIS — J301 Allergic rhinitis due to pollen: Secondary | ICD-10-CM | POA: Diagnosis not present

## 2015-09-06 DIAGNOSIS — J3089 Other allergic rhinitis: Secondary | ICD-10-CM | POA: Diagnosis not present

## 2015-09-13 ENCOUNTER — Other Ambulatory Visit: Payer: Self-pay | Admitting: Family Medicine

## 2015-09-15 MED FILL — METHOCARBAMOL 500 MG TABLET: 500 | 30 days supply | Qty: 90 | Fill #0

## 2015-09-15 NOTE — Telephone Encounter (Signed)
Refill done.  

## 2015-09-19 ENCOUNTER — Telehealth: Payer: Self-pay | Admitting: Family Medicine

## 2015-09-19 NOTE — Telephone Encounter (Signed)
Pt sent an appt request through Mychart stating "Just wanted to see if I can move my appointment up my appointment with Dr. Katrinka Blazing. I pulled muscle under my left scapula and would like to come in if he has a cancellation on Wed or Thurs afternoon."  Please let me know if you're able to get her in or not.

## 2015-09-20 NOTE — Telephone Encounter (Signed)
Called pt back, offered her appt tomorrow at 3pm

## 2015-09-20 NOTE — Telephone Encounter (Signed)
Pt called back & confirmed appt for tomorroat at 3pm.

## 2015-09-21 ENCOUNTER — Ambulatory Visit (INDEPENDENT_AMBULATORY_CARE_PROVIDER_SITE_OTHER): Payer: 59 | Admitting: Family Medicine

## 2015-09-21 ENCOUNTER — Encounter: Payer: Self-pay | Admitting: Family Medicine

## 2015-09-21 VITALS — BP 112/80 | HR 78 | Ht 63.5 in | Wt 126.0 lb

## 2015-09-21 DIAGNOSIS — M9902 Segmental and somatic dysfunction of thoracic region: Secondary | ICD-10-CM

## 2015-09-21 DIAGNOSIS — M9901 Segmental and somatic dysfunction of cervical region: Secondary | ICD-10-CM

## 2015-09-21 DIAGNOSIS — M999 Biomechanical lesion, unspecified: Secondary | ICD-10-CM

## 2015-09-21 DIAGNOSIS — M9908 Segmental and somatic dysfunction of rib cage: Secondary | ICD-10-CM | POA: Diagnosis not present

## 2015-09-21 DIAGNOSIS — M25512 Pain in left shoulder: Secondary | ICD-10-CM

## 2015-09-21 MED ORDER — GABAPENTIN 100 MG PO CAPS: 200.0000 mg | ORAL_CAPSULE | Freq: Every day | ORAL | Status: DC

## 2015-09-21 MED ORDER — METHOCARBAMOL 500 MG PO TABS: 500.0000 mg | ORAL_TABLET | Freq: Three times a day (TID) | ORAL | Status: DC

## 2015-09-21 MED FILL — GABAPENTIN 100 MG CAPSULE: 100 | 30 days supply | Qty: 60 | Fill #0

## 2015-09-21 NOTE — Progress Notes (Signed)
[name] [address] [place], [place] [address] Phone: [phone] Subjective:    CC: Neck and back pain follow up  HYQ:MVHQIONGEX IPEK WESTRA is a 50 y.o. female coming in with complaint of neck and back pain.   Having worsening symptoms. Seems to be more of an exacerbation. Patient states having more of a spasm on the left side. Can even turn her hand. Having difficulty with that. Did take a muscle relaxer last night to help her sleep. Rates the severity of pain a 6 out of 10. Denies any significant radiation down the arm. Starting to notice that is starting to happen on the other side as well. Patient has had x-rays before.  Does not remember any true injury. Patient has had x-rays of her neck back in September 2016. These were independently visualized by me and show no significant bony normality.  Past medical history, social, surgical and family history all reviewed and no pertinent info pertaining to chief complaint. All other was reviewed using the EMR.  Family History  Problem Relation Age of Onset  . Colon polyps Brother     elev TG  . Alcohol abuse Other   . Breast cancer Maternal Aunt   . Diabetes Father   . Hyperlipidemia Father   . Heart disease Father   . Stroke Maternal Grandmother   . Diabetes Maternal Grandmother   . Heart disease Maternal Grandmother   . Diabetes Maternal Grandfather   . Hyperlipidemia Brother     elevated triglycerides  . Stroke Father   . Liver disease Maternal Uncle     alcohol related      Review of Systems: No headache, visual changes, nausea, vomiting, diarrhea, constipation, dizziness, abdominal pain, skin rash, fevers, chills, night sweats, weight loss, swollen lymph nodes, body aches, joint swelling, muscle aches, chest pain, shortness of breath, mood changes.   Objective Blood pressure 112/80, pulse 78, height 5' 3.5" (1.613 m), weight 126 lb (57.153 kg), last menstrual period 11/28/2014, SpO2 99  %.  General: No apparent distress alert and oriented x3 mood and affect normal, dressed appropriately.  HEENT: Pupils equal, extraocular movements intact  Respiratory: Patient's speak in full sentences and does not appear short of breath  Cardiovascular: No lower extremity edema, non tender, no erythema  Skin: Warm dry intact with no signs of infection or rash on extremities or on axial skeleton.  Abdomen: Soft nontender  Neuro: Cranial nerves II through XII are intact, neurovascularly intact in all extremities with 2+ DTRs and 2+ pulses.  Lymph: No lymphadenopathy of posterior or anterior cervical chain or axillae bilaterally.  Gait normal with good balance and coordination.  MSK:  Non tender with full range of motion and good stability and symmetric strength and tone of , elbows, wrist, hip, knee and ankles bilaterally.  Shoulder:right Inspection reveals no abnormalities, atrophy or asymmetry. Palpation is normal with no tenderness over AC joint or bicipital groove. ROM is full in all planes. Mild tightness from  Rotator cuff strength normal throughout. Negative impingement Speeds and Yergason's tests normal. Negative O'Brien's Normal scapular function observed. No painful arc and no drop arm sign. No apprehension sign    Neck: Inspection unremarkable. No palpable stepoffs. Negative Spurling's maneuver. Severe muscle spasm on the left trapezius compared to previous exam. Mild limitation with left-sided rotation and side bending No sensory change to C4 to T1 Negative Hoffman sign bilaterally Reflexes normal No crepitus noted.  Back Exam:  Inspection: Unremarkable  Motion: Flexion 45 deg improved, Extension 35 deg, Side Bending to 35 deg bilaterally,  Rotation to 45 deg bilaterally  SLR laying: Negative  XSLR laying: Negative  FABER: negative. Sensory change: Gross sensation intact to all lumbar and sacral dermatomes.  Reflexes: 2+ at both patellar tendons, 2+ at achilles  tendons, Babinski's downgoing.  Strength at foot  Plantar-flexion: 5/5 Dorsi-flexion: 5/5 Eversion: 5/5 Inversion: 5/5  Leg strength  Quad: 5/5 Hamstring: 5/5 Hip flexor: 5/5 Hip abductors: 4/5  Gait unremarkable.  OMT Physical Exam Cervical  C2 flexed rotated and side bent left C4 flexed rotated and side bent right C6 flexed rotated and side bent left   Thoracic T1 extended rotated and side bent left with elevated first ribsignificant muscle spasm in the trapezius T4 extended rotated and side bent left   Lumbar L2 flexed rotated inside that right  Sacrum Left on left  Procedure note After verbal consent patient was prepped with alcohol swabs and with a 25-gauge half-inch needle was injected with a total of 3 mL of 0.5% Marcaine and 1 mL of Kenalog 49 and per deciliter in for trigger points in the left trapezius muscle.      Impression and Recommendations:     This case required medical decision making of moderate complexity.

## 2015-09-21 NOTE — Assessment & Plan Note (Signed)
Decision today to treat with OMT was based on Physical Exam  After verbal consent patient was treated with HVLA, ME, FPR techniques in cervical, rib, thoracic, lumbar and sacral areas more muscle energy within the cervical spine.  Patient tolerated the procedure well with improvement in symptoms  Patient given exercises, stretches and lifestyle modifications  See medications in patient instructions if given  Patient will follow up in 2 weeks   Question of stress seems to be a exacerbating factor.

## 2015-09-21 NOTE — Progress Notes (Signed)
Pre visit review using our clinic review tool, if applicable. No additional management support is needed unless otherwise documented below in the visit note. 

## 2015-09-21 NOTE — Patient Instructions (Signed)
Good to see you  Heat 20, ice 20 then off for 20 minutes and repeat as much as you would like Robaxin refilled Gabapentin  at night and can go up to  nightly if needed Send me a message Monday and tell me how you are doing.  Keep your other appointment for now.

## 2015-09-21 NOTE — Assessment & Plan Note (Signed)
Given an injection today and tolerated the procedure well. Discussed icing regimen and home exercises. Discussed which activities to do an which was potentially avoid. Patient will come back and see me again in 1 week. If worsening symptoms advance imaging of the neck may be necessary. Patient's x-rays were independently visualized by me today and had no bony normality.

## 2015-09-26 ENCOUNTER — Encounter: Payer: Self-pay | Admitting: Family Medicine

## 2015-09-28 ENCOUNTER — Encounter: Payer: Self-pay | Admitting: Family Medicine

## 2015-09-28 ENCOUNTER — Ambulatory Visit (INDEPENDENT_AMBULATORY_CARE_PROVIDER_SITE_OTHER): Payer: 59 | Admitting: Family Medicine

## 2015-09-28 VITALS — BP 120/80 | HR 75 | Wt 125.0 lb

## 2015-09-28 DIAGNOSIS — M542 Cervicalgia: Secondary | ICD-10-CM

## 2015-09-28 DIAGNOSIS — M999 Biomechanical lesion, unspecified: Secondary | ICD-10-CM

## 2015-09-28 DIAGNOSIS — M9903 Segmental and somatic dysfunction of lumbar region: Secondary | ICD-10-CM

## 2015-09-28 DIAGNOSIS — M9901 Segmental and somatic dysfunction of cervical region: Secondary | ICD-10-CM | POA: Diagnosis not present

## 2015-09-28 DIAGNOSIS — M9902 Segmental and somatic dysfunction of thoracic region: Secondary | ICD-10-CM | POA: Diagnosis not present

## 2015-09-28 MED ORDER — GABAPENTIN 300 MG PO CAPS: 300.0000 mg | ORAL_CAPSULE | Freq: Every day | ORAL | Status: DC

## 2015-09-28 MED FILL — GABAPENTIN 300 MG CAPSULE: 300 | 90 days supply | Qty: 90 | Fill #0

## 2015-09-28 NOTE — Assessment & Plan Note (Signed)
Decision today to treat with OMT was based on Physical Exam  After verbal consent patient was treated with HVLA, ME, FPR techniques in cervical, rib, thoracic, lumbar and sacral areas more muscle energy within the cervical spine.  Patient tolerated the procedure well with improvement in symptoms  Patient given exercises, stretches and lifestyle modifications  See medications in patient instructions if given  Patient will follow up in 2 weeks

## 2015-09-28 NOTE — Assessment & Plan Note (Signed)
Concerned than patient's muscle spasms is secondary to a nerve root impingement. I think that this is seems to be likely would patient's pattern as well as responding to the gabapentin lightly. Patient does not have any radicular symptoms we do have time. Patient's was given the option of either acupuncture or dry needling. Patient declined both of these. Do not feel that further manipulation other than what was done today would been beneficial. Discussed with patient that if any worsening symptoms advance imaging could be warranted. Increase patient's gabapentin to 300 mg. Does have muscle relaxer for breakthrough pain. Encourage her to continue the other over-the-counter medications. Follow-up in 2 weeks.

## 2015-09-28 NOTE — Progress Notes (Signed)
[name] [address] [place], [place] [address] Phone: [phone] Subjective:    CC: Neck and back pain follow up  BJY:NWGNFAOZHY Valerie [name] is a 50 y.o. female coming in with complaint of neck and back pain.   Patient is here for exacerbation of her neck pain. Patient did have some trigger points at last visit. Was given an injection. States that those are better. Having one on the contralateral side. Seems to be a little more uncomfortable. Still not perfect. Has had improved range of motion of the neck. Patient was put on a higher dose of gabapentin and has noticed that this has been helpful. Denies any weakness or any radiation down the arm. Still not back to her baseline..  Past medical history, social, surgical and family history all reviewed and no pertinent info pertaining to chief complaint. All other was reviewed using the EMR.   Past Medical History  Diagnosis Date  . Anemia     NOS  . GERD (gastroesophageal reflux disease)   . HSV infection     labialis  . 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 Allergy     cats  . RECTAL BLEEDING 07/10/2007    Qualifier: Diagnosis of  By: Fabian Sharp MD, Neta Mends 2008 colonoscopy patterson.    Past Surgical History  Procedure Laterality Date  . Lasik  2009    mono vision  . Dilitation & currettage/hystroscopy with versapoint resection  08/27/2012    Procedure: DILATATION & CURETTAGE/HYSTEROSCOPY WITH VERSAPOINT RESECTION;  Surgeon: Genia Del, MD;  Location: [facility];  Service: Gynecology;;   Social History   Social History  . Marital Status: Married    Spouse Name: N/A  . Number of Children: N/A  . Years of Education: N/A   Occupational History  . 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 Smoking status: Never Smoker   . Smokeless tobacco: Never Used  . Alcohol Use: 0.0 oz/week    0 Standard drinks or equivalent per week     Comment: occasional  . Drug Use: No   . Sexual Activity: Not on file   Other Topics Concern  . 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 Amoxicillin-Pot Clavulanate Diarrhea  . Ceftin [Cefuroxime Axetil] Other (See Comments)    Severe yeast infection  . Clarithromycin Other (See Comments)    Significant metallic taste  . Nsaids Nausea Only and Other (See Comments)    Abdominal pain / severe reflux     Family History  Problem Relation Age of Onset  . Colon polyps Brother     elev TG  . Alcohol abuse Other   . Breast cancer Maternal Aunt   . Diabetes Father   . Hyperlipidemia Father   . Heart disease Father   . Stroke Maternal Grandmother   . Diabetes Maternal Grandmother   . Heart disease Maternal Grandmother   . Diabetes Maternal Grandfather   . Hyperlipidemia Brother     elevated triglycerides  . Stroke Father   . Liver disease Maternal Uncle     alcohol related      Review of Systems:  No headache, visual changes, nausea, vomiting, diarrhea, constipation, dizziness, abdominal pain, skin rash, fevers, chills, night sweats, weight loss, swollen lymph nodes, body aches, joint swelling, muscle aches, chest pain, shortness of breath, mood changes.   Objective Blood pressure 120/80, pulse 75, weight 125 lb (56.7 kg), last menstrual period 11/28/2014, SpO2 99 %.  General: No apparent distress alert and oriented x3 mood and affect normal, dressed appropriately.  HEENT: Pupils equal, extraocular movements intact  Respiratory: Patient's speak in full sentences and does not appear short of breath  Cardiovascular: No lower extremity edema, non tender, no erythema  Skin: Warm dry intact with no signs of infection or rash on extremities or on axial skeleton.  Abdomen: Soft nontender  Neuro: Cranial nerves II through XII are intact,  neurovascularly intact in all extremities with 2+ DTRs and 2+ pulses.  Lymph: No lymphadenopathy of posterior or anterior cervical chain or axillae bilaterally.  Gait normal with good balance and coordination.  MSK:  Non tender with full range of motion and good stability and symmetric strength and tone of , elbows, wrist, hip, knee and ankles bilaterally.  Shoulder:right Inspection reveals no abnormalities, atrophy or asymmetry. Palpation is normal with no tenderness over AC joint or bicipital groove. ROM is full in all planes. Mild tightness from  Rotator cuff strength normal throughout. Negative impingement Speeds and Yergason's tests normal. Negative O'Brien's Normal scapular function observed. No painful arc and no drop arm sign. No apprehension sign    Neck: Inspection unremarkable. No palpable stepoffs. Negative Spurling's maneuver. Has pain though. Continues to have a small muscle spasm on the left side for worsening muscle spasm on the right side. No sensory change to C4 to T1 Negative Hoffman sign bilaterally Reflexes normal No crepitus noted.  Back Exam:  Inspection: Unremarkable  Motion: Flexion 45 deg improved, Extension 35 deg, Side Bending to 35 deg bilaterally,  Rotation to 45 deg bilaterally  SLR laying: Negative  XSLR laying: Negative  FABER: negative. Sensory change: Gross sensation intact to all lumbar and sacral dermatomes.  Reflexes: 2+ at both patellar tendons, 2+ at achilles tendons, Babinski's downgoing.  Strength at foot  Plantar-flexion: 5/5 Dorsi-flexion: 5/5 Eversion: 5/5 Inversion: 5/5  Leg strength  Quad: 5/5 Hamstring: 5/5 Hip flexor: 5/5 Hip abductors: 4/5  Gait unremarkable.  OMT Physical Exam Cervical  C2 flexed rotated and side bent left C4 flexed rotated and side bent right   Thoracic T1 extended rotated and side bent left with elevated first ribsignificant muscle spasm in the trapezius T2 extended rotated and side bent right  with trigger point over the area. T4 extended rotated and side bent left   Lumbar L2 flexed rotated inside that right  Sacrum Left on left       Impression and Recommendations:     This case required medical decision making of moderate complexity.

## 2015-09-28 NOTE — Patient Instructions (Signed)
Good to see you  Gabapentin  at what time you want Keep doing everything else Arnica lotion could help with the bruising (sorry) Send me a message early next week and tell me how you are doing.  Otherwise I would like to see you in 2 weeks.  If it gets worse we may need to consider MRI.

## 2015-10-02 ENCOUNTER — Encounter: Payer: 59 | Admitting: Gastroenterology

## 2015-10-04 DIAGNOSIS — J3081 Allergic rhinitis due to animal (cat) (dog) hair and dander: Secondary | ICD-10-CM | POA: Diagnosis not present

## 2015-10-04 DIAGNOSIS — J3089 Other allergic rhinitis: Secondary | ICD-10-CM | POA: Diagnosis not present

## 2015-10-04 DIAGNOSIS — J301 Allergic rhinitis due to pollen: Secondary | ICD-10-CM | POA: Diagnosis not present

## 2015-10-11 DIAGNOSIS — J301 Allergic rhinitis due to pollen: Secondary | ICD-10-CM | POA: Diagnosis not present

## 2015-10-11 DIAGNOSIS — J3081 Allergic rhinitis due to animal (cat) (dog) hair and dander: Secondary | ICD-10-CM | POA: Diagnosis not present

## 2015-10-11 DIAGNOSIS — J3089 Other allergic rhinitis: Secondary | ICD-10-CM | POA: Diagnosis not present

## 2015-10-18 ENCOUNTER — Ambulatory Visit: Payer: 59 | Admitting: Family Medicine

## 2015-10-19 MED FILL — EPINEPHRINE 0.3 MG AUTO-INJ: 0.3 | 30 days supply | Qty: 2 | Fill #0

## 2015-10-24 DIAGNOSIS — J3081 Allergic rhinitis due to animal (cat) (dog) hair and dander: Secondary | ICD-10-CM | POA: Diagnosis not present

## 2015-10-24 DIAGNOSIS — J301 Allergic rhinitis due to pollen: Secondary | ICD-10-CM | POA: Diagnosis not present

## 2015-10-24 DIAGNOSIS — J3089 Other allergic rhinitis: Secondary | ICD-10-CM | POA: Diagnosis not present

## 2015-11-02 ENCOUNTER — Encounter: Payer: Self-pay | Admitting: Family Medicine

## 2015-11-02 ENCOUNTER — Ambulatory Visit (INDEPENDENT_AMBULATORY_CARE_PROVIDER_SITE_OTHER): Payer: 59 | Admitting: Family Medicine

## 2015-11-02 VITALS — BP 120/72 | HR 81 | Ht 63.5 in | Wt 122.0 lb

## 2015-11-02 DIAGNOSIS — M9903 Segmental and somatic dysfunction of lumbar region: Secondary | ICD-10-CM

## 2015-11-02 DIAGNOSIS — M9902 Segmental and somatic dysfunction of thoracic region: Secondary | ICD-10-CM

## 2015-11-02 DIAGNOSIS — M9904 Segmental and somatic dysfunction of sacral region: Secondary | ICD-10-CM | POA: Diagnosis not present

## 2015-11-02 DIAGNOSIS — M9901 Segmental and somatic dysfunction of cervical region: Secondary | ICD-10-CM | POA: Diagnosis not present

## 2015-11-02 DIAGNOSIS — M542 Cervicalgia: Secondary | ICD-10-CM

## 2015-11-02 DIAGNOSIS — M999 Biomechanical lesion, unspecified: Secondary | ICD-10-CM

## 2015-11-02 NOTE — Assessment & Plan Note (Signed)
Decision today to treat with OMT was based on Physical Exam  After verbal consent patient was treated with HVLA, ME, FPR techniques in cervical, rib, thoracic, lumbar and sacral areas more muscle energy within the cervical spine.  Patient tolerated the procedure well with improvement in symptoms  Patient given exercises, stretches and lifestyle modifications  See medications in patient instructions if given  Patient will follow up in 5 weeks

## 2015-11-02 NOTE — Progress Notes (Signed)
Pre visit review using our clinic review tool, if applicable. No additional management support is needed unless otherwise documented below in the visit note. 

## 2015-11-02 NOTE — Assessment & Plan Note (Signed)
Neck x-rays seem to be normal. We have discussed the possibility of advanced imaging but that patient continues to decline case she would not want to do an epidural or any type of surgical intervention. Encourage her to continue the gabapentin. We discussed starting to do the exercises more regular basis. Patient will follow-up and see me again in 5 weeks for further evaluation and treatment.

## 2015-11-02 NOTE — Patient Instructions (Signed)
Verbal instructions given

## 2015-11-02 NOTE — Progress Notes (Signed)
Tawana ScaleZach Smith D.O. [facility] [address] [place], [place] [address] Phone: [phone] Subjective:    CC: Neck and back pain follow up  BJY:NWGNFAOZHYHPI:Subjective Valerie Creeamela S [name] is a 50 y.o. female coming in with complaint of neck and back pain.   Patient was last seen greater than 6 weeks ago. Overall is doing relatively well. Patient has not been doing a lot of upper body exercising though. States that the gabapentin has been helping. Having as much radiation down the arm. Not having as much weakness. Patient states she  the past patient has responded fairly well to trigger point injections when needed. feels like she is making some progress.  Past medical history, social, surgical and family history all reviewed and no pertinent info pertaining to chief complaint. All other was reviewed using the EMR.   Past Medical History  Diagnosis Date  . Anemia     NOS  . GERD (gastroesophageal reflux disease)   . HSV infection     labialis  . 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 Allergy     cats  . RECTAL BLEEDING 07/10/2007    Qualifier: Diagnosis of  By: [name], Neta MendsWanda K 2008 colonoscopy [facility].    Past Surgical History  Procedure Laterality Date  . Lasik  2009    mono vision  . Dilitation & currettage/hystroscopy with versapoint resection  08/27/2012    Procedure: DILATATION & CURETTAGE/HYSTEROSCOPY WITH VERSAPOINT RESECTION;  Surgeon: Genia DelMarie-Lyne Lavoie, MD;  Location: [facility];  Service: Gynecology;;   Social History   Social History  . Marital Status: Married    Spouse Name: N/A  . Number of Children: N/A  . Years of Education: N/A   Occupational History  . 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 Smoking status: Never Smoker   . Smokeless tobacco: Never Used  . Alcohol Use: 0.0 oz/week    0 Standard drinks or equivalent per week     Comment: occasional  . Drug Use: No  . Sexual Activity: Not on file   Other Topics Concern  . 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 Amoxicillin-Pot Clavulanate Diarrhea  . Ceftin [Cefuroxime Axetil] Other (See Comments)    Severe yeast infection  . Clarithromycin Other (See Comments)    Significant metallic taste  . Nsaids Nausea Only and Other (See Comments)    Abdominal pain / severe reflux     Family History  Problem Relation Age of Onset  . Colon polyps Brother     elev TG  . Alcohol abuse Other   . Breast cancer Maternal Aunt   . Diabetes Father   . Hyperlipidemia Father   . Heart disease Father   . Stroke Maternal Grandmother   . Diabetes Maternal Grandmother   . Heart disease Maternal Grandmother   . Diabetes Maternal Grandfather   . Hyperlipidemia Brother     elevated triglycerides  . Stroke Father   . Liver disease Maternal Uncle     alcohol related      Review of Systems: No headache, visual changes, nausea, vomiting, diarrhea, constipation, dizziness, abdominal pain, skin rash, fevers, chills,  night sweats, weight loss, swollen lymph nodes, body aches, joint swelling, muscle aches, chest pain, shortness of breath, mood changes.   Objective Blood pressure 120/72, pulse 81, height 5' 3.5" (1.613 m), weight 122 lb (55.339 kg), last menstrual period 11/28/2014, SpO2 98 %.  General: No apparent distress alert and oriented x3 mood and affect normal, dressed appropriately.  HEENT: Pupils equal, extraocular movements intact  Respiratory: Patient's speak in full sentences and does not appear short of breath  Cardiovascular: No lower extremity edema, non tender, no erythema  Skin: Warm dry intact with no signs of infection or rash on extremities or on axial skeleton.  Abdomen: Soft nontender  Neuro: Cranial nerves II through XII are intact, neurovascularly intact in all  extremities with 2+ DTRs and 2+ pulses.  Lymph: No lymphadenopathy of posterior or anterior cervical chain or axillae bilaterally.  Gait normal with good balance and coordination.  MSK:  Non tender with full range of motion and good stability and symmetric strength and tone of , elbows, wrist, hip, knee and ankles bilaterally.  Shoulder:right Inspection reveals no abnormalities, atrophy or asymmetry. Palpation is normal with no tenderness over AC joint or bicipital groove. ROM is full in all planes. Mild tightness from  Rotator cuff strength normal throughout. Negative impingement Speeds and Yergason's tests normal. Negative O'Brien's Normal scapular function observed. No painful arc and no drop arm sign. No apprehension sign    Neck: Inspection unremarkable. No palpable stepoff mild radicular symptoms on the left side With Spurling's ontinues to have a small muscle spasm on the left side for worsening muscle spasm on the right side. No sensory change to C4 to T1 Negative Hoffman sign bilaterally Reflexes normal No crepitus noted.  Back Exam:  Inspection: Unremarkable  Motion: Flexion 45 deg improved, Extension 35 deg, Side Bending to 35 deg bilaterally,  Rotation to 45 deg bilaterally  SLR laying: Negative  XSLR laying: Negative  FABER: negative. Sensory change: Gross sensation intact to all lumbar and sacral dermatomes.  Reflexes: 2+ at both patellar tendons, 2+ at achilles tendons, Babinski's downgoing.  Strength at foot  Plantar-flexion: 5/5 Dorsi-flexion: 5/5 Eversion: 5/5 Inversion: 5/5  Leg strength  Quad: 5/5 Hamstring: 5/5 Hip flexor: 5/5 Hip abductors: 4/5  Gait unremarkable.  lower back seems to be stable.   OMT Physical Exam Cervical  C2 flexed rotated and side bent left C4 flexed rotated and side bent right   C6 flexed rotated and side bent left  Thoracic T1 extended rotated and side bent left with elevated first ribsignificant muscle spasm in the  trapezius present again.  T2 extended rotated and side bent right  T4 extended rotated and side bent left   Lumbar L2 flexed rotated inside that right  Sacrum Left on left       Impression and Recommendations:     This case required medical decision making of moderate complexity.

## 2015-11-07 MED FILL — AMOXICILLIN 500 MG CAPSULE: 500 | 7 days supply | Qty: 25 | Fill #0

## 2015-11-07 MED FILL — CHLORHEXIDINE 0.12% RINSE: 0.12 | 15 days supply | Qty: 473 | Fill #0

## 2015-11-07 MED FILL — SUPREP BOWEL PREP KIT: 17.5-3.13-1 | 1 days supply | Qty: 354 | Fill #0

## 2015-11-16 ENCOUNTER — Encounter: Payer: 59 | Admitting: Gastroenterology

## 2015-11-16 ENCOUNTER — Encounter: Payer: Self-pay | Admitting: Gastroenterology

## 2015-11-16 ENCOUNTER — Ambulatory Visit (AMBULATORY_SURGERY_CENTER): Payer: 59 | Admitting: Gastroenterology

## 2015-11-16 VITALS — BP 111/76 | HR 56 | Temp 98.4°F | Resp 9 | Ht 63.0 in | Wt 126.0 lb

## 2015-11-16 DIAGNOSIS — K921 Melena: Secondary | ICD-10-CM

## 2015-11-16 MED ORDER — SODIUM CHLORIDE 0.9 % IV SOLN: 500.0000 mL | INTRAVENOUS | Status: DC

## 2015-11-16 NOTE — Patient Instructions (Signed)
YOU HAD AN ENDOSCOPIC PROCEDURE TODAY AT THE [facility]:   Refer to the procedure report that was given to you for any specific questions about what was found during the examination.  If the procedure report does not answer your questions, please call your gastroenterologist to clarify.  If you requested that your care partner not be given the details of your procedure findings, then the procedure report has been included in a sealed envelope for you to review at your convenience later.  YOU SHOULD EXPECT: Some feelings of bloating in the abdomen. Passage of more gas than usual.  Walking can help get rid of the air that was put into your GI tract during the procedure and reduce the bloating. If you had a lower endoscopy (such as a colonoscopy or flexible sigmoidoscopy) you may notice spotting of blood in your stool or on the toilet paper. If you underwent a bowel prep for your procedure, you may not have a normal bowel movement for a few days.  Please Note:  You might notice some irritation and congestion in your nose or some drainage.  This is from the oxygen used during your procedure.  There is no need for concern and it should clear up in a day or so.  SYMPTOMS TO REPORT IMMEDIATELY:   Following lower endoscopy (colonoscopy or flexible sigmoidoscopy):  Excessive amounts of blood in the stool  Significant tenderness or worsening of abdominal pains  Swelling of the abdomen that is new, acute  Fever of 100F or higher   For urgent or emergent issues, a gastroenterologist can be reached at any hour by calling [phone].   DIET: Your first meal following the procedure should be a small meal and then it is ok to progress to your normal diet. Heavy or fried foods are harder to digest and may make you feel nauseous or bloated.  Likewise, meals heavy in dairy and vegetables can increase bloating.  Drink plenty of fluids but you should avoid alcoholic beverages for 24 hours.  Try to  increase the fiber in your diet.  Be sure to drink plenty of water.  ACTIVITY:  You should plan to take it easy for the rest of today and you should NOT DRIVE or use heavy machinery until tomorrow (because of the sedation medicines used during the test).    FOLLOW UP: Our staff will call the number listed on your records the next business day following your procedure to check on you and address any questions or concerns that you may have regarding the information given to you following your procedure. If we do not reach you, we will leave a message.  However, if you are feeling well and you are not experiencing any problems, there is no need to return our call.  We will assume that you have returned to your regular daily activities without incident.  If any biopsies were taken you will be contacted by phone or by letter within the next 1-3 weeks.  Please call us at [phone] if you have not heard about the biopsies in 3 weeks.    SIGNATURES/CONFIDENTIALITY: You and/or your care partner have signed paperwork which will be entered into your electronic medical record.  These signatures attest to the fact that that the information above on your After Visit Summary has been reviewed and is understood.  Full responsibility of the confidentiality of this discharge information lies with you and/or your care-partner.  Read all of the handouts given to  you by your recovery room nurse.  Thank-you for choosing [place] for your healthcare needs today.

## 2015-11-16 NOTE — Op Note (Signed)
[facility] Patient Name: Valerie [name] Procedure Date: 11/16/2015 9:55 AM MRN: [id number] Endoscopist: Meryl Dare , MD Age: 50 Referring MD:  Date of Birth: 04/22/1966 Gender: Female Procedure:                Colonoscopy Indications:              Evaluation of unexplained GI bleeding, Hematochezia Medicines:                Monitored Anesthesia Care Procedure:                Pre-Anesthesia Assessment:                           - Prior to the procedure, a History and Physical                            was performed, and patient medications and                            allergies were reviewed. The patient's tolerance of                            previous anesthesia was also reviewed. The risks                            and benefits of the procedure and the sedation                            options and risks were discussed with the patient.                            All questions were answered, and informed consent                            was obtained. Prior Anticoagulants: The patient has                            taken no previous anticoagulant or antiplatelet                            agents. ASA Grade Assessment: II - A patient with                            mild systemic disease. After reviewing the risks                            and benefits, the patient was deemed in                            satisfactory condition to undergo the procedure.                           After obtaining informed consent, the colonoscope  was passed under direct vision. Throughout the                            procedure, the patient's blood pressure, pulse, and                            oxygen saturations were monitored continuously. The                            Model PCF-H190L [id number]) scope was introduced                            through the anus and advanced to the the cecum,                            identified by appendiceal orifice  and ileocecal                            valve. The colonoscopy was performed without                            difficulty. The patient tolerated the procedure                            well. The quality of the bowel preparation was                            good. The ileocecal valve, appendiceal orifice, and                            rectum were photographed. Scope In: 10:21:14 AM Scope Out: 10:32:50 AM Scope Withdrawal Time: 0 hours 9 minutes 38 seconds  Total Procedure Duration: 0 hours 11 minutes 36 seconds  Findings:      The digital rectal exam was normal.      The colon (entire examined portion) appeared normal.      The retroflexed view of the distal rectum and anal verge showed small       Grade I internal hemorrhoids. Complications:            No immediate complications. Estimated Blood Loss:     Estimated blood loss: none. Impression:               - The entire examined colon is normal.                           - Small Grade I internal hemorrhoids. Recommendation:           - Patient has a contact number available for                            emergencies. The signs and symptoms of potential                            delayed complications were discussed with the  patient. Return to normal activities tomorrow.                            Written discharge instructions were provided to the                            patient.                           - Resume previous diet.                           - Continue present medications.                           - Preparation H supp daily as needed.                           - Repeat colonoscopy in 10 years for screening                            purposes. Procedure Code(s):        --- Professional ---                           [phone], Colonoscopy, flexible; diagnostic, including                            collection of specimen(s) by brushing or washing,                            when performed  (separate procedure) CPT copyright 2016 American Medical Association. All rights reserved. Meryl DareMalcolm T Jakeia Carreras, MD 11/16/2015 10:38:46 AM This report has been signed electronically. Number of Addenda: 0 Referring MD:      Berniece AndreasWanda Panosh, MD

## 2015-11-17 ENCOUNTER — Telehealth: Payer: Self-pay | Admitting: *Deleted

## 2015-11-17 NOTE — Telephone Encounter (Signed)
  Follow up Call-  Call back number 11/16/2015  Post procedure Call Back phone  # [phone]  Permission to leave phone message Yes     Patient questions:  Do you have a fever, pain , or abdominal swelling? No. Pain Score  0 *  Have you tolerated food without any problems? Yes.    Have you been able to return to your normal activities? Yes.    Do you have any questions about your discharge instructions: Diet   No. Medications  No. Follow up visit  No.  Do you have questions or concerns about your Care? No.  Actions: * If pain score is 4 or above: No action needed, pain <4.

## 2015-11-29 DIAGNOSIS — J3081 Allergic rhinitis due to animal (cat) (dog) hair and dander: Secondary | ICD-10-CM | POA: Diagnosis not present

## 2015-11-29 DIAGNOSIS — J301 Allergic rhinitis due to pollen: Secondary | ICD-10-CM | POA: Diagnosis not present

## 2015-11-29 DIAGNOSIS — H1045 Other chronic allergic conjunctivitis: Secondary | ICD-10-CM | POA: Diagnosis not present

## 2015-11-29 DIAGNOSIS — J3089 Other allergic rhinitis: Secondary | ICD-10-CM | POA: Diagnosis not present

## 2015-11-30 MED FILL — LEVOCETIRIZINE 5 MG TABLET: 5 | 90 days supply | Qty: 90 | Fill #0

## 2015-11-30 MED FILL — FLUTICASONE PROP 50 MCG SPR: 50 | 30 days supply | Qty: 16 | Fill #0

## 2015-11-30 MED FILL — AZELASTINE HCL 0.05% DROPS: 0.05 | 20 days supply | Qty: 6 | Fill #0

## 2015-12-07 DIAGNOSIS — J301 Allergic rhinitis due to pollen: Secondary | ICD-10-CM | POA: Diagnosis not present

## 2015-12-07 DIAGNOSIS — J3081 Allergic rhinitis due to animal (cat) (dog) hair and dander: Secondary | ICD-10-CM | POA: Diagnosis not present

## 2015-12-07 DIAGNOSIS — J3089 Other allergic rhinitis: Secondary | ICD-10-CM | POA: Diagnosis not present

## 2015-12-11 ENCOUNTER — Encounter: Payer: Self-pay | Admitting: Internal Medicine

## 2015-12-12 ENCOUNTER — Ambulatory Visit (HOSPITAL_COMMUNITY)
Admission: EM | Admit: 2015-12-12 | Discharge: 2015-12-12 | Disposition: A | Discharge status: Home / Self Care | Payer: 59 | Attending: Family Medicine | Admitting: Family Medicine

## 2015-12-12 ENCOUNTER — Encounter (HOSPITAL_COMMUNITY): Payer: Self-pay | Admitting: Emergency Medicine

## 2015-12-12 ENCOUNTER — Encounter: Payer: Self-pay | Admitting: Family Medicine

## 2015-12-12 DIAGNOSIS — B029 Zoster without complications: Secondary | ICD-10-CM | POA: Diagnosis not present

## 2015-12-12 MED ORDER — VALACYCLOVIR HCL 1 G PO TABS: 1000.0000 mg | ORAL_TABLET | Freq: Three times a day (TID) | ORAL | Status: DC

## 2015-12-12 NOTE — ED Notes (Signed)
[facility] a [facility] rash on her right buttock since yesterday.

## 2015-12-12 NOTE — ED Provider Notes (Signed)
CSN: [id number]     Arrival date & time 12/12/15  1812 History   First MD Initiated Contact with Patient 12/12/15 1930     Chief Complaint  Patient presents with  . Rash   (Consider location/radiation/quality/duration/timing/severity/associated sxs/prior Treatment) HPI History obtained from patient: Location: Right buttock  Context/Duration: Sudden onset yesterday,   Severity: 2  Quality: Timing:      constant      Home Treatment: none Associated symptoms:  Burning pain with rash  Past Medical History  Diagnosis Date  . Anemia     NOS  . GERD (gastroesophageal reflux disease)   . HSV infection     labialis  . 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 Allergy     cats  . RECTAL BLEEDING 07/10/2007    Qualifier: Diagnosis of  By: [name], Neta MendsWanda K 2008 colonoscopy [facility].    Past Surgical History  Procedure Laterality Date  . Lasik  2009    mono vision  . Dilitation & currettage/hystroscopy with versapoint resection  08/27/2012    Procedure: DILATATION & CURETTAGE/HYSTEROSCOPY WITH VERSAPOINT RESECTION;  Surgeon: Genia DelMarie-Lyne Lavoie, MD;  Location: [facility];  Service: Gynecology;;   Family History  Problem Relation Age of Onset  . Colon polyps Brother     elev TG  . Alcohol abuse Other   . Breast cancer Maternal Aunt   . Diabetes Father   . Hyperlipidemia Father   . Heart disease Father   . Stroke Father   . Stroke Maternal Grandmother   . Diabetes Maternal Grandmother   . Heart disease Maternal Grandmother   . Diabetes Maternal Grandfather   . Hyperlipidemia Brother     elevated triglycerides  . Liver disease Maternal Uncle     alcohol related  . Colon cancer Neg Hx    Social History  Substance Use Topics  . Smoking status: Never Smoker   . Smokeless tobacco: Never Used  . Alcohol Use: 0.0 oz/week    0 Standard drinks or equivalent per week     Comment: occasional   OB History    No data available     Review of Systems ROS +'ve right buttock  rash  Denies: HEADACHE, NAUSEA, ABDOMINAL PAIN, CHEST PAIN, CONGESTION, DYSURIA, SHORTNESS OF BREATH  Allergies  Augmentin; Ceftin; Clarithromycin; and Nsaids  Home Medications   Prior to Admission medications   Medication Sig Start Date End Date Taking? Authorizing Provider  cetirizine (ZYRTEC) 10 MG tablet Take 10 mg by mouth daily as needed for allergies.   Yes Historical Provider, MD  cholecalciferol (VITAMIN D) 1000 UNITS tablet Take 1,000 Units by mouth daily.   Yes Historical Provider, MD  gabapentin (NEURONTIN) 300 MG capsule Take 1 capsule (300 mg total) by mouth at bedtime. 09/28/15  Yes Judi SaaZachary M Smith, DO  Multiple Vitamin (MULTIVITAMIN WITH MINERALS) TABS Take 1 tablet by mouth daily.   Yes Historical Provider, MD  TURMERIC PO Take by mouth.   Yes Historical Provider, MD  EPINEPHrine 0.3 mg/0.3 mL IJ SOAJ injection as needed. Reported on 11/16/2015 10/19/15   Historical Provider, MD  methocarbamol (ROBAXIN) 500 MG tablet Take 1 tablet (500 mg total) by mouth 3 (three) times daily. 09/21/15   Judi SaaZachary M Smith, DO  Methylsulfonylmethane (MSM) 1000 MG CAPS Take 1 capsule by mouth daily.     Historical Provider, MD  Omega-3 Fatty Acids (FISH OIL) 1200 MG CAPS Take 1,200 mg by mouth daily.    Historical  Provider, MD  pantoprazole (PROTONIX) 40 MG tablet Take 1 tablet (40 mg total) by mouth daily. Patient not taking: Reported on 11/16/2015 08/31/15   Meryl Dare, MD  valACYclovir (VALTREX) 1000 MG tablet Take 1,000 mg by mouth 2 (two) times daily.    Historical Provider, MD  valACYclovir (VALTREX) 1000 MG tablet Take 1 tablet (1,000 mg total) by mouth 3 (three) times daily. 12/12/15   Tharon Aquas, PA   Meds Ordered and Administered this Visit  Medications - No data to display  BP 125/88 mmHg  Pulse 72  Temp(Src) 98.3 F (36.8 C) (Oral)  Resp 18  SpO2 100%  LMP 11/28/2014 No data found.   Physical Exam  ED Course  Procedures (including critical care time)  Labs  Review Labs Reviewed - No data to display  Imaging Review No results found.   Visual Acuity Review  Right Eye Distance:   Left Eye Distance:   Bilateral Distance:    Right Eye Near:   Left Eye Near:    Bilateral Near:     PRESCRIPTION:valtrex # 14  SUGGEST CONTINUED SYMPTOMATIC TREATMENT AT HOME.     MDM   1. Herpes zoster     Patient is reassured that there are no issues that require transfer to higher level of care at this time or additional tests. Patient is advised to continue home symptomatic treatment. Patient is advised that if there are new or worsening symptoms to attend the emergency department, contact primary care provider, or return to UC. Instructions of care provided discharged home in stable condition.    THIS NOTE WAS GENERATED USING A VOICE RECOGNITION SOFTWARE PROGRAM. ALL REASONABLE EFFORTS  WERE MADE TO PROOFREAD THIS DOCUMENT FOR ACCURACY.  I have verbally reviewed the discharge instructions with the patient. A printed AVS was given to the patient.  All questions were answered prior to discharge.      Tharon Aquas, PA 12/12/15 1958

## 2015-12-12 NOTE — Discharge Instructions (Signed)
Shingles Shingles is an infection that causes a painful skin rash and fluid-filled blisters. Shingles is caused by the same virus that causes chickenpox. Shingles only develops in people who:  Have had chickenpox.  Have gotten the chickenpox vaccine. (This is rare.) The first symptoms of shingles may be itching, tingling, or pain in an area on your skin. A rash will follow in a few days or weeks. The rash is usually on one side of the body in a bandlike or beltlike pattern. Over time, the rash turns into fluid-filled blisters that break open, scab over, and dry up. Medicines may:  Help you manage pain.  Help you recover more quickly.  Help to prevent long-term problems. HOME CARE Medicines  Take medicines only as told by your doctor.  Apply an anti-itch or numbing cream to the affected area as told by your doctor. Blister and Rash Care  Take a cool bath or put cool compresses on the area of the rash or blisters as told by your doctor. This may help with pain and itching.  Keep your rash covered with a loose bandage (dressing). Wear loose-fitting clothing.  Keep your rash and blisters clean with mild soap and cool water or as told by your doctor.  Check your rash every day for signs of infection. These include redness, swelling, and pain that lasts or gets worse.  Do not pick your blisters.  Do not scratch your rash. General Instructions  Rest as told by your doctor.  Keep all follow-up visits as told by your doctor. This is important.  Until your blisters scab over, your infection can cause chickenpox in people who have never had it or been vaccinated against it. To prevent this from happening, avoid touching other people or being around other people, especially:  Babies.  Pregnant women.  Children who have eczema.  Elderly people who have transplants.  People who have chronic illnesses, such as leukemia or AIDS. GET HELP IF:  Your pain does not get better with  medicine.  Your pain does not get better after the rash heals.  Your rash looks infected. Signs of infection include:  Redness.  Swelling.  Pain that lasts or gets worse. GET HELP RIGHT AWAY IF:  The rash is on your face or nose.  You have pain in your face, pain around your eye area, or loss of feeling on one side of your face.  You have ear pain or you have ringing in your ear.  You have loss of taste.  Your condition gets worse.   This information is not intended to replace advice given to you by your health care provider. Make sure you discuss any questions you have with your health care provider.   Document Released: 01/22/2008 Document Revised: 08/26/2014 Document Reviewed: 05/17/2014 Elsevier Interactive Patient Education 2016 Elsevier Inc.  

## 2015-12-13 ENCOUNTER — Ambulatory Visit: Payer: 59 | Admitting: Family Medicine

## 2015-12-13 MED FILL — valACYclovir HCL 1 GM TABS: 1 | 7 days supply | Qty: 21 | Fill #0

## 2015-12-13 NOTE — Telephone Encounter (Signed)
Apologies that I didn't view this till today .  See that you have shingles . LEt us know if you need a follow up visit for this.

## 2015-12-14 ENCOUNTER — Ambulatory Visit: Payer: 59 | Admitting: Family Medicine

## 2015-12-14 MED FILL — GABAPENTIN 100 MG CAPSULE: 100 | 30 days supply | Qty: 60 | Fill #1

## 2015-12-21 ENCOUNTER — Encounter: Payer: Self-pay | Admitting: Internal Medicine

## 2015-12-21 ENCOUNTER — Ambulatory Visit (INDEPENDENT_AMBULATORY_CARE_PROVIDER_SITE_OTHER): Payer: 59 | Admitting: Internal Medicine

## 2015-12-21 ENCOUNTER — Telehealth: Payer: Self-pay | Admitting: Internal Medicine

## 2015-12-21 VITALS — BP 132/80 | Temp 98.0°F | Wt 125.2 lb

## 2015-12-21 DIAGNOSIS — R21 Rash and other nonspecific skin eruption: Secondary | ICD-10-CM | POA: Diagnosis not present

## 2015-12-21 DIAGNOSIS — B0089 Other herpesviral infection: Secondary | ICD-10-CM

## 2015-12-21 DIAGNOSIS — J301 Allergic rhinitis due to pollen: Secondary | ICD-10-CM | POA: Diagnosis not present

## 2015-12-21 DIAGNOSIS — J3081 Allergic rhinitis due to animal (cat) (dog) hair and dander: Secondary | ICD-10-CM | POA: Diagnosis not present

## 2015-12-21 DIAGNOSIS — J3089 Other allergic rhinitis: Secondary | ICD-10-CM | POA: Diagnosis not present

## 2015-12-21 NOTE — Patient Instructions (Signed)
If recurs  . I want to see the rash   Does seem herpetic or zoster  I think you are doing well.   Plan cpx with lab in June  Will include  a cbcdiff

## 2015-12-21 NOTE — Telephone Encounter (Signed)
Pt want a order for her cpx labs sent to [facility] .Cpx is on 02/12/16

## 2015-12-21 NOTE — Progress Notes (Signed)
Pre visit review using our clinic review tool, if applicable. No additional management support is needed unless otherwise documented below in the visit note.  Chief Complaint  Patient presents with  . Follow-up    HPI: Valerie [name] 50 y.o.  Seen in ed 4 25 for rash dx as shingles out of work  And then taking gabapentin also  Some peeling  And back to work   Monday .    Not long   Slept first few days .   More itchy than painful.  No bwel or bladder changing   On buttocks   Right lasted week or less has hsv cold sore  And rx  At home . Inc gabapentin now rash is flaky was told tofu ? If need for other fu . No hx of immunosuppression sti gu sx  . No sig pain at this time . Still under rx for ms causes neck trap spasm  Not related  Has some   Nasal ithcing in allergy season no rash  ROS: See pertinent positives and negatives per HPI.  Past Medical History  Diagnosis Date  . Anemia     NOS  . GERD (gastroesophageal reflux disease)   . HSV infection     labialis  . 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 Allergy     cats  . RECTAL BLEEDING 07/10/2007    Qualifier: Diagnosis of  By: Fabian Sharp MD, Neta Mends 2008 colonoscopy [facility].     Family History  Problem Relation Age of Onset  . Colon polyps Brother     elev TG  . Alcohol abuse Other   . Breast cancer Maternal Aunt   . Diabetes Father   . Hyperlipidemia Father   . Heart disease Father   . Stroke Father   . Stroke Maternal Grandmother   . Diabetes Maternal Grandmother   . Heart disease Maternal Grandmother   . Diabetes Maternal Grandfather   . Hyperlipidemia Brother     elevated triglycerides  . Liver disease Maternal Uncle     alcohol related  . Colon cancer Neg Hx     Social History   Social History  . Marital Status: Married    Spouse Name: N/A  . Number of Children: N/A  . Years of Education: N/A   Occupational History  . 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 Smoking  status: Never Smoker   . Smokeless tobacco: Never Used  . Alcohol Use: 0.0 oz/week    0 Standard drinks or equivalent per week     Comment: occasional  . Drug Use: No  . Sexual Activity: Not Asked   Other Topics Concern  . 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 cetirizine (ZYRTEC) 10 MG tablet Take 10 mg by mouth daily as needed for allergies.    . cholecalciferol (VITAMIN D) 1000 UNITS tablet Take 1,000 Units by mouth daily.    Marland Kitchen EPINEPHrine 0.3 mg/0.3 mL IJ SOAJ injection as needed. Reported on 11/16/2015  1  . methocarbamol (ROBAXIN) 500 MG tablet Take 1 tablet (500 mg total) by mouth 3 (three) times  daily. 90 tablet 0  . Methylsulfonylmethane (MSM) 1000 MG CAPS Take 1 capsule by mouth daily.     . Multiple Vitamin (MULTIVITAMIN WITH MINERALS) TABS Take 1 tablet by mouth daily.    . Omega-3 Fatty Acids (FISH OIL) 1200 MG CAPS Take 1,200 mg by mouth daily.    . TURMERIC PO Take by mouth.    . valACYclovir (VALTREX) 1000 MG tablet Take 1 tablet (1,000 mg total) by mouth 3 (three) times daily. 21 tablet 2  . gabapentin (NEURONTIN) 300 MG capsule Take 1 capsule (300 mg total) by mouth at bedtime. 90 capsule 1  . pantoprazole (PROTONIX) 40 MG tablet Take 1 tablet (40 mg total) by mouth daily. (Patient not taking: Reported on 11/16/2015) 30 tablet 11  . valACYclovir (VALTREX) 1000 MG tablet Take 1,000 mg by mouth 2 (two) times daily.     No facility-administered medications prior to visit.     EXAM:  BP 132/80 mmHg  Temp(Src) 98 F (36.7 C) (Oral)  Wt 125 lb 3.2 oz (56.79 kg)  LMP 11/28/2014  Body mass index is 22.18 kg/(m^2).  GENERAL: vitals reviewed and listed above, alert, oriented, appears well hydrated and in no acute distress HEENT: atraumatic,  conjunctiva  clear, no obvious abnormalities on inspection of external nose and ears MS: moves all extremities without noticeable focal  Abnormality veryt tight left trap  Skin right buttocks sacaral derm with faded rash resolvd  Px shoed more disrete lesions  Hard to tell if vesicles  PSYCH: pleasant and cooperative, no obvious depression or anxiety Lab Results  Component Value Date   WBC 3.7* 06/28/2015   HGB 12.9 06/28/2015   HCT 37.5 06/28/2015   PLT 201 06/28/2015   GLUCOSE 91 06/28/2015   CHOL 197 11/29/2014   TRIG 47.0 11/29/2014   HDL 69.60 11/29/2014   LDLCALC 118* 11/29/2014   ALT 20 06/28/2015   AST 21 06/28/2015   NA 137 06/28/2015   K 3.7 06/28/2015   CL 105 06/28/2015   CREATININE 0.64 06/28/2015   BUN 13 06/28/2015   CO2 25 06/28/2015   TSH 1.76 11/29/2014    ASSESSMENT AND PLAN:  Discussed the following assessment and plan:  No diagnosis found. Rash resolved presumed shingles but no sig pain  And no hx of same consider hsv ir recurrent disc with pt  She has  Valtrex rx  Not high risk for immunosuppression and I do not think she is but hx of context     Following borderline wbc Get cps with full labs in June  declined hiv aby feels not at risk    -Patient advised to return or notify health care team  if symptoms worsen ,persist or new concerns arise.  Patient Instructions  If recurs  . I want to see the rash   Does seem herpetic or zoster  I think you are doing well.   Plan cpx with lab in June  Will include  a cbcdiff        Neta MendsWanda K. Cherelle Midkiff M.D.

## 2015-12-22 ENCOUNTER — Other Ambulatory Visit: Payer: Self-pay | Admitting: Family Medicine

## 2015-12-22 DIAGNOSIS — Z0000 Encounter for general adult medical examination without abnormal findings: Secondary | ICD-10-CM

## 2015-12-22 NOTE — Telephone Encounter (Signed)
Lab work ordered for [facility].

## 2015-12-25 ENCOUNTER — Encounter: Payer: Self-pay | Admitting: Family Medicine

## 2015-12-27 ENCOUNTER — Encounter: Payer: Self-pay | Admitting: Family Medicine

## 2015-12-27 ENCOUNTER — Ambulatory Visit (INDEPENDENT_AMBULATORY_CARE_PROVIDER_SITE_OTHER): Payer: 59 | Admitting: Family Medicine

## 2015-12-27 VITALS — BP 118/76 | HR 76 | Wt 125.0 lb

## 2015-12-27 DIAGNOSIS — M9903 Segmental and somatic dysfunction of lumbar region: Secondary | ICD-10-CM | POA: Diagnosis not present

## 2015-12-27 DIAGNOSIS — M9902 Segmental and somatic dysfunction of thoracic region: Secondary | ICD-10-CM | POA: Diagnosis not present

## 2015-12-27 DIAGNOSIS — M25512 Pain in left shoulder: Secondary | ICD-10-CM

## 2015-12-27 DIAGNOSIS — M9901 Segmental and somatic dysfunction of cervical region: Secondary | ICD-10-CM

## 2015-12-27 DIAGNOSIS — M999 Biomechanical lesion, unspecified: Secondary | ICD-10-CM

## 2015-12-27 DIAGNOSIS — M542 Cervicalgia: Secondary | ICD-10-CM

## 2015-12-27 MED ORDER — HYDROXYZINE HCL 10 MG PO TABS: 10.0000 mg | ORAL_TABLET | Freq: Three times a day (TID) | ORAL | Status: DC | PRN

## 2015-12-27 MED FILL — hydrOXYzine HCL 10 MG TABS: 10 | 30 days supply | Qty: 90 | Fill #0

## 2015-12-27 NOTE — Assessment & Plan Note (Signed)
Prescription for hydroxyzine given today.

## 2015-12-27 NOTE — Assessment & Plan Note (Signed)
Patient given injection again today. Tolerated procedure well. We discussed icing regimen and home exercises. Discussed which activities to do in which ones to avoid. Follow-up again in 3-4 weeks. Has been sent to formal physical therapy for dry needling.

## 2015-12-27 NOTE — Patient Instructions (Signed)
Good to see you  We will get you back to physical therapy and they will try the dry needling Did the injections today  You know how much I like ice.  Continue the gabapentin  Try hydroxyzine up to 3 times a day  See me again in 3-4 weeks.

## 2015-12-27 NOTE — Assessment & Plan Note (Signed)
Continues to him being neck pain and spasm. Patient has had an further workup that did not show any significant impingement noted. Does have muscle relaxer for breakthrough pain. Response fairly well to osteopathic manipulation. Given trigger point injections as well. Sent to formal physical therapy for further intervention and I do think that dry needling will be beneficial. Patient and will come back and see me again in 3-4 weeks for further evaluation and treatment.

## 2015-12-27 NOTE — Assessment & Plan Note (Signed)
Decision today to treat with OMT was based on Physical Exam  After verbal consent patient was treated with HVLA, ME, FPR techniques in cervical, rib, thoracic, lumbar and sacral areas more muscle energy within the cervical spine.  Patient tolerated the procedure well with improvement in symptoms  Patient given exercises, stretches and lifestyle modifications  See medications in patient instructions if given  Patient will follow up in 3--4 weeks

## 2015-12-27 NOTE — Progress Notes (Signed)
Tawana ScaleZach Jorgina Binning D.O. [facility] [address] [place], [place] [address] Phone: [phone] Subjective:    CC: Neck and back pain follow up  ONG:[id number]:Subjective Valerie [name] is a 50 y.o. female coming in with complaint of neck and back pain. Patient was doing somewhat better at last exam but today is having worsening symptoms. Has been quite a while since we seen her. Having more pain in the left trapezius again. Has responded well to osteopathic manipulation as well as trigger point injections previously. Feels that this is very similar. Denies any significant radiation down the arm but sometimes has a mild tingling. This is not constant. Denies weakness.     Past Medical History  Diagnosis Date  . Anemia     NOS  . GERD (gastroesophageal reflux disease)   . HSV infection     labialis  . 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 Allergy     cats  . RECTAL BLEEDING 07/10/2007    Qualifier: Diagnosis of  By: [name], Neta MendsWanda K 2008 colonoscopy [facility].    Past Surgical History  Procedure Laterality Date  . Lasik  2009    mono vision  . Dilitation & currettage/hystroscopy with versapoint resection  08/27/2012    Procedure: DILATATION & CURETTAGE/HYSTEROSCOPY WITH VERSAPOINT RESECTION;  Surgeon: Genia DelMarie-Lyne Lavoie, MD;  Location: [facility];  Service: Gynecology;;   Social History   Social History  . Marital Status: Married    Spouse Name: N/A  . Number of Children: N/A  . 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 Smoking status: Never Smoker   . Smokeless tobacco: Never Used  . Alcohol Use: 0.0 oz/week    0 Standard drinks or equivalent per week     Comment: occasional  . Drug Use: No  . Sexual Activity: Not on file   Other Topics Concern  . 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 Augmentin [Amoxicillin-Pot Clavulanate] Diarrhea  . Ceftin [Cefuroxime Axetil] Other (See Comments)    Severe yeast infection  . Clarithromycin Other (See Comments)    Significant metallic taste  . Nsaids Nausea Only and Other (See Comments)    Abdominal pain / severe reflux     Family History  Problem Relation Age of Onset  . Colon polyps Brother     elev TG  . Alcohol abuse Other   . Breast cancer Maternal Aunt   . Diabetes Father   . Hyperlipidemia Father   . Heart disease Father   . Stroke Father   . Stroke Maternal Grandmother   . Diabetes Maternal Grandmother   . Heart disease Maternal Grandmother   . Diabetes Maternal Grandfather   . Hyperlipidemia Brother     elevated triglycerides  . Liver disease Maternal Uncle     alcohol related  . Colon cancer Neg Hx       Review of Systems: No headache, visual changes, nausea, vomiting, diarrhea, constipation, dizziness, abdominal pain, skin rash, fevers, chills, night sweats, weight loss, swollen lymph nodes, body aches, joint swelling, muscle aches, chest pain, shortness of breath,  mood changes.   Objective Blood pressure 118/76, pulse 76, weight 125 lb (56.7 kg), last menstrual period 11/28/2014.  General: No apparent distress alert and oriented x3 mood and affect normal, dressed appropriately.  HEENT: Pupils equal, extraocular movements intact  Respiratory: Patient's speak in full sentences and does not appear short of breath  Cardiovascular: No lower extremity edema, non tender, no erythema  Skin: Warm dry intact with no signs of infection or rash on extremities or on axial skeleton.  Abdomen: Soft nontender  Neuro: Cranial nerves II through XII are intact, neurovascularly intact in all extremities with 2+ DTRs and 2+ pulses.  Lymph: No lymphadenopathy of posterior or anterior cervical chain or axillae bilaterally.  Gait normal with good  balance and coordination.  MSK:  Non tender with full range of motion and good stability and symmetric strength and tone of Shoulder, elbows, wrist, hip, knee and ankles bilaterally.     Neck: Inspection unremarkable. No palpable stepoff Mild positive Spurling's. Patient is also having significant tightness of the left trapezius. Trigger points noted. No sensory change to C4 to T1 Negative Hoffman sign bilaterally Reflexes normal No crepitus noted.  Back Exam:  Inspection: Unremarkable  Motion: Flexion 45 deg improved, Extension 35 deg, Side Bending to 35 deg bilaterally,  Rotation to 45 deg bilaterally  SLR laying: Negative  XSLR laying: Negative  FABER: negative. Sensory change: Gross sensation intact to all lumbar and sacral dermatomes.  Reflexes: 2+ at both patellar tendons, 2+ at achilles tendons, Babinski's downgoing.  Strength at foot  Plantar-flexion: 5/5 Dorsi-flexion: 5/5 Eversion: 5/5 Inversion: 5/5  Leg strength  Quad: 5/5 Hamstring: 5/5 Hip flexor: 5/5 Hip abductors: 4/5  Gait unremarkable.   OMT Physical Exam Cervical  C2 flexed rotated and side bent left C4 flexed rotated and side bent right   C6 flexed rotated and side bent left  Thoracic T1 extended rotated and side bent left with elevated first ribsignificant muscle spasm in the trapezius present again trigger points T2 extended rotated and side bent right  T4 extended rotated and side bent left   Lumbar L2 flexed rotated inside that right  Sacrum Left on left   Procedure note After verbal consent patient was prepped with call swabs and with a 25-gauge half-inch needle was injected in 3 different trigger points in the left trapezius muscle. Total of 3 mL of 0.5% Marcaine and 1 mL of Kenalog 40 mg/dL.    Impression and Recommendations:     This case required medical decision making of moderate complexity.

## 2016-01-02 DIAGNOSIS — J3089 Other allergic rhinitis: Secondary | ICD-10-CM | POA: Diagnosis not present

## 2016-01-02 DIAGNOSIS — J301 Allergic rhinitis due to pollen: Secondary | ICD-10-CM | POA: Diagnosis not present

## 2016-01-02 DIAGNOSIS — J3081 Allergic rhinitis due to animal (cat) (dog) hair and dander: Secondary | ICD-10-CM | POA: Diagnosis not present

## 2016-01-10 DIAGNOSIS — J3089 Other allergic rhinitis: Secondary | ICD-10-CM | POA: Diagnosis not present

## 2016-01-10 DIAGNOSIS — J301 Allergic rhinitis due to pollen: Secondary | ICD-10-CM | POA: Diagnosis not present

## 2016-01-18 DIAGNOSIS — J3081 Allergic rhinitis due to animal (cat) (dog) hair and dander: Secondary | ICD-10-CM | POA: Diagnosis not present

## 2016-01-18 DIAGNOSIS — J301 Allergic rhinitis due to pollen: Secondary | ICD-10-CM | POA: Diagnosis not present

## 2016-01-18 DIAGNOSIS — J3089 Other allergic rhinitis: Secondary | ICD-10-CM | POA: Diagnosis not present

## 2016-01-22 DIAGNOSIS — H2513 Age-related nuclear cataract, bilateral: Secondary | ICD-10-CM | POA: Diagnosis not present

## 2016-01-24 ENCOUNTER — Ambulatory Visit (INDEPENDENT_AMBULATORY_CARE_PROVIDER_SITE_OTHER): Payer: 59 | Admitting: Family Medicine

## 2016-01-24 ENCOUNTER — Encounter: Payer: Self-pay | Admitting: Family Medicine

## 2016-01-24 VITALS — BP 102/74 | HR 78 | Ht 63.5 in | Wt 123.0 lb

## 2016-01-24 DIAGNOSIS — M999 Biomechanical lesion, unspecified: Secondary | ICD-10-CM

## 2016-01-24 DIAGNOSIS — M542 Cervicalgia: Secondary | ICD-10-CM | POA: Diagnosis not present

## 2016-01-24 DIAGNOSIS — M9901 Segmental and somatic dysfunction of cervical region: Secondary | ICD-10-CM

## 2016-01-24 DIAGNOSIS — M9902 Segmental and somatic dysfunction of thoracic region: Secondary | ICD-10-CM

## 2016-01-24 DIAGNOSIS — M9908 Segmental and somatic dysfunction of rib cage: Secondary | ICD-10-CM

## 2016-01-24 DIAGNOSIS — M9903 Segmental and somatic dysfunction of lumbar region: Secondary | ICD-10-CM | POA: Diagnosis not present

## 2016-01-24 DIAGNOSIS — M9904 Segmental and somatic dysfunction of sacral region: Secondary | ICD-10-CM | POA: Diagnosis not present

## 2016-01-24 NOTE — Assessment & Plan Note (Signed)
Decision today to treat with OMT was based on Physical Exam  After verbal consent patient was treated with HVLA, ME, FPR techniques in cervical, rib, thoracic, lumbar and sacral areas more muscle energy within the cervical spine.  Patient tolerated the procedure well with improvement in symptoms  Patient given exercises, stretches and lifestyle modifications  See medications in patient instructions if given  Patient will follow up in 4 weeks

## 2016-01-24 NOTE — Patient Instructions (Signed)
Gd to see you  Valerie [name] is your friend  May need a muscle relaxer tonight See me again in 4ish weeks.

## 2016-01-24 NOTE — Progress Notes (Signed)
Pre visit review using our clinic review tool, if applicable. No additional management support is needed unless otherwise documented below in the visit note. 

## 2016-01-24 NOTE — Progress Notes (Signed)
[name] [address] [place], [place] [address] Phone: [phone] Subjective:    CC: Neck and back pain follow up  BJY:[id number] Valerie [name] is a 50 y.o. female coming in with complaint of neck and back pain. Patient seems to be doing somewhat better at this moment. Patient states that there is still some tightness of the trapezius is bilaterally left couldn't and right. No radiation down the arm. Maybe an ache from time to time. Patient states the neck can be stiff and does think it seems to be associated with stress. Back pain or lower back pain seems to be much better.      Past Medical History  Diagnosis Date  . Anemia     NOS  . GERD (gastroesophageal reflux disease)   . HSV infection     labialis  . 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 Allergy     cats  . RECTAL BLEEDING 07/10/2007    Qualifier: Diagnosis of  By: Fabian Sharp MD, Neta Mends 2008 colonoscopy [facility].    Past Surgical History  Procedure Laterality Date  . Lasik  2009    mono vision  . Dilitation & currettage/hystroscopy with versapoint resection  08/27/2012    Procedure: DILATATION & CURETTAGE/HYSTEROSCOPY WITH VERSAPOINT RESECTION;  Surgeon: Genia Del, MD;  Location: [facility];  Service: Gynecology;;   Social History   Social History  . Marital Status: Married    Spouse Name: N/A  . Number of Children: N/A  . 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 Smoking status: Never Smoker   . Smokeless tobacco: Never Used  . Alcohol Use: 0.0 oz/week    0 Standard drinks or equivalent per week     Comment: occasional  . Drug Use: No  . Sexual Activity: Not on file   Other Topics Concern  . 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 Augmentin [Amoxicillin-Pot Clavulanate] Diarrhea  . Ceftin [Cefuroxime Axetil] Other (See Comments)    Severe yeast infection  . Clarithromycin Other (See Comments)    Significant metallic taste  . Nsaids Nausea Only and Other (See Comments)    Abdominal pain / severe reflux     Family History  Problem Relation Age of Onset  . Colon polyps Brother     elev TG  . Alcohol abuse Other   . Breast cancer Maternal Aunt   . Diabetes Father   . Hyperlipidemia Father   . Heart disease Father   . Stroke Father   . Stroke Maternal Grandmother   . Diabetes Maternal Grandmother   . Heart disease Maternal Grandmother   . Diabetes Maternal Grandfather   . Hyperlipidemia Brother     elevated triglycerides  . Liver disease Maternal Uncle     alcohol related  . Colon cancer Neg Hx       Review of Systems: No headache, visual changes, nausea, vomiting, diarrhea, constipation, dizziness, abdominal pain, skin rash, fevers, chills, night sweats, weight loss, swollen lymph nodes, body aches, joint swelling, muscle aches, chest pain, shortness of breath, mood  changes.   Objective Blood pressure 102/74, pulse 78, height 5' 3.5" (1.613 m), weight 123 lb (55.792 kg), last menstrual period 11/28/2014, SpO2 99 %.  General: No apparent distress alert and oriented x3 mood and affect normal, dressed appropriately.  HEENT: Pupils equal, extraocular movements intact  Respiratory: Patient's speak in full sentences and does not appear short of breath  Cardiovascular: No lower extremity edema, non tender, no erythema  Skin: Warm dry intact with no signs of infection or rash on extremities or on axial skeleton.  Abdomen: Soft nontender  Neuro: Cranial nerves II through XII are intact, neurovascularly intact in all extremities with 2+ DTRs and 2+ pulses.  Lymph: No lymphadenopathy of posterior or anterior cervical chain or axillae bilaterally.  Gait normal with good  balance and coordination.  MSK:  Non tender with full range of motion and good stability and symmetric strength and tone of Shoulder, elbows, wrist, hip, knee and ankles bilaterally.     Neck: Inspection unremarkable. No palpable stepoff Continue mild positive Spurling's on the left side No sensory change to C4 to T1 Negative Hoffman sign bilaterally Reflexes normal No crepitus noted.  Back Exam:  Inspection: Unremarkable  Motion: Flexion 45 deg improved, Extension 35 deg, Side Bending to 35 deg bilaterally,  Rotation to 45 deg bilaterally  SLR laying: Negative  XSLR laying: Negative  FABER: negative. Sensory change: Gross sensation intact to all lumbar and sacral dermatomes.  Reflexes: 2+ at both patellar tendons, 2+ at achilles tendons, Babinski's downgoing.  Strength at foot  Plantar-flexion: 5/5 Dorsi-flexion: 5/5 Eversion: 5/5 Inversion: 5/5  Leg strength  Quad: 5/5 Hamstring: 5/5 Hip flexor: 5/5 Hip abductors: 4/5  Gait unremarkable.   OMT Physical Exam Cervical  C2 flexed rotated and side bent left  C6 flexed rotated and side bent left  Thoracic T1 extended rotated and side bent left with elevated first rib T2 extended rotated and side bent right  T7 extended rotated and side bent left  Lumbar L2 flexed rotated inside that right  Sacrum Left on left       Impression and Recommendations:     This case required medical decision making of moderate complexity.

## 2016-01-24 NOTE — Assessment & Plan Note (Signed)
Concerned with patient having some radicular symptoms are aching going down the left arm. We discussed icing regimen. Patient will continue moving ergonomics. Still think that advance imaging could be warranted if worsening symptoms. Patient has declined this multiple times. Does have muscle relaxers if necessary. Patient will come back and see me again in 4 weeks for further evaluation and treatment.

## 2016-01-25 DIAGNOSIS — J3081 Allergic rhinitis due to animal (cat) (dog) hair and dander: Secondary | ICD-10-CM | POA: Diagnosis not present

## 2016-01-25 DIAGNOSIS — J301 Allergic rhinitis due to pollen: Secondary | ICD-10-CM | POA: Diagnosis not present

## 2016-01-25 DIAGNOSIS — J3089 Other allergic rhinitis: Secondary | ICD-10-CM | POA: Diagnosis not present

## 2016-01-31 ENCOUNTER — Other Ambulatory Visit (INDEPENDENT_AMBULATORY_CARE_PROVIDER_SITE_OTHER): Payer: 59

## 2016-01-31 DIAGNOSIS — J301 Allergic rhinitis due to pollen: Secondary | ICD-10-CM | POA: Diagnosis not present

## 2016-01-31 DIAGNOSIS — Z0000 Encounter for general adult medical examination without abnormal findings: Secondary | ICD-10-CM

## 2016-01-31 DIAGNOSIS — J3081 Allergic rhinitis due to animal (cat) (dog) hair and dander: Secondary | ICD-10-CM | POA: Diagnosis not present

## 2016-01-31 DIAGNOSIS — J3089 Other allergic rhinitis: Secondary | ICD-10-CM | POA: Diagnosis not present

## 2016-01-31 LAB — LIPID PANEL
Cholesterol: 209 mg/dL — ABNORMAL HIGH (ref 0–200)
HDL: 78.7 mg/dL (ref >39.00)
LDL Cholesterol: 120 mg/dL — ABNORMAL HIGH (ref 0–99)
NonHDL: 130.58
Total CHOL/HDL Ratio: 3
Triglycerides: 53 mg/dL (ref 0.0–149.0)
VLDL: 10.6 mg/dL (ref 0.0–40.0)

## 2016-01-31 LAB — CBC WITH DIFFERENTIAL/PLATELET
Basophils Absolute: 0 10*3/uL (ref 0.0–0.1)
Basophils Relative: 0.5 % (ref 0.0–3.0)
EOS PCT: 1 % (ref 0.0–5.0)
Eosinophils Absolute: 0 10*3/uL (ref 0.0–0.7)
HEMATOCRIT: 37.9 % (ref 36.0–46.0)
Hemoglobin: 12.8 g/dL (ref 12.0–15.0)
LYMPHS ABS: 1.6 10*3/uL (ref 0.7–4.0)
Lymphocytes Relative: 45.8 % (ref 12.0–46.0)
MCHC: 33.7 g/dL (ref 30.0–36.0)
MCV: 88.4 fl (ref 78.0–100.0)
MONOS PCT: 7 % (ref 3.0–12.0)
Monocytes Absolute: 0.2 10*3/uL (ref 0.1–1.0)
NEUTROS ABS: 1.6 10*3/uL (ref 1.4–7.7)
NEUTROS PCT: 45.7 % (ref 43.0–77.0)
Platelets: 213 10*3/uL (ref 150.0–400.0)
RBC: 4.28 Mil/uL (ref 3.87–5.11)
RDW: 13 % (ref 11.5–15.5)
WBC: 3.4 10*3/uL — ABNORMAL LOW (ref 4.0–10.5)

## 2016-01-31 LAB — HEPATIC FUNCTION PANEL
ALBUMIN: 4 g/dL (ref 3.5–5.2)
ALK PHOS: 50 U/L (ref 39–117)
ALT: 14 U/L (ref 0–35)
AST: 16 U/L (ref 0–37)
BILIRUBIN DIRECT: 0.1 mg/dL (ref 0.0–0.3)
TOTAL PROTEIN: 6.9 g/dL (ref 6.0–8.3)
Total Bilirubin: 0.4 mg/dL (ref 0.2–1.2)

## 2016-01-31 LAB — BASIC METABOLIC PANEL
BUN: 15 mg/dL (ref 6–23)
CO2: 29 meq/L (ref 19–32)
Calcium: 9.3 mg/dL (ref 8.4–10.5)
Chloride: 104 mEq/L (ref 96–112)
Creatinine, Ser: 0.72 mg/dL (ref 0.40–1.20)
GFR: 90.77 mL/min (ref >60.00)
GLUCOSE: 87 mg/dL (ref 70–99)
POTASSIUM: 4.2 meq/L (ref 3.5–5.1)
SODIUM: 138 meq/L (ref 135–145)

## 2016-01-31 LAB — TSH: TSH: 1.23 u[IU]/mL (ref 0.35–4.50)

## 2016-02-01 ENCOUNTER — Ambulatory Visit: Payer: 59 | Attending: Family Medicine | Admitting: Physical Therapy

## 2016-02-01 DIAGNOSIS — M6281 Muscle weakness (generalized): Secondary | ICD-10-CM | POA: Diagnosis not present

## 2016-02-01 DIAGNOSIS — M542 Cervicalgia: Secondary | ICD-10-CM | POA: Diagnosis not present

## 2016-02-01 DIAGNOSIS — R293 Abnormal posture: Secondary | ICD-10-CM

## 2016-02-01 DIAGNOSIS — M62838 Other muscle spasm: Secondary | ICD-10-CM | POA: Diagnosis not present

## 2016-02-01 NOTE — Therapy (Signed)
[facility] Outpatient [facility] [address] [place], [place], [address] Phone: [phone]   Fax:  [phone]  Physical Therapy Evaluation  Patient Details  Name: Valerie [name] MRN: [id number] Date of Birth: [birth year] Referring Provider: Antoine Primas DO  Encounter Date: 02/01/2016      PT End of Session - 02/01/16 1723    Visit Number 1   Number of Visits 13   Date for PT Re-Evaluation 03/14/16   Authorization Type [facility]   PT Start Time 1632   PT Stop Time 1730   PT Time Calculation (min) 58 min   Activity Tolerance Patient tolerated treatment well   Behavior During Therapy [facility] for tasks assessed/performed      Past Medical History  Diagnosis Date  . Anemia     NOS  . GERD (gastroesophageal reflux disease)   . HSV infection     labialis  . 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 Allergy     cats  . RECTAL BLEEDING 07/10/2007    Qualifier: Diagnosis of  By: Fabian Sharp MD, Neta Mends 2008 colonoscopy [facility].     Past Surgical History  Procedure Laterality Date  . Lasik  2009    mono vision  . Dilitation & currettage/hystroscopy with versapoint resection  08/27/2012    Procedure: DILATATION & CURETTAGE/HYSTEROSCOPY WITH VERSAPOINT RESECTION;  Surgeon: Genia Del, MD;  Location: [facility];  Service: Gynecology;;    There were no vitals filed for this visit.       Subjective Assessment - 02/01/16 1638    Subjective pt is a 50 y.o with CC of neck pain that has been going for a couple of years. With no specific traumatic onset. pt reports having been to a couple rounds of physical therapy for her neck which has helped. pain seems to flucatuate. Pain refers  to bil shoulder blades pt denies N/T down the arms.    Diagnostic tests x-rays 1 year ago   Patient Stated Goals decrease pain, to get back to exercising, pilates and yoga,    Currently in Pain? Yes   Pain Score 5    Pain Location Neck   Pain Descriptors / Indicators  Aching;Tightness   Pain Type Chronic pain   Pain Radiating Towards down to bil shoulder blades   Pain Onset More than a month ago   Pain Frequency Intermittent   Aggravating Factors  unsure what makes it worse   Pain Relieving Factors massage, ice, thoracic pillow for support, gentle stretching and exercise            [facility] PT Assessment - 02/01/16 1646    Assessment   Medical Diagnosis neck pain    Referring Provider Antoine Primas DO   Onset Date/Surgical Date --  3 years   Hand Dominance Right   Next MD Visit /12/2015   Prior Therapy Yes  couple rounds of physical therapy.   Precautions   Precautions None   Restrictions   Weight Bearing Restrictions No   Balance Screen   Has the patient fallen in the past 6 months No   Has the patient had a decrease in activity level because of a fear of falling?  No   Is the patient reluctant to leave their home because of a fear of falling?  No   Home Environment   Living Environment Private residence   Living Arrangements Spouse/significant other   Available Help at Discharge Available PRN/intermittently   Type  of Home House   Home Access Stairs to enter   Entrance Stairs-Number of Steps 3   Entrance Stairs-Rails None   Home Layout Two level   Alternate Level Stairs-Number of Steps 15   Alternate Level Stairs-Rails Right   Prior Function   Level of Independence Independent;Independent with basic ADLs   Vocation Full time employment  inpatient Rehab    Vocation Requirements pushing/ pulling, typing, prolonged sitting   Leisure exercise, reading, walking, gardening,    Observation/Other Assessments   Focus on Therapeutic Outcomes (FOTO)  35% limited  predicted 30% limited   Posture/Postural Control   Posture/Postural Control Postural limitations   Postural Limitations Rounded Shoulders;Forward head   ROM / Strength   AROM / PROM / Strength AROM;Strength;PROM   AROM   Overall AROM Comments shoulder mobility is WNL   AROM  Assessment Site Cervical;Shoulder   Right/Left Shoulder Right;Left   Cervical Flexion 42  pulling at end range   Cervical Extension 40  pain in the L shoulder   Cervical - Right Side Bend 38  burning into the L UT   Cervical - Left Side Bend 42  burning into the L UT   Cervical - Right Rotation 42  starts in 10 degrees of L rotation   Cervical - Left Rotation 50  starts in 10 degress of L rot   Strength   Strength Assessment Site Shoulder   Right/Left Shoulder Right;Left   Right Shoulder Flexion 4-/5   Right Shoulder Extension 4+/5   Right Shoulder ABduction 4/5   Right Shoulder Internal Rotation 4+/5   Right Shoulder External Rotation 4+/5   Left Shoulder Flexion 4-/5  pain during testing   Left Shoulder Extension 4+/5   Left Shoulder ABduction 4/5  soreness with testing   Left Shoulder Internal Rotation 4+/5   Left Shoulder External Rotation 4+/5   Palpation   Spinal mobility hypomobilty of C3-L6 R lateral gapping mobs, with increased tendnerness at C4.   Palpation comment tightness of bil cervical multifidus, upper traps, levator scapulae bil with L>R with multiple palpable trigger points                   [facility] Adult PT Treatment/Exercise - 02/01/16 1646    Manual Therapy   Manual Therapy Soft tissue mobilization   Soft tissue mobilization IASTM over L upper trap and levator scapulae   Neck Exercises: Stretches   Upper Trapezius Stretch 2 reps;30 seconds   Levator Stretch 2 reps;30 seconds          Trigger Point Dry Needling - 02/01/16 1743    Consent Given? Yes   Education Handout Provided Yes   Muscles Treated Upper Body Upper trapezius   Upper Trapezius Response Twitch reponse elicited;Palpable increased muscle length  L only              PT Education - 02/01/16 1721    Education provided Yes   Education Details evaluation findings, POC, goals, HEP with form and treatemtn rationale, Dry needling education benefits, what to expect and  after care.    Person(s) Educated Patient   Methods Explanation;Handout;Verbal cues   Comprehension Verbalized understanding;Verbal cues required          PT Short Term Goals - 02/01/16 1744    PT SHORT TERM GOAL #1   Title pt will be I with inital HEP (02/22/2016)   Time 3   Period Weeks   Status New   PT SHORT TERM GOAL #2  Title pt will be able to verbalize and demonstrate techniques to reduce cervical pain and inflammation via RICE and HEP (02/22/2016)   Time 3   Period Weeks   Status New           PT Long Term Goals - 02/01/16 1745    PT LONG TERM GOAL #1   Title pt will be I with all HEP given as of last visit (03/14/16)   Time 6   Period Weeks   Status New   PT LONG TERM GOAL #2   Title pt will demontrate decrease tightness of bil upper trap/ levator scapulae and surrounding muscualture to decrease pain to </= 2/10 (03/14/16)   Time 6   Period Weeks   Status New   PT LONG TERM GOAL #3   Title pt will improve cervical mobility by >/= 5 degrees in all planes with </= 2/10 pain to assist with ADLs and promote safety during driving (1/61/09)   Time 6   Period Weeks   Status New   PT LONG TERM GOAL #4   Title pt will be able to lift and carrying >/= 8# at shoulder heigh or height with one or both arms with </= 2/10 pain to assist with funcitonal lifting and carrying activities (03/14/16)   Time 6   Period Weeks   Status New   PT LONG TERM GOAL #5   Title pt will improve her FOTO score to >/= 70 to demonstrate improvement in function at discharge (03/14/16)   Time 6   Period Weeks   Status New               Plan - 02/01/16 1725    Clinical Impression Statement Mrs. Hinze presents to OPPT as a Low complexity evaluation with CC of neck pain. She demonstrates functional cervical mobiltiy with pain noted at end range in all planes with report of burning pain in the L>R. shoulder mobility is WNL however strength demonstrates mild weakness with pain during testing  with L>R. palpation revealed signifcant tightness of bil upper traps, levator scapulae bil with L>R, with hypomobilty of C3-L6 R lateral gapping mobs. she would benefit from physical therapy to decrease pain, reduce muslce spasm and tightness of bil UE, and return pt to PLOF by addressing the impairments listed.    Rehab Potential Good   PT Frequency 2x / week   PT Duration 6 weeks   PT Treatment/Interventions ADLs/Self Care Home Management;Cryotherapy;Electrical Stimulation;Iontophoresis 4mg /ml Dexamethasone;Moist Heat;Therapeutic exercise;Therapeutic activities;Vasopneumatic Device;Manual techniques;Dry needling;Ultrasound;Patient/family education;Passive range of motion;Taping   PT Next Visit Plan assess/ review HEP, assess response to DN, manual to calm down tightnes, thoracic/ cerivcal mobs, scapular stability, MHP   PT Home Exercise Plan upper trap/ levator scap stretch, chin tucks in supine/ sitting,    Consulted and Agree with Plan of Care Patient      Patient will benefit from skilled therapeutic intervention in order to improve the following deficits and impairments:  Pain, Improper body mechanics, Postural dysfunction, Hypomobility, Decreased activity tolerance, Decreased endurance, Decreased range of motion, Decreased strength, Increased muscle spasms, Impaired UE functional use  Visit Diagnosis: Neck pain - Plan: PT plan of care cert/re-cert  Other muscle spasm - Plan: PT plan of care cert/re-cert  Muscle weakness (generalized) - Plan: PT plan of care cert/re-cert  Abnormal posture - Plan: PT plan of care cert/re-cert     Problem List Patient Active Problem List   Diagnosis Date Noted  . Trigger point of left shoulder  region 09/21/2015  . Impingement syndrome of right shoulder 03/08/2015  . Trigger point of right shoulder region 03/08/2015  . Low back pain 03/16/2014  . Wrist pain, right 02/09/2014  . Nonallopathic lesion of cervical region 11/03/2013  . Nonallopathic  lesion of thoracic region 11/03/2013  . Nonallopathic lesion-rib cage 11/03/2013  . Nonallopathic lesion of lumbosacral region 11/03/2013  . Nonallopathic lesion of sacral region 11/03/2013  . Imbalance 10/27/2012  . Neck pain 10/27/2012  . Headache(784.0) 07/15/2012  . Abdominal cramps 02/25/2012  . Abnormal stools 02/25/2012  . Delayed menses 02/25/2012  . Visit for preventive health examination 12/03/2011  . Abdominal bloating 12/03/2011  . DYSESTHESIA 09/20/2009  . ARREST OF BONE DEVELOPMENT OR GROWTH 07/24/2007  . DERMATOPHYTOSIS OF NAIL 07/10/2007  . ANEMIA-NOS 07/10/2007  . ALLERGIC RHINITIS 07/10/2007  . GERD 07/10/2007  . BLOOD IN STOOL 07/10/2007  . HERPES LABIALIS, HX OF 07/10/2007   Lulu RidingKristoffer Veneta Sliter PT, DPT, LAT, ATC  02/01/2016  5:54 PM      [facility] [address] [place], [place], [address] Phone: [phone]   Fax:  [phone]  Name: Valerie Creeamela S [name] MRN: [id number] Date of Birth: [birth year]

## 2016-02-12 ENCOUNTER — Encounter: Payer: Self-pay | Admitting: Internal Medicine

## 2016-02-12 ENCOUNTER — Ambulatory Visit (INDEPENDENT_AMBULATORY_CARE_PROVIDER_SITE_OTHER): Payer: 59 | Admitting: Internal Medicine

## 2016-02-12 VITALS — BP 114/70 | Temp 98.0°F | Ht 63.0 in | Wt 124.5 lb

## 2016-02-12 DIAGNOSIS — Z0000 Encounter for general adult medical examination without abnormal findings: Secondary | ICD-10-CM

## 2016-02-12 NOTE — Patient Instructions (Addendum)
Continue lifestyle intervention healthy eating and exercise . Can  Get HIV testing with next blood test.   Not high risk  At this time .  Check on when pap due  Make appt for gyne as appropriate.      Health Maintenance, Female Adopting a healthy lifestyle and getting preventive care can go a long way to promote health and wellness. Talk with your health care provider about what schedule of regular examinations is right for you. This is a good chance for you to check in with your provider about disease prevention and staying healthy. In between checkups, there are plenty of things you can do on your own. Experts have done a lot of research about which lifestyle changes and preventive measures are most likely to keep you healthy. Ask your health care provider for more information. WEIGHT AND DIET  Eat a healthy diet  Be sure to include plenty of vegetables, fruits, low-fat dairy products, and lean protein.  Do not eat a lot of foods high in solid fats, added sugars, or salt.  Get regular exercise. This is one of the most important things you can do for your health.  Most adults should exercise for at least 150 minutes each week. The exercise should increase your heart rate and make you sweat (moderate-intensity exercise).  Most adults should also do strengthening exercises at least twice a week. This is in addition to the moderate-intensity exercise.  Maintain a healthy weight  Body mass index (BMI) is a measurement that can be used to identify possible weight problems. It estimates body fat based on height and weight. Your health care provider can help determine your BMI and help you achieve or maintain a healthy weight.  For females 10 years of age and older:   A BMI below 18.5 is considered underweight.  A BMI of 18.5 to 24.9 is normal.  A BMI of 25 to 29.9 is considered overweight.  A BMI of 30 and above is considered obese.  Watch levels of cholesterol and blood  lipids  You should start having your blood tested for lipids and cholesterol at 51 years of age, then have this test every 5 years.  You may need to have your cholesterol levels checked more often if:  Your lipid or cholesterol levels are high.  You are older than 51 years of age.  You are at high risk for heart disease.  CANCER SCREENING   Lung Cancer  Lung cancer screening is recommended for adults 35-43 years old who are at high risk for lung cancer because of a history of smoking.  A yearly low-dose CT scan of the lungs is recommended for people who:  Currently smoke.  Have quit within the past 15 years.  Have at least a 30-pack-year history of smoking. A pack year is smoking an average of one pack of cigarettes a day for 1 year.  Yearly screening should continue until it has been 15 years since you quit.  Yearly screening should stop if you develop a health problem that would prevent you from having lung cancer treatment.  Breast Cancer  Practice breast self-awareness. This means understanding how your breasts normally appear and feel.  It also means doing regular breast self-exams. Let your health care provider know about any changes, no matter how small.  If you are in your 20s or 30s, you should have a clinical breast exam (CBE) by a health care provider every 1-3 years as part of a regular  health exam.  If you are 40 or older, have a CBE every year. Also consider having a breast X-ray (mammogram) every year.  If you have a family history of breast cancer, talk to your health care provider about genetic screening.  If you are at high risk for breast cancer, talk to your health care provider about having an MRI and a mammogram every year.  Breast cancer gene (BRCA) assessment is recommended for women who have family members with BRCA-related cancers. BRCA-related cancers include:  Breast.  Ovarian.  Tubal.  Peritoneal cancers.  Results of the assessment  will determine the need for genetic counseling and BRCA1 and BRCA2 testing. Cervical Cancer Your health care provider may recommend that you be screened regularly for cancer of the pelvic organs (ovaries, uterus, and vagina). This screening involves a pelvic examination, including checking for microscopic changes to the surface of your cervix (Pap test). You may be encouraged to have this screening done every 3 years, beginning at age 13.  For women ages 66-65, health care providers may recommend pelvic exams and Pap testing every 3 years, or they may recommend the Pap and pelvic exam, combined with testing for human papilloma virus (HPV), every 5 years. Some types of HPV increase your risk of cervical cancer. Testing for HPV may also be done on women of any age with unclear Pap test results.  Other health care providers may not recommend any screening for nonpregnant women who are considered low risk for pelvic cancer and who do not have symptoms. Ask your health care provider if a screening pelvic exam is right for you.  If you have had past treatment for cervical cancer or a condition that could lead to cancer, you need Pap tests and screening for cancer for at least 20 years after your treatment. If Pap tests have been discontinued, your risk factors (such as having a new sexual partner) need to be reassessed to determine if screening should resume. Some women have medical problems that increase the chance of getting cervical cancer. In these cases, your health care provider may recommend more frequent screening and Pap tests. Colorectal Cancer  This type of cancer can be detected and often prevented.  Routine colorectal cancer screening usually begins at 51 years of age and continues through 51 years of age.  Your health care provider may recommend screening at an earlier age if you have risk factors for colon cancer.  Your health care provider may also recommend using home test kits to check  for hidden blood in the stool.  A small camera at the end of a tube can be used to examine your colon directly (sigmoidoscopy or colonoscopy). This is done to check for the earliest forms of colorectal cancer.  Routine screening usually begins at age 72.  Direct examination of the colon should be repeated every 5-10 years through 51 years of age. However, you may need to be screened more often if early forms of precancerous polyps or small growths are found. Skin Cancer  Check your skin from head to toe regularly.  Tell your health care provider about any new moles or changes in moles, especially if there is a change in a mole's shape or color.  Also tell your health care provider if you have a mole that is larger than the size of a pencil eraser.  Always use sunscreen. Apply sunscreen liberally and repeatedly throughout the day.  Protect yourself by wearing long sleeves, pants, a wide-brimmed hat, and  sunglasses whenever you are outside. HEART DISEASE, DIABETES, AND HIGH BLOOD PRESSURE   High blood pressure causes heart disease and increases the risk of stroke. High blood pressure is more likely to develop in:  People who have blood pressure in the high end of the normal range (130-139/85-89 mm Hg).  People who are overweight or obese.  People who are African American.  If you are 13-40 years of age, have your blood pressure checked every 3-5 years. If you are 65 years of age or older, have your blood pressure checked every year. You should have your blood pressure measured twice--once when you are at a hospital or clinic, and once when you are not at a hospital or clinic. Record the average of the two measurements. To check your blood pressure when you are not at a hospital or clinic, you can use:  An automated blood pressure machine at a pharmacy.  A home blood pressure monitor.  If you are between 55 years and 85 years old, ask your health care provider if you should take  aspirin to prevent strokes.  Have regular diabetes screenings. This involves taking a blood sample to check your fasting blood sugar level.  If you are at a normal weight and have a low risk for diabetes, have this test once every three years after 51 years of age.  If you are overweight and have a high risk for diabetes, consider being tested at a younger age or more often. PREVENTING INFECTION  Hepatitis B  If you have a higher risk for hepatitis B, you should be screened for this virus. You are considered at high risk for hepatitis B if:  You were born in a country where hepatitis B is common. Ask your health care provider which countries are considered high risk.  Your parents were born in a high-risk country, and you have not been immunized against hepatitis B (hepatitis B vaccine).  You have HIV or AIDS.  You use needles to inject street drugs.  You live with someone who has hepatitis B.  You have had sex with someone who has hepatitis B.  You get hemodialysis treatment.  You take certain medicines for conditions, including cancer, organ transplantation, and autoimmune conditions. Hepatitis C  Blood testing is recommended for:  Everyone born from [birth year] through 1966.  Anyone with known risk factors for hepatitis C. Sexually transmitted infections (STIs)  You should be screened for sexually transmitted infections (STIs) including gonorrhea and chlamydia if:  You are sexually active and are younger than 51 years of age.  You are older than 51 years of age and your health care provider tells you that you are at risk for this type of infection.  Your sexual activity has changed since you were last screened and you are at an increased risk for chlamydia or gonorrhea. Ask your health care provider if you are at risk.  If you do not have HIV, but are at risk, it may be recommended that you take a prescription medicine daily to prevent HIV infection. This is called  pre-exposure prophylaxis (PrEP). You are considered at risk if:  You are sexually active and do not regularly use condoms or know the HIV status of your partner(s).  You take drugs by injection.  You are sexually active with a partner who has HIV. Talk with your health care provider about whether you are at high risk of being infected with HIV. If you choose to begin PrEP, you should first  be tested for HIV. You should then be tested every 3 months for as long as you are taking PrEP.  PREGNANCY   If you are premenopausal and you may become pregnant, ask your health care provider about preconception counseling.  If you may become pregnant, take 400 to 800 micrograms (mcg) of folic acid every day.  If you want to prevent pregnancy, talk to your health care provider about birth control (contraception). OSTEOPOROSIS AND MENOPAUSE   Osteoporosis is a disease in which the bones lose minerals and strength with aging. This can result in serious bone fractures. Your risk for osteoporosis can be identified using a bone density scan.  If you are 42 years of age or older, or if you are at risk for osteoporosis and fractures, ask your health care provider if you should be screened.  Ask your health care provider whether you should take a calcium or vitamin D supplement to lower your risk for osteoporosis.  Menopause may have certain physical symptoms and risks.  Hormone replacement therapy may reduce some of these symptoms and risks. Talk to your health care provider about whether hormone replacement therapy is right for you.  HOME CARE INSTRUCTIONS   Schedule regular health, dental, and eye exams.  Stay current with your immunizations.   Do not use any tobacco products including cigarettes, chewing tobacco, or electronic cigarettes.  If you are pregnant, do not drink alcohol.  If you are breastfeeding, limit how much and how often you drink alcohol.  Limit alcohol intake to no more than 1  drink per day for nonpregnant women. One drink equals 12 ounces of beer, 5 ounces of wine, or 1 ounces of hard liquor.  Do not use street drugs.  Do not share needles.  Ask your health care provider for help if you need support or information about quitting drugs.  Tell your health care provider if you often feel depressed.  Tell your health care provider if you have ever been abused or do not feel safe at home.   This information is not intended to replace advice given to you by your health care provider. Make sure you discuss any questions you have with your health care provider.   Document Released: 02/18/2011 Document Revised: 08/26/2014 Document Reviewed: 07/07/2013 Elsevier Interactive Patient Education [employer].

## 2016-02-12 NOTE — Progress Notes (Signed)
Pre visit review using our clinic review tool, if applicable. No additional management support is needed unless otherwise documented below in the visit note.  Chief Complaint  Patient presents with  . Annual Exam    HPI: Patient  Valerie [name]  50 y.o. comes in today for Preventive Health Care visit   Health Maintenance  Topic Date Due  . HIV Screening  02/07/1980  . INFLUENZA VACCINE  03/19/2016  . PAP SMEAR  08/19/2016  . MAMMOGRAM  08/22/2017  . TETANUS/TDAP  12/04/2024  . COLONOSCOPY  11/15/2025   Health Maintenance Review LIFESTYLE:  Exercise:  Ave 1-2 per week .  Tobacco/ETS: no Alcohol: 5 x per year  Sugar beverages: agave   2 per day  Sleep:  6  Drug use: no Work  60 hours   Pap per dr Michelle Nasuti uncertain when ? 2 years .  1-2 gaba per day . Neck rehab [facility]   ROS:  GEN/ HEENT: No fever, significant weight changes sweats headaches vision problems hearing changes, CV/ PULM; No chest pain shortness of breath cough, syncope,edema  change in exercise tolerance. GI /GU: No adominal pain, vomiting, change in bowel habits. No blood in the stool. No significant GU symptoms. SKIN/HEME: ,no acute skin rashes suspicious lesions or bleeding. No lymphadenopathy, nodules, masses.  NEURO/ PSYCH:  No neurologic signs such as weakness numbness. No depression anxiety. IMM/ Allergy: No unusual infections.  Allergy .   REST of 12 system review negative except as per HPI   Past Medical History  Diagnosis Date  . Anemia     NOS  . GERD (gastroesophageal reflux disease)   . HSV infection     labialis  . 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 Allergy     cats  . RECTAL BLEEDING 07/10/2007    Qualifier: Diagnosis of  By: Regis Bill MD, Standley Brooking 2008 colonoscopy [facility].     Past Surgical History  Procedure Laterality Date  . Lasik  2009    mono vision  . Dilitation & currettage/hystroscopy with versapoint resection  08/27/2012    Procedure: DILATATION &  CURETTAGE/HYSTEROSCOPY WITH VERSAPOINT RESECTION;  Surgeon: Princess Bruins, MD;  Location: [facility];  Service: Gynecology;;    Family History  Problem Relation Age of Onset  . Colon polyps Brother     elev TG  . Alcohol abuse Other   . Breast cancer Maternal Aunt   . Diabetes Father   . Hyperlipidemia Father   . Heart disease Father   . Stroke Father   . Stroke Maternal Grandmother   . Diabetes Maternal Grandmother   . Heart disease Maternal Grandmother   . Diabetes Maternal Grandfather   . Hyperlipidemia Brother     elevated triglycerides  . Liver disease Maternal Uncle     alcohol related  . Colon cancer Neg Hx     Social History   Social History  . Marital Status: Married    Spouse Name: N/A  . Number of Children: N/A  . Years of Education: N/A   Occupational History  . PA [place]   Social History Main Topics  . Smoking status: Never Smoker   . Smokeless tobacco: Never Used  . Alcohol Use: 0.0 oz/week    0 Standard drinks or equivalent per week     Comment: occasional  . Drug Use: No  . Sexual Activity: Not Asked   Other Topics Concern  . 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 cetirizine (ZYRTEC) 10 MG tablet Take 10 mg by mouth daily as needed for allergies.    . cholecalciferol (VITAMIN D) 1000 UNITS tablet Take 1,000 Units by mouth daily.    Marland Kitchen EPINEPHrine 0.3 mg/0.3 mL IJ SOAJ injection as needed. Reported on 11/16/2015  1  . gabapentin (NEURONTIN) 100 MG capsule Take 100-200 mg by mouth at bedtime  3  . methocarbamol (ROBAXIN) 500 MG tablet Take 1 tablet (500 mg total) by mouth 3 (three) times daily. 90 tablet 0  . Multiple Vitamin (MULTIVITAMIN WITH MINERALS) TABS Take 1 tablet by mouth daily.    . Omega-3 Fatty  Acids (FISH OIL) 1200 MG CAPS Take 1,200 mg by mouth daily.    . TURMERIC PO Take by mouth.    . valACYclovir (VALTREX) 1000 MG tablet Take 1 tablet (1,000 mg total) by mouth 3 (three) times daily. 21 tablet 2  . hydrOXYzine (ATARAX/VISTARIL) 10 MG tablet Take 1 tablet (10 mg total) by mouth every 8 (eight) hours as needed. (Patient not taking: Reported on 02/01/2016) 90 tablet 0  . Methylsulfonylmethane (MSM) 1000 MG CAPS Take 1 capsule by mouth daily. Reported on 02/01/2016     No facility-administered medications prior to visit.     EXAM:  BP 114/70 mmHg  Temp(Src) 98 F (36.7 C) (Oral)  Ht 5' 3"  (1.6 m)  Wt 124 lb 8 oz (56.473 kg)  BMI 22.06 kg/m2  LMP 11/28/2014  Body mass index is 22.06 kg/(m^2).  Physical Exam: Vital signs reviewed YJE:HUDJ is a well-developed well-nourished alert cooperative    who appearsr stated age in no acute distress.  HEENT: normocephalic atraumatic , Eyes: PERRL EOM's full, conjunctiva clear, Nares: paten,t no deformity discharge or tenderness., Ears: no deformity EAC's clear TMs with normal landmarks. Mouth: clear OP, no lesions, edema.  Moist mucous membranes. Dentition in adequate repair. NECK: supple without masses, thyromegaly or bruits. CHEST/PULM:  Clear to auscultation and percussion breath sounds equal no wheeze , rales or rhonchi. No chest wall deformities or tenderness.Breast: normal by inspection . No dimpling, discharge, masses, tenderness or discharge . CV: PMI is nondisplaced, S1 S2 no gallops, murmurs, rubs. Peripheral pulses are full without delay.No JVD .  ABDOMEN: Bowel sounds normal nontender  No guard or rebound, no hepato splenomegal no CVA tenderness.  No hernia. Extremtities:  No clubbing cyanosis or edema, no acute joint swelling or redness no focal atrophy NEURO:  Oriented x3, cranial nerves 3-12 appear to be intact, no obvious focal weakness,gait within normal limits no abnormal reflexes or asymmetrical SKIN: No acute rashes  normal turgor, color, no bruising or petechiae. PSYCH: Oriented, good eye contact, no obvious depression anxiety, cognition and judgment appear normal. LN: no cervical axillary inguinal adenopathy  Lab Results  Component Value Date   WBC 3.4* 01/31/2016   HGB 12.8 01/31/2016   HCT 37.9 01/31/2016   PLT 213.0 01/31/2016   GLUCOSE 87 01/31/2016   CHOL 209* 01/31/2016   TRIG 53.0 01/31/2016   HDL 78.70 01/31/2016   LDLCALC 120* 01/31/2016   ALT 14 01/31/2016   AST 16 01/31/2016   NA 138 01/31/2016   K 4.2 01/31/2016   CL  104 01/31/2016   CREATININE 0.72 01/31/2016   BUN 15 01/31/2016   CO2 29 01/31/2016   TSH 1.23 01/31/2016    ASSESSMENT AND PLAN:  Discussed the following assessment and plan:  Visit for preventive health examination utd on hcm Lipid up slightly  Good ratio Low  wbc nl diff rest normal follow  Yearly.  Patient Care Team: Burnis Medin, MD as PCP - General Mosetta Anis, MD (Allergy) Princess Bruins, MD as Attending Physician (Obstetrics and Gynecology) Sable Feil, MD as Attending Physician (Gastroenterology) Lyndal Pulley, DO as Attending Physician (Sports Medicine) Patient Instructions  Continue lifestyle intervention healthy eating and exercise . Can  Get HIV testing with next blood test.   Not high risk  At this time .  Check on when pap due  Make appt for gyne as appropriate.      Health Maintenance, Female Adopting a healthy lifestyle and getting preventive care can go a long way to promote health and wellness. Talk with your health care provider about what schedule of regular examinations is right for you. This is a good chance for you to check in with your provider about disease prevention and staying healthy. In between checkups, there are plenty of things you can do on your own. Experts have done a lot of research about which lifestyle changes and preventive measures are most likely to keep you healthy. Ask your health care provider  for more information. WEIGHT AND DIET  Eat a healthy diet  Be sure to include plenty of vegetables, fruits, low-fat dairy products, and lean protein.  Do not eat a lot of foods high in solid fats, added sugars, or salt.  Get regular exercise. This is one of the most important things you can do for your health.  Most adults should exercise for at least 150 minutes each week. The exercise should increase your heart rate and make you sweat (moderate-intensity exercise).  Most adults should also do strengthening exercises at least twice a week. This is in addition to the moderate-intensity exercise.  Maintain a healthy weight  Body mass index (BMI) is a measurement that can be used to identify possible weight problems. It estimates body fat based on height and weight. Your health care provider can help determine your BMI and help you achieve or maintain a healthy weight.  For females 33 years of age and older:   A BMI below 18.5 is considered underweight.  A BMI of 18.5 to 24.9 is normal.  A BMI of 25 to 29.9 is considered overweight.  A BMI of 30 and above is considered obese.  Watch levels of cholesterol and blood lipids  You should start having your blood tested for lipids and cholesterol at 51 years of age, then have this test every 5 years.  You may need to have your cholesterol levels checked more often if:  Your lipid or cholesterol levels are high.  You are older than 51 years of age.  You are at high risk for heart disease.  CANCER SCREENING   Lung Cancer  Lung cancer screening is recommended for adults 36-72 years old who are at high risk for lung cancer because of a history of smoking.  A yearly low-dose CT scan of the lungs is recommended for people who:  Currently smoke.  Have quit within the past 15 years.  Have at least a 30-pack-year history of smoking. A pack year is smoking an average of one pack of cigarettes a day  for 1 year.  Yearly screening  should continue until it has been 15 years since you quit.  Yearly screening should stop if you develop a health problem that would prevent you from having lung cancer treatment.  Breast Cancer  Practice breast self-awareness. This means understanding how your breasts normally appear and feel.  It also means doing regular breast self-exams. Let your health care provider know about any changes, no matter how small.  If you are in your 20s or 30s, you should have a clinical breast exam (CBE) by a health care provider every 1-3 years as part of a regular health exam.  If you are 37 or older, have a CBE every year. Also consider having a breast X-ray (mammogram) every year.  If you have a family history of breast cancer, talk to your health care provider about genetic screening.  If you are at high risk for breast cancer, talk to your health care provider about having an MRI and a mammogram every year.  Breast cancer gene (BRCA) assessment is recommended for women who have family members with BRCA-related cancers. BRCA-related cancers include:  Breast.  Ovarian.  Tubal.  Peritoneal cancers.  Results of the assessment will determine the need for genetic counseling and BRCA1 and BRCA2 testing. Cervical Cancer Your health care provider may recommend that you be screened regularly for cancer of the pelvic organs (ovaries, uterus, and vagina). This screening involves a pelvic examination, including checking for microscopic changes to the surface of your cervix (Pap test). You may be encouraged to have this screening done every 3 years, beginning at age 5.  For women ages 8-65, health care providers may recommend pelvic exams and Pap testing every 3 years, or they may recommend the Pap and pelvic exam, combined with testing for human papilloma virus (HPV), every 5 years. Some types of HPV increase your risk of cervical cancer. Testing for HPV may also be done on women of any age with unclear  Pap test results.  Other health care providers may not recommend any screening for nonpregnant women who are considered low risk for pelvic cancer and who do not have symptoms. Ask your health care provider if a screening pelvic exam is right for you.  If you have had past treatment for cervical cancer or a condition that could lead to cancer, you need Pap tests and screening for cancer for at least 20 years after your treatment. If Pap tests have been discontinued, your risk factors (such as having a new sexual partner) need to be reassessed to determine if screening should resume. Some women have medical problems that increase the chance of getting cervical cancer. In these cases, your health care provider may recommend more frequent screening and Pap tests. Colorectal Cancer  This type of cancer can be detected and often prevented.  Routine colorectal cancer screening usually begins at 51 years of age and continues through 51 years of age.  Your health care provider may recommend screening at an earlier age if you have risk factors for colon cancer.  Your health care provider may also recommend using home test kits to check for hidden blood in the stool.  A small camera at the end of a tube can be used to examine your colon directly (sigmoidoscopy or colonoscopy). This is done to check for the earliest forms of colorectal cancer.  Routine screening usually begins at age 24.  Direct examination of the colon should be repeated every 5-10 years through 51 years  of age. However, you may need to be screened more often if early forms of precancerous polyps or small growths are found. Skin Cancer  Check your skin from head to toe regularly.  Tell your health care provider about any new moles or changes in moles, especially if there is a change in a mole's shape or color.  Also tell your health care provider if you have a mole that is larger than the size of a pencil eraser.  Always use  sunscreen. Apply sunscreen liberally and repeatedly throughout the day.  Protect yourself by wearing long sleeves, pants, a wide-brimmed hat, and sunglasses whenever you are outside. HEART DISEASE, DIABETES, AND HIGH BLOOD PRESSURE   High blood pressure causes heart disease and increases the risk of stroke. High blood pressure is more likely to develop in:  People who have blood pressure in the high end of the normal range (130-139/85-89 mm Hg).  People who are overweight or obese.  People who are African American.  If you are 43-88 years of age, have your blood pressure checked every 3-5 years. If you are 75 years of age or older, have your blood pressure checked every year. You should have your blood pressure measured twice--once when you are at a hospital or clinic, and once when you are not at a hospital or clinic. Record the average of the two measurements. To check your blood pressure when you are not at a hospital or clinic, you can use:  An automated blood pressure machine at a pharmacy.  A home blood pressure monitor.  If you are between 80 years and 25 years old, ask your health care provider if you should take aspirin to prevent strokes.  Have regular diabetes screenings. This involves taking a blood sample to check your fasting blood sugar level.  If you are at a normal weight and have a low risk for diabetes, have this test once every three years after 51 years of age.  If you are overweight and have a high risk for diabetes, consider being tested at a younger age or more often. PREVENTING INFECTION  Hepatitis B  If you have a higher risk for hepatitis B, you should be screened for this virus. You are considered at high risk for hepatitis B if:  You were born in a country where hepatitis B is common. Ask your health care provider which countries are considered high risk.  Your parents were born in a high-risk country, and you have not been immunized against hepatitis B  (hepatitis B vaccine).  You have HIV or AIDS.  You use needles to inject street drugs.  You live with someone who has hepatitis B.  You have had sex with someone who has hepatitis B.  You get hemodialysis treatment.  You take certain medicines for conditions, including cancer, organ transplantation, and autoimmune conditions. Hepatitis C  Blood testing is recommended for:  Everyone born from [birth year] through 1966.  Anyone with known risk factors for hepatitis C. Sexually transmitted infections (STIs)  You should be screened for sexually transmitted infections (STIs) including gonorrhea and chlamydia if:  You are sexually active and are younger than 51 years of age.  You are older than 51 years of age and your health care provider tells you that you are at risk for this type of infection.  Your sexual activity has changed since you were last screened and you are at an increased risk for chlamydia or gonorrhea. Ask your health care provider if  you are at risk.  If you do not have HIV, but are at risk, it may be recommended that you take a prescription medicine daily to prevent HIV infection. This is called pre-exposure prophylaxis (PrEP). You are considered at risk if:  You are sexually active and do not regularly use condoms or know the HIV status of your partner(s).  You take drugs by injection.  You are sexually active with a partner who has HIV. Talk with your health care provider about whether you are at high risk of being infected with HIV. If you choose to begin PrEP, you should first be tested for HIV. You should then be tested every 3 months for as long as you are taking PrEP.  PREGNANCY   If you are premenopausal and you may become pregnant, ask your health care provider about preconception counseling.  If you may become pregnant, take 400 to 800 micrograms (mcg) of folic acid every day.  If you want to prevent pregnancy, talk to your health care provider about birth  control (contraception). OSTEOPOROSIS AND MENOPAUSE   Osteoporosis is a disease in which the bones lose minerals and strength with aging. This can result in serious bone fractures. Your risk for osteoporosis can be identified using a bone density scan.  If you are 36 years of age or older, or if you are at risk for osteoporosis and fractures, ask your health care provider if you should be screened.  Ask your health care provider whether you should take a calcium or vitamin D supplement to lower your risk for osteoporosis.  Menopause may have certain physical symptoms and risks.  Hormone replacement therapy may reduce some of these symptoms and risks. Talk to your health care provider about whether hormone replacement therapy is right for you.  HOME CARE INSTRUCTIONS   Schedule regular health, dental, and eye exams.  Stay current with your immunizations.   Do not use any tobacco products including cigarettes, chewing tobacco, or electronic cigarettes.  If you are pregnant, do not drink alcohol.  If you are breastfeeding, limit how much and how often you drink alcohol.  Limit alcohol intake to no more than 1 drink per day for nonpregnant women. One drink equals 12 ounces of beer, 5 ounces of wine, or 1 ounces of hard liquor.  Do not use street drugs.  Do not share needles.  Ask your health care provider for help if you need support or information about quitting drugs.  Tell your health care provider if you often feel depressed.  Tell your health care provider if you have ever been abused or do not feel safe at home.   This information is not intended to replace advice given to you by your health care provider. Make sure you discuss any questions you have with your health care provider.   Document Released: 02/18/2011 Document Revised: 08/26/2014 Document Reviewed: 07/07/2013 Elsevier Interactive Patient Education 2016 Penton K. Panosh M.D.

## 2016-02-21 ENCOUNTER — Encounter: Payer: Self-pay | Admitting: Family Medicine

## 2016-02-21 ENCOUNTER — Ambulatory Visit (INDEPENDENT_AMBULATORY_CARE_PROVIDER_SITE_OTHER): Payer: 59 | Admitting: Family Medicine

## 2016-02-21 VITALS — BP 110/78 | HR 68 | Wt 124.0 lb

## 2016-02-21 DIAGNOSIS — M9901 Segmental and somatic dysfunction of cervical region: Secondary | ICD-10-CM | POA: Diagnosis not present

## 2016-02-21 DIAGNOSIS — M9904 Segmental and somatic dysfunction of sacral region: Secondary | ICD-10-CM | POA: Diagnosis not present

## 2016-02-21 DIAGNOSIS — M542 Cervicalgia: Secondary | ICD-10-CM

## 2016-02-21 DIAGNOSIS — M9902 Segmental and somatic dysfunction of thoracic region: Secondary | ICD-10-CM

## 2016-02-21 DIAGNOSIS — M999 Biomechanical lesion, unspecified: Secondary | ICD-10-CM

## 2016-02-21 DIAGNOSIS — M9903 Segmental and somatic dysfunction of lumbar region: Secondary | ICD-10-CM | POA: Diagnosis not present

## 2016-02-21 NOTE — Assessment & Plan Note (Signed)
Overall seems to be doing relatively well. Patient continues to tightness of the trapezius. Encourage patient to continue to work on home exercises and icing. No significant changes in this time. She has responded to tear point injection previously. Necessary will consider this in the future. The moment patient will see me again in 6 week intervals.

## 2016-02-21 NOTE — Assessment & Plan Note (Signed)
Decision today to treat with OMT was based on Physical Exam  After verbal consent patient was treated with HVLA, ME, FPR techniques in cervical, rib, thoracic, lumbar and sacral areas more muscle energy within the cervical spine.  Patient tolerated the procedure well with improvement in symptoms  Patient given exercises, stretches and lifestyle modifications  See medications in patient instructions if given  Patient will follow up in 6 weeks

## 2016-02-21 NOTE — Progress Notes (Signed)
Tawana ScaleZach Landra Howze D.O. [facility] [address] [place], [place] [address] Phone: [phone] Subjective:    CC: Neck and back pain follow up  HQI:[id number]:Subjective Valerie Creeamela S [name] is a 50 y.o. female coming in with complaint of neck and back pain. Patient has been on medication for the last 10 days and has noticed significant improvement. Patient knows and likely some of the pain is secondary to stress. Patient has not been doing the exercises regularly. Patient did try dry needling one time but states that it was painful and not a lot of help. Has not needed her muscle relaxer or the gabapentin recently.      Past Medical History  Diagnosis Date  . Anemia     NOS  . GERD (gastroesophageal reflux disease)   . HSV infection     labialis  . 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 Allergy     cats  . RECTAL BLEEDING 07/10/2007    Qualifier: Diagnosis of  By: [name], Neta MendsWanda K 2008 colonoscopy [facility].    Past Surgical History  Procedure Laterality Date  . Lasik  2009    mono vision  . Dilitation & currettage/hystroscopy with versapoint resection  08/27/2012    Procedure: DILATATION & CURETTAGE/HYSTEROSCOPY WITH VERSAPOINT RESECTION;  Surgeon: Genia DelMarie-Lyne Lavoie, MD;  Location: [facility];  Service: Gynecology;;   Social History   Social History  . Marital Status: Married    Spouse Name: N/A  . Number of Children: N/A  . Years of Education: N/A   Occupational History  . 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 Smoking status: Never Smoker   . Smokeless tobacco: Never Used  . Alcohol Use: 0.0 oz/week    0 Standard drinks or equivalent per week     Comment: occasional  . Drug Use: No  . Sexual Activity: Not on file   Other Topics Concern  . 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 Augmentin [Amoxicillin-Pot Clavulanate] Diarrhea  . Ceftin [Cefuroxime Axetil] Other (See Comments)    Severe yeast infection  . Clarithromycin Other (See Comments)    Significant metallic taste  . Nsaids Nausea Only and Other (See Comments)    Abdominal pain / severe reflux     Family History  Problem Relation Age of Onset  . Colon polyps Brother     elev TG  . Alcohol abuse Other   . Breast cancer Maternal Aunt   . Diabetes Father   . Hyperlipidemia Father   . Heart disease Father   . Stroke Father   . Stroke Maternal Grandmother   . Diabetes Maternal Grandmother   . Heart disease Maternal Grandmother   . Diabetes Maternal Grandfather   . Hyperlipidemia Brother     elevated triglycerides  . Liver disease Maternal Uncle     alcohol related  . Colon cancer Neg Hx       Review of Systems: No headache, visual changes, nausea, vomiting, diarrhea, constipation, dizziness, abdominal pain, skin rash, fevers, chills, night sweats, weight loss, swollen lymph nodes, body aches, joint swelling, muscle aches, chest pain, shortness of breath, mood changes.  Objective Blood pressure 110/78, pulse 68, weight 124 lb (56.246 kg), last menstrual period 11/28/2014.  General: No apparent distress alert and oriented x3 mood and affect normal, dressed appropriately.  HEENT: Pupils equal, extraocular movements intact  Respiratory: Patient's speak in full sentences and does not appear short of breath  Cardiovascular: No lower extremity edema, non tender, no erythema  Skin: Warm dry intact with no signs of infection or rash on extremities or on axial skeleton.  Abdomen: Soft nontender  Neuro: Cranial nerves II through XII are intact, neurovascularly intact in all extremities with 2+ DTRs and 2+ pulses.  Lymph: No lymphadenopathy of posterior or anterior cervical chain or axillae bilaterally.  Gait normal with good balance and coordination.  MSK:  Non tender with full  range of motion and good stability and symmetric strength and tone of Shoulder, elbows, wrist, hip, knee and ankles bilaterally.     Neck: Inspection unremarkable. No palpable stepoff Continue mild positive Spurling's on the left side No sensory change to C4 to T1 Negative Hoffman sign bilaterally Reflexes normal No crepitus noted.  Back Exam:  Inspection: Unremarkable  Motion: Flexion 45 deg improved, Extension 35 deg, Side Bending to 35 deg bilaterally,  Rotation to 45 deg bilaterally  SLR laying: Negative  XSLR laying: Negative  FABER: negative. Sensory change: Gross sensation intact to all lumbar and sacral dermatomes.  Reflexes: 2+ at both patellar tendons, 2+ at achilles tendons, Babinski's downgoing.  Strength at foot  Plantar-flexion: 5/5 Dorsi-flexion: 5/5 Eversion: 5/5 Inversion: 5/5  Leg strength  Quad: 5/5 Hamstring: 5/5 Hip flexor: 5/5 Hip abductors: 4/5  Gait unremarkable.   OMT Physical Exam Cervical  C2 flexed rotated and side bent left   Thoracic T1 extended rotated and side bent left with elevated first rib T7 extended rotated and side bent left  Lumbar L2 flexed rotated inside that right  Sacrum Left on left       Impression and Recommendations:     This case required medical decision making of moderate complexity.

## 2016-02-21 NOTE — Patient Instructions (Signed)
Vacation agrees with you! Now back to work ;( [employer], you know Try to remember this calm feeling.  Maybe try dry needling one more time but otherwise we can do trigger points when acting up  See me again in 6 weeks.

## 2016-02-22 DIAGNOSIS — J301 Allergic rhinitis due to pollen: Secondary | ICD-10-CM | POA: Diagnosis not present

## 2016-02-22 DIAGNOSIS — J3089 Other allergic rhinitis: Secondary | ICD-10-CM | POA: Diagnosis not present

## 2016-02-22 DIAGNOSIS — J3081 Allergic rhinitis due to animal (cat) (dog) hair and dander: Secondary | ICD-10-CM | POA: Diagnosis not present

## 2016-02-26 ENCOUNTER — Ambulatory Visit: Payer: 59 | Attending: Family Medicine | Admitting: Physical Therapy

## 2016-02-26 DIAGNOSIS — M62838 Other muscle spasm: Secondary | ICD-10-CM | POA: Insufficient documentation

## 2016-02-26 DIAGNOSIS — M6281 Muscle weakness (generalized): Secondary | ICD-10-CM | POA: Insufficient documentation

## 2016-02-26 DIAGNOSIS — R293 Abnormal posture: Secondary | ICD-10-CM | POA: Diagnosis not present

## 2016-02-26 DIAGNOSIS — M542 Cervicalgia: Secondary | ICD-10-CM | POA: Insufficient documentation

## 2016-02-26 NOTE — Therapy (Signed)
Valerie [name], [place], [address] Phone: [phone]   Fax:  [phone]  Physical Therapy Treatment  Patient Details  Name: Valerie [name] MRN: [id number] Date of Birth: [birth year] Referring Provider: Hulan Saas DO  Encounter Date: 02/26/2016      PT End of Session - 02/26/16 1732    Visit Number 2   Number of Visits 13   Date for PT Re-Evaluation 03/14/16   Authorization Type [facility]   PT Start Time 1632   PT Stop Time 1722   PT Time Calculation (min) 50 min   Activity Tolerance Patient tolerated treatment well   Behavior During Therapy [name] for tasks assessed/performed      Past Medical History  Diagnosis Date  . Anemia     NOS  . GERD (gastroesophageal reflux disease)   . HSV infection     labialis  . 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 Allergy     cats  . RECTAL BLEEDING 07/10/2007    Qualifier: Diagnosis of  By: Regis Bill MD, Standley Brooking 2008 colonoscopy [facility].     Past Surgical History  Procedure Laterality Date  . Lasik  2009    mono vision  . Dilitation & currettage/hystroscopy with versapoint resection  08/27/2012    Procedure: DILATATION & CURETTAGE/HYSTEROSCOPY WITH VERSAPOINT RESECTION;  Surgeon: Princess Bruins, MD;  Location: [facility];  Service: Gynecology;;    There were no vitals filed for this visit.      Subjective Assessment - 02/26/16 1637    Subjective (p) "I've been doing well, I just got back from vacation so overal I am doing pretty good"    Currently in Pain? (p) Yes   Pain Score (p) 2    Pain Location (p) Neck   Pain Orientation (p) Mid   Pain Frequency (p) Intermittent                         [facility] Adult PT Treatment/Exercise - 02/26/16 0001    Shoulder Exercises: Standing   Row AROM;Strengthening;Both;10 reps   Modalities   Modalities Moist Heat   Moist Heat Therapy   Number Minutes Moist Heat 10 Minutes   Moist Heat Location  Shoulder  pt in supine   Manual Therapy   Manual Therapy Scapular mobilization;Myofascial release   Manual therapy comments manual trigger point release x 2 performed bil upper traps, trial of strain/ counter strain technique on R upper trap   Soft tissue mobilization IASTM over L upper trap and levator scapulae   Scapular Mobilization grade 3 in all directions, empahsis on elevation in combination with upward rotatoin  reported decreased pain post session with should abd/flex   Neck Exercises: Stretches   Upper Trapezius Stretch 2 reps;30 seconds   Levator Stretch 2 reps;30 seconds                PT Education - 02/26/16 1732    Education provided Yes   Education Details updated HEP for scapular stabilizers with proper form and treatment rationale, HEP review   Person(s) Educated Patient   Methods Explanation;Handout;Verbal cues;Demonstration   Comprehension Verbalized understanding;Verbal cues required;Returned demonstration          PT Short Term Goals - 02/26/16 1736    PT SHORT TERM GOAL #1   Title pt will be I with inital HEP (02/22/2016)   Time 3   Period Weeks  Status Partially Met   PT SHORT TERM GOAL #2   Title pt will be able to verbalize and demonstrate techniques to reduce cervical pain and inflammation via RICE and HEP (02/22/2016)   Time 3   Period Weeks   Status Partially Met           PT Long Term Goals - 02/01/16 1745    PT LONG TERM GOAL #1   Title pt will be I with all HEP given as of last visit (03/14/16)   Time 6   Period Weeks   Status New   PT LONG TERM GOAL #2   Title pt will demontrate decrease tightness of bil upper trap/ levator scapulae and surrounding muscualture to decrease pain to </= 2/10 (03/14/16)   Time 6   Period Weeks   Status New   PT LONG TERM GOAL #3   Title pt will improve cervical mobility by >/= 5 degrees in all planes with </= 2/10 pain to assist with ADLs and promote safety during driving (03/14/16)   Time 6    Period Weeks   Status New   PT LONG TERM GOAL #4   Title pt will be able to lift and carrying >/= 8# at shoulder heigh or height with one or both arms with </= 2/10 pain to assist with funcitonal lifting and carrying activities (03/14/16)   Time 6   Period Weeks   Status New   PT LONG TERM GOAL #5   Title pt will improve her FOTO score to >/= 70 to demonstrate improvement in function at discharge (03/14/16)   Time 6   Period Weeks   Status New               Plan - 02/26/16 1733    Clinical Impression Statement [name] reports doing better since returning from her vacation. worked on manual trigger point release of bil upper traps and mobs of R scapula to promote proper scapulohumeral rhythm. post session pt reported pain dropped to 2/10. progressing well, pratially met all STG's.    PT Next Visit Plan assess response to DN, manual to calm down tightnes, thoracic/ cerivcal mobs, scapular stability, Modaliities PRN, ROM,    PT Home Exercise Plan rows, ceiling punches   Consulted and Agree with Plan of Care Patient      Patient will benefit from skilled therapeutic intervention in order to improve the following deficits and impairments:  Pain, Improper body mechanics, Postural dysfunction, Hypomobility, Decreased activity tolerance, Decreased endurance, Decreased range of motion, Decreased strength, Increased muscle spasms, Impaired UE functional use  Visit Diagnosis: Neck pain  Other muscle spasm  Muscle weakness (generalized)  Abnormal posture     Problem List Patient Active Problem List   Diagnosis Date Noted  . Trigger point of left shoulder region 09/21/2015  . Impingement syndrome of right shoulder 03/08/2015  . Trigger point of right shoulder region 03/08/2015  . Low back pain 03/16/2014  . Wrist pain, right 02/09/2014  . Nonallopathic lesion of cervical region 11/03/2013  . Nonallopathic lesion of thoracic region 11/03/2013  . Nonallopathic lesion-rib cage  11/03/2013  . Nonallopathic lesion of lumbosacral region 11/03/2013  . Nonallopathic lesion of sacral region 11/03/2013  . Imbalance 10/27/2012  . Neck pain 10/27/2012  . Headache(784.0) 07/15/2012  . Abdominal cramps 02/25/2012  . Abnormal stools 02/25/2012  . Delayed menses 02/25/2012  . Visit for preventive health examination 12/03/2011  . Abdominal bloating 12/03/2011  . DYSESTHESIA 09/20/2009  . ARREST   OF BONE DEVELOPMENT OR GROWTH 07/24/2007  . DERMATOPHYTOSIS OF NAIL 07/10/2007  . ANEMIA-NOS 07/10/2007  . ALLERGIC RHINITIS 07/10/2007  . GERD 07/10/2007  . BLOOD IN STOOL 07/10/2007  . HERPES LABIALIS, HX OF 07/10/2007   Kristoffer Leamon PT, DPT, LAT, ATC  02/26/2016  5:42 PM      [facility] [address] [place], [place], [address] Phone: [phone]   Fax:  [phone]  Name: Valerie [name] MRN: [id number] Date of Birth: 11/27/1965     

## 2016-02-28 ENCOUNTER — Ambulatory Visit: Payer: 59 | Admitting: Physical Therapy

## 2016-03-04 ENCOUNTER — Ambulatory Visit: Payer: 59 | Admitting: Physical Therapy

## 2016-03-04 DIAGNOSIS — M542 Cervicalgia: Secondary | ICD-10-CM

## 2016-03-04 DIAGNOSIS — M6281 Muscle weakness (generalized): Secondary | ICD-10-CM

## 2016-03-04 DIAGNOSIS — R293 Abnormal posture: Secondary | ICD-10-CM | POA: Diagnosis not present

## 2016-03-04 DIAGNOSIS — M62838 Other muscle spasm: Secondary | ICD-10-CM

## 2016-03-04 NOTE — Therapy (Signed)
[place], [place], [address] Phone: [phone]   Fax:  [phone]  Physical Therapy Treatment  Patient Details  Name: Valerie [name] MRN: [id number] Date of Birth: Jul 05, 1966 Referring Provider: Hulan Saas DO  Encounter Date: 03/04/2016      PT End of Session - 03/04/16 1716    Visit Number 3   Number of Visits 13   Date for PT Re-Evaluation 03/14/16   PT Start Time 1630   PT Stop Time 1719   PT Time Calculation (min) 49 min   Activity Tolerance Patient tolerated treatment well   Behavior During Therapy [facility] for tasks assessed/performed      Past Medical History  Diagnosis Date  . Anemia     NOS  . GERD (gastroesophageal reflux disease)   . HSV infection     labialis  . 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 Allergy     cats  . RECTAL BLEEDING 07/10/2007    Qualifier: Diagnosis of  By: Regis Bill MD, Standley Brooking 2008 colonoscopy [facility].     Past Surgical History  Procedure Laterality Date  . Lasik  2009    mono vision  . Dilitation & currettage/hystroscopy with versapoint resection  08/27/2012    Procedure: DILATATION & CURETTAGE/HYSTEROSCOPY WITH VERSAPOINT RESECTION;  Surgeon: Princess Bruins, MD;  Location: [facility];  Service: Gynecology;;    There were no vitals filed for this visit.      Subjective Assessment - 03/04/16 1632    Subjective "shoulder is doing fine, my r shoulder is bother me today"    Currently in Pain? Yes   Pain Score 3    Pain Location Neck   Pain Orientation Right   Pain Descriptors / Indicators Aching;Sore   Pain Type Chronic pain   Pain Onset More than a month ago   Pain Frequency Intermittent   Aggravating Factors  looking to the R and lifting the R arm                         [facility] Adult PT Treatment/Exercise - 03/04/16 0001    Shoulder Exercises: Standing   Protraction AROM;Strengthening;10 reps;Both   Row  AROM;Strengthening;Both;10 reps   Shoulder Exercises: ROM/Strengthening   UBE (Upper Arm Bike) L1 x 5 min  forward 2:30/backward 2:30   Moist Heat Therapy   Number Minutes Moist Heat 10 Minutes   Moist Heat Location Shoulder   Manual Therapy   Manual Therapy Myofascial release  r with pt in supine   Soft tissue mobilization IASTM over R upper trap and levator scapulae   Myofascial Release fascial stretching/ rolling over the R upper trap and levator scapulae   Neck Exercises: Stretches   Upper Trapezius Stretch 2 reps;30 seconds   Levator Stretch 2 reps;30 seconds   Corner Stretch 2 reps;30 seconds   Other Neck Stretches 3-way bicep stretch 3 x 30 sec          Trigger Point Dry Needling - 03/04/16 1636    Consent Given? Yes   Muscles Treated Upper Body Upper trapezius   Upper Trapezius Response Palpable increased muscle length;Twitch reponse elicited  R x 2                PT Short Term Goals - 02/26/16 1736    PT SHORT TERM GOAL #1   Title pt will be I with inital HEP (02/22/2016)  Time 3   Period Weeks   Status Partially Met   PT SHORT TERM GOAL #2   Title pt will be able to verbalize and demonstrate techniques to reduce cervical pain and inflammation via RICE and HEP (02/22/2016)   Time 3   Period Weeks   Status Partially Met           PT Long Term Goals - 02/01/16 1745    PT LONG TERM GOAL #1   Title pt will be I with all HEP given as of last visit (03/14/16)   Time 6   Period Weeks   Status New   PT LONG TERM GOAL #2   Title pt will demontrate decrease tightness of bil upper trap/ levator scapulae and surrounding muscualture to decrease pain to </= 2/10 (03/14/16)   Time 6   Period Weeks   Status New   PT LONG TERM GOAL #3   Title pt will improve cervical mobility by >/= 5 degrees in all planes with </= 2/10 pain to assist with ADLs and promote safety during driving (1/77/93)   Time 6   Period Weeks   Status New   PT LONG TERM GOAL #4   Title pt  will be able to lift and carrying >/= 8# at shoulder heigh or height with one or both arms with </= 2/10 pain to assist with funcitonal lifting and carrying activities (03/14/16)   Time 6   Period Weeks   Status New   PT LONG TERM GOAL #5   Title pt will improve her FOTO score to >/= 70 to demonstrate improvement in function at discharge (03/14/16)   Time [place] - 03/04/16 1717    Clinical Impression Statement Valerie [name] reports R shoulder pain. DN was performed on R upper trap x 3; pt monitored throughout treatment. Following DN DTM and myofascial techniques were performed. She reported decreased soreness following exercises and stretching. Utilized MHP post session calm down soreness from DN.    PT Next Visit Plan assess response to DN, manual to calm down tightnes, thoracic/ cerivcal mobs, scapular stability, Modaliities PRN, ROM,    Consulted and Agree with Plan of Care Patient      Patient will benefit from skilled therapeutic intervention in order to improve the following deficits and impairments:  Pain, Improper body mechanics, Postural dysfunction, Hypomobility, Decreased activity tolerance, Decreased endurance, Decreased range of motion, Decreased strength, Increased muscle spasms, Impaired UE functional use  Visit Diagnosis: Neck pain  Other muscle spasm  Abnormal posture  Muscle weakness (generalized)     Problem List Patient Active Problem List   Diagnosis Date Noted  . Trigger point of left shoulder region 09/21/2015  . Impingement syndrome of right shoulder 03/08/2015  . Trigger point of right shoulder region 03/08/2015  . Low back pain 03/16/2014  . Wrist pain, right 02/09/2014  . Nonallopathic lesion of cervical region 11/03/2013  . Nonallopathic lesion of thoracic region 11/03/2013  . Nonallopathic lesion-rib cage 11/03/2013  . Nonallopathic lesion of lumbosacral region 11/03/2013  . Nonallopathic lesion of  sacral region 11/03/2013  . Imbalance 10/27/2012  . Neck pain 10/27/2012  . Headache(784.0) 07/15/2012  . Abdominal cramps 02/25/2012  . Abnormal stools 02/25/2012  . Delayed menses 02/25/2012  . Visit for preventive health examination 12/03/2011  . Abdominal bloating 12/03/2011  . DYSESTHESIA 09/20/2009  . ARREST OF  BONE DEVELOPMENT OR GROWTH 07/24/2007  . DERMATOPHYTOSIS OF NAIL 07/10/2007  . ANEMIA-NOS 07/10/2007  . ALLERGIC RHINITIS 07/10/2007  . GERD 07/10/2007  . BLOOD IN STOOL 07/10/2007  . HERPES LABIALIS, HX OF 07/10/2007   Starr Lake PT, DPT, LAT, ATC  03/04/2016  5:19 PM      [facility] [address] [place], [place], [address] Phone: [phone]   Fax:  [phone]  Name: Valerie [name] MRN: [id number] Date of Birth: 01-29-1966

## 2016-03-06 ENCOUNTER — Ambulatory Visit: Payer: 59 | Admitting: Physical Therapy

## 2016-03-06 DIAGNOSIS — M542 Cervicalgia: Secondary | ICD-10-CM | POA: Diagnosis not present

## 2016-03-06 DIAGNOSIS — M6281 Muscle weakness (generalized): Secondary | ICD-10-CM | POA: Diagnosis not present

## 2016-03-06 DIAGNOSIS — M62838 Other muscle spasm: Secondary | ICD-10-CM

## 2016-03-06 DIAGNOSIS — R293 Abnormal posture: Secondary | ICD-10-CM

## 2016-03-06 NOTE — Patient Instructions (Signed)
  2 x 10 each direction with 1-2 Lb. weight.

## 2016-03-06 NOTE — Therapy (Signed)
[facility] [address] [place], [place], [address] Phone: [phone]   Fax:  [phone]  Physical Therapy Treatment  Patient Details  Name: Valerie [name] MRN: [id number] Date of Birth: Apr 13, 1966 Referring Provider: Antoine Primas DO  Encounter Date: 03/06/2016      PT End of Session - 03/06/16 1735    Visit Number 4   Number of Visits 13   Date for PT Re-Evaluation 03/14/16   Authorization Type [facility]   PT Start Time 1635   PT Stop Time 1735   PT Time Calculation (min) 60 min   Activity Tolerance Patient tolerated treatment well   Behavior During Therapy [facility] for tasks assessed/performed      Past Medical History  Diagnosis Date  . Anemia     NOS  . GERD (gastroesophageal reflux disease)   . HSV infection     labialis  . 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 Allergy     cats  . RECTAL BLEEDING 07/10/2007    Qualifier: Diagnosis of  By: Fabian Sharp MD, Neta Mends 2008 colonoscopy [facility].     Past Surgical History  Procedure Laterality Date  . Lasik  2009    mono vision  . Dilitation & currettage/hystroscopy with versapoint resection  08/27/2012    Procedure: DILATATION & CURETTAGE/HYSTEROSCOPY WITH VERSAPOINT RESECTION;  Surgeon: Genia Del, MD;  Location: [facility];  Service: Gynecology;;    There were no vitals filed for this visit.      Subjective Assessment - 03/06/16 1639    Subjective " some soreness today compared to last session, the L upper trap is more sore today"   Currently in Pain? Yes   Pain Score 3    Pain Location Neck   Pain Orientation Right;Left  L>R   Pain Descriptors / Indicators Aching;Sore   Pain Type Chronic pain   Pain Onset More than a month ago   Pain Frequency Intermittent                         [facility] Adult PT Treatment/Exercise - 03/06/16 0001    Shoulder Exercises: Prone   Other Prone Exercises I's, T's, Y's 1 x 10 each direction   Shoulder  Exercises: Standing   Protraction AROM;Strengthening;Both;15 reps  2 sets with 1 set alternating protraction   Extension AROM;AAROM;Both;15 reps;Theraband   Theraband Level (Shoulder Extension) Level 3 (Green)   Row AROM;Strengthening;Both;10 reps  2 sets   Theraband Level (Shoulder Row) Level 3 (Green)   Shoulder Exercises: ROM/Strengthening   UBE (Upper Arm Bike) L1.5 x 6 min  forward 3 min/ backward 3 min   Manual Therapy   Manual Therapy Myofascial release   Soft tissue mobilization IASTM over R upper trap and levator scapulae   Myofascial Release fascial stretching/ rolling over the R upper trap and levator scapulae   Neck Exercises: Stretches   Upper Trapezius Stretch 2 reps;30 seconds   Levator Stretch 2 reps;30 seconds   Corner Stretch 2 reps;30 seconds                PT Education - 03/06/16 1734    Education provided Yes   Education Details updated HEP to include scapualr stabilizers in prone I's Y's and T's with proper form and rationale   Person(s) Educated Patient   Methods Explanation;Handout;Demonstration;Verbal cues   Comprehension Verbalized understanding;Returned demonstration;Verbal cues required  PT Short Term Goals - 03/06/16 1739    PT SHORT TERM GOAL #1   Title pt will be I with inital HEP (02/22/2016)   Time 3   Period Weeks   Status Achieved   PT SHORT TERM GOAL #2   Title pt will be able to verbalize and demonstrate techniques to reduce cervical pain and inflammation via RICE and HEP (02/22/2016)   Time 3   Period Weeks   Status Achieved           PT Long Term Goals - 03/06/16 1739    PT LONG TERM GOAL #1   Title pt will be I with all HEP given as of last visit (03/14/16)   Time 6   Period Weeks   Status On-going   PT LONG TERM GOAL #2   Title pt will demontrate decrease tightness of bil upper trap/ levator scapulae and surrounding muscualture to decrease pain to </= 2/10 (03/14/16)   Time 6   Period Weeks   Status  On-going   PT LONG TERM GOAL #3   Title pt will improve cervical mobility by >/= 5 degrees in all planes with </= 2/10 pain to assist with ADLs and promote safety during driving (1/61/09)   Time 6   Period Weeks   Status On-going   PT LONG TERM GOAL #4   Title pt will be able to lift and carrying >/= 8# at shoulder heigh or height with one or both arms with </= 2/10 pain to assist with funcitonal lifting and carrying activities (03/14/16)   Time 6   Period Weeks   Status On-going   PT LONG TERM GOAL #5   Title pt will improve her FOTO score to >/= 70 to demonstrate improvement in function at discharge (03/14/16)   Time 6   Period Weeks   Status On-going               Plan - 03/06/16 1736    Clinical Impression Statement Mrs. Noh states her R shoulder is doing better. Attempted trial of L inhibition taping which pt reported relief. worked on scapular stabilizers which she performed well with some report of soreness. she is progressing toward her goals meeting all STGs today.    PT Next Visit Plan assess response to inhibition taping,  manual to calm down tightnes, thoracic/ cerivcal mobs, scapular stability, Modaliities PRN, ROM,  goals, Renewal?   PT Home Exercise Plan I's Y's and T's   Consulted and Agree with Plan of Care Patient      Patient will benefit from skilled therapeutic intervention in order to improve the following deficits and impairments:  Pain, Improper body mechanics, Postural dysfunction, Hypomobility, Decreased activity tolerance, Decreased endurance, Decreased range of motion, Decreased strength, Increased muscle spasms, Impaired UE functional use  Visit Diagnosis: Neck pain  Other muscle spasm  Abnormal posture  Muscle weakness (generalized)     Problem List Patient Active Problem List   Diagnosis Date Noted  . Trigger point of left shoulder region 09/21/2015  . Impingement syndrome of right shoulder 03/08/2015  . Trigger point of right  shoulder region 03/08/2015  . Low back pain 03/16/2014  . Wrist pain, right 02/09/2014  . Nonallopathic lesion of cervical region 11/03/2013  . Nonallopathic lesion of thoracic region 11/03/2013  . Nonallopathic lesion-rib cage 11/03/2013  . Nonallopathic lesion of lumbosacral region 11/03/2013  . Nonallopathic lesion of sacral region 11/03/2013  . Imbalance 10/27/2012  . Neck pain 10/27/2012  .  Headache(784.0) 07/15/2012  . Abdominal cramps 02/25/2012  . Abnormal stools 02/25/2012  . Delayed menses 02/25/2012  . Visit for preventive health examination 12/03/2011  . Abdominal bloating 12/03/2011  . DYSESTHESIA 09/20/2009  . ARREST OF BONE DEVELOPMENT OR GROWTH 07/24/2007  . DERMATOPHYTOSIS OF NAIL 07/10/2007  . ANEMIA-NOS 07/10/2007  . ALLERGIC RHINITIS 07/10/2007  . GERD 07/10/2007  . BLOOD IN STOOL 07/10/2007  . HERPES LABIALIS, HX OF 07/10/2007   Lulu RidingKristoffer Shaughnessy Gethers PT, DPT, LAT, ATC  03/06/2016  5:43 PM      [facility] [address] [place], [place], [address] Phone: [phone]   Fax:  [phone]  Name: Jacquelynn Creeamela S Whitford MRN: [id number] Date of Birth: [birth year]

## 2016-03-07 DIAGNOSIS — J3089 Other allergic rhinitis: Secondary | ICD-10-CM | POA: Diagnosis not present

## 2016-03-07 DIAGNOSIS — J301 Allergic rhinitis due to pollen: Secondary | ICD-10-CM | POA: Diagnosis not present

## 2016-03-07 DIAGNOSIS — J3081 Allergic rhinitis due to animal (cat) (dog) hair and dander: Secondary | ICD-10-CM | POA: Diagnosis not present

## 2016-03-11 ENCOUNTER — Encounter: Payer: 59 | Admitting: Physical Therapy

## 2016-03-13 ENCOUNTER — Encounter: Payer: 59 | Admitting: Physical Therapy

## 2016-03-18 ENCOUNTER — Ambulatory Visit: Payer: 59 | Admitting: Physical Therapy

## 2016-03-18 DIAGNOSIS — M542 Cervicalgia: Secondary | ICD-10-CM

## 2016-03-18 DIAGNOSIS — R293 Abnormal posture: Secondary | ICD-10-CM | POA: Diagnosis not present

## 2016-03-18 DIAGNOSIS — M6281 Muscle weakness (generalized): Secondary | ICD-10-CM

## 2016-03-18 DIAGNOSIS — M62838 Other muscle spasm: Secondary | ICD-10-CM | POA: Diagnosis not present

## 2016-03-18 NOTE — Therapy (Signed)
[place], [place], [address] Phone: [phone]   Fax:  [phone]  Physical Therapy Treatment / Re-certification  Patient Details  Name: Valerie [name] MRN: [id number] Date of Birth: 10-24-1965 Referring Provider: Hulan Saas DO  Encounter Date: 03/18/2016      PT End of Session - 03/18/16 1636    Visit Number 5   Number of Visits 13   Date for PT Re-Evaluation 04/15/16   PT Start Time 1631   PT Stop Time 1718   PT Time Calculation (min) 47 min   Activity Tolerance Patient tolerated treatment well   Behavior During Therapy [facility] for tasks assessed/performed      Past Medical History:  Diagnosis Date  . Allergy    cats  . Anemia    NOS  . 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 GERD (gastroesophageal reflux disease)   . HSV infection    labialis  . RECTAL BLEEDING 07/10/2007   Qualifier: Diagnosis of  By: Regis Bill MD, Standley Brooking 2008 colonoscopy [facility].     Past Surgical History:  Procedure Laterality Date  . [place] RESECTION  08/27/2012   Procedure: DILATATION & CURETTAGE/HYSTEROSCOPY WITH VERSAPOINT RESECTION;  Surgeon: Princess Bruins, MD;  Location: [facility];  Service: Gynecology;;  . LASIK  2009   mono vision    There were no vitals filed for this visit.      Subjective Assessment - 03/18/16 1633    Subjective "everthings going alright"   Currently in Pain? Yes   Pain Score 3    Pain Location Neck   Pain Orientation Left;Right  L>R   Pain Descriptors / Indicators Aching;Sore   Pain Type Chronic pain            [facility] PT Assessment - 03/18/16 1640      Observation/Other Assessments   Focus on Therapeutic Outcomes (FOTO)  33% limited     AROM   Cervical Flexion 62  tightness   Cervical Extension 50  tightness   Cervical - Right Side Bend 50  tightness    Cervical - Left Side Bend 50  tightness   Cervical - Right  Rotation 50  tightness   Cervical - Left Rotation 65  tightness     Strength   Right Shoulder Flexion 4/5   Right Shoulder Extension 4+/5   Right Shoulder ABduction 4/5  pain during testing   Right Shoulder Internal Rotation 4+/5   Right Shoulder External Rotation 4+/5   Left Shoulder Flexion 4/5   Left Shoulder Extension 4+/5   Left Shoulder ABduction 4/5   Left Shoulder Internal Rotation 4+/5   Left Shoulder External Rotation 4+/5                     [facility] Adult PT Treatment/Exercise - 03/18/16 0001      Shoulder Exercises: Prone   Other Prone Exercises I's, T's, Y's 1 x 10 each direction bil UE with 1#      Shoulder Exercises: ROM/Strengthening   UBE (Upper Arm Bike) L 2 x 7 min  alternating dir at 3.5 min     Modalities   Modalities Ultrasound     Ultrasound   Ultrasound Location R UT   Ultrasound Parameters 6 min, 100% 1.2 w/cm2   Ultrasound Goals Pain     Neck Exercises: Stretches   Upper Trapezius Stretch 2 reps;30 seconds  PT Short Term Goals - 03/18/16 1647      PT SHORT TERM GOAL #1   Title pt will be I with inital HEP (02/22/2016)   Time 3   Period Weeks   Status Achieved     PT SHORT TERM GOAL #2   Title pt will be able to verbalize and demonstrate techniques to reduce cervical pain and inflammation via RICE and HEP (02/22/2016)   Time 3   Period Weeks   Status Achieved           PT Long Term Goals - 03/18/16 1647      PT LONG TERM GOAL #1   Title pt will be I with all HEP given as of last visit (03/14/16)   Time 6   Period Weeks   Status On-going     PT LONG TERM GOAL #2   Title pt will demontrate decrease tightness of bil upper trap/ levator scapulae and surrounding muscualture to decrease pain to </= 2/10 (03/14/16)   Baseline 3-4/10   Time 6   Period Weeks   Status Partially Met     PT LONG TERM GOAL #3   Title pt will improve cervical mobility by >/= 5 degrees in all planes with </= 2/10 pain  to assist with ADLs and promote safety during driving (9/92/42)   Baseline 3-4/10 pain met ROM part of goal   Time 6   Period Weeks   Status Partially Met     PT LONG TERM GOAL #4   Title pt will be able to lift and carrying >/= 8# at shoulder heigh or height with one or both arms with </= 2/10 pain to assist with funcitonal lifting and carrying activities (03/14/16)   Baseline 3-4/10 pain but can lift 8# overhead   Time 6   Period Weeks   Status Partially Met     PT LONG TERM GOAL #5   Title pt will improve her FOTO score to >/= 70 to demonstrate improvement in function at discharge (03/14/16)   Baseline 54    Time 6   Period Weeks   Status On-going               Plan - 03/18/16 1731    Clinical Impression Statement Mrs. Poteete demosntrates improvement with cervical mobility in all planes with report of only tightness at end range. she she has partially met all goals. worked on stretching of the R upper trap and bil scapular stabilizers. Plan to conitnue 2 x a week for the next 4 weeks to work on remaining goals and work toward independent exercise.    Rehab Potential Good   PT Frequency 2x / week   PT Duration 4 weeks   PT Next Visit Plan assess response to [place], manual to calm down tightnes, thoracic/ cerivcal mobs, scapular stability, Modaliities    PT Home Exercise Plan HEP review   Consulted and Agree with Plan of Care Patient      Patient will benefit from skilled therapeutic intervention in order to improve the following deficits and impairments:  Pain, Improper body mechanics, Postural dysfunction, Hypomobility, Decreased activity tolerance, Decreased endurance, Decreased range of motion, Decreased strength, Increased muscle spasms, Impaired UE functional use  Visit Diagnosis: Neck pain  Other muscle spasm  Abnormal posture  Muscle weakness (generalized)     Problem List Patient Active Problem List   Diagnosis Date Noted  . Trigger point of left shoulder  region 09/21/2015  . Impingement syndrome of  right shoulder 03/08/2015  . Trigger point of right shoulder region 03/08/2015  . Low back pain 03/16/2014  . Wrist pain, right 02/09/2014  . Nonallopathic lesion of cervical region 11/03/2013  . Nonallopathic lesion of thoracic region 11/03/2013  . Nonallopathic lesion-rib cage 11/03/2013  . Nonallopathic lesion of lumbosacral region 11/03/2013  . Nonallopathic lesion of sacral region 11/03/2013  . Imbalance 10/27/2012  . Neck pain 10/27/2012  . Headache(784.0) 07/15/2012  . Abdominal cramps 02/25/2012  . Abnormal stools 02/25/2012  . Delayed menses 02/25/2012  . Visit for preventive health examination 12/03/2011  . Abdominal bloating 12/03/2011  . DYSESTHESIA 09/20/2009  . ARREST OF BONE DEVELOPMENT OR GROWTH 07/24/2007  . DERMATOPHYTOSIS OF NAIL 07/10/2007  . ANEMIA-NOS 07/10/2007  . ALLERGIC RHINITIS 07/10/2007  . GERD 07/10/2007  . BLOOD IN STOOL 07/10/2007  . HERPES LABIALIS, HX OF 07/10/2007   Starr Lake PT, DPT, LAT, ATC  03/18/16  5:35 PM      [facility] [address] [place], [place], [address] Phone: [phone]   Fax:  [phone]  Name: Valerie [name] MRN: [id number] Date of Birth: 09-03-1965

## 2016-03-20 ENCOUNTER — Ambulatory Visit: Payer: 59 | Attending: Family Medicine | Admitting: Physical Therapy

## 2016-03-20 DIAGNOSIS — J301 Allergic rhinitis due to pollen: Secondary | ICD-10-CM | POA: Diagnosis not present

## 2016-03-20 DIAGNOSIS — M6281 Muscle weakness (generalized): Secondary | ICD-10-CM

## 2016-03-20 DIAGNOSIS — J3081 Allergic rhinitis due to animal (cat) (dog) hair and dander: Secondary | ICD-10-CM | POA: Diagnosis not present

## 2016-03-20 DIAGNOSIS — R293 Abnormal posture: Secondary | ICD-10-CM

## 2016-03-20 DIAGNOSIS — M542 Cervicalgia: Secondary | ICD-10-CM | POA: Diagnosis not present

## 2016-03-20 DIAGNOSIS — M62838 Other muscle spasm: Secondary | ICD-10-CM

## 2016-03-20 DIAGNOSIS — J3089 Other allergic rhinitis: Secondary | ICD-10-CM | POA: Diagnosis not present

## 2016-03-20 NOTE — Therapy (Signed)
[place], [place], [address] Phone: [phone]   Fax:  [phone]  Physical Therapy Treatment  Patient Details  Name: Valerie [name] MRN: [id number] Date of Birth: Jul 27, 1966 Referring Provider: Hulan Saas DO  Encounter Date: 03/20/2016      PT End of Session - 03/20/16 1727    Visit Number 6   Number of Visits 13   Date for PT Re-Evaluation 04/15/16   Authorization Type [facility]   PT Start Time 1630   PT Stop Time 1717   PT Time Calculation (min) 47 min   Activity Tolerance Patient tolerated treatment well   Behavior During Therapy [facility] for tasks assessed/performed      Past Medical History:  Diagnosis Date  . Allergy    cats  . Anemia    NOS  . 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 GERD (gastroesophageal reflux disease)   . HSV infection    labialis  . RECTAL BLEEDING 07/10/2007   Qualifier: Diagnosis of  By: Regis Bill MD, Standley Brooking 2008 colonoscopy [facility].     Past Surgical History:  Procedure Laterality Date  . [place] RESECTION  08/27/2012   Procedure: DILATATION & CURETTAGE/HYSTEROSCOPY WITH VERSAPOINT RESECTION;  Surgeon: Princess Bruins, MD;  Location: [facility];  Service: Gynecology;;  . LASIK  2009   mono vision    There were no vitals filed for this visit.      Subjective Assessment - 03/20/16 1634    Subjective "It feels like its doing better today"    Currently in Pain? Yes   Pain Score 2    Pain Orientation Right;Left   Pain Descriptors / Indicators Tightness   Pain Onset More than a month ago   Aggravating Factors  Looking up    Pain Relieving Factors massage, ice,                          [facility] Adult PT Treatment/Exercise - 03/20/16 1638      Shoulder Exercises: Supine   Horizontal ABduction AROM;Strengthening;Both;15 reps;Theraband   Theraband Level (Shoulder Horizontal ABduction) Level 2  (Red)   Other Supine Exercises retraction with ER bil 1 x 10   with red theraband     Shoulder Exercises: ROM/Strengthening   UBE (Upper Arm Bike) L2 x 8 min  forward 4/ backward 4 min     Ultrasound   Ultrasound Location R upper trap   Ultrasound Parameters 8 min, 7 w/cm2, 100%   Ultrasound Goals Pain     Manual Therapy   Soft tissue mobilization IASTM over R upper trap and levator scapulae, lower cervical paraspinals   Myofascial Release fascial stretching/ rolling over the R upper trap and levator scapulae     Neck Exercises: Stretches   Upper Trapezius Stretch 2 reps;30 seconds                  PT Short Term Goals - 03/18/16 1647      PT SHORT TERM GOAL #1   Title pt will be I with inital HEP (02/22/2016)   Time 3   Period Weeks   Status Achieved     PT SHORT TERM GOAL #2   Title pt will be able to verbalize and demonstrate techniques to reduce cervical pain and inflammation via RICE and HEP (02/22/2016)   Time 3   Period Weeks   Status Achieved  PT Long Term Goals - 03/18/16 1647      PT LONG TERM GOAL #1   Title pt will be I with all HEP given as of last visit (03/14/16)   Time 6   Period Weeks   Status On-going     PT LONG TERM GOAL #2   Title pt will demontrate decrease tightness of bil upper trap/ levator scapulae and surrounding muscualture to decrease pain to </= 2/10 (03/14/16)   Baseline 3-4/10   Time 6   Period Weeks   Status Partially Met     PT LONG TERM GOAL #3   Title pt will improve cervical mobility by >/= 5 degrees in all planes with </= 2/10 pain to assist with ADLs and promote safety during driving (2/87/86)   Baseline 3-4/10 pain met ROM part of goal   Time 6   Period Weeks   Status Partially Met     PT LONG TERM GOAL #4   Title pt will be able to lift and carrying >/= 8# at shoulder heigh or height with one or both arms with </= 2/10 pain to assist with funcitonal lifting and carrying activities (03/14/16)    Baseline 3-4/10 pain but can lift 8# overhead   Time 6   Period Weeks   Status Partially Met     PT LONG TERM GOAL #5   Title pt will improve her FOTO score to >/= 70 to demonstrate improvement in function at discharge (03/14/16)   Baseline 54    Time 6   Period Weeks   Status On-going               Plan - 03/20/16 1727    Clinical Impression Statement Valerie [name] reports she is doing better with decreased pain in the R shoulder. following manual and [place] she reported no pain in the shoudler and neck and was able to do all exercises without pain.    PT Next Visit Plan assess response to [place], manual to calm down tightnes, thoracic/ cerivcal mobs, scapular stability, Modaliities    Consulted and Agree with Plan of Care Patient      Patient will benefit from skilled therapeutic intervention in order to improve the following deficits and impairments:  Pain, Improper body mechanics, Postural dysfunction, Hypomobility, Decreased activity tolerance, Decreased endurance, Decreased range of motion, Decreased strength, Increased muscle spasms, Impaired UE functional use  Visit Diagnosis: Neck pain  Other muscle spasm  Abnormal posture  Muscle weakness (generalized)     Problem List Patient Active Problem List   Diagnosis Date Noted  . Trigger point of left shoulder region 09/21/2015  . Impingement syndrome of right shoulder 03/08/2015  . Trigger point of right shoulder region 03/08/2015  . Low back pain 03/16/2014  . Wrist pain, right 02/09/2014  . Nonallopathic lesion of cervical region 11/03/2013  . Nonallopathic lesion of thoracic region 11/03/2013  . Nonallopathic lesion-rib cage 11/03/2013  . Nonallopathic lesion of lumbosacral region 11/03/2013  . Nonallopathic lesion of sacral region 11/03/2013  . Imbalance 10/27/2012  . Neck pain 10/27/2012  . Headache(784.0) 07/15/2012  . Abdominal cramps 02/25/2012  . Abnormal stools 02/25/2012  . Delayed menses 02/25/2012  .  Visit for preventive health examination 12/03/2011  . Abdominal bloating 12/03/2011  . DYSESTHESIA 09/20/2009  . ARREST OF BONE DEVELOPMENT OR GROWTH 07/24/2007  . DERMATOPHYTOSIS OF NAIL 07/10/2007  . ANEMIA-NOS 07/10/2007  . ALLERGIC RHINITIS 07/10/2007  . GERD 07/10/2007  . BLOOD IN STOOL 07/10/2007  .  HERPES LABIALIS, HX OF 07/10/2007   Starr Lake PT, DPT, LAT, ATC  03/20/16  5:32 PM      [facility] [address] [place], [place], [address] Phone: [phone]   Fax:  [phone]  Name: Valerie [name] MRN: [id number] Date of Birth: 1966-03-11

## 2016-03-25 ENCOUNTER — Ambulatory Visit: Payer: 59 | Admitting: Physical Therapy

## 2016-03-25 DIAGNOSIS — M62838 Other muscle spasm: Secondary | ICD-10-CM

## 2016-03-25 DIAGNOSIS — R293 Abnormal posture: Secondary | ICD-10-CM | POA: Diagnosis not present

## 2016-03-25 DIAGNOSIS — M6281 Muscle weakness (generalized): Secondary | ICD-10-CM

## 2016-03-25 DIAGNOSIS — M542 Cervicalgia: Secondary | ICD-10-CM | POA: Diagnosis not present

## 2016-03-25 NOTE — Therapy (Signed)
[place], [place], [address] Phone: [phone]   Fax:  [phone]  Physical Therapy Treatment  Patient Details  Name: Valerie [name] MRN: [id number] Date of Birth: Nov 13, 1965 Referring Provider: Hulan Saas DO  Encounter Date: 03/25/2016      PT End of Session - 03/25/16 1729    Visit Number 7   Number of Visits 13   Date for PT Re-Evaluation 04/15/16   Authorization Type [facility]   PT Start Time 1630   PT Stop Time 1716   PT Time Calculation (min) 46 min   Activity Tolerance Patient tolerated treatment well   Behavior During Therapy [facility] for tasks assessed/performed      Past Medical History:  Diagnosis Date  . Allergy    cats  . Anemia    NOS  . 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 GERD (gastroesophageal reflux disease)   . HSV infection    labialis  . RECTAL BLEEDING 07/10/2007   Qualifier: Diagnosis of  By: Regis Bill MD, Standley Brooking 2008 colonoscopy [facility].     Past Surgical History:  Procedure Laterality Date  . [place] RESECTION  08/27/2012   Procedure: DILATATION & CURETTAGE/HYSTEROSCOPY WITH VERSAPOINT RESECTION;  Surgeon: Princess Bruins, MD;  Location: [facility];  Service: Gynecology;;  . LASIK  2009   mono vision    There were no vitals filed for this visit.      Subjective Assessment - 03/25/16 1633    Subjective "I am doing pretty well today, seems like it is more in my neck today"    Currently in Pain? Yes   Pain Score 2    Pain Orientation Right;Left   Pain Descriptors / Indicators Aching   Pain Frequency Intermittent                         [facility] Adult PT Treatment/Exercise - 03/25/16 0001      Shoulder Exercises: Supine   Other Supine Exercises thoracic extension over bolster 2 x 10 with hands behind head and elbows adducted      Shoulder Exercises: Sidelying   Other Sidelying Exercises book  opening  2 x 10 L and R     Ultrasound   Ultrasound Location L upper Trap   Ultrasound Parameters 8 min. 7 w/cm2, 100%   Ultrasound Goals Pain;Other (Comment)  tighntess, perfomred stretches during treatment     Manual Therapy   Manual Therapy Joint mobilization   Joint Mobilization Grade 3 T1-T7 P>A mobs, Grade 3 C3-C7 P>A mobs   Soft tissue mobilization IASTM over upper thoracic parpspinals     Neck Exercises: Stretches   Upper Trapezius Stretch 3 reps;30 seconds  performed during [place] treatment                PT Education - 03/25/16 1731    Education provided Yes   Education Details updated HEP with proper form and treatment rationale   Person(s) Educated Patient   Methods Explanation;Verbal cues;Handout   Comprehension Verbalized understanding;Verbal cues required          PT Short Term Goals - 03/18/16 1647      PT SHORT TERM GOAL #1   Title pt will be I with inital HEP (02/22/2016)   Time 3   Period Weeks   Status Achieved     PT SHORT TERM GOAL #2   Title pt will  be able to verbalize and demonstrate techniques to reduce cervical pain and inflammation via RICE and HEP (02/22/2016)   Time 3   Period Weeks   Status Achieved           PT Long Term Goals - 03/18/16 1647      PT LONG TERM GOAL #1   Title pt will be I with all HEP given as of last visit (03/14/16)   Time 6   Period Weeks   Status On-going     PT LONG TERM GOAL #2   Title pt will demontrate decrease tightness of bil upper trap/ levator scapulae and surrounding muscualture to decrease pain to </= 2/10 (03/14/16)   Baseline 3-4/10   Time 6   Period Weeks   Status Partially Met     PT LONG TERM GOAL #3   Title pt will improve cervical mobility by >/= 5 degrees in all planes with </= 2/10 pain to assist with ADLs and promote safety during driving (6/83/41)   Baseline 3-4/10 pain met ROM part of goal   Time 6   Period Weeks   Status Partially Met     PT LONG TERM GOAL #4   Title pt  will be able to lift and carrying >/= 8# at shoulder heigh or height with one or both arms with </= 2/10 pain to assist with funcitonal lifting and carrying activities (03/14/16)   Baseline 3-4/10 pain but can lift 8# overhead   Time 6   Period Weeks   Status Partially Met     PT LONG TERM GOAL #5   Title pt will improve her FOTO score to >/= 70 to demonstrate improvement in function at discharge (03/14/16)   Baseline 54    Time 6   Period Weeks   Status On-going               Plan - 03/25/16 1729    Clinical Impression Statement Valerie [name] reports tightness in the upper thorcic spine today. Focused on manual and thoracic mobility today in combination with [place] on L upper trap to relieve tightness, post session she reported pain dropped to 0/10.    PT Next Visit Plan assess response to [place], manual to calm down tightnes, thoracic/ cerivcal mobs, scapular stability, Modaliities, ROM/ goals   Consulted and Agree with Plan of Care Patient      Patient will benefit from skilled therapeutic intervention in order to improve the following deficits and impairments:  Pain, Improper body mechanics, Postural dysfunction, Hypomobility, Decreased activity tolerance, Decreased endurance, Decreased range of motion, Decreased strength, Increased muscle spasms, Impaired UE functional use  Visit Diagnosis: Neck pain  Other muscle spasm  Abnormal posture  Muscle weakness (generalized)     Problem List Patient Active Problem List   Diagnosis Date Noted  . Trigger point of left shoulder region 09/21/2015  . Impingement syndrome of right shoulder 03/08/2015  . Trigger point of right shoulder region 03/08/2015  . Low back pain 03/16/2014  . Wrist pain, right 02/09/2014  . Nonallopathic lesion of cervical region 11/03/2013  . Nonallopathic lesion of thoracic region 11/03/2013  . Nonallopathic lesion-rib cage 11/03/2013  . Nonallopathic lesion of lumbosacral region 11/03/2013  . Nonallopathic  lesion of sacral region 11/03/2013  . Imbalance 10/27/2012  . Neck pain 10/27/2012  . Headache(784.0) 07/15/2012  . Abdominal cramps 02/25/2012  . Abnormal stools 02/25/2012  . Delayed menses 02/25/2012  . Visit for preventive health examination 12/03/2011  . Abdominal bloating  12/03/2011  . DYSESTHESIA 09/20/2009  . ARREST OF BONE DEVELOPMENT OR GROWTH 07/24/2007  . DERMATOPHYTOSIS OF NAIL 07/10/2007  . ANEMIA-NOS 07/10/2007  . ALLERGIC RHINITIS 07/10/2007  . GERD 07/10/2007  . BLOOD IN STOOL 07/10/2007  . HERPES LABIALIS, HX OF 07/10/2007   Starr Lake PT, DPT, LAT, ATC  03/25/16  5:33 PM      [place] [facility] [address] [place], [place], [address] Phone: [phone]   Fax:  [phone]  Name: Valerie [name] MRN: [id number] Date of Birth: 1965-10-26

## 2016-03-27 ENCOUNTER — Ambulatory Visit: Payer: 59 | Admitting: Physical Therapy

## 2016-03-27 DIAGNOSIS — M6281 Muscle weakness (generalized): Secondary | ICD-10-CM

## 2016-03-27 DIAGNOSIS — R293 Abnormal posture: Secondary | ICD-10-CM | POA: Diagnosis not present

## 2016-03-27 DIAGNOSIS — M62838 Other muscle spasm: Secondary | ICD-10-CM

## 2016-03-27 DIAGNOSIS — M542 Cervicalgia: Secondary | ICD-10-CM | POA: Diagnosis not present

## 2016-03-27 NOTE — Therapy (Signed)
[place], [place], [address] Phone: [phone]   Fax:  [phone]  Physical Therapy Treatment  Patient Details  Name: Valerie [name] MRN: [id number] Date of Birth: 04-12-1966 Referring Provider: Hulan Saas DO  Encounter Date: 03/27/2016      PT End of Session - 03/27/16 1728    Visit Number 8   Number of Visits 13   Date for PT Re-Evaluation 04/15/16   PT Start Time 6381   PT Stop Time 1725   PT Time Calculation (min) 54 min   Activity Tolerance Patient tolerated treatment well   Behavior During Therapy [facility] for tasks assessed/performed      Past Medical History:  Diagnosis Date  . Allergy    cats  . Anemia    NOS  . 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 GERD (gastroesophageal reflux disease)   . HSV infection    labialis  . RECTAL BLEEDING 07/10/2007   Qualifier: Diagnosis of  By: Regis Bill MD, Standley Brooking 2008 colonoscopy [facility].     Past Surgical History:  Procedure Laterality Date  . [place] RESECTION  08/27/2012   Procedure: DILATATION & CURETTAGE/HYSTEROSCOPY WITH VERSAPOINT RESECTION;  Surgeon: Princess Bruins, MD;  Location: [facility];  Service: Gynecology;;  . LASIK  2009   mono vision    There were no vitals filed for this visit.      Subjective Assessment - 03/27/16 1629    Subjective "The R is feeling alittle sore from some muscle spasm under the shoulder blade"    Currently in Pain? Yes   Pain Score 2    Pain Orientation Right;Left  R>L today   Pain Descriptors / Indicators Aching   Pain Type Chronic pain   Pain Onset More than a month ago   Pain Frequency Intermittent   Aggravating Factors  N/A   Pain Relieving Factors massage, Ice                         [facility] Adult PT Treatment/Exercise - 03/27/16 1638      Shoulder Exercises: ROM/Strengthening   UBE (Upper Arm Bike) L2 x 8 min  changing  direction at 4 min     Moist Heat Therapy   Number Minutes Moist Heat 10 Minutes   Moist Heat Location Shoulder;Other (comment)  thoracic spine in supine     Manual Therapy   Soft tissue mobilization IASTM over upper thoracic parpspinals   Scapular Mobilization grade 3 in all directions, empahsis on elevation in combination with upward rotatoin     Neck Exercises: Stretches   Upper Trapezius Stretch 2 reps;30 seconds          Trigger Point Dry Needling - 03/27/16 1726    Consent Given? Yes   Education Handout Provided No  given previoulsy   Muscles Treated Upper Body Rhomboids;Subscapularis   Rhomboids Response Twitch response elicited;Palpable increased muscle length   Subscapularis Response Twitch response elicited;Palpable increased muscle length                PT Short Term Goals - 03/18/16 1647      PT SHORT TERM GOAL #1   Title pt will be I with inital HEP (02/22/2016)   Time 3   Period Weeks   Status Achieved     PT SHORT TERM GOAL #2   Title pt will be able to verbalize and demonstrate  techniques to reduce cervical pain and inflammation via RICE and HEP (02/22/2016)   Time 3   Period Weeks   Status Achieved           PT Long Term Goals - 03/27/16 1727      PT LONG TERM GOAL #1   Title pt will be I with all HEP given as of last visit (03/14/16)   Time 6   Period Weeks   Status On-going     PT LONG TERM GOAL #2   Title pt will demontrate decrease tightness of bil upper trap/ levator scapulae and surrounding muscualture to decrease pain to </= 2/10 (03/14/16)   Time 6   Period Weeks   Status Partially Met     PT LONG TERM GOAL #3   Title pt will improve cervical mobility by >/= 5 degrees in all planes with </= 2/10 pain to assist with ADLs and promote safety during driving (3/81/01)   Time 6   Period Weeks   Status Partially Met     PT LONG TERM GOAL #4   Title pt will be able to lift and carrying >/= 8# at shoulder heigh or height with one  or both arms with </= 2/10 pain to assist with funcitonal lifting and carrying activities (03/14/16)   Time 6   Period Weeks   Status Partially Met     PT LONG TERM GOAL #5   Title pt will improve her FOTO score to >/= 70 to demonstrate improvement in function at discharge (03/14/16)   Time 6   Period Weeks   Status Unable to assess               Plan - 03/27/16 1729    Clinical Impression Statement Valerie [name] reports increased tightness in the R shoulder and R thoracic spine today. Focused on manaul to calm down tightness, and improve scapular mobility. DN was performed on R rhomboids, thoracic parapspinals at T7-10, and sub-scapularis; pt monitored throughout treatment.   PT Next Visit Plan assess response to DN, manual to calm down tightnes, thoracic/ cerivcal mobs, scapular stability, Modaliities, ROM/ goals   Consulted and Agree with Plan of Care Patient      Patient will benefit from skilled therapeutic intervention in order to improve the following deficits and impairments:  Pain, Improper body mechanics, Postural dysfunction, Hypomobility, Decreased activity tolerance, Decreased endurance, Decreased range of motion, Decreased strength, Increased muscle spasms, Impaired UE functional use  Visit Diagnosis: Neck pain  Other muscle spasm  Abnormal posture  Muscle weakness (generalized)     Problem List Patient Active Problem List   Diagnosis Date Noted  . Trigger point of left shoulder region 09/21/2015  . Impingement syndrome of right shoulder 03/08/2015  . Trigger point of right shoulder region 03/08/2015  . Low back pain 03/16/2014  . Wrist pain, right 02/09/2014  . Nonallopathic lesion of cervical region 11/03/2013  . Nonallopathic lesion of thoracic region 11/03/2013  . Nonallopathic lesion-rib cage 11/03/2013  . Nonallopathic lesion of lumbosacral region 11/03/2013  . Nonallopathic lesion of sacral region 11/03/2013  . Imbalance 10/27/2012  . Neck pain  10/27/2012  . Headache(784.0) 07/15/2012  . Abdominal cramps 02/25/2012  . Abnormal stools 02/25/2012  . Delayed menses 02/25/2012  . Visit for preventive health examination 12/03/2011  . Abdominal bloating 12/03/2011  . DYSESTHESIA 09/20/2009  . ARREST OF BONE DEVELOPMENT OR GROWTH 07/24/2007  . DERMATOPHYTOSIS OF NAIL 07/10/2007  . ANEMIA-NOS 07/10/2007  . ALLERGIC RHINITIS 07/10/2007  .  GERD 07/10/2007  . BLOOD IN STOOL 07/10/2007  . HERPES LABIALIS, HX OF 07/10/2007    Starr Lake 03/27/2016, 5:36 PM  [facility] [address] [place], [place], [address] Phone: [phone]   Fax:  [phone]  Name: Valerie [name] MRN: [id number] Date of Birth: 1966-06-19

## 2016-03-28 DIAGNOSIS — J3089 Other allergic rhinitis: Secondary | ICD-10-CM | POA: Diagnosis not present

## 2016-03-28 DIAGNOSIS — J3081 Allergic rhinitis due to animal (cat) (dog) hair and dander: Secondary | ICD-10-CM | POA: Diagnosis not present

## 2016-03-28 DIAGNOSIS — J301 Allergic rhinitis due to pollen: Secondary | ICD-10-CM | POA: Diagnosis not present

## 2016-04-01 ENCOUNTER — Ambulatory Visit: Payer: 59 | Admitting: Physical Therapy

## 2016-04-01 DIAGNOSIS — M6281 Muscle weakness (generalized): Secondary | ICD-10-CM | POA: Diagnosis not present

## 2016-04-01 DIAGNOSIS — M62838 Other muscle spasm: Secondary | ICD-10-CM | POA: Diagnosis not present

## 2016-04-01 DIAGNOSIS — R293 Abnormal posture: Secondary | ICD-10-CM

## 2016-04-01 DIAGNOSIS — M542 Cervicalgia: Secondary | ICD-10-CM

## 2016-04-01 NOTE — Patient Instructions (Addendum)
Sleeping on Back  Place pillow under knees. A pillow with cervical support and a roll around waist are also helpful. Copyright  VHI. All rights reserved.  Sleeping on Side Place pillow between knees. Use cervical support under neck and a roll around waist as needed. Copyright  VHI. All rights reserved.   Sleeping on Stomach   If this is the only desirable sleeping position, place pillow under lower legs, and under stomach or chest as needed.  Posture - Sitting   Sit upright, head facing forward. Try using a roll to support lower back. Keep shoulders relaxed, and avoid rounded back. Keep hips level with knees. Avoid crossing legs for long periods. Stand to Sit / Sit to Stand   To sit: Bend knees to lower self onto front edge of chair, then scoot back on seat. To stand: Reverse sequence by placing one foot forward, and scoot to front of seat. Use rocking motion to stand up.   Work Height and Reach  Ideal work height is no more than 2 to 4 inches below elbow level when standing, and at elbow level when sitting. Reaching should be limited to arm's length, with elbows slightly bent.  Bending  Bend at hips and knees, not back. Keep feet shoulder-width apart.    Posture - Standing   Good posture is important. Avoid slouching and forward head thrust. Maintain curve in low back and align ears over shoul- ders, hips over ankles.  Alternating Positions   Alternate tasks and change positions frequently to reduce fatigue and muscle tension. Take rest breaks. Computer Work   Position work to face forward. Use proper work and seat height. Keep shoulders back and down, wrists straight, and elbows at right angles. Use chair that provides full back support. Add footrest and lumbar roll as needed.  Getting Into / Out of Car  Lower self onto seat, scoot back, then bring in one leg at a time. Reverse sequence to get out.  Dressing  Lie on back to pull socks or slacks over feet, or sit  and bend leg while keeping back straight.    Housework - Sink  Place one foot on ledge of cabinet under sink when standing at sink for prolonged periods.   Pushing / Pulling  Pushing is preferable to pulling. Keep back in proper alignment, and use leg muscles to do the work.  Deep Squat   Squat and lift with both arms held against upper trunk. Tighten stomach muscles without holding breath. Use smooth movements to avoid jerking.  Avoid Twisting   Avoid twisting or bending back. Pivot around using foot movements, and bend at knees if needed when reaching for articles.  Carrying Luggage   Distribute weight evenly on both sides. Use a cart whenever possible. Do not twist trunk. Move body as a unit.   Lifting Principles .Maintain proper posture and head alignment. .Slide object as close as possible before lifting. .Move obstacles out of the way. .Test before lifting; ask for help if too heavy. .Tighten stomach muscles without holding breath. .Use smooth movements; do not jerk. .Use legs to do the work, and pivot with feet. .Distribute the work load symmetrically and close to the center of trunk. .Push instead of pull whenever possible.   Ask For Help   Ask for help and delegate to others when possible. Coordinate your movements when lifting together, and maintain the low back curve.  Log Roll   Lying on back, bend left knee and place left   arm across chest. Roll all in one movement to the right. Reverse to roll to the left. Always move as one unit. Housework - Sweeping  Use long-handled equipment to avoid stooping.   Housework - Wiping  Position yourself as close as possible to reach work surface. Avoid straining your back.  Laundry - Unloading Wash   To unload small items at bottom of washer, lift leg opposite to arm being used to reach.  Gardening - Raking  Move close to area to be raked. Use arm movements to do the work. Keep back straight and avoid  twisting.     Cart  When reaching into cart with one arm, lift opposite leg to keep back straight.   Getting Into / Out of Bed  Lower self to lie down on one side by raising legs and lowering head at the same time. Use arms to assist moving without twisting. Bend both knees to roll onto back if desired. To sit up, start from lying on side, and use same move-ments in reverse. Housework - Vacuuming  Hold the vacuum with arm held at side. Step back and forth to move it, keeping head up. Avoid twisting.   Laundry - Armed forces training and education officerLoading Wash  Position laundry basket so that bending and twisting can be avoided.   Laundry - Unloading Dryer  Squat down to reach into clothes dryer or use a reacher.  Gardening - Weeding / Psychiatric nurselanting  Squat or Kneel. Knee pads may be helpful.                  Over Head Pull: Narrow Grip       On back, knees bent, feet flat, band across thighs, elbows straight but relaxed. Pull hands apart (start). Keeping elbows straight, bring arms up and over head, hands toward floor. Keep pull steady on band. Hold momentarily. Return slowly, keeping pull steady, back to start. Repeat _10__ times. Band color ___Green___   Side Pull: Double Arm   On back, knees bent, feet flat. Arms perpendicular to body, shoulder level, elbows straight but relaxed. Pull arms out to sides, elbows straight. Resistance band comes across collarbones, hands toward floor. Hold momentarily. Slowly return to starting position. Repeat10___ times. Band color __green___   Sash   On back, knees bent, feet flat, left hand on left hip, right hand above left. Pull right arm DIAGONALLY (hip to shoulder) across chest. Bring right arm along head toward floor. Hold momentarily. Slowly return to starting position. Repeat _10__ times. Do with left arm. Band color __green____   Shoulder Rotation: Double Arm   On back, knees bent, feet flat, elbows tucked at sides, bent 90, hands palms up. Pull hands  apart and down toward floor, keeping elbows near sides. Hold momentarily. Slowly return to starting position. Repeat __10 times. Band color ___green___

## 2016-04-01 NOTE — Therapy (Signed)
[facility] [address] [place], [place], [address] Phone: [phone]   Fax:  [phone]  Physical Therapy Treatment  Patient Details  Name: Valerie [name] MRN: [id number] Date of Birth: October 26, 1965 Referring Provider: Antoine Primas DO  Encounter Date: 04/01/2016      PT End of Session - 04/01/16 1718    Visit Number 9   Number of Visits 13   Date for PT Re-Evaluation 04/15/16   PT Start Time 1635   PT Stop Time 1725   PT Time Calculation (min) 50 min   Activity Tolerance Patient tolerated treatment well   Behavior During Therapy [facility] for tasks assessed/performed      Past Medical History:  Diagnosis Date  . Allergy    cats  . Anemia    NOS  . 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 GERD (gastroesophageal reflux disease)   . HSV infection    labialis  . RECTAL BLEEDING 07/10/2007   Qualifier: Diagnosis of  By: Fabian Sharp MD, Neta Mends 2008 colonoscopy [facility].     Past Surgical History:  Procedure Laterality Date  . DILITATION & CURRETTAGE/HYSTROSCOPY WITH VERSAPOINT RESECTION  08/27/2012   Procedure: DILATATION & CURETTAGE/HYSTEROSCOPY WITH VERSAPOINT RESECTION;  Surgeon: Genia Del, MD;  Location: [facility];  Service: Gynecology;;  . LASIK  2009   mono vision    There were no vitals filed for this visit.      Subjective Assessment - 04/01/16 1637    Subjective Lt shoulder 2/10,  RT 3/10 (gardening)  Lower trap scapular 3/10.  Doing her stretches with back and door.     Pain Location --  upper traps    Pain Orientation Right;Left   Pain Radiating Towards into shoulder blades RT   Pain Frequency Intermittent   Aggravating Factors  gardening,  stretching   Pain Relieving Factors DN,  massage Ice,  heat,  [place]   Multiple Pain Sites --  shoulder Right as above.                          [facility] Adult PT Treatment/Exercise - 04/01/16 0001      Shoulder Exercises: Supine   Other Supine  Exercises narrow grip 10 X shoulder flexion  Neck roll used     Moist Heat Therapy   Number Minutes Moist Heat 15 Minutes   Moist Heat Location --  shoulder upper trap     Manual Therapy   Manual Therapy Soft tissue mobilization   Manual therapy comments trigger point release,  strumming passive stretch to trap/ anterior shoulder.  instrument assist intermittantly.  Upper back paraspinals, upper traps, rhomboids.   less tightness noted by patient post manual                PT Education - 04/01/16 1643    Education provided Yes   Education Details Posture ADL handout,  Briefly reviewed.  supine scapular stabilization series , green band   Person(s) Educated Patient   Methods Handout   Comprehension Verbalized understanding;Returned demonstration;Need further instruction          PT Short Term Goals - 03/18/16 1647      PT SHORT TERM GOAL #1   Title pt will be I with inital HEP (02/22/2016)   Time 3   Period Weeks   Status Achieved     PT SHORT TERM GOAL #2   Title pt will be able to  verbalize and demonstrate techniques to reduce cervical pain and inflammation via RICE and HEP (02/22/2016)   Time 3   Period Weeks   Status Achieved           PT Long Term Goals - 04/01/16 1722      PT LONG TERM GOAL #1   Title pt will be I with all HEP given as of last visit (03/14/16)   Time 6   Period Weeks   Status On-going     PT LONG TERM GOAL #2   Baseline 2-3   Time 6   Period Weeks   Status On-going     PT LONG TERM GOAL #3   Title pt will improve cervical mobility by >/= 5 degrees in all planes with </= 2/10 pain to assist with ADLs and promote safety during driving (8/11/917/27/17)   Time 6   Period Weeks   Status Unable to assess     PT LONG TERM GOAL #4   Title pt will be able to lift and carrying >/= 8# at shoulder heigh or height with one or both arms with </= 2/10 pain to assist with funcitonal lifting and carrying activities (03/14/16)   Time 6   Status  Unable to assess     PT LONG TERM GOAL #5   Title pt will improve her FOTO score to >/= 70 to demonstrate improvement in function at discharge (03/14/16)   Time 6   Period Weeks   Status Unable to assess               Plan - 04/01/16 1719    Clinical Impression Statement Progress toward home exercose goals.  She was able to do some yardwork useing her arms intermittantly for 2 hours.  Les tightness noted post manual, DN has been helpful.  ADL handout issued in hopes there is somerthing in her ADL's she can change to decrease stressw.    PT Next Visit Plan review scapular stabilization bands   PT Home Exercise Plan scapular stabilization series, green ban to do opposite days of ITY's.     Consulted and Agree with Plan of Care Patient      Patient will benefit from skilled therapeutic intervention in order to improve the following deficits and impairments:  Pain, Improper body mechanics, Postural dysfunction, Hypomobility, Decreased activity tolerance, Decreased endurance, Decreased range of motion, Decreased strength, Increased muscle spasms, Impaired UE functional use  Visit Diagnosis: Neck pain  Other muscle spasm  Abnormal posture  Muscle weakness (generalized)     Problem List Patient Active Problem List   Diagnosis Date Noted  . Trigger point of left shoulder region 09/21/2015  . Impingement syndrome of right shoulder 03/08/2015  . Trigger point of right shoulder region 03/08/2015  . Low back pain 03/16/2014  . Wrist pain, right 02/09/2014  . Nonallopathic lesion of cervical region 11/03/2013  . Nonallopathic lesion of thoracic region 11/03/2013  . Nonallopathic lesion-rib cage 11/03/2013  . Nonallopathic lesion of lumbosacral region 11/03/2013  . Nonallopathic lesion of sacral region 11/03/2013  . Imbalance 10/27/2012  . Neck pain 10/27/2012  . Headache(784.0) 07/15/2012  . Abdominal cramps 02/25/2012  . Abnormal stools 02/25/2012  . Delayed menses  02/25/2012  . Visit for preventive health examination 12/03/2011  . Abdominal bloating 12/03/2011  . DYSESTHESIA 09/20/2009  . ARREST OF BONE DEVELOPMENT OR GROWTH 07/24/2007  . DERMATOPHYTOSIS OF NAIL 07/10/2007  . ANEMIA-NOS 07/10/2007  . ALLERGIC RHINITIS 07/10/2007  . GERD 07/10/2007  .  BLOOD IN STOOL 07/10/2007  . HERPES LABIALIS, HX OF 07/10/2007    [facility] Center[facility]IS,KAREN 04/01/2016, 5:24 PM  [facility] [address] [place], [place], [address] Phone: [phone]   Fax:  [phone]  Name: Valerie Creeamela S [name] MRN: [id number] Date of Birth: October 20, 1965   Liz BeachKaren Harris, PTA 04/01/16 5:24 PM Phone: [phone] Fax: [phone]

## 2016-04-03 ENCOUNTER — Encounter: Payer: Self-pay | Admitting: Family Medicine

## 2016-04-03 ENCOUNTER — Ambulatory Visit (INDEPENDENT_AMBULATORY_CARE_PROVIDER_SITE_OTHER): Payer: 59 | Admitting: Family Medicine

## 2016-04-03 VITALS — BP 118/82 | HR 68 | Wt 128.0 lb

## 2016-04-03 DIAGNOSIS — M542 Cervicalgia: Secondary | ICD-10-CM | POA: Diagnosis not present

## 2016-04-03 DIAGNOSIS — M9904 Segmental and somatic dysfunction of sacral region: Secondary | ICD-10-CM | POA: Diagnosis not present

## 2016-04-03 DIAGNOSIS — M9903 Segmental and somatic dysfunction of lumbar region: Secondary | ICD-10-CM | POA: Diagnosis not present

## 2016-04-03 DIAGNOSIS — M9901 Segmental and somatic dysfunction of cervical region: Secondary | ICD-10-CM | POA: Diagnosis not present

## 2016-04-03 DIAGNOSIS — M999 Biomechanical lesion, unspecified: Secondary | ICD-10-CM

## 2016-04-03 DIAGNOSIS — M9902 Segmental and somatic dysfunction of thoracic region: Secondary | ICD-10-CM

## 2016-04-03 NOTE — Progress Notes (Signed)
Tawana ScaleZach Smith D.O. [facility] [address] [place], [place] [address] Phone: [phone] Subjective:    CC: Neck and back pain follow up  BJY:NWGNFAOZHYHPI:Subjective  Valerie [name] is a 50 y.o. female coming in with complaint of neck and back pain. We have been seen patient for quite some time. Patient though recently has been in formal physical therapy on a much more regular basis. Patient has been doing the home exercises. Patient states they have been focusing on ergonomics at work as well as proper lifting techniques. Patient has been noticing improvement. Denies some mild increase in pain when working in the yard.    Previous x-rays of cervical spine were independently visualized by me showing no significant bony normality.  Past Medical History:  Diagnosis Date  . Allergy    cats  . Anemia    NOS  . 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 GERD (gastroesophageal reflux disease)   . HSV infection    labialis  . RECTAL BLEEDING 07/10/2007   Qualifier: Diagnosis of  By: [name], Neta MendsWanda K 2008 colonoscopy [facility].    Past Surgical History:  Procedure Laterality Date  . DILITATION & CURRETTAGE/HYSTROSCOPY WITH VERSAPOINT RESECTION  08/27/2012   Procedure: DILATATION & CURETTAGE/HYSTEROSCOPY WITH VERSAPOINT RESECTION;  Surgeon: Genia DelMarie-Lyne Lavoie, MD;  Location: [facility];  Service: Gynecology;;  . LASIK  2009   mono vision   Social History   Social History  . Marital status: Married    Spouse name: N/A  . Number of children: N/A  . Years of education: N/A   Occupational History  . PA [employer]   Social History Main Topics  . Smoking status: Never Smoker  . Smokeless tobacco: Never Used  . Alcohol use 0.0 oz/week     Comment: occasional  . Drug use: No  . Sexual activity: Not on file   Other Topics Concern  . Not on file   Social History Narrative   Occupation: PA at [employer]   Divorced - remarried   Regular exercise - yes   Cat exposure  BF   [place]   hhof 2 pet cat   Allergies  Allergen Reactions  . Augmentin [Amoxicillin-Pot Clavulanate] Diarrhea  . Ceftin [Cefuroxime Axetil] Other (See Comments)    Severe yeast infection  . Clarithromycin Other (See Comments)    Significant metallic taste  . Nsaids Nausea Only and Other (See Comments)    Abdominal pain / severe reflux     Family History  Problem Relation Age of Onset  . Colon polyps Brother     elev TG  . Alcohol abuse Other   . Breast cancer Maternal Aunt   . Diabetes Father   . Hyperlipidemia Father   . Heart disease Father   . Stroke Father   . Stroke Maternal Grandmother   . Diabetes Maternal Grandmother   . Heart disease Maternal Grandmother   . Diabetes Maternal Grandfather   . Hyperlipidemia Brother     elevated triglycerides  . Liver disease Maternal Uncle     alcohol related  . Colon cancer Neg Hx       Review of Systems: No visual changes, nausea, vomiting, diarrhea, constipation, dizziness, abdominal pain, skin rash, fevers, chills, night sweats, weight loss, swollen lymph nodes, chest pain, shortness of breath, mood changes.   Objective  Last menstrual period 11/28/2014.  General: No apparent distress  alert and oriented x3 mood and affect normal, dressed appropriately.  HEENT: Pupils equal, extraocular movements intact  Respiratory: Patient's speak in full sentences and does not appear short of breath  Cardiovascular: No lower extremity edema, non tender, no erythema  Skin: Warm dry intact with no signs of infection or rash on extremities or on axial skeleton.  Abdomen: Soft nontender  Neuro: Cranial nerves II through XII are intact, neurovascularly intact in all extremities with 2+ DTRs and 2+ pulses.  Lymph: No lymphadenopathy of posterior or anterior cervical chain or axillae bilaterally.  Gait normal with good balance and coordination.  MSK:  Non tender with full range of motion and good stability and  symmetric strength and tone of Shoulder, elbows, wrist, hip, knee and ankles bilaterally.     Neck: Inspection unremarkable. No palpable stepoff Continue mild positive Spurling's on the left side No sensory change to C4 to T1 Negative Hoffman sign bilaterally Reflexes normal No crepitus noted.  Back Exam:  Inspection: Unremarkable  Motion: Flexion 45 deg improved, Extension 35 deg, Side Bending to 35 deg bilaterally,  Rotation to 35 deg bilaterally  SLR laying: Negative  XSLR laying: Negative  FABER: negative. Sensory change: Gross sensation intact to all lumbar and sacral dermatomes.  Reflexes: 2+ at both patellar tendons, 2+ at achilles tendons, Babinski's downgoing.  Strength at foot  Plantar-flexion: 5/5 Dorsi-flexion: 5/5 Eversion: 5/5 Inversion: 5/5  Leg strength  Quad: 5/5 Hamstring: 5/5 Hip flexor: 5/5 Hip abductors: 4/5  Gait unremarkable.   OMT Physical Exam Cervical  C2 flexed rotated and side bent left   Thoracic T1 extended rotated and side bent left with elevated first rib T6 extended rotated and side bent left  Lumbar L2 flexed rotated and side bent left L4 flexed rotated and side bent left Sacrum Left on left       Impression and Recommendations:     This case required medical decision making of moderate complexity.

## 2016-04-03 NOTE — Assessment & Plan Note (Signed)
Decision today to treat with OMT was based on Physical Exam  After verbal consent patient was treated with HVLA, ME, FPR techniques in cervical, rib, thoracic, lumbar and sacral areas more muscle energy within the cervical spine.  Patient tolerated the procedure well with improvement in symptoms  Patient given exercises, stretches and lifestyle modifications  See medications in patient instructions if given  Patient will follow up in 4-8 weeks

## 2016-04-03 NOTE — Assessment & Plan Note (Signed)
Still seems to be multifactorial. I do believe that ergonomics, poor posture, scapular instability, as well as stress all planes overall. Patient's x-rays previously did not show any significant bony abnormality. Patient seems to be doing relatively well. Patient has responded well to osteopathic manipular relation. Then did believe that we need to see her in 4-8 weeks.

## 2016-04-03 NOTE — Patient Instructions (Signed)
Good to see you as always.  Thank you again for the book.  You are doing great  Finish up with PT Keep working hard and remember posture See me again 5-6 weeks!

## 2016-04-15 ENCOUNTER — Ambulatory Visit: Payer: 59 | Admitting: Physical Therapy

## 2016-04-15 DIAGNOSIS — M6281 Muscle weakness (generalized): Secondary | ICD-10-CM

## 2016-04-15 DIAGNOSIS — M542 Cervicalgia: Secondary | ICD-10-CM | POA: Diagnosis not present

## 2016-04-15 DIAGNOSIS — M62838 Other muscle spasm: Secondary | ICD-10-CM

## 2016-04-15 DIAGNOSIS — R293 Abnormal posture: Secondary | ICD-10-CM

## 2016-04-15 NOTE — Therapy (Signed)
[place], [place], [address] Phone: [phone]   Fax:  [phone]  Physical Therapy Treatment / Re-certification Note  Patient Details  Name: Valerie [name] MRN: [id number] Date of Birth: 1966/04/08 Referring Provider: Hulan Saas DO  Encounter Date: 04/15/2016      PT End of Session - 04/15/16 1642    Visit Number 10   Number of Visits 20   Date for PT Re-Evaluation 05/20/16   Authorization Type [facility] UMR   PT Start Time 6378   PT Stop Time 1728   PT Time Calculation (min) 53 min   Activity Tolerance Patient tolerated treatment well   Behavior During Therapy [facility] for tasks assessed/performed      Past Medical History:  Diagnosis Date  . Allergy    cats  . Anemia    NOS  . 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 GERD (gastroesophageal reflux disease)   . HSV infection    labialis  . RECTAL BLEEDING 07/10/2007   Qualifier: Diagnosis of  By: Regis Bill MD, Standley Brooking 2008 colonoscopy [facility].     Past Surgical History:  Procedure Laterality Date  . [place] RESECTION  08/27/2012   Procedure: DILATATION & CURETTAGE/HYSTEROSCOPY WITH VERSAPOINT RESECTION;  Surgeon: Princess Bruins, MD;  Location: [facility];  Service: Gynecology;;  . LASIK  2009   mono vision    There were no vitals filed for this visit.      Subjective Assessment - 04/15/16 1636    Subjective "I'm doing alright, pain seems to be minimal, went away to a conference it was nice"   Currently in Pain? Yes   Pain Score 1    Pain Orientation Right   Pain Descriptors / Indicators Sore   Pain Type Chronic pain   Pain Onset More than a month ago   Pain Frequency Intermittent   Aggravating Factors  cervical extension,    Pain Relieving Factors heat/ Ice, stretching,             [facility] PT Assessment - 04/15/16 1647      Observation/Other Assessments   Focus on Therapeutic  Outcomes (FOTO)  29% limited     AROM   Cervical Flexion 46  stiffness at end range   Cervical Extension 48  stiffness   Cervical - Right Side Bend 50   Cervical - Left Side Bend 58   Cervical - Right Rotation 72   Cervical - Left Rotation 75     Strength   Right Shoulder Flexion 4+/5   Right Shoulder Extension 4+/5   Right Shoulder ABduction 4/5  discomfort during testing   Left Shoulder Flexion 4+/5   Left Shoulder Extension 4+/5   Left Shoulder ABduction 4/5  discomfort                      [facility] Adult PT Treatment/Exercise - 04/15/16 0001      Modalities   Modalities Iontophoresis     Moist Heat Therapy   Number Minutes Moist Heat 10 Minutes   Moist Heat Location Shoulder  L shoulder     Iontophoresis   Type of Iontophoresis Dexamethasone   Location R shoulder sub-acromial region   Dose 1 cc   Time 6 hour stat patch     Manual Therapy   Soft tissue mobilization IASTM over L upper trap/ levator scapulae   Myofascial Release fascial stretching/ rolling over the  L upper trap and levator scapulae                PT Education - 04/15/16 0175    Education provided Yes   Education Details reviewed HEP, discussed scapular winging, iontophoresis benefits for inflammation and pain, what to expect.    Person(s) Educated Patient   Methods Explanation;Verbal cues   Comprehension Verbalized understanding          PT Short Term Goals - 03/18/16 1647      PT SHORT TERM GOAL #1   Title pt will be I with inital HEP (02/22/2016)   Time 3   Period Weeks   Status Achieved     PT SHORT TERM GOAL #2   Title pt will be able to verbalize and demonstrate techniques to reduce cervical pain and inflammation via RICE and HEP (02/22/2016)   Time 3   Period Weeks   Status Achieved           PT Long Term Goals - 04/15/16 1751      PT LONG TERM GOAL #1   Title pt will be I with all HEP given as of last visit (03/14/16)   Time 6   Period Weeks    Status On-going     PT LONG TERM GOAL #2   Title pt will demontrate decrease tightness of bil upper trap/ levator scapulae and surrounding muscualture to decrease pain to </= 2/10 (03/14/16)   Time 6   Period Weeks   Status Partially Met     PT LONG TERM GOAL #3   Title pt will improve cervical mobility by >/= 5 degrees in all planes with </= 2/10 pain to assist with ADLs and promote safety during driving (08/20/56)   Time 6   Period Weeks   Status Partially Met     PT LONG TERM GOAL #4   Title pt will be able to lift and carrying >/= 8# at shoulder heigh or height with one or both arms with </= 2/10 pain to assist with funcitonal lifting and carrying activities (03/14/16)   Time 6   Period Weeks   Status Partially Met     PT LONG TERM GOAL #5   Title pt will improve her FOTO score to >/= 70 to demonstrate improvement in function at discharge (03/14/16)   Time 6   Period Weeks   Status Achieved               Plan - 04/15/16 1748    Clinical Impression Statement Mrs. Cordell continues to progress with physical therapy with decreased pain in the cervical spine, but continues to demonstrate tightness in bil upper traps L >R  and winging of the R scapula with pain and weakness noted with R shoulder external rotatoin testing. DN was performed on her L upper trap. Iontophoresis placed on the R shoulder to relieve pain and soreness. plan to conitnue with current POC to work toward remaining goals and independent exercise.    Rehab Potential Good   PT Frequency 2x / week   PT Duration 6 weeks   PT Treatment/Interventions ADLs/Self Care Home Management;Cryotherapy;Electrical Stimulation;Iontophoresis 61m/ml Dexamethasone;Moist Heat;Therapeutic exercise;Therapeutic activities;Vasopneumatic Device;Manual techniques;Dry needling;Ultrasound;Patient/family education;Passive range of motion;Taping   PT Next Visit Plan assess response to DN ,review scapular stabilization bands, R scapular mobility  and GH rhythm (upward rotation)   PT Home Exercise Plan reviewed HEP and conitnue   Consulted and Agree with Plan of Care Patient  Patient will benefit from skilled therapeutic intervention in order to improve the following deficits and impairments:  Pain, Improper body mechanics, Postural dysfunction, Hypomobility, Decreased activity tolerance, Decreased endurance, Decreased range of motion, Decreased strength, Increased muscle spasms, Impaired UE functional use  Visit Diagnosis: Neck pain - Plan: PT plan of care cert/re-cert  Other muscle spasm - Plan: PT plan of care cert/re-cert  Abnormal posture - Plan: PT plan of care cert/re-cert  Muscle weakness (generalized) - Plan: PT plan of care cert/re-cert     Problem List Patient Active Problem List   Diagnosis Date Noted  . Trigger point of left shoulder region 09/21/2015  . Impingement syndrome of right shoulder 03/08/2015  . Trigger point of right shoulder region 03/08/2015  . Low back pain 03/16/2014  . Wrist pain, right 02/09/2014  . Nonallopathic lesion of cervical region 11/03/2013  . Nonallopathic lesion of thoracic region 11/03/2013  . Nonallopathic lesion-rib cage 11/03/2013  . Nonallopathic lesion of lumbosacral region 11/03/2013  . Nonallopathic lesion of sacral region 11/03/2013  . Imbalance 10/27/2012  . Neck pain 10/27/2012  . Headache(784.0) 07/15/2012  . Abdominal cramps 02/25/2012  . Abnormal stools 02/25/2012  . Delayed menses 02/25/2012  . Visit for preventive health examination 12/03/2011  . Abdominal bloating 12/03/2011  . DYSESTHESIA 09/20/2009  . ARREST OF BONE DEVELOPMENT OR GROWTH 07/24/2007  . DERMATOPHYTOSIS OF NAIL 07/10/2007  . ANEMIA-NOS 07/10/2007  . ALLERGIC RHINITIS 07/10/2007  . GERD 07/10/2007  . BLOOD IN STOOL 07/10/2007  . HERPES LABIALIS, HX OF 07/10/2007   Starr Lake PT, DPT, LAT, ATC  04/15/16  5:57 PM      [facility] [address] [place], [place], [address] Phone: [phone]   Fax:  [phone]  Name: AYSIA LOWDER MRN: [id number] Date of Birth: 1966/05/22

## 2016-04-17 ENCOUNTER — Ambulatory Visit: Payer: 59 | Admitting: Physical Therapy

## 2016-04-17 DIAGNOSIS — M6281 Muscle weakness (generalized): Secondary | ICD-10-CM | POA: Diagnosis not present

## 2016-04-17 DIAGNOSIS — R293 Abnormal posture: Secondary | ICD-10-CM | POA: Diagnosis not present

## 2016-04-17 DIAGNOSIS — M62838 Other muscle spasm: Secondary | ICD-10-CM | POA: Diagnosis not present

## 2016-04-17 DIAGNOSIS — M542 Cervicalgia: Secondary | ICD-10-CM | POA: Diagnosis not present

## 2016-04-17 NOTE — Therapy (Signed)
[facility] [place], [place], [address] Phone: [phone]   Fax:  [phone]  Physical Therapy Treatment  Patient Details  Name: Valerie [name] MRN: [id number] Date of Birth: 10/28/1965 Referring Provider: Hulan Saas DO  Encounter Date: 04/17/2016      PT End of Session - 04/17/16 1737    Visit Number 11   Number of Visits 20   Date for PT Re-Evaluation 05/20/16   PT Start Time 1630   PT Stop Time 1720   PT Time Calculation (min) 50 min   Activity Tolerance Patient tolerated treatment well   Behavior During Therapy [facility] for tasks assessed/performed      Past Medical History:  Diagnosis Date  . Allergy    cats  . Anemia    NOS  . 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 GERD (gastroesophageal reflux disease)   . HSV infection    labialis  . RECTAL BLEEDING 07/10/2007   Qualifier: Diagnosis of  By: Regis Bill MD, Standley Brooking 2008 colonoscopy [facility].     Past Surgical History:  Procedure Laterality Date  . [place] RESECTION  08/27/2012   Procedure: DILATATION & CURETTAGE/HYSTEROSCOPY WITH VERSAPOINT RESECTION;  Surgeon: Princess Bruins, MD;  Location: [facility];  Service: Gynecology;;  . LASIK  2009   mono vision    There were no vitals filed for this visit.      Subjective Assessment - 04/17/16 1639    Subjective "I am doing alright,   Currently in Pain? Yes   Pain Score 1    Pain Location Neck   Pain Orientation Left;Right   Pain Descriptors / Indicators Sore   Pain Type Chronic pain   Pain Onset More than a month ago   Pain Frequency Intermittent                         [facility] Adult PT Treatment/Exercise - 04/17/16 1641      Shoulder Exercises: ROM/Strengthening   UBE (Upper Arm Bike) L2 x 8 min  changning position at 4 min     Ultrasound   Ultrasound Location L upper trap   Ultrasound Parameters 100%, 26mz, 1.0 w/cm2 x  8 min  performing intermittent upper trap stretch during tx   Ultrasound Goals Pain  muscle tightness     Iontophoresis   Type of Iontophoresis Dexamethasone   Location R shoulder sub-acromial region   Dose 1 cc   Time 6 hour stat patch     Manual Therapy   Joint Mobilization Grade 3 T1-T7 P>A mobs, Grade 3 C3-C7 P>A mobs   Soft tissue mobilization IASTM over L upper trap/ levator scapulae   Myofascial Release fascial stretching/ rolling over the L upper trap and levator scapulae   Scapular Mobilization upward rotation scapular assist with pt performing flexion / abduction 2 x 10      Neck Exercises: Stretches   Upper Trapezius Stretch 4 reps;30 seconds                  PT Short Term Goals - 03/18/16 1647      PT SHORT TERM GOAL #1   Title pt will be I with inital HEP (02/22/2016)   Time 3   Period Weeks   Status Achieved     PT SHORT TERM GOAL #2   Title pt will be able to verbalize and demonstrate techniques to reduce cervical  pain and inflammation via RICE and HEP (02/22/2016)   Time 3   Period Weeks   Status Achieved           PT Long Term Goals - 04/15/16 1751      PT LONG TERM GOAL #1   Title pt will be I with all HEP given as of last visit (03/14/16)   Time 6   Period Weeks   Status On-going     PT LONG TERM GOAL #2   Title pt will demontrate decrease tightness of bil upper trap/ levator scapulae and surrounding muscualture to decrease pain to </= 2/10 (03/14/16)   Time 6   Period Weeks   Status Partially Met     PT LONG TERM GOAL #3   Title pt will improve cervical mobility by >/= 5 degrees in all planes with </= 2/10 pain to assist with ADLs and promote safety during driving (11/17/00)   Time 6   Period Weeks   Status Partially Met     PT LONG TERM GOAL #4   Title pt will be able to lift and carrying >/= 8# at shoulder heigh or height with one or both arms with </= 2/10 pain to assist with funcitonal lifting and carrying activities (03/14/16)    Time 6   Period Weeks   Status Partially Met     PT LONG TERM GOAL #5   Title pt will improve her FOTO score to >/= 70 to demonstrate improvement in function at discharge (03/14/16)   Time 6   Period Weeks   Status Achieved               Plan - 04/17/16 1738    Clinical Impression Statement Mrs. Montelongo states she had some soreness in the L shoulder from DN but reports significant relief of tightness. following manual to improve thoracic mobility and R scapular mobility she reported no pain. conintued used of Ionto patch to assist with pain relief.    PT Next Visit Plan ,review scapular stabilization bands, R scapular mobility and GH rhythm (upward rotation), thoracic mobility, update HEP for thoracic mobs   Consulted and Agree with Plan of Care Patient      Patient will benefit from skilled therapeutic intervention in order to improve the following deficits and impairments:  Pain, Improper body mechanics, Postural dysfunction, Hypomobility, Decreased activity tolerance, Decreased endurance, Decreased range of motion, Decreased strength, Increased muscle spasms, Impaired UE functional use  Visit Diagnosis: Neck pain  Other muscle spasm  Abnormal posture  Muscle weakness (generalized)     Problem List Patient Active Problem List   Diagnosis Date Noted  . Trigger point of left shoulder region 09/21/2015  . Impingement syndrome of right shoulder 03/08/2015  . Trigger point of right shoulder region 03/08/2015  . Low back pain 03/16/2014  . Wrist pain, right 02/09/2014  . Nonallopathic lesion of cervical region 11/03/2013  . Nonallopathic lesion of thoracic region 11/03/2013  . Nonallopathic lesion-rib cage 11/03/2013  . Nonallopathic lesion of lumbosacral region 11/03/2013  . Nonallopathic lesion of sacral region 11/03/2013  . Imbalance 10/27/2012  . Neck pain 10/27/2012  . Headache(784.0) 07/15/2012  . Abdominal cramps 02/25/2012  . Abnormal stools 02/25/2012  .  Delayed menses 02/25/2012  . Visit for preventive health examination 12/03/2011  . Abdominal bloating 12/03/2011  . DYSESTHESIA 09/20/2009  . ARREST OF BONE DEVELOPMENT OR GROWTH 07/24/2007  . DERMATOPHYTOSIS OF NAIL 07/10/2007  . ANEMIA-NOS 07/10/2007  . ALLERGIC RHINITIS 07/10/2007  .  GERD 07/10/2007  . BLOOD IN STOOL 07/10/2007  . HERPES LABIALIS, HX OF 07/10/2007   Starr Lake PT, DPT, LAT, ATC  04/17/16  5:45 PM      [facility] [address] [place], [place], [address] Phone: [phone]   Fax:  [phone]  Name: Valerie [name] MRN: [id number] Date of Birth: 11/09/1965

## 2016-04-18 DIAGNOSIS — H2513 Age-related nuclear cataract, bilateral: Secondary | ICD-10-CM | POA: Diagnosis not present

## 2016-04-23 DIAGNOSIS — J3081 Allergic rhinitis due to animal (cat) (dog) hair and dander: Secondary | ICD-10-CM | POA: Diagnosis not present

## 2016-04-23 DIAGNOSIS — J3089 Other allergic rhinitis: Secondary | ICD-10-CM | POA: Diagnosis not present

## 2016-04-23 DIAGNOSIS — J301 Allergic rhinitis due to pollen: Secondary | ICD-10-CM | POA: Diagnosis not present

## 2016-05-02 ENCOUNTER — Ambulatory Visit: Payer: 59 | Attending: Family Medicine | Admitting: Physical Therapy

## 2016-05-02 DIAGNOSIS — R293 Abnormal posture: Secondary | ICD-10-CM | POA: Insufficient documentation

## 2016-05-02 DIAGNOSIS — M62838 Other muscle spasm: Secondary | ICD-10-CM | POA: Insufficient documentation

## 2016-05-02 DIAGNOSIS — M542 Cervicalgia: Secondary | ICD-10-CM | POA: Insufficient documentation

## 2016-05-02 DIAGNOSIS — M6281 Muscle weakness (generalized): Secondary | ICD-10-CM | POA: Diagnosis not present

## 2016-05-02 NOTE — Therapy (Signed)
[place], [place], [address] Phone: [phone]   Fax:  [phone]  Physical Therapy Treatment  Patient Details  Name: Valerie [name] MRN: [id number] Date of Birth: 1966/02/07 Referring Provider: Hulan Saas DO  Encounter Date: 05/02/2016      PT End of Session - 05/02/16 1745    Visit Number 12   Number of Visits 20   Date for PT Re-Evaluation 05/20/16   Authorization Type [facility]   PT Start Time 1548   PT Stop Time 1636   PT Time Calculation (min) 48 min   Activity Tolerance Patient tolerated treatment well   Behavior During Therapy [facility] for tasks assessed/performed      Past Medical History:  Diagnosis Date  . Allergy    cats  . Anemia    NOS  . 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 GERD (gastroesophageal reflux disease)   . HSV infection    labialis  . RECTAL BLEEDING 07/10/2007   Qualifier: Diagnosis of  By: Regis Bill MD, Standley Brooking 2008 colonoscopy [facility].     Past Surgical History:  Procedure Laterality Date  . [place] RESECTION  08/27/2012   Procedure: DILATATION & CURETTAGE/HYSTEROSCOPY WITH VERSAPOINT RESECTION;  Surgeon: Princess Bruins, MD;  Location: [facility];  Service: Gynecology;;  . LASIK  2009   mono vision    There were no vitals filed for this visit.      Subjective Assessment - 05/02/16 1600    Subjective "I haven't had to do alot of paperwork and haven't had much pain in the shoulder but my upper trap is a little sore"    Currently in Pain? Yes   Pain Score 2    Pain Location Neck   Pain Orientation Left   Pain Descriptors / Indicators Sore   Pain Type Chronic pain   Pain Onset More than a month ago   Pain Frequency Intermittent   Aggravating Factors  using the R arm   Pain Relieving Factors heat/ ice, stretching                         [facility] Adult PT Treatment/Exercise - 05/02/16 1602       Shoulder Exercises: Supine   Other Supine Exercises thoracic extension 1 x 10 over bolster with bil UE flexion     Shoulder Exercises: Sidelying   Other Sidelying Exercises book opening  2 x 20 to the R     Shoulder Exercises: ROM/Strengthening   UBE (Upper Arm Bike) L1.5 x 10 min  changing pos at 5 min     Moist Heat Therapy   Number Minutes Moist Heat 10 Minutes   Moist Heat Location Shoulder     Manual Therapy   Joint Mobilization Grade 3 T1-T7 P>A , unilateral at T1-T7 on R    Scapular Mobilization upward rotation scapular assist with pt performing flexion during thoracic extension exercise     Neck Exercises: Stretches   Upper Trapezius Stretch 4 reps;30 seconds                PT Education - 05/02/16 1747    Education provided Yes   Education Details manual trigger point release beneifts using theracane and where she can find one.    Person(s) Educated Patient   Methods Explanation;Demonstration;Verbal cues   Comprehension Verbalized understanding;Returned demonstration;Verbal cues required  PT Short Term Goals - 03/18/16 1647      PT SHORT TERM GOAL #1   Title pt will be I with inital HEP (02/22/2016)   Time 3   Period Weeks   Status Achieved     PT SHORT TERM GOAL #2   Title pt will be able to verbalize and demonstrate techniques to reduce cervical pain and inflammation via RICE and HEP (02/22/2016)   Time 3   Period Weeks   Status Achieved           PT Long Term Goals - 04/15/16 1751      PT LONG TERM GOAL #1   Title pt will be I with all HEP given as of last visit (03/14/16)   Time 6   Period Weeks   Status On-going     PT LONG TERM GOAL #2   Title pt will demontrate decrease tightness of bil upper trap/ levator scapulae and surrounding muscualture to decrease pain to </= 2/10 (03/14/16)   Time 6   Period Weeks   Status Partially Met     PT LONG TERM GOAL #3   Title pt will improve cervical mobility by >/= 5 degrees in all  planes with </= 2/10 pain to assist with ADLs and promote safety during driving (1/61/09)   Time 6   Period Weeks   Status Partially Met     PT LONG TERM GOAL #4   Title pt will be able to lift and carrying >/= 8# at shoulder heigh or height with one or both arms with </= 2/10 pain to assist with funcitonal lifting and carrying activities (03/14/16)   Time 6   Period Weeks   Status Partially Met     PT LONG TERM GOAL #5   Title pt will improve her FOTO score to >/= 70 to demonstrate improvement in function at discharge (03/14/16)   Time 6   Period Weeks   Status Achieved               Plan - 05/02/16 1745    Clinical Impression Statement Mrs. Havens continues to report decreased pain with intermittent tightness inthe R lateral shoulder. Focused on thoracic and scapular mobility which she reported improved motion and decreaed tightness.    PT Next Visit Plan DN for upper trap, [place] over sub-acromial bursae, review scapular stabilization bands, R scapular mobility and GH rhythm (upward rotation), thoracic mobility, update HEP for thoracic mobs, clinic IONOT?   Consulted and Agree with Plan of Care Patient      Patient will benefit from skilled therapeutic intervention in order to improve the following deficits and impairments:  Pain, Improper body mechanics, Postural dysfunction, Hypomobility, Decreased activity tolerance, Decreased endurance, Decreased range of motion, Decreased strength, Increased muscle spasms, Impaired UE functional use  Visit Diagnosis: Neck pain  Other muscle spasm  Abnormal posture  Muscle weakness (generalized)     Problem List Patient Active Problem List   Diagnosis Date Noted  . Trigger point of left shoulder region 09/21/2015  . Impingement syndrome of right shoulder 03/08/2015  . Trigger point of right shoulder region 03/08/2015  . Low back pain 03/16/2014  . Wrist pain, right 02/09/2014  . Nonallopathic lesion of cervical region 11/03/2013   . Nonallopathic lesion of thoracic region 11/03/2013  . Nonallopathic lesion-rib cage 11/03/2013  . Nonallopathic lesion of lumbosacral region 11/03/2013  . Nonallopathic lesion of sacral region 11/03/2013  . Imbalance 10/27/2012  . Neck pain 10/27/2012  .  Headache(784.0) 07/15/2012  . Abdominal cramps 02/25/2012  . Abnormal stools 02/25/2012  . Delayed menses 02/25/2012  . Visit for preventive health examination 12/03/2011  . Abdominal bloating 12/03/2011  . DYSESTHESIA 09/20/2009  . ARREST OF BONE DEVELOPMENT OR GROWTH 07/24/2007  . DERMATOPHYTOSIS OF NAIL 07/10/2007  . ANEMIA-NOS 07/10/2007  . ALLERGIC RHINITIS 07/10/2007  . GERD 07/10/2007  . BLOOD IN STOOL 07/10/2007  . HERPES LABIALIS, HX OF 07/10/2007   Starr Lake PT, DPT, LAT, ATC  05/02/16  5:49 PM      [facility] [address] [place], [place], [address] Phone: [phone]   Fax:  [phone]  Name: JORDANA DUGUE MRN: [id number] Date of Birth: 1965-10-13

## 2016-05-13 ENCOUNTER — Ambulatory Visit: Payer: 59 | Admitting: Physical Therapy

## 2016-05-13 DIAGNOSIS — M62838 Other muscle spasm: Secondary | ICD-10-CM | POA: Diagnosis not present

## 2016-05-13 DIAGNOSIS — M6281 Muscle weakness (generalized): Secondary | ICD-10-CM | POA: Diagnosis not present

## 2016-05-13 DIAGNOSIS — M542 Cervicalgia: Secondary | ICD-10-CM

## 2016-05-13 DIAGNOSIS — R293 Abnormal posture: Secondary | ICD-10-CM | POA: Diagnosis not present

## 2016-05-13 NOTE — Therapy (Signed)
Valerie [name], [place], [address] Phone: [phone]   Fax:  [phone]  Physical Therapy Treatment  Patient Details  Name: Valerie [name] MRN: [id number] Date of Birth: May 21, 1966 Referring Provider: Hulan Saas DO  Encounter Date: 05/13/2016      PT End of Session - 05/13/16 1733    Visit Number 13   Number of Visits 20   Date for PT Re-Evaluation 05/20/16   PT Start Time 5361   PT Stop Time 4431   PT Time Calculation (min) 59 min   Activity Tolerance Patient tolerated treatment well   Behavior During Therapy [facility] for tasks assessed/performed      Past Medical History:  Diagnosis Date  . Allergy    cats  . Anemia    NOS  . 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 GERD (gastroesophageal reflux disease)   . HSV infection    labialis  . RECTAL BLEEDING 07/10/2007   Qualifier: Diagnosis of  By: Regis Bill MD, Standley Brooking 2008 colonoscopy [facility].     Past Surgical History:  Procedure Laterality Date  . [place] RESECTION  08/27/2012   Procedure: DILATATION & CURETTAGE/HYSTEROSCOPY WITH VERSAPOINT RESECTION;  Surgeon: Princess Bruins, MD;  Location: [facility];  Service: Gynecology;;  . LASIK  2009   mono vision    There were no vitals filed for this visit.      Subjective Assessment - 05/13/16 1637    Subjective " I have been doing pretty good, my shoulders have been doing alot better overall, with some occasional soreness in the neck"   Currently in Pain? Yes   Pain Score 1    Pain Location Neck   Pain Orientation Left   Pain Descriptors / Indicators Sore   Pain Type Chronic pain   Pain Onset More than a month ago   Pain Frequency Intermittent   Aggravating Factors  cold, specific movements   Pain Relieving Factors heat,                          [facility] Adult PT Treatment/Exercise - 05/13/16 1644      Shoulder Exercises:  Supine   Other Supine Exercises foam roll routine 2 x 15 each: ceiling punches, alternating ceiling punches, horizontal abduciton/ adducition, back stroke     Shoulder Exercises: ROM/Strengthening   UBE (Upper Arm Bike) L 1.5 x 8 min  changing direction at 4 min     Shoulder Exercises: Stretch   Other Shoulder Stretches upper trap stretch 2 x 30 sec hold     Moist Heat Therapy   Number Minutes Moist Heat 10 Minutes   Moist Heat Location Shoulder     Manual Therapy   Myofascial Release DTM and fascial stretching/ rolling over the L upper trap and levator scapulae          Trigger Point Dry Needling - 05/13/16 1735    Consent Given? Yes   Education Handout Provided Yes  given prevously   Muscles Treated Upper Body Levator scapulae   Upper Trapezius Response Palpable increased muscle length;Twitch reponse elicited  x 3 with pistoning/ twisting techniques   Levator Scapulae Response Twitch response elicited;Palpable increased muscle length  x1 with pistoning/ twisting techniques              PT Education - 05/13/16 1733    Education provided Yes   Education Details updated  HEP for foam roll routine for thoracic mobility   Person(s) Educated Patient   Methods Explanation;Tactile cues;Verbal cues;Demonstration;Handout   Comprehension Verbalized understanding;Verbal cues required;Returned demonstration;Tactile cues required          PT Short Term Goals - 03/18/16 1647      PT SHORT TERM GOAL #1   Title pt will be I with inital HEP (02/22/2016)   Time 3   Period Weeks   Status Achieved     PT SHORT TERM GOAL #2   Title pt will be able to verbalize and demonstrate techniques to reduce cervical pain and inflammation via RICE and HEP (02/22/2016)   Time 3   Period Weeks   Status Achieved           PT Long Term Goals - 04/15/16 1751      PT LONG TERM GOAL #1   Title pt will be I with all HEP given as of last visit (03/14/16)   Time 6   Period Weeks   Status  On-going     PT LONG TERM GOAL #2   Title pt will demontrate decrease tightness of bil upper trap/ levator scapulae and surrounding muscualture to decrease pain to </= 2/10 (03/14/16)   Time 6   Period Weeks   Status Partially Met     PT LONG TERM GOAL #3   Title pt will improve cervical mobility by >/= 5 degrees in all planes with </= 2/10 pain to assist with ADLs and promote safety during driving (8/56/31)   Time 6   Period Weeks   Status Partially Met     PT LONG TERM GOAL #4   Title pt will be able to lift and carrying >/= 8# at shoulder heigh or height with one or both arms with </= 2/10 pain to assist with funcitonal lifting and carrying activities (03/14/16)   Time 6   Period Weeks   Status Partially Met     PT LONG TERM GOAL #5   Title pt will improve her FOTO score to >/= 70 to demonstrate improvement in function at discharge (03/14/16)   Time 6   Period Weeks   Status Achieved               Plan - 05/13/16 1743    Clinical Impression Statement pt states she is doing better despite some soreness in the L upper trap intermittantly. Following foam roll technique and DN inthe L upper trap/ levator scapulae she reported decrease pain and tightness.    PT Next Visit Plan DN for upper trap, [place] over sub-acromial bursae, review scapular stabilization bands, R scapular mobility and GH rhythm, assess ROM, Goals, FOTO   PT Home Exercise Plan foam roll routine   Consulted and Agree with Plan of Care Patient      Patient will benefit from skilled therapeutic intervention in order to improve the following deficits and impairments:  Pain, Improper body mechanics, Postural dysfunction, Hypomobility, Decreased activity tolerance, Decreased endurance, Decreased range of motion, Decreased strength, Increased muscle spasms, Impaired UE functional use  Visit Diagnosis: Neck pain  Other muscle spasm  Abnormal posture  Muscle weakness (generalized)     Problem List Patient  Active Problem List   Diagnosis Date Noted  . Trigger point of left shoulder region 09/21/2015  . Impingement syndrome of right shoulder 03/08/2015  . Trigger point of right shoulder region 03/08/2015  . Low back pain 03/16/2014  . Wrist pain, right 02/09/2014  . Nonallopathic lesion  of cervical region 11/03/2013  . Nonallopathic lesion of thoracic region 11/03/2013  . Nonallopathic lesion-rib cage 11/03/2013  . Nonallopathic lesion of lumbosacral region 11/03/2013  . Nonallopathic lesion of sacral region 11/03/2013  . Imbalance 10/27/2012  . Neck pain 10/27/2012  . Headache(784.0) 07/15/2012  . Abdominal cramps 02/25/2012  . Abnormal stools 02/25/2012  . Delayed menses 02/25/2012  . Visit for preventive health examination 12/03/2011  . Abdominal bloating 12/03/2011  . DYSESTHESIA 09/20/2009  . ARREST OF BONE DEVELOPMENT OR GROWTH 07/24/2007  . DERMATOPHYTOSIS OF NAIL 07/10/2007  . ANEMIA-NOS 07/10/2007  . ALLERGIC RHINITIS 07/10/2007  . GERD 07/10/2007  . BLOOD IN STOOL 07/10/2007  . HERPES LABIALIS, HX OF 07/10/2007   Starr Lake PT, DPT, LAT, ATC  05/13/16  5:46 PM      [facility] [address] [place], [place], [address] Phone: [phone]   Fax:  [phone]  Name: SUA SPADAFORA MRN: [id number] Date of Birth: 1966/06/28

## 2016-05-13 NOTE — Patient Instructions (Signed)
Get 2 thick beach towels and roll it up so that when it is is rolled up you are able to lay on it with it being parallel to your spine supporting your head all the way to your sacrum. Perform 2 sets of 15 each of: ceiling punches, alternating ceiling punches, pulling arms out wide and hugging self (horizontal abduction/ adduction), and alternating back stroke.

## 2016-05-14 NOTE — Progress Notes (Signed)
Valerie [name] [address] [place], [place] [address] Phone: [phone] Subjective:    CC: Neck and back pain follow up  BJY:NWGNFAOZHY  Valerie [name] is a 50 y.o. female coming in with complaint of neck and back pain. We have been seen patient for quite some time. Patient though recently has been in formal physical therapy. Patient has continued going to formal physical therapy and left visit was 2 days ago. Patient states continues to have significant improvement. Not having any significant pain in the neck at all. Starting to increase her activity at work. Concern that made cause some exacerbation.     Previous x-rays of cervical spine were independently visualized by me showing no significant bony normality.  Past Medical History:  Diagnosis Date  . Allergy    cats  . Anemia    NOS  . 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 GERD (gastroesophageal reflux disease)   . HSV infection    labialis  . RECTAL BLEEDING 07/10/2007   Qualifier: Diagnosis of  By: Fabian Sharp MD, Neta Mends 2008 colonoscopy [facility].    Past Surgical History:  Procedure Laterality Date  . DILITATION & CURRETTAGE/HYSTROSCOPY WITH VERSAPOINT RESECTION  08/27/2012   Procedure: DILATATION & CURETTAGE/HYSTEROSCOPY WITH VERSAPOINT RESECTION;  Surgeon: Genia Del, MD;  Location: [facility];  Service: Gynecology;;  . LASIK  2009   mono vision   Social History   Social History  . Marital status: Married    Spouse name: N/A  . Number of children: N/A  . Years of education: N/A   Occupational History  . 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 Smoking status: Never Smoker  . Smokeless tobacco: Never Used  . Alcohol use 0.0 oz/week     Comment: occasional  . Drug use: No  . Sexual activity: Not on file   Other Topics Concern  . 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 Augmentin [Amoxicillin-Pot Clavulanate] Diarrhea  . Ceftin [Cefuroxime Axetil] Other (See Comments)    Severe yeast infection  . Clarithromycin Other (See Comments)    Significant metallic taste  . Nsaids Nausea Only and Other (See Comments)    Abdominal pain / severe reflux     Family History  Problem Relation Age of Onset  . Colon polyps Brother     elev TG  . Alcohol abuse Other   . Breast cancer Maternal Aunt   . Diabetes Father   . Hyperlipidemia Father   . Heart disease Father   . Stroke Father   . Stroke Maternal Grandmother   . Diabetes Maternal Grandmother   . Heart disease Maternal Grandmother   . Diabetes Maternal Grandfather   . Hyperlipidemia Brother     elevated triglycerides  . Liver disease Maternal Uncle     alcohol related  . Colon cancer Neg Hx       Review of Systems: No visual changes, nausea, vomiting, diarrhea, constipation, dizziness, abdominal pain, skin rash, fevers, chills, night sweats, weight loss, swollen lymph nodes, chest pain, shortness of breath, mood changes.   Objective  Blood pressure 110/82, pulse 62, weight 127 lb 6.4  oz (57.8 kg), last menstrual period 11/28/2014.  General: No apparent distress alert and oriented x3 mood and affect normal, dressed appropriately.  HEENT: Pupils equal, extraocular movements intact  Respiratory: Patient's speak in full sentences and does not appear short of breath  Cardiovascular: No lower extremity edema, non tender, no erythema  Skin: Warm dry intact with no signs of infection or rash on extremities or on axial skeleton.  Abdomen: Soft nontender  Neuro: Cranial nerves II through XII are intact, neurovascularly intact in all extremities with 2+ DTRs and 2+ pulses.  Lymph: No lymphadenopathy of posterior or anterior cervical chain or axillae bilaterally.  Gait normal with good balance and  coordination.  MSK:  Non tender with full range of motion and good stability and symmetric strength and tone of Shoulder, elbows, wrist, hip, knee and ankles bilaterally.     Neck: Inspection unremarkable. No palpable stepoff Continue mild positive Spurling's on the left side No sensory change to C4 to T1 Negative Hoffman sign bilaterally Reflexes normal No crepitus noted.  Back Exam:  Inspection: Unremarkable  Motion: Flexion 45 deg improved, Extension 35 deg, Side Bending to 35 deg bilaterally,  Rotation to 35 deg bilaterally  SLR laying: Negative  XSLR laying: Negative  FABER: negative. Sensory change: Gross sensation intact to all lumbar and sacral dermatomes.  Reflexes: 2+ at both patellar tendons, 2+ at achilles tendons, Babinski's downgoing.  Strength at foot  Plantar-flexion: 5/5 Dorsi-flexion: 5/5 Eversion: 5/5 Inversion: 5/5  Leg strength  Quad: 5/5 Hamstring: 5/5 Hip flexor: 5/5 Hip abductors: 4/5  Gait unremarkable.   OMT Physical Exam Cervical  C2 flexed rotated and side bent left   Thoracic T1 extended rotated and side bent left with elevated first rib T5 extended rotated and side bent left  Lumbar L2 flexed rotated and side bent left L4 flexed rotated and side bent right  Sacrum Left on left       Impression and Recommendations:     This case required medical decision making of moderate complexity.

## 2016-05-15 ENCOUNTER — Encounter: Payer: Self-pay | Admitting: Family Medicine

## 2016-05-15 ENCOUNTER — Ambulatory Visit (INDEPENDENT_AMBULATORY_CARE_PROVIDER_SITE_OTHER): Payer: 59 | Admitting: Family Medicine

## 2016-05-15 VITALS — BP 110/82 | HR 62 | Wt 127.4 lb

## 2016-05-15 DIAGNOSIS — M9903 Segmental and somatic dysfunction of lumbar region: Secondary | ICD-10-CM | POA: Diagnosis not present

## 2016-05-15 DIAGNOSIS — M9902 Segmental and somatic dysfunction of thoracic region: Secondary | ICD-10-CM

## 2016-05-15 DIAGNOSIS — M542 Cervicalgia: Secondary | ICD-10-CM | POA: Diagnosis not present

## 2016-05-15 DIAGNOSIS — M9901 Segmental and somatic dysfunction of cervical region: Secondary | ICD-10-CM

## 2016-05-15 DIAGNOSIS — M9904 Segmental and somatic dysfunction of sacral region: Secondary | ICD-10-CM | POA: Diagnosis not present

## 2016-05-15 DIAGNOSIS — M999 Biomechanical lesion, unspecified: Secondary | ICD-10-CM

## 2016-05-15 NOTE — Patient Instructions (Addendum)
Good to see you  I am so impressed! Stay active Keep it up without PT See me again in 8 weeks!

## 2016-05-15 NOTE — Assessment & Plan Note (Signed)
Patient back pain seems to be improving. We discussed continuing with the posture and ergonomics are up-to-date. Patient has not taken any of her medications including the gabapentin regularly. We will keep Her Less. Patient Come Back and See Me Again in 8 Weeks for Further Evaluation.

## 2016-05-15 NOTE — Assessment & Plan Note (Signed)
Decision today to treat with OMT was based on Physical Exam  After verbal consent patient was treated with HVLA, ME, FPR techniques in cervical, rib, thoracic, lumbar and sacral areas more muscle energy within the cervical spine.  Patient tolerated the procedure well with improvement in symptoms  Patient given exercises, stretches and lifestyle modifications  See medications in patient instructions if given  Patient will follow up in 4-8 weeks                                                    

## 2016-05-16 ENCOUNTER — Ambulatory Visit: Payer: 59 | Admitting: Physical Therapy

## 2016-05-16 DIAGNOSIS — M62838 Other muscle spasm: Secondary | ICD-10-CM

## 2016-05-16 DIAGNOSIS — M542 Cervicalgia: Secondary | ICD-10-CM | POA: Diagnosis not present

## 2016-05-16 DIAGNOSIS — R293 Abnormal posture: Secondary | ICD-10-CM | POA: Diagnosis not present

## 2016-05-16 DIAGNOSIS — M6281 Muscle weakness (generalized): Secondary | ICD-10-CM

## 2016-05-16 NOTE — Therapy (Signed)
[facility] [place], [place], [address] Phone: [phone]   Fax:  [phone]  Physical Therapy Treatment / Discharge Note  Patient Details  Name: Valerie [name] MRN: [id number] Date of Birth: August 18, 1966 Referring Provider: Hulan Saas DO  Encounter Date: 05/16/2016      PT End of Session - 05/16/16 1631    Visit Number 14   Number of Visits 20   Date for PT Re-Evaluation 05/20/16   PT Start Time 5732   PT Stop Time 1711   PT Time Calculation (min) 40 min   Activity Tolerance Patient tolerated treatment well   Behavior During Therapy [facility] for tasks assessed/performed      Past Medical History:  Diagnosis Date  . Allergy    cats  . Anemia    NOS  . 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 GERD (gastroesophageal reflux disease)   . HSV infection    labialis  . RECTAL BLEEDING 07/10/2007   Qualifier: Diagnosis of  By: Regis Bill MD, Standley Brooking 2008 colonoscopy [facility].     Past Surgical History:  Procedure Laterality Date  . [place] RESECTION  08/27/2012   Procedure: DILATATION & CURETTAGE/HYSTEROSCOPY WITH VERSAPOINT RESECTION;  Surgeon: Princess Bruins, MD;  Location: [facility];  Service: Gynecology;;  . LASIK  2009   mono vision    There were no vitals filed for this visit.      Subjective Assessment - 05/16/16 1634    Subjective "neck has gotten alot better and the shoulders are at 0/10, and I saw the Md and hey stated that I no longer need PT"    Currently in Pain? Yes   Pain Score 0-No pain   Pain Orientation Left            [facility] PT Assessment - 05/16/16 0001      Observation/Other Assessments   Focus on Therapeutic Outcomes (FOTO)  15% limited     AROM   Cervical Flexion 50   Cervical Extension 48   Cervical - Right Side Bend 58   Cervical - Left Side Bend 58   Cervical - Right Rotation 75   Cervical - Left Rotation 75      Strength   Right Shoulder Flexion 4+/5   Right Shoulder Extension 5/5   Right Shoulder ABduction 4/5   Left Shoulder Flexion 4+/5   Left Shoulder Extension 5/5   Left Shoulder ABduction 4/5                     [facility] Adult PT Treatment/Exercise - 05/16/16 0001      Manual Therapy   Manual therapy comments sub-occipital release x 5 min  taught how use tennis balls and perform at home     Neck Exercises: Stretches   Upper Trapezius Stretch 2 reps;30 seconds   Levator Stretch 2 reps;30 seconds                PT Education - 05/16/16 1655    Education provided Yes   Education Details Reviewed HEP and discussed how to performed sub-occipital release using tennis ball. how to progress exercises at home and provided additional theraband.    Person(s) Educated Patient   Methods Explanation;Verbal cues;Handout   Comprehension Verbalized understanding;Verbal cues required;Tactile cues required          PT Short Term Goals - 03/18/16 1647      PT SHORT TERM  GOAL #1   Title pt will be I with inital HEP (02/22/2016)   Time 3   Period Weeks   Status Achieved     PT SHORT TERM GOAL #2   Title pt will be able to verbalize and demonstrate techniques to reduce cervical pain and inflammation via RICE and HEP (02/22/2016)   Time 3   Period Weeks   Status Achieved           PT Long Term Goals - 05/16/16 1701      PT LONG TERM GOAL #1   Title pt will be I with all HEP given as of last visit (03/14/16)   Time 6   Period Weeks   Status Achieved     PT LONG TERM GOAL #2   Title pt will demontrate decrease tightness of bil upper trap/ levator scapulae and surrounding muscualture to decrease pain to </= 2/10 (03/14/16)   Time 6   Period Weeks   Status Achieved     PT LONG TERM GOAL #3   Title pt will improve cervical mobility by >/= 5 degrees in all planes with </= 2/10 pain to assist with ADLs and promote safety during driving (8/67/67)   Time 6   Period Weeks    Status Achieved     PT LONG TERM GOAL #4   Title pt will be able to lift and carrying >/= 8# at shoulder heigh or height with one or both arms with </= 2/10 pain to assist with funcitonal lifting and carrying activities (03/14/16)   Baseline 2/10 pain during 8#    Time 6   Period Weeks   Status Achieved     PT LONG TERM GOAL #5   Title pt will improve her FOTO score to >/= 70 to demonstrate improvement in function at discharge (03/14/16)   Time 6   Period Weeks   Status Achieved               Plan - 05/16/16 1714    Clinical Impression Statement Mrs. Badger has made great progress with physical therapy improving cervical mobility and shoulder strength and reports only intermittent pain. performed sub-occipital release and educated how it can be performed at home, which she reported it relieved tension. she met all goals this visit and reports she is able to maintain and progress her current level of function independently and will be discharged from PT today.    PT Next Visit Plan DN for upper trap, [place] over sub-acromial bursae, review scapular stabilization bands, R scapular mobility and GH rhythm, assess ROM, Goals, FOTO   Consulted and Agree with Plan of Care Patient      Patient will benefit from skilled therapeutic intervention in order to improve the following deficits and impairments:  Pain, Improper body mechanics, Postural dysfunction, Hypomobility, Decreased activity tolerance, Decreased endurance, Decreased range of motion, Decreased strength, Increased muscle spasms, Impaired UE functional use  Visit Diagnosis: Neck pain  Other muscle spasm  Muscle weakness (generalized)  Abnormal posture     Problem List Patient Active Problem List   Diagnosis Date Noted  . Trigger point of left shoulder region 09/21/2015  . Impingement syndrome of right shoulder 03/08/2015  . Trigger point of right shoulder region 03/08/2015  . Low back pain 03/16/2014  . Wrist pain,  right 02/09/2014  . Nonallopathic lesion of cervical region 11/03/2013  . Nonallopathic lesion of thoracic region 11/03/2013  . Nonallopathic lesion-rib cage 11/03/2013  . Nonallopathic lesion of lumbosacral  region 11/03/2013  . Nonallopathic lesion of sacral region 11/03/2013  . Imbalance 10/27/2012  . Neck pain 10/27/2012  . Headache(784.0) 07/15/2012  . Abdominal cramps 02/25/2012  . Abnormal stools 02/25/2012  . Delayed menses 02/25/2012  . Visit for preventive health examination 12/03/2011  . Abdominal bloating 12/03/2011  . DYSESTHESIA 09/20/2009  . ARREST OF BONE DEVELOPMENT OR GROWTH 07/24/2007  . DERMATOPHYTOSIS OF NAIL 07/10/2007  . ANEMIA-NOS 07/10/2007  . ALLERGIC RHINITIS 07/10/2007  . GERD 07/10/2007  . BLOOD IN STOOL 07/10/2007  . HERPES LABIALIS, HX OF 07/10/2007   Starr Lake PT, DPT, LAT, ATC  05/16/16  5:20 PM    [facility] [address] [place], [place], [address] Phone: [phone]   Fax:  [phone]  Name: SEKAI NAYAK MRN: [id number] Date of Birth: 09-May-1966   PHYSICAL THERAPY DISCHARGE SUMMARY  Visits from Start of Care: 14  Current functional level related to goals / functional outcomes: See goals   Remaining deficits: Intermittent upper trap tightness depending on activity. Intermittent pain noted in sub-acromial space that fluctuates alternating L/ R side depending on activity and repetitive activities.     Education / Equipment: HEP, theraband for strengthening, thoracic mobility/ posture  Plan: Patient agrees to discharge.  Patient goals were met. Patient is being discharged due to meeting the stated rehab goals.  ?????

## 2016-05-20 ENCOUNTER — Ambulatory Visit: Payer: 59 | Admitting: Physical Therapy

## 2016-05-22 ENCOUNTER — Encounter: Payer: 59 | Admitting: Physical Therapy

## 2016-05-22 DIAGNOSIS — J3089 Other allergic rhinitis: Secondary | ICD-10-CM | POA: Diagnosis not present

## 2016-05-22 DIAGNOSIS — J3081 Allergic rhinitis due to animal (cat) (dog) hair and dander: Secondary | ICD-10-CM | POA: Diagnosis not present

## 2016-05-22 DIAGNOSIS — J301 Allergic rhinitis due to pollen: Secondary | ICD-10-CM | POA: Diagnosis not present

## 2016-05-27 ENCOUNTER — Encounter: Payer: 59 | Admitting: Physical Therapy

## 2016-05-27 DIAGNOSIS — J3081 Allergic rhinitis due to animal (cat) (dog) hair and dander: Secondary | ICD-10-CM | POA: Diagnosis not present

## 2016-05-27 DIAGNOSIS — J301 Allergic rhinitis due to pollen: Secondary | ICD-10-CM | POA: Diagnosis not present

## 2016-05-27 DIAGNOSIS — J3089 Other allergic rhinitis: Secondary | ICD-10-CM | POA: Diagnosis not present

## 2016-05-29 ENCOUNTER — Encounter: Payer: 59 | Admitting: Physical Therapy

## 2016-05-29 DIAGNOSIS — J3081 Allergic rhinitis due to animal (cat) (dog) hair and dander: Secondary | ICD-10-CM | POA: Diagnosis not present

## 2016-05-29 DIAGNOSIS — J301 Allergic rhinitis due to pollen: Secondary | ICD-10-CM | POA: Diagnosis not present

## 2016-05-29 DIAGNOSIS — J3089 Other allergic rhinitis: Secondary | ICD-10-CM | POA: Diagnosis not present

## 2016-06-05 DIAGNOSIS — J3089 Other allergic rhinitis: Secondary | ICD-10-CM | POA: Diagnosis not present

## 2016-06-05 DIAGNOSIS — J301 Allergic rhinitis due to pollen: Secondary | ICD-10-CM | POA: Diagnosis not present

## 2016-06-05 DIAGNOSIS — J3081 Allergic rhinitis due to animal (cat) (dog) hair and dander: Secondary | ICD-10-CM | POA: Diagnosis not present

## 2016-06-07 DIAGNOSIS — R2231 Localized swelling, mass and lump, right upper limb: Secondary | ICD-10-CM | POA: Diagnosis not present

## 2016-06-11 DIAGNOSIS — Z01419 Encounter for gynecological examination (general) (routine) without abnormal findings: Secondary | ICD-10-CM | POA: Diagnosis not present

## 2016-06-11 DIAGNOSIS — Z6822 Body mass index (BMI) 22.0-22.9, adult: Secondary | ICD-10-CM | POA: Diagnosis not present

## 2016-06-11 DIAGNOSIS — Z1151 Encounter for screening for human papillomavirus (HPV): Secondary | ICD-10-CM | POA: Diagnosis not present

## 2016-06-19 DIAGNOSIS — J3081 Allergic rhinitis due to animal (cat) (dog) hair and dander: Secondary | ICD-10-CM | POA: Diagnosis not present

## 2016-06-19 DIAGNOSIS — J3089 Other allergic rhinitis: Secondary | ICD-10-CM | POA: Diagnosis not present

## 2016-06-19 DIAGNOSIS — J301 Allergic rhinitis due to pollen: Secondary | ICD-10-CM | POA: Diagnosis not present

## 2016-07-10 ENCOUNTER — Ambulatory Visit: Payer: 59 | Admitting: Family Medicine

## 2016-07-18 DIAGNOSIS — J3081 Allergic rhinitis due to animal (cat) (dog) hair and dander: Secondary | ICD-10-CM | POA: Diagnosis not present

## 2016-07-18 DIAGNOSIS — J3089 Other allergic rhinitis: Secondary | ICD-10-CM | POA: Diagnosis not present

## 2016-07-18 DIAGNOSIS — J301 Allergic rhinitis due to pollen: Secondary | ICD-10-CM | POA: Diagnosis not present

## 2016-07-23 NOTE — Progress Notes (Deleted)
Tawana ScaleZach Calista Crain D.O. [facility] [address] [place], [place] [address] Phone: [phone] Subjective:    CC: Neck and back pain follow up  BJY:NWGNFAOZHYHPI:Subjective  Valerie Creeamela S [name] is a 50 y.o. female coming in with complaint of neck and back pain. We have been seen patient for quite some time. Patient though recently has been in formal physical therapy. Patient has continued going to formal physical therapy and left visit was 2 days ago. Patient states continues to have significant improvement. Not having any significant pain in the neck at all. Starting to increase her activity at work. Concern that made cause some exacerbation.     Previous x-rays of cervical spine were independently visualized by me showing no significant bony normality.  Past Medical History:  Diagnosis Date  . Allergy    cats  . Anemia    NOS  . 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 GERD (gastroesophageal reflux disease)   . HSV infection    labialis  . RECTAL BLEEDING 07/10/2007   Qualifier: Diagnosis of  By: [name], Neta MendsWanda K 2008 colonoscopy [facility].    Past Surgical History:  Procedure Laterality Date  . DILITATION & CURRETTAGE/HYSTROSCOPY WITH VERSAPOINT RESECTION  08/27/2012   Procedure: DILATATION & CURETTAGE/HYSTEROSCOPY WITH VERSAPOINT RESECTION;  Surgeon: Genia DelMarie-Lyne Lavoie, MD;  Location: [facility];  Service: Gynecology;;  . LASIK  2009   mono vision   Social History   Social History  . Marital status: Married    Spouse name: N/A  . Number of children: N/A  . Years of education: N/A   Occupational History  . 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 Smoking status: Never Smoker  . Smokeless tobacco: Never Used  . Alcohol use 0.0 oz/week     Comment: occasional  . Drug use: No  . Sexual activity: Not on file   Other Topics Concern  . 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 Augmentin [Amoxicillin-Pot Clavulanate] Diarrhea  . Ceftin [Cefuroxime Axetil] Other (See Comments)    Severe yeast infection  . Clarithromycin Other (See Comments)    Significant metallic taste  . Nsaids Nausea Only and Other (See Comments)    Abdominal pain / severe reflux     Family History  Problem Relation Age of Onset  . Colon polyps Brother     elev TG  . Alcohol abuse Other   . Breast cancer Maternal Aunt   . Diabetes Father   . Hyperlipidemia Father   . Heart disease Father   . Stroke Father   . Stroke Maternal Grandmother   . Diabetes Maternal Grandmother   . Heart disease Maternal Grandmother   . Diabetes Maternal Grandfather   . Hyperlipidemia Brother     elevated triglycerides  . Liver disease Maternal Uncle     alcohol related  . Colon cancer Neg Hx       Review of Systems: No visual changes, nausea, vomiting, diarrhea, constipation, dizziness, abdominal pain, skin rash, fevers, chills, night sweats, weight loss, swollen lymph nodes, chest pain, shortness of breath, mood changes.   Objective  Last menstrual period 11/28/2014.  General: No apparent distress  alert and oriented x3 mood and affect normal, dressed appropriately.  HEENT: Pupils equal, extraocular movements intact  Respiratory: Patient's speak in full sentences and does not appear short of breath  Cardiovascular: No lower extremity edema, non tender, no erythema  Skin: Warm dry intact with no signs of infection or rash on extremities or on axial skeleton.  Abdomen: Soft nontender  Neuro: Cranial nerves II through XII are intact, neurovascularly intact in all extremities with 2+ DTRs and 2+ pulses.  Lymph: No lymphadenopathy of posterior or anterior cervical chain or axillae bilaterally.  Gait normal with good balance and coordination.  MSK:  Non tender with full range of motion and good  stability and symmetric strength and tone of Shoulder, elbows, wrist, hip, knee and ankles bilaterally.     Neck: Inspection unremarkable. No palpable stepoff Continue mild positive Spurling's on the left side No sensory change to C4 to T1 Negative Hoffman sign bilaterally Reflexes normal No crepitus noted.  Back Exam:  Inspection: Unremarkable  Motion: Flexion 45 deg improved, Extension 35 deg, Side Bending to 35 deg bilaterally,  Rotation to 35 deg bilaterally  SLR laying: Negative  XSLR laying: Negative  FABER: negative. Sensory change: Gross sensation intact to all lumbar and sacral dermatomes.  Reflexes: 2+ at both patellar tendons, 2+ at achilles tendons, Babinski's downgoing.  Strength at foot  Plantar-flexion: 5/5 Dorsi-flexion: 5/5 Eversion: 5/5 Inversion: 5/5  Leg strength  Quad: 5/5 Hamstring: 5/5 Hip flexor: 5/5 Hip abductors: 4/5  Gait unremarkable.   OMT Physical Exam Cervical  C2 flexed rotated and side bent left   Thoracic T1 extended rotated and side bent left with elevated first rib T5 extended rotated and side bent left  Lumbar L2 flexed rotated and side bent left L4 flexed rotated and side bent right  Sacrum Left on left       Impression and Recommendations:     This case required medical decision making of moderate complexity.

## 2016-07-24 ENCOUNTER — Ambulatory Visit: Payer: 59 | Admitting: Family Medicine

## 2016-07-25 ENCOUNTER — Ambulatory Visit (INDEPENDENT_AMBULATORY_CARE_PROVIDER_SITE_OTHER): Payer: 59 | Admitting: Family Medicine

## 2016-07-25 ENCOUNTER — Encounter: Payer: Self-pay | Admitting: Family Medicine

## 2016-07-25 VITALS — BP 108/78 | HR 69 | Wt 128.0 lb

## 2016-07-25 DIAGNOSIS — M999 Biomechanical lesion, unspecified: Secondary | ICD-10-CM

## 2016-07-25 DIAGNOSIS — M542 Cervicalgia: Secondary | ICD-10-CM

## 2016-07-25 NOTE — Assessment & Plan Note (Signed)
Decision today to treat with OMT was based on Physical Exam  After verbal consent patient was treated with HVLA, ME, FPR techniques in cervical, rib, thoracic, lumbar and sacral areas more muscle energy within the cervical spine.  Patient tolerated the procedure well with improvement in symptoms  Patient given exercises, stretches and lifestyle modifications  See medications in patient instructions if given  Patient will follow up in 8-12 weeks

## 2016-07-25 NOTE — Patient Instructions (Signed)
Always a great way for me is to the end the day.  You are doing awesome Happy holidays!  See me again in 2-3 months.

## 2016-07-25 NOTE — Progress Notes (Signed)
Tawana ScaleZach Smith D.O. [facility] [address] [place], [place] [address] Phone: [phone] Subjective:    CC: Neck and back pain follow up  BJY:NWGNFAOZHYHPI:Subjective  Valerie Creeamela S [name] is a 50 y.o. female coming in with complaint of neck and back pain.Has been 3 months since we seen patient. Has been doing much better overall. States that she has had more mid scapular pain than truly neck pain at this time. No radicular symptoms. No numbness. No weakness. Still able to do daily activities. Still a manipulation could be beneficial at this time. Mild increase in muscle tightness she states over the course last several weeks     Previous x-rays of cervical spine were independently visualized by me showing no significant bony normality.  Past Medical History:  Diagnosis Date  . Allergy    cats  . Anemia    NOS  . 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 GERD (gastroesophageal reflux disease)   . HSV infection    labialis  . RECTAL BLEEDING 07/10/2007   Qualifier: Diagnosis of  By: [name], Neta MendsWanda K 2008 colonoscopy [facility].    Past Surgical History:  Procedure Laterality Date  . DILITATION & CURRETTAGE/HYSTROSCOPY WITH VERSAPOINT RESECTION  08/27/2012   Procedure: DILATATION & CURETTAGE/HYSTEROSCOPY WITH VERSAPOINT RESECTION;  Surgeon: Genia DelMarie-Lyne Lavoie, MD;  Location: [facility];  Service: Gynecology;;  . LASIK  2009   mono vision   Social History   Social History  . Marital status: Married    Spouse name: N/A  . Number of children: N/A  . Years of education: N/A   Occupational History  . 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 Smoking status: Never Smoker  . Smokeless tobacco: Never Used  . Alcohol use 0.0 oz/week     Comment: occasional  . Drug use: No  . Sexual activity: Not on file   Other Topics Concern  . 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 Augmentin [Amoxicillin-Pot Clavulanate] Diarrhea  . Ceftin [Cefuroxime Axetil] Other (See Comments)    Severe yeast infection  . Clarithromycin Other (See Comments)    Significant metallic taste  . Nsaids Nausea Only and Other (See Comments)    Abdominal pain / severe reflux     Family History  Problem Relation Age of Onset  . Colon polyps Brother     elev TG  . Alcohol abuse Other   . Breast cancer Maternal Aunt   . Diabetes Father   . Hyperlipidemia Father   . Heart disease Father   . Stroke Father   . Stroke Maternal Grandmother   . Diabetes Maternal Grandmother   . Heart disease Maternal Grandmother   . Diabetes Maternal Grandfather   . Hyperlipidemia Brother     elevated triglycerides  . Liver disease Maternal Uncle     alcohol related  . Colon cancer Neg Hx       Review of Systems: No visual changes, nausea, vomiting, diarrhea, constipation, dizziness, abdominal pain, skin rash, fevers, chills, night sweats, weight loss, swollen lymph nodes, chest pain, shortness of breath, mood changes.   Objective  Blood pressure 108/78, pulse 69, weight 128  lb (58.1 kg), last menstrual period 11/28/2014, SpO2 96 %.  Systems examined below as of 07/25/16 General: NAD A&O x3 mood, affect normal  HEENT: Pupils equal, extraocular movements intact no nystagmus Respiratory: not short of breath at rest or with speaking Cardiovascular: No lower extremity edema, non tender Skin: Warm dry intact with no signs of infection or rash on extremities or on axial skeleton. Abdomen: Soft nontender, no masses Neuro: Cranial nerves  intact, neurovascularly intact in all extremities with 2+ DTRs and 2+ pulses. Lymph: No lymphadenopathy appreciated today  Gait normal with good balance and coordination.  MSK: Non tender with full range of motion and good stability and symmetric strength and tone of shoulders,  elbows, wrist,  knee hips and ankles bilaterally.     Neck: Inspection unremarkable. No palpable stepoff Negative Spurling's No sensory change to C4 to T1 Negative Hoffman sign bilaterally Reflexes normal No crepitus noted. No tightness in the trapezius pulses at this point patient does have mild discomfort more in the paraspinal musculature of the scapular region.  Back Exam:  Inspection: Unremarkable  Motion: Flexion 45 deg improved, Extension 35 deg, Side Bending to 35 deg bilaterally,  Rotation to 35 deg bilaterally  SLR laying: Negative  XSLR laying: Negative  FABER: negative. Sensory change: Gross sensation intact to all lumbar and sacral dermatomes.  Reflexes: 2+ at both patellar tendons, 2+ at achilles tendons, Babinski's downgoing.  Strength at foot  Plantar-flexion: 5/5 Dorsi-flexion: 5/5 Eversion: 5/5 Inversion: 5/5  Leg strength  Quad: 5/5 Hamstring: 5/5 Hip flexor: 5/5 Hip abductors: 4+/5  Gait unremarkable.   OMT Physical Exam Cervical  C2 flexed rotated and side bent left C4 flexed rotated and side bent right   Thoracic T1 extended rotated and side bent left with elevated first rib T6 extended rotated and side bent left T8 extended rotated and side bent right  Lumbar L2 flexed rotated and side bent left L5 flexed rotated and side bent right  Sacrum Left on left       Impression and Recommendations:     This case required medical decision making of moderate complexity.

## 2016-07-25 NOTE — Assessment & Plan Note (Signed)
Seems to be doing relatively well overall. We discussed posture and ergonomics. We discussed continuing the same exercises patient has been doing fairly regimen. Patient will follow-up again in 2-3 months

## 2016-08-29 DIAGNOSIS — J3089 Other allergic rhinitis: Secondary | ICD-10-CM | POA: Diagnosis not present

## 2016-08-29 DIAGNOSIS — J3081 Allergic rhinitis due to animal (cat) (dog) hair and dander: Secondary | ICD-10-CM | POA: Diagnosis not present

## 2016-08-29 DIAGNOSIS — J301 Allergic rhinitis due to pollen: Secondary | ICD-10-CM | POA: Diagnosis not present

## 2016-09-03 ENCOUNTER — Other Ambulatory Visit: Payer: Self-pay | Admitting: Internal Medicine

## 2016-09-03 DIAGNOSIS — Z1231 Encounter for screening mammogram for malignant neoplasm of breast: Secondary | ICD-10-CM

## 2016-09-05 ENCOUNTER — Ambulatory Visit: Payer: 59

## 2016-09-18 ENCOUNTER — Ambulatory Visit
Admission: RE | Admit: 2016-09-18 | Discharge: 2016-09-18 | Disposition: A | Discharge status: Home / Self Care | Payer: 59 | Source: Ambulatory Visit | Attending: Internal Medicine | Admitting: Internal Medicine

## 2016-09-18 DIAGNOSIS — Z1231 Encounter for screening mammogram for malignant neoplasm of breast: Secondary | ICD-10-CM

## 2016-10-09 ENCOUNTER — Ambulatory Visit (INDEPENDENT_AMBULATORY_CARE_PROVIDER_SITE_OTHER): Payer: 59 | Admitting: Family Medicine

## 2016-10-09 ENCOUNTER — Encounter: Payer: Self-pay | Admitting: Family Medicine

## 2016-10-09 VITALS — BP 118/80 | HR 62 | Ht 63.5 in | Wt 132.0 lb

## 2016-10-09 DIAGNOSIS — M542 Cervicalgia: Secondary | ICD-10-CM

## 2016-10-09 DIAGNOSIS — M999 Biomechanical lesion, unspecified: Secondary | ICD-10-CM | POA: Diagnosis not present

## 2016-10-09 MED ORDER — GABAPENTIN 100 MG PO CAPS: 200.0000 mg | ORAL_CAPSULE | Freq: Every day | ORAL | 3 refills | Status: DC

## 2016-10-09 MED ORDER — METHOCARBAMOL 500 MG PO TABS: 500.0000 mg | ORAL_TABLET | Freq: Three times a day (TID) | ORAL | 2 refills | Status: DC

## 2016-10-09 MED FILL — GABAPENTIN 100 MG CAPSULE: 100 | 90 days supply | Qty: 180 | Fill #0

## 2016-10-09 MED FILL — METHOCARBAMOL 500 MG TABLET: 500 | 30 days supply | Qty: 90 | Fill #0

## 2016-10-09 NOTE — Progress Notes (Signed)
Valerie [name] [address] [place], [place] [address] Phone: [phone] Subjective:     CC: Neck and upper arm pain  BJY:NWGNFAOZHY  Valerie [name] is a 51 y.o. female coming in with complaint of neck and upper arm pain. Patient is seeming multiple times and has been some good relief with some manipulation. Patient is also had an exacerbation secondary to manipulation so we have had decreased certain activities. We have done 100 mg gabapentin 9 tendon stump was some of the radicular symptom. Patient states overall doing well but is starting have worsening pain again. No numbness, radiation down the arm at this     Past Medical History:  Diagnosis Date  . Allergy    cats  . Anemia    NOS  . 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 GERD (gastroesophageal reflux disease)   . HSV infection    labialis  . RECTAL BLEEDING 07/10/2007   Qualifier: Diagnosis of  By: Fabian Sharp MD, Neta Mends 2008 colonoscopy [facility].    Past Surgical History:  Procedure Laterality Date  . DILITATION & CURRETTAGE/HYSTROSCOPY WITH VERSAPOINT RESECTION  08/27/2012   Procedure: DILATATION & CURETTAGE/HYSTEROSCOPY WITH VERSAPOINT RESECTION;  Surgeon: Genia Del, MD;  Location: [facility];  Service: Gynecology;;  . LASIK  2009   mono vision   Social History   Social History  . Marital status: Married    Spouse name: N/A  . Number of children: N/A  . 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 Smoking status: Never Smoker  . Smokeless tobacco: Never Used  . Alcohol use 0.0 oz/week     Comment: occasional  . Drug use: No  . Sexual activity: Not Asked   Other Topics Concern  . 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  Augmentin [Amoxicillin-Pot Clavulanate] Diarrhea  . Ceftin [Cefuroxime Axetil] Other (See Comments)    Severe yeast infection  . Clarithromycin Other (See Comments)    Significant metallic taste  . Nsaids Nausea Only and Other (See Comments)    Abdominal pain / severe reflux   Family History  Problem Relation Age of Onset  . Colon polyps Brother     elev TG  . Alcohol abuse Other   . Breast cancer Maternal Aunt   . Diabetes Father   . Hyperlipidemia Father   . Heart disease Father   . Stroke Father   . Stroke Maternal Grandmother   . Diabetes Maternal Grandmother   . Heart disease Maternal Grandmother   . Diabetes Maternal Grandfather   . Hyperlipidemia Brother     elevated triglycerides  . Liver disease Maternal Uncle     alcohol related  . Colon cancer Neg Hx     Past medical history, social, surgical and family history all reviewed in electronic medical record.  No pertanent information unless stated regarding to the chief complaint.   Review of Systems:Review of systems updated and as accurate as of 10/09/16  No headache, visual changes, nausea, vomiting, diarrhea, constipation, dizziness, abdominal pain, skin rash, fevers, chills, night sweats, weight loss, swollen lymph nodes, body  aches, joint swelling, muscle aches, chest pain, shortness of breath, mood changes.   Objective  Blood pressure 118/80, pulse 62, height 5' 3.5" (1.613 m), weight 132 lb (59.9 kg), last menstrual period 11/28/2014. Systems examined below as of 10/09/16   General: No apparent distress alert and oriented x3 mood and affect normal, dressed appropriately.  HEENT: Pupils equal, extraocular movements intact  Respiratory: Patient's speak in full sentences and does not appear short of breath  Cardiovascular: No lower extremity edema, non tender, no erythema  Skin: Warm dry intact with no signs of infection or rash on extremities or on axial skeleton.  Abdomen: Soft nontender  Neuro: Cranial nerves  II through XII are intact, neurovascularly intact in all extremities with 2+ DTRs and 2+ pulses.  Lymph: No lymphadenopathy of posterior or anterior cervical chain or axillae bilaterally.  Gait normal with good balance and coordination.  MSK:  Non tender with full range of motion and good stability and symmetric strength and tone of shoulders, elbows, wrist, hip, knee and ankles bilaterally.  Neck: Inspection unremarkable. Continues to have some spasm on the left trapezius muscle No palpable stepoffs. Negative Spurling's maneuver. Mild limitation in range of motion lacking the last 10 of rotation bilaterally minimal sidebending to left Grip strength and sensation normal in bilateral hands Strength good C4 to T1 distribution No sensory change to C4 to T1 Negative Hoffman sign bilaterally Reflexes normal  Osteopathic findings Cervical C2 flexed rotated and side bent right C4 flexed rotated and side bent left C6 flexed rotated and side bent left T3 extended rotated and side bent right inhaled third rib T9 extended rotated and side bent left L2 flexed rotated and side bent right Sacrum right on right    Impression and Recommendations:     This case required medical decision making of moderate complexity.      Note: This dictation was prepared with Dragon dictation along with smaller phrase technology. Any transcriptional errors that result from this process are unintentional.

## 2016-10-09 NOTE — Assessment & Plan Note (Signed)
Decision today to treat with OMT was based on Physical Exam  After verbal consent patient was treated with HVLA, ME, FPR techniques in cervical, thoracic, rib, lumbar and sacral areas  Patient tolerated the procedure well with improvement in symptoms  Patient given exercises, stretches and lifestyle modifications  See medications in patient instructions if given  Patient will follow up in 8-12 weeks 

## 2016-10-09 NOTE — Assessment & Plan Note (Signed)
Continues to be a chronic problem. Discussed with patient at great length. We discussed icing regimen and home exercises. Discussed which activities doing which ones do not. Refilled gabapentin and Robaxin. Encourage ergonomic changes. Follow-up again within 3 months

## 2016-10-09 NOTE — Patient Instructions (Signed)
Good to see you  Gustavus Bryantce is your friend.  I am impressed  Continue the gabapentin  Refilled robaxin  I am here if you need me See me again in 3 months.

## 2016-10-28 DIAGNOSIS — J3089 Other allergic rhinitis: Secondary | ICD-10-CM | POA: Diagnosis not present

## 2016-10-28 DIAGNOSIS — J301 Allergic rhinitis due to pollen: Secondary | ICD-10-CM | POA: Diagnosis not present

## 2016-10-28 DIAGNOSIS — J3081 Allergic rhinitis due to animal (cat) (dog) hair and dander: Secondary | ICD-10-CM | POA: Diagnosis not present

## 2016-10-30 DIAGNOSIS — M65341 Trigger finger, right ring finger: Secondary | ICD-10-CM | POA: Diagnosis not present

## 2016-10-30 DIAGNOSIS — M79644 Pain in right finger(s): Secondary | ICD-10-CM | POA: Diagnosis not present

## 2016-11-06 DIAGNOSIS — J301 Allergic rhinitis due to pollen: Secondary | ICD-10-CM | POA: Diagnosis not present

## 2016-11-06 DIAGNOSIS — J3089 Other allergic rhinitis: Secondary | ICD-10-CM | POA: Diagnosis not present

## 2016-11-06 DIAGNOSIS — J3081 Allergic rhinitis due to animal (cat) (dog) hair and dander: Secondary | ICD-10-CM | POA: Diagnosis not present

## 2016-11-11 DIAGNOSIS — S82831A Other fracture of upper and lower end of right fibula, initial encounter for closed fracture: Secondary | ICD-10-CM | POA: Diagnosis not present

## 2016-11-13 MED FILL — VIT D2 1.25 MG (50,000 UNIT: 1.25 MG | 84 days supply | Qty: 12 | Fill #0

## 2016-11-14 DIAGNOSIS — J301 Allergic rhinitis due to pollen: Secondary | ICD-10-CM | POA: Diagnosis not present

## 2016-11-14 DIAGNOSIS — H1045 Other chronic allergic conjunctivitis: Secondary | ICD-10-CM | POA: Diagnosis not present

## 2016-11-14 DIAGNOSIS — J3089 Other allergic rhinitis: Secondary | ICD-10-CM | POA: Diagnosis not present

## 2016-11-14 MED FILL — EPINEPHRINE 0.3 MG AUTO-INJ: 0.3 | 30 days supply | Qty: 2 | Fill #0

## 2016-11-18 DIAGNOSIS — M25512 Pain in left shoulder: Secondary | ICD-10-CM | POA: Diagnosis not present

## 2016-11-18 DIAGNOSIS — M542 Cervicalgia: Secondary | ICD-10-CM | POA: Diagnosis not present

## 2016-11-18 DIAGNOSIS — M25511 Pain in right shoulder: Secondary | ICD-10-CM | POA: Diagnosis not present

## 2016-11-20 DIAGNOSIS — J3089 Other allergic rhinitis: Secondary | ICD-10-CM | POA: Diagnosis not present

## 2016-11-20 DIAGNOSIS — J301 Allergic rhinitis due to pollen: Secondary | ICD-10-CM | POA: Diagnosis not present

## 2016-11-20 DIAGNOSIS — H1045 Other chronic allergic conjunctivitis: Secondary | ICD-10-CM | POA: Diagnosis not present

## 2016-11-20 DIAGNOSIS — J3081 Allergic rhinitis due to animal (cat) (dog) hair and dander: Secondary | ICD-10-CM | POA: Diagnosis not present

## 2016-11-21 MED FILL — AZELASTINE HCL 0.05% DROPS: 0.05 | 30 days supply | Qty: 6 | Fill #0

## 2016-11-28 DIAGNOSIS — M25512 Pain in left shoulder: Secondary | ICD-10-CM | POA: Diagnosis not present

## 2016-11-28 DIAGNOSIS — M542 Cervicalgia: Secondary | ICD-10-CM | POA: Diagnosis not present

## 2016-11-28 DIAGNOSIS — M25511 Pain in right shoulder: Secondary | ICD-10-CM | POA: Diagnosis not present

## 2016-12-04 DIAGNOSIS — J301 Allergic rhinitis due to pollen: Secondary | ICD-10-CM | POA: Diagnosis not present

## 2016-12-04 DIAGNOSIS — J3081 Allergic rhinitis due to animal (cat) (dog) hair and dander: Secondary | ICD-10-CM | POA: Diagnosis not present

## 2016-12-04 DIAGNOSIS — J3089 Other allergic rhinitis: Secondary | ICD-10-CM | POA: Diagnosis not present

## 2016-12-05 DIAGNOSIS — S82831D Other fracture of upper and lower end of right fibula, subsequent encounter for closed fracture with routine healing: Secondary | ICD-10-CM | POA: Diagnosis not present

## 2016-12-09 DIAGNOSIS — M25512 Pain in left shoulder: Secondary | ICD-10-CM | POA: Diagnosis not present

## 2016-12-09 DIAGNOSIS — M542 Cervicalgia: Secondary | ICD-10-CM | POA: Diagnosis not present

## 2016-12-09 DIAGNOSIS — M25511 Pain in right shoulder: Secondary | ICD-10-CM | POA: Diagnosis not present

## 2016-12-11 DIAGNOSIS — J3089 Other allergic rhinitis: Secondary | ICD-10-CM | POA: Diagnosis not present

## 2016-12-11 DIAGNOSIS — J301 Allergic rhinitis due to pollen: Secondary | ICD-10-CM | POA: Diagnosis not present

## 2016-12-11 DIAGNOSIS — J3081 Allergic rhinitis due to animal (cat) (dog) hair and dander: Secondary | ICD-10-CM | POA: Diagnosis not present

## 2016-12-18 DIAGNOSIS — M65341 Trigger finger, right ring finger: Secondary | ICD-10-CM | POA: Diagnosis not present

## 2017-01-01 DIAGNOSIS — J3089 Other allergic rhinitis: Secondary | ICD-10-CM | POA: Diagnosis not present

## 2017-01-01 DIAGNOSIS — J3081 Allergic rhinitis due to animal (cat) (dog) hair and dander: Secondary | ICD-10-CM | POA: Diagnosis not present

## 2017-01-01 DIAGNOSIS — J301 Allergic rhinitis due to pollen: Secondary | ICD-10-CM | POA: Diagnosis not present

## 2017-01-08 ENCOUNTER — Ambulatory Visit (INDEPENDENT_AMBULATORY_CARE_PROVIDER_SITE_OTHER): Payer: 59 | Admitting: Family Medicine

## 2017-01-08 ENCOUNTER — Encounter: Payer: Self-pay | Admitting: Family Medicine

## 2017-01-08 VITALS — BP 120/80 | HR 67 | Ht 63.5 in | Wt 129.0 lb

## 2017-01-08 DIAGNOSIS — G8929 Other chronic pain: Secondary | ICD-10-CM | POA: Diagnosis not present

## 2017-01-08 DIAGNOSIS — M5442 Lumbago with sciatica, left side: Secondary | ICD-10-CM | POA: Diagnosis not present

## 2017-01-08 DIAGNOSIS — M542 Cervicalgia: Secondary | ICD-10-CM

## 2017-01-08 DIAGNOSIS — M999 Biomechanical lesion, unspecified: Secondary | ICD-10-CM | POA: Diagnosis not present

## 2017-01-08 NOTE — Patient Instructions (Signed)
You re awesome.  Very impressed Keep doing what you are doing  Valerie [name] I think would work for you  See me again in 3 months.

## 2017-01-08 NOTE — Progress Notes (Signed)
Valerie [name] [address] [place], [place] [address] Phone: [phone] Subjective:     CC: Neck and upper arm pain f/u  BJY:NWGNFAOZHY  Valerie [name] is a 51 y.o. female coming in with complaint of neck and upper arm pain.Has not been seen for 3 months. Has been doing significantly better. Not taking any vitamins or the other medications. Has made significant progress. Feels like patient has made improvement. Happy with the results overall.  Mild increasing tightness of the lower back. Patient was doing some gardening. Also had an injury with a lateral avulsion fracture. Was in a [place] for 3 weeks. Feels since then has had tightness of the back     Past Medical History:  Diagnosis Date  . Allergy    cats  . Anemia    NOS  . 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 GERD (gastroesophageal reflux disease)   . HSV infection    labialis  . RECTAL BLEEDING 07/10/2007   Qualifier: Diagnosis of  By: Fabian Sharp MD, Neta Mends 2008 colonoscopy [facility].    Past Surgical History:  Procedure Laterality Date  . DILITATION & CURRETTAGE/HYSTROSCOPY WITH VERSAPOINT RESECTION  08/27/2012   Procedure: DILATATION & CURETTAGE/HYSTEROSCOPY WITH VERSAPOINT RESECTION;  Surgeon: Genia Del, MD;  Location: [facility];  Service: Gynecology;;  . LASIK  2009   mono vision   Social History   Social History  . Marital status: Married    Spouse name: N/A  . Number of children: N/A  . 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 Smoking status: Never Smoker  . Smokeless tobacco: Never Used  . Alcohol use 0.0 oz/week     Comment: occasional  . Drug use: No  . Sexual activity: Not Asked   Other Topics Concern  . 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 Augmentin [Amoxicillin-Pot Clavulanate] Diarrhea  . Ceftin [Cefuroxime Axetil] Other (See Comments)    Severe yeast infection  . Clarithromycin Other (See Comments)    Significant metallic taste  . Nsaids Nausea Only and Other (See Comments)    Abdominal pain / severe reflux   Family History  Problem Relation Age of Onset  . Colon polyps Brother        elev TG  . Alcohol abuse Other   . Breast cancer Maternal Aunt   . Diabetes Father   . Hyperlipidemia Father   . Heart disease Father   . Stroke Father   . Stroke Maternal Grandmother   . Diabetes Maternal Grandmother   . Heart disease Maternal Grandmother   . Diabetes Maternal Grandfather   . Hyperlipidemia Brother        elevated triglycerides  . Liver disease Maternal Uncle        alcohol related  . Colon cancer Neg Hx     Past medical history, social, surgical and family history all reviewed in electronic medical record.  No pertanent information unless stated regarding to the chief complaint.   Review of Systems:Review of systems updated and as accurate as of 01/08/17  No headache, visual changes,  nausea, vomiting, diarrhea, constipation, dizziness, abdominal pain, skin rash, fevers, chills, night sweats, weight loss, swollen lymph nodes, body aches, joint swelling, muscle aches, chest pain, shortness of breath, mood changes.   Objective  Blood pressure 120/80, pulse 67, height 5' 3.5" (1.613 m), weight 129 lb (58.5 kg), last menstrual period 11/28/2014, SpO2 99 %. Systems examined below as of 01/08/17   General: No apparent distress alert and oriented x3 mood and affect normal, dressed appropriately.  HEENT: Pupils equal, extraocular movements intact  Respiratory: Patient's speak in full sentences and does not appear short of breath  Cardiovascular: No lower extremity edema, non tender, no erythema  Skin: Warm dry intact with no signs of infection or rash on extremities or on axial  skeleton.  Abdomen: Soft nontender  Neuro: Cranial nerves II through XII are intact, neurovascularly intact in all extremities with 2+ DTRs and 2+ pulses.  Lymph: No lymphadenopathy of posterior or anterior cervical chain or axillae bilaterally.  Gait normal with good balance and coordination.  MSK:  Non tender with full range of motion and good stability and symmetric strength and tone of shoulders, elbows, wrist, hip, knee and ankles bilaterally.  Neck: Inspection unremarkable. No palpable stepoffs. Negative Spurling's maneuver. Full neck range of motion Grip strength and sensation normal in bilateral hands Strength good C4 to T1 distribution No sensory change to C4 to T1 Negative Hoffman sign bilaterally Reflexes normal  Osteopathic findings Cervical C2 flexed rotated and side bent right C7 flexed rotated and side bent left T3 extended rotated and side bent right inhaled third rib T7 extended rotated and side bent left L2 flexed rotated and side bent right Sacrum right on right     Impression and Recommendations:     This case required medical decision making of moderate complexity.      Note: This dictation was prepared with Dragon dictation along with smaller phrase technology. Any transcriptional errors that result from this process are unintentional.

## 2017-01-08 NOTE — Assessment & Plan Note (Signed)
Stable overall. Patient seems to be doing relatively well. We discussed icing regimen and home exercises as well as encourage patient to continue to work on posture and stretching. Follow-up again in 3 months

## 2017-01-08 NOTE — Assessment & Plan Note (Signed)
Seems to be more of a hip flexor tightness. Responding well to the pronation. Given home exercises that I think be beneficial. Follow-up again in 3 months and can come sooner if no improvement.

## 2017-01-08 NOTE — Assessment & Plan Note (Signed)
Decision today to treat with OMT was based on Physical Exam  After verbal consent patient was treated with HVLA, ME, FPR techniques in cervical, thoracic, rib lumbar and sacral areas  Patient tolerated the procedure well with improvement in symptoms  Patient given exercises, stretches and lifestyle modifications  See medications in patient instructions if given  Patient will follow up in 12 weeks 

## 2017-01-14 DIAGNOSIS — J3089 Other allergic rhinitis: Secondary | ICD-10-CM | POA: Diagnosis not present

## 2017-01-14 DIAGNOSIS — J3081 Allergic rhinitis due to animal (cat) (dog) hair and dander: Secondary | ICD-10-CM | POA: Diagnosis not present

## 2017-01-14 DIAGNOSIS — J301 Allergic rhinitis due to pollen: Secondary | ICD-10-CM | POA: Diagnosis not present

## 2017-01-21 DIAGNOSIS — J301 Allergic rhinitis due to pollen: Secondary | ICD-10-CM | POA: Diagnosis not present

## 2017-01-21 DIAGNOSIS — J3081 Allergic rhinitis due to animal (cat) (dog) hair and dander: Secondary | ICD-10-CM | POA: Diagnosis not present

## 2017-01-21 DIAGNOSIS — J3089 Other allergic rhinitis: Secondary | ICD-10-CM | POA: Diagnosis not present

## 2017-01-23 DIAGNOSIS — J301 Allergic rhinitis due to pollen: Secondary | ICD-10-CM | POA: Diagnosis not present

## 2017-01-23 DIAGNOSIS — J3089 Other allergic rhinitis: Secondary | ICD-10-CM | POA: Diagnosis not present

## 2017-01-23 DIAGNOSIS — J3081 Allergic rhinitis due to animal (cat) (dog) hair and dander: Secondary | ICD-10-CM | POA: Diagnosis not present

## 2017-01-28 DIAGNOSIS — J3081 Allergic rhinitis due to animal (cat) (dog) hair and dander: Secondary | ICD-10-CM | POA: Diagnosis not present

## 2017-01-28 DIAGNOSIS — J3089 Other allergic rhinitis: Secondary | ICD-10-CM | POA: Diagnosis not present

## 2017-01-28 DIAGNOSIS — J301 Allergic rhinitis due to pollen: Secondary | ICD-10-CM | POA: Diagnosis not present

## 2017-01-30 DIAGNOSIS — J301 Allergic rhinitis due to pollen: Secondary | ICD-10-CM | POA: Diagnosis not present

## 2017-01-30 DIAGNOSIS — J3081 Allergic rhinitis due to animal (cat) (dog) hair and dander: Secondary | ICD-10-CM | POA: Diagnosis not present

## 2017-01-30 DIAGNOSIS — J3089 Other allergic rhinitis: Secondary | ICD-10-CM | POA: Diagnosis not present

## 2017-02-12 DIAGNOSIS — J301 Allergic rhinitis due to pollen: Secondary | ICD-10-CM | POA: Diagnosis not present

## 2017-02-12 DIAGNOSIS — J3089 Other allergic rhinitis: Secondary | ICD-10-CM | POA: Diagnosis not present

## 2017-02-12 DIAGNOSIS — J3081 Allergic rhinitis due to animal (cat) (dog) hair and dander: Secondary | ICD-10-CM | POA: Diagnosis not present

## 2017-02-20 DIAGNOSIS — J3081 Allergic rhinitis due to animal (cat) (dog) hair and dander: Secondary | ICD-10-CM | POA: Diagnosis not present

## 2017-02-20 DIAGNOSIS — J3089 Other allergic rhinitis: Secondary | ICD-10-CM | POA: Diagnosis not present

## 2017-02-20 DIAGNOSIS — J301 Allergic rhinitis due to pollen: Secondary | ICD-10-CM | POA: Diagnosis not present

## 2017-03-12 DIAGNOSIS — J3081 Allergic rhinitis due to animal (cat) (dog) hair and dander: Secondary | ICD-10-CM | POA: Diagnosis not present

## 2017-03-12 DIAGNOSIS — J301 Allergic rhinitis due to pollen: Secondary | ICD-10-CM | POA: Diagnosis not present

## 2017-03-12 DIAGNOSIS — J3089 Other allergic rhinitis: Secondary | ICD-10-CM | POA: Diagnosis not present

## 2017-03-18 DIAGNOSIS — J3081 Allergic rhinitis due to animal (cat) (dog) hair and dander: Secondary | ICD-10-CM | POA: Diagnosis not present

## 2017-03-18 DIAGNOSIS — J301 Allergic rhinitis due to pollen: Secondary | ICD-10-CM | POA: Diagnosis not present

## 2017-03-18 DIAGNOSIS — J3089 Other allergic rhinitis: Secondary | ICD-10-CM | POA: Diagnosis not present

## 2017-04-01 ENCOUNTER — Other Ambulatory Visit: Payer: Self-pay | Admitting: Orthopedic Surgery

## 2017-04-02 DIAGNOSIS — J3081 Allergic rhinitis due to animal (cat) (dog) hair and dander: Secondary | ICD-10-CM | POA: Diagnosis not present

## 2017-04-02 DIAGNOSIS — J3089 Other allergic rhinitis: Secondary | ICD-10-CM | POA: Diagnosis not present

## 2017-04-02 DIAGNOSIS — J301 Allergic rhinitis due to pollen: Secondary | ICD-10-CM | POA: Diagnosis not present

## 2017-04-16 ENCOUNTER — Ambulatory Visit (INDEPENDENT_AMBULATORY_CARE_PROVIDER_SITE_OTHER): Payer: 59 | Admitting: Family Medicine

## 2017-04-16 ENCOUNTER — Encounter: Payer: Self-pay | Admitting: Family Medicine

## 2017-04-16 VITALS — BP 112/70 | HR 70 | Ht 63.5 in | Wt 128.0 lb

## 2017-04-16 DIAGNOSIS — M999 Biomechanical lesion, unspecified: Secondary | ICD-10-CM

## 2017-04-16 DIAGNOSIS — G8929 Other chronic pain: Secondary | ICD-10-CM | POA: Diagnosis not present

## 2017-04-16 DIAGNOSIS — M5442 Lumbago with sciatica, left side: Secondary | ICD-10-CM

## 2017-04-16 NOTE — Assessment & Plan Note (Signed)
Decision today to treat with OMT was based on Physical Exam  After verbal consent patient was treated with HVLA, ME, FPR techniques in cervical, thoracic, lumbar and sacral areas  Patient tolerated the procedure well with improvement in symptoms  Patient given exercises, stretches and lifestyle modifications  See medications in patient instructions if given  Patient will follow up in 3-6 weeks 

## 2017-04-16 NOTE — Progress Notes (Signed)
Valerie [name] [address] [place], [place] [address] Phone: [phone] Subjective:    I'm seeing this patient by the request  of:    CC: Neck and back pain  [id number]  Valerie [name] is a 51 y.o. female coming in with complaint of  Housing patient on multiple occasions. Has had neck and back pain. Has responded very well to manipulation previously. Has been doing the exercises intermittently. Patient rates the severity of pain though as 7 out of 10. States and has been 3 months since previous manipulation.    Past Medical History:  Diagnosis Date  . Allergy    cats  . Anemia    NOS  . 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 GERD (gastroesophageal reflux disease)   . HSV infection    labialis  . RECTAL BLEEDING 07/10/2007   Qualifier: Diagnosis of  By: Fabian Sharp MD, Neta Mends 2008 colonoscopy [facility].    Past Surgical History:  Procedure Laterality Date  . DILITATION & CURRETTAGE/HYSTROSCOPY WITH VERSAPOINT RESECTION  08/27/2012   Procedure: DILATATION & CURETTAGE/HYSTEROSCOPY WITH VERSAPOINT RESECTION;  Surgeon: Genia Del, MD;  Location: [facility];  Service: Gynecology;;  . LASIK  2009   mono vision   Social History   Social History  . Marital status: Married    Spouse name: N/A  . Number of children: N/A  . Years of education: N/A   Occupational History  . PA [place]    [employer]   Social History Main Topics  . Smoking status: Never Smoker  . Smokeless tobacco: Never Used  . Alcohol use 0.0 oz/week     Comment: occasional  . Drug use: No  . Sexual activity: Not Asked   Other Topics Concern  . 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 Augmentin [Amoxicillin-Pot Clavulanate] Diarrhea  . Ceftin [Cefuroxime Axetil] Other (See Comments)   Severe yeast infection  . Clarithromycin Other (See Comments)    Significant metallic taste  . Nsaids Nausea Only and Other (See Comments)    Abdominal pain / severe reflux   Family History  Problem Relation Age of Onset  . Colon polyps Brother        elev TG  . Alcohol abuse Other   . Breast cancer Maternal Aunt   . Diabetes Father   . Hyperlipidemia Father   . Heart disease Father   . Stroke Father   . Stroke Maternal Grandmother   . Diabetes Maternal Grandmother   . Heart disease Maternal Grandmother   . Diabetes Maternal Grandfather   . Hyperlipidemia Brother        elevated triglycerides  . Liver disease Maternal Uncle        alcohol related  . Colon cancer Neg Hx      Past medical history, social, surgical and family history all reviewed in electronic medical record.  No pertanent information unless stated regarding to the chief complaint.   Review of Systems:Review of systems updated and as accurate as of 04/16/17  No headache, visual changes, nausea, vomiting, diarrhea, constipation, dizziness, abdominal pain, skin rash, fevers, chills, night sweats, weight loss, swollen lymph nodes, body aches, joint swelling, muscle aches, chest pain,  shortness of breath, mood changes. Positive muscle aches  Objective  Blood pressure 112/70, pulse 70, height 5' 3.5" (1.613 m), weight 128 lb (58.1 kg), last menstrual period 11/28/2014, SpO2 97 %. Systems examined below as of 04/16/17   General: No apparent distress alert and oriented x3 mood and affect normal, dressed appropriately.  HEENT: Pupils equal, extraocular movements intact  Respiratory: Patient's speak in full sentences and does not appear short of breath  Cardiovascular: No lower extremity edema, non tender, no erythema  Skin: Warm dry intact with no signs of infection or rash on extremities or on axial skeleton.  Abdomen: Soft nontender  Neuro: Cranial nerves II through XII are intact, neurovascularly intact in all  extremities with 2+ DTRs and 2+ pulses.  Lymph: No lymphadenopathy of posterior or anterior cervical chain or axillae bilaterally.  Gait normal with good balance and coordination.  MSK:  Non tender with full range of motion and good stability and symmetric strength and tone of shoulders, elbows, wrist, hip, knee and ankles bilaterally.  Neck: Inspection mild loss of lordosis. No palpable stepoffs. Negative Spurling's maneuver. Lacks last 5 of extension Grip strength and sensation normal in bilateral hands Strength good C4 to T1 distribution No sensory change to C4 to T1 Negative Hoffman sign bilaterally Reflexes normal  Back Exam:  Inspection: Unremarkable  Motion: Flexion 45 deg, Extension 20 deg, Side Bending to 35 deg bilaterally,  Rotation to 35 deg bilaterally  SLR laying: Negative  XSLR laying: Negative  Palpable tenderness: Tender to palpation in the paraspinal musculature lumbar spine.Marland Kitchen FABER: Tightness bilaterally. Sensory change: Gross sensation intact to all lumbar and sacral dermatomes.  Reflexes: 2+ at both patellar tendons, 2+ at achilles tendons, Babinski's downgoing.  Strength at foot  Plantar-flexion: 5/5 Dorsi-flexion: 5/5 Eversion: 5/5 Inversion: 5/5  Leg strength  Quad: 5/5 Hamstring: 5/5 Hip flexor: 5/5 Hip abductors: 5/5  Gait unremarkable.  Osteopathic findings C2 flexed rotated and side bent right C4 flexed rotated and side bent left C7 flexed rotated and side bent left T1 extended rotated and side bent right elevated first rib T6 extended rotated and side bent left L2 flexed rotated and side bent right Sacrum right on right     Impression and Recommendations:     This case required medical decision making of moderate complexity.      Note: This dictation was prepared with Dragon dictation along with smaller phrase technology. Any transcriptional errors that result from this process are unintentional.

## 2017-04-16 NOTE — Assessment & Plan Note (Signed)
Low back pain. Discussed with patient the core strengthening and posture. Has been doing remarkably well. I do feel that the 3 month intervals seems to be doing great. Continue same medications. Continue all the other regimen. Follow-up again in 3 months

## 2017-04-16 NOTE — Patient Instructions (Signed)
Good to see you  Ice is your friend Don't change a thing Posture though.... See me again in 3 months!

## 2017-04-23 DIAGNOSIS — J3089 Other allergic rhinitis: Secondary | ICD-10-CM | POA: Diagnosis not present

## 2017-04-23 DIAGNOSIS — J301 Allergic rhinitis due to pollen: Secondary | ICD-10-CM | POA: Diagnosis not present

## 2017-04-23 DIAGNOSIS — J3081 Allergic rhinitis due to animal (cat) (dog) hair and dander: Secondary | ICD-10-CM | POA: Diagnosis not present

## 2017-04-30 DIAGNOSIS — J3081 Allergic rhinitis due to animal (cat) (dog) hair and dander: Secondary | ICD-10-CM | POA: Diagnosis not present

## 2017-04-30 DIAGNOSIS — J301 Allergic rhinitis due to pollen: Secondary | ICD-10-CM | POA: Diagnosis not present

## 2017-04-30 DIAGNOSIS — J3089 Other allergic rhinitis: Secondary | ICD-10-CM | POA: Diagnosis not present

## 2017-05-01 DIAGNOSIS — H524 Presbyopia: Secondary | ICD-10-CM | POA: Diagnosis not present

## 2017-05-01 DIAGNOSIS — H5212 Myopia, left eye: Secondary | ICD-10-CM | POA: Diagnosis not present

## 2017-05-06 DIAGNOSIS — J301 Allergic rhinitis due to pollen: Secondary | ICD-10-CM | POA: Diagnosis not present

## 2017-05-06 DIAGNOSIS — J3081 Allergic rhinitis due to animal (cat) (dog) hair and dander: Secondary | ICD-10-CM | POA: Diagnosis not present

## 2017-05-06 DIAGNOSIS — J3089 Other allergic rhinitis: Secondary | ICD-10-CM | POA: Diagnosis not present

## 2017-05-09 ENCOUNTER — Encounter: Payer: Self-pay | Admitting: Internal Medicine

## 2017-05-14 DIAGNOSIS — J301 Allergic rhinitis due to pollen: Secondary | ICD-10-CM | POA: Diagnosis not present

## 2017-05-14 DIAGNOSIS — J3081 Allergic rhinitis due to animal (cat) (dog) hair and dander: Secondary | ICD-10-CM | POA: Diagnosis not present

## 2017-05-14 DIAGNOSIS — J3089 Other allergic rhinitis: Secondary | ICD-10-CM | POA: Diagnosis not present

## 2017-05-21 DIAGNOSIS — M65341 Trigger finger, right ring finger: Secondary | ICD-10-CM | POA: Diagnosis not present

## 2017-05-22 DIAGNOSIS — J301 Allergic rhinitis due to pollen: Secondary | ICD-10-CM | POA: Diagnosis not present

## 2017-05-22 DIAGNOSIS — J3081 Allergic rhinitis due to animal (cat) (dog) hair and dander: Secondary | ICD-10-CM | POA: Diagnosis not present

## 2017-05-22 DIAGNOSIS — J3089 Other allergic rhinitis: Secondary | ICD-10-CM | POA: Diagnosis not present

## 2017-05-23 ENCOUNTER — Other Ambulatory Visit: Payer: Self-pay | Admitting: Internal Medicine

## 2017-05-23 ENCOUNTER — Encounter (HOSPITAL_BASED_OUTPATIENT_CLINIC_OR_DEPARTMENT_OTHER): Payer: Self-pay | Admitting: *Deleted

## 2017-05-23 DIAGNOSIS — J3089 Other allergic rhinitis: Secondary | ICD-10-CM | POA: Diagnosis not present

## 2017-05-23 DIAGNOSIS — J3081 Allergic rhinitis due to animal (cat) (dog) hair and dander: Secondary | ICD-10-CM | POA: Diagnosis not present

## 2017-05-23 DIAGNOSIS — J301 Allergic rhinitis due to pollen: Secondary | ICD-10-CM | POA: Diagnosis not present

## 2017-05-23 MED ORDER — VALACYCLOVIR HCL 1 G PO TABS: ORAL_TABLET | ORAL | 4 refills | Status: DC

## 2017-05-23 MED FILL — valACYclovir HCL 1 GM TABS: 1 | 8 days supply | Qty: 30 | Fill #0

## 2017-05-23 NOTE — Telephone Encounter (Signed)
Pt needs a new rx valtrex 1000 mg . [facility] outpt pharm. Pt has cpx sch for dec 2018

## 2017-05-23 NOTE — Telephone Encounter (Signed)
This rx was last filled 12/12/15 by Barbra Sarks PA-C for qty:21 rf:2

## 2017-05-23 NOTE — Telephone Encounter (Signed)
Ok  To refill as my sig in past   I sent in

## 2017-05-23 NOTE — Telephone Encounter (Signed)
Pt was notified that Dr. Fabian Sharp sent in rx she did not have any further questions.

## 2017-06-01 NOTE — Anesthesia Preprocedure Evaluation (Addendum)
Anesthesia Evaluation  Patient identified by MRN, date of birth, ID band Patient awake    Reviewed: Allergy & Precautions, H&P , NPO status , Patient's Chart, lab work & pertinent test results  History of Anesthesia Complications Negative for: history of anesthetic complications  Airway Mallampati: I       Dental  (+) Teeth Intact   Pulmonary neg pulmonary ROS,    Pulmonary exam normal breath sounds clear to auscultation       Cardiovascular  Rhythm:regular Rate:Normal     Neuro/Psych  Headaches, negative neurological ROS  negative psych ROS   GI/Hepatic negative GI ROS, Neg liver ROS, GERD  ,  Endo/Other  negative endocrine ROS  Renal/GU      Musculoskeletal   Abdominal   Peds  Hematology negative hematology ROS (+)   Anesthesia Other Findings    Reproductive/Obstetrics negative OB ROS                           Anesthesia Physical  Anesthesia Plan  ASA: II  Anesthesia Plan: MAC and Bier Block   Post-op Pain Management:    Induction: Intravenous  PONV Risk Score and Plan: 2 and Ondansetron, Dexamethasone, Treatment may vary due to age or medical condition and Midazolam  Airway Management Planned: Mask, Natural Airway and Nasal Cannula  Additional Equipment:   Intra-op Plan:   Post-operative Plan:   Informed Consent: I have reviewed the patients History and Physical, chart, labs and discussed the procedure including the risks, benefits and alternatives for the proposed anesthesia with the patient or authorized representative who has indicated his/her understanding and acceptance.   Dental Advisory Given  Plan Discussed with: CRNA and Surgeon  Anesthesia Plan Comments:       Anesthesia Quick Evaluation

## 2017-06-02 ENCOUNTER — Encounter (HOSPITAL_BASED_OUTPATIENT_CLINIC_OR_DEPARTMENT_OTHER)
Admission: RE | Disposition: A | Discharge status: Home / Self Care | Payer: Self-pay | Source: Ambulatory Visit | Attending: Orthopedic Surgery

## 2017-06-02 ENCOUNTER — Ambulatory Visit (HOSPITAL_BASED_OUTPATIENT_CLINIC_OR_DEPARTMENT_OTHER)
Admission: RE | Admit: 2017-06-02 | Discharge: 2017-06-02 | Disposition: A | Discharge status: Home / Self Care | Payer: 59 | Source: Ambulatory Visit | Attending: Orthopedic Surgery | Admitting: Orthopedic Surgery

## 2017-06-02 ENCOUNTER — Ambulatory Visit (HOSPITAL_BASED_OUTPATIENT_CLINIC_OR_DEPARTMENT_OTHER): Payer: 59 | Admitting: Anesthesiology

## 2017-06-02 ENCOUNTER — Encounter (HOSPITAL_BASED_OUTPATIENT_CLINIC_OR_DEPARTMENT_OTHER): Payer: Self-pay

## 2017-06-02 DIAGNOSIS — M6530 Trigger finger, unspecified finger: Secondary | ICD-10-CM | POA: Insufficient documentation

## 2017-06-02 DIAGNOSIS — M65341 Trigger finger, right ring finger: Secondary | ICD-10-CM | POA: Diagnosis not present

## 2017-06-02 DIAGNOSIS — K219 Gastro-esophageal reflux disease without esophagitis: Secondary | ICD-10-CM | POA: Diagnosis not present

## 2017-06-02 DIAGNOSIS — M7541 Impingement syndrome of right shoulder: Secondary | ICD-10-CM | POA: Diagnosis not present

## 2017-06-02 DIAGNOSIS — Z79899 Other long term (current) drug therapy: Secondary | ICD-10-CM | POA: Insufficient documentation

## 2017-06-02 DIAGNOSIS — D649 Anemia, unspecified: Secondary | ICD-10-CM | POA: Diagnosis not present

## 2017-06-02 HISTORY — PX: TRIGGER FINGER RELEASE: SHX641

## 2017-06-02 SURGERY — RELEASE, A1 PULLEY, FOR TRIGGER FINGER
Anesthesia: Monitor Anesthesia Care | Site: Hand | Laterality: Right

## 2017-06-02 MED ORDER — FENTANYL CITRATE (PF) 100 MCG/2ML IJ SOLN: 25.0000 ug | INTRAMUSCULAR | Status: DC | PRN

## 2017-06-02 MED ORDER — CHLORHEXIDINE GLUCONATE 4 % EX LIQD: 60.0000 mL | Freq: Once | CUTANEOUS | Status: DC

## 2017-06-02 MED ORDER — ONDANSETRON HCL 4 MG/2ML IJ SOLN
INTRAMUSCULAR | Status: AC
  Filled 2017-06-02: qty 2

## 2017-06-02 MED ORDER — BUPIVACAINE HCL (PF) 0.25 % IJ SOLN
INTRAMUSCULAR | Status: AC
  Filled 2017-06-02: qty 30

## 2017-06-02 MED ORDER — BUPIVACAINE HCL (PF) 0.25 % IJ SOLN
INTRAMUSCULAR | Status: DC | PRN
  Administered 2017-06-02: 6 mL

## 2017-06-02 MED ORDER — MIDAZOLAM HCL 2 MG/2ML IJ SOLN
1.0000 mg | INTRAMUSCULAR | Status: DC | PRN
  Administered 2017-06-02: 2 mg via INTRAVENOUS

## 2017-06-02 MED ORDER — VANCOMYCIN HCL IN DEXTROSE 1-5 GM/200ML-% IV SOLN
INTRAVENOUS | Status: AC
  Filled 2017-06-02: qty 200

## 2017-06-02 MED ORDER — TRAMADOL HCL 50 MG PO TABS: ORAL_TABLET | ORAL | 0 refills | Status: DC

## 2017-06-02 MED ORDER — KETOROLAC TROMETHAMINE 30 MG/ML IJ SOLN
INTRAMUSCULAR | Status: AC
  Filled 2017-06-02: qty 1

## 2017-06-02 MED ORDER — MEPERIDINE HCL 25 MG/ML IJ SOLN: 6.2500 mg | INTRAMUSCULAR | Status: DC | PRN

## 2017-06-02 MED ORDER — ONDANSETRON HCL 4 MG/2ML IJ SOLN: 4.0000 mg | Freq: Once | INTRAMUSCULAR | Status: DC | PRN

## 2017-06-02 MED ORDER — LIDOCAINE HCL (PF) 0.5 % IJ SOLN
INTRAMUSCULAR | Status: DC | PRN
  Administered 2017-06-02: 30 mL via INTRAVENOUS

## 2017-06-02 MED ORDER — PROPOFOL 500 MG/50ML IV EMUL
INTRAVENOUS | Status: AC
  Filled 2017-06-02: qty 50

## 2017-06-02 MED ORDER — DEXAMETHASONE SODIUM PHOSPHATE 10 MG/ML IJ SOLN
INTRAMUSCULAR | Status: AC
  Filled 2017-06-02: qty 1

## 2017-06-02 MED ORDER — PROPOFOL 500 MG/50ML IV EMUL
INTRAVENOUS | Status: DC | PRN
  Administered 2017-06-02: 25 ug/kg/min via INTRAVENOUS

## 2017-06-02 MED ORDER — SCOPOLAMINE 1 MG/3DAYS TD PT72: 1.0000 | MEDICATED_PATCH | Freq: Once | TRANSDERMAL | Status: DC | PRN

## 2017-06-02 MED ORDER — VANCOMYCIN HCL IN DEXTROSE 1-5 GM/200ML-% IV SOLN
1000.0000 mg | INTRAVENOUS | Status: AC
  Administered 2017-06-02 (×2): 1000 mg via INTRAVENOUS

## 2017-06-02 MED ORDER — LACTATED RINGERS IV SOLN
INTRAVENOUS | Status: DC
  Administered 2017-06-02: 13:00:00 via INTRAVENOUS

## 2017-06-02 MED ORDER — FENTANYL CITRATE (PF) 100 MCG/2ML IJ SOLN
50.0000 ug | INTRAMUSCULAR | Status: DC | PRN
  Administered 2017-06-02 (×2): 50 ug via INTRAVENOUS

## 2017-06-02 MED ORDER — MIDAZOLAM HCL 2 MG/2ML IJ SOLN
INTRAMUSCULAR | Status: AC
  Filled 2017-06-02: qty 4

## 2017-06-02 MED FILL — traMADol HCL 50 MG TABS: 50 | 3 days supply | Qty: 20 | Fill #0

## 2017-06-02 SURGICAL SUPPLY — 36 items
BANDAGE COBAN STERILE 2 (GAUZE/BANDAGES/DRESSINGS) ×3 IMPLANT
BLADE SURG 15 STRL LF DISP TIS (BLADE) ×2 IMPLANT
BLADE SURG 15 STRL SS (BLADE) ×6
BNDG CMPR 9X4 STRL LF SNTH (GAUZE/BANDAGES/DRESSINGS)
BNDG COHESIVE 3X5 TAN STRL LF (GAUZE/BANDAGES/DRESSINGS) ×2 IMPLANT
BNDG ESMARK 4X9 LF (GAUZE/BANDAGES/DRESSINGS) IMPLANT
CHLORAPREP W/TINT 26ML (MISCELLANEOUS) ×3 IMPLANT
CORD BIPOLAR FORCEPS 12FT (ELECTRODE) ×3 IMPLANT
COVER BACK TABLE 60X90IN (DRAPES) ×3 IMPLANT
COVER MAYO STAND STRL (DRAPES) ×3 IMPLANT
CUFF TOURNIQUET SINGLE 18IN (TOURNIQUET CUFF) ×3 IMPLANT
DRAPE EXTREMITY T 121X128X90 (DRAPE) ×3 IMPLANT
DRAPE SURG 17X23 STRL (DRAPES) ×3 IMPLANT
GAUZE SPONGE 4X4 12PLY STRL (GAUZE/BANDAGES/DRESSINGS) ×3 IMPLANT
GAUZE SPONGE 4X4 12PLY STRL LF (GAUZE/BANDAGES/DRESSINGS) ×2 IMPLANT
GAUZE XEROFORM 1X8 LF (GAUZE/BANDAGES/DRESSINGS) ×3 IMPLANT
GLOVE BIO SURGEON STRL SZ 6.5 (GLOVE) ×1 IMPLANT
GLOVE BIO SURGEON STRL SZ7.5 (GLOVE) ×3 IMPLANT
GLOVE BIO SURGEONS STRL SZ 6.5 (GLOVE) ×1
GLOVE BIOGEL PI IND STRL 7.0 (GLOVE) IMPLANT
GLOVE BIOGEL PI IND STRL 8 (GLOVE) ×1 IMPLANT
GLOVE BIOGEL PI INDICATOR 7.0 (GLOVE) ×2
GLOVE BIOGEL PI INDICATOR 8 (GLOVE) ×2
GOWN STRL REUS W/ TWL LRG LVL3 (GOWN DISPOSABLE) ×1 IMPLANT
GOWN STRL REUS W/TWL LRG LVL3 (GOWN DISPOSABLE) ×3
GOWN STRL REUS W/TWL XL LVL3 (GOWN DISPOSABLE) ×3 IMPLANT
NDL HYPO 25X1 1.5 SAFETY (NEEDLE) ×1 IMPLANT
NEEDLE HYPO 25X1 1.5 SAFETY (NEEDLE) ×3 IMPLANT
NS IRRIG 1000ML POUR BTL (IV SOLUTION) ×3 IMPLANT
PACK BASIN DAY SURGERY FS (CUSTOM PROCEDURE TRAY) ×3 IMPLANT
STOCKINETTE 4X48 STRL (DRAPES) ×3 IMPLANT
SUT ETHILON 4 0 PS 2 18 (SUTURE) ×3 IMPLANT
SYR BULB 3OZ (MISCELLANEOUS) ×3 IMPLANT
SYR CONTROL 10ML LL (SYRINGE) ×3 IMPLANT
TOWEL OR 17X24 6PK STRL BLUE (TOWEL DISPOSABLE) ×6 IMPLANT
UNDERPAD 30X30 (UNDERPADS AND DIAPERS) ×3 IMPLANT

## 2017-06-02 NOTE — Anesthesia Postprocedure Evaluation (Signed)
Anesthesia Post Note  Patient: Valerie [name]  Procedure(s) Performed: RELEASE TRIGGER FINGER/A-1 PULLEY RIGHT RING TRIGGER RELEASE (Right Hand)     Patient location during evaluation: PACU Anesthesia Type: MAC Level of consciousness: awake and alert Pain management: pain level controlled Vital Signs Assessment: post-procedure vital signs reviewed and stable Respiratory status: spontaneous breathing, nonlabored ventilation, respiratory function stable and patient connected to nasal cannula oxygen Cardiovascular status: stable and blood pressure returned to baseline Postop Assessment: no apparent nausea or vomiting Anesthetic complications: no    Last Vitals:  Vitals:   06/02/17 1420 06/02/17 1421  BP:    Pulse: 77 72  Resp:  (!) 9  Temp:  (!) 36.4 C  SpO2: 100% 99%    Last Pain:  Vitals:   06/02/17 1241  TempSrc: Oral                 Albi Rappaport

## 2017-06-02 NOTE — Op Note (Signed)
06/02/2017 [facility]  Operative Note  PREOPERATIVE DIAGNOSIS: RIGHT RING TRIGGER DIGIT  POSTOPERATIVE DIAGNOSIS:  RIGHT RING TRIGGER DIGIT  PROCEDURE: Procedure(s): RELEASE TRIGGER FINGER/A-1 PULLEY RIGHT RING TRIGGER RELEASE  SURGEON:  Betha Loa, MD  ASSISTANT:  none.  ANESTHESIA:  Bier block and sedation.  IV FLUIDS:  Per anesthesia flow sheet.  ESTIMATED BLOOD LOSS:  Minimal.  COMPLICATIONS:  None.  SPECIMENS:  None.  TOURNIQUET TIME:  Total Tourniquet Time Documented: Forearm (Right) - 16 minutes Total: Forearm (Right) - 16 minutes   DISPOSITION:  Stable to PACU.  LOCATION: [facility]  INDICATIONS: Valerie [name] is a 51 y.o. female with triggering right ring finger.  Injected twice without lasting resolution.  She wishes to have a trigger release.  Risks, benefits and alternatives of surgery were discussed including the risk of blood loss, infection, damage to nerves, vessels, tendons, ligaments, bone, failure of surgery, need for additional surgery, complications with wound healing, continued pain, continued triggering and need for repeat surgery.  She voiced understanding of these risks and elected to proceed.  OPERATIVE COURSE:  After being identified preoperatively by myself, the patient and I agreed upon the procedure and site of procedure.  The surgical site was marked. The risks, benefits, and alternatives of surgery were reviewed and she wished to proceed.  Surgical consent had been signed. She was given IV vancomycin as preoperative antibiotic prophylaxis. She was transported to the operating room and placed on the operating room table in supine position with the Right upper extremity on an arm board. Bier block and sedation was induced by the anesthesiologist.  The Right upper extremity was prepped and draped in normal sterile orthopedic fashion. A surgical pause was performed between surgeons, anesthesia, and operating room staff,  and all were in agreement as to the patient, procedure, and site of procedure.  Tourniquet at the proximal aspect of the forearm had been inflated for the Bier block.  An incision was made at the volar aspect of the MP joint of the ring finger.  This was carried into the subcutaneous tissues by preading technique.  Bipolar electrocautery was used to obtain hemostasis.  The radial and ulnar digital nerves were protected throughout the case. The flexor sheath was identified.  The A1 pulley was identified and sharply incised.  It was released in its entirety.  The proximal 1-2 mm of the A2 pulley was vented to allow better excursion of the tendons.  The finger was placed through a range of motion and there was noted to be no catching.  The tendons were brought through the wound and any adherences released.  The wound was then copiously irrigated with sterile saline. It was closed with 4-0 nylon in a horizontal mattress fashion.  It was injected with 0.25% plain Marcaine to aid in postoperative analgesia.  It was dressed with sterile Xeroform, 4x4s, and wrapped lightly with a Coban dressing.  Tourniquet was deflated at 16 minutes.  The fingertips were pink with brisk capillary refill after deflation of the tourniquet.  The operative drapes were broken down and the patient was awoken from anesthesia safely.  She was transferred back to the stretcher and taken to the PACU in stable condition.   I will see her back in the office in 1 week for postoperative followup.  I will give her a prescription for tramadol 50 mg 1-2 tabs po q6 hours prn pain, dispense #20.    Tami Ribas, MD Electronically signed, 06/02/17

## 2017-06-02 NOTE — Brief Op Note (Signed)
06/02/2017  2:15 PM  PATIENT:  Valerie [name]  51 y.o. female  PRE-OPERATIVE DIAGNOSIS:  RIGHT RING TRIGGER DIGIT  POST-OPERATIVE DIAGNOSIS:  RIGHT RING TRIGGER DIGIT  PROCEDURE:  Procedure(s): RELEASE TRIGGER FINGER/A-1 PULLEY RIGHT RING TRIGGER RELEASE (Right)  SURGEON:  Surgeon(s) and Role:    * Betha Loa, MD - Primary  PHYSICIAN ASSISTANT:   ASSISTANTS: none   ANESTHESIA:   Bier block with sedation  EBL:  Total I/O In: 300 [I.V.:300] Out: -   BLOOD ADMINISTERED:none  DRAINS: none   LOCAL MEDICATIONS USED:  MARCAINE     SPECIMEN:  No Specimen  DISPOSITION OF SPECIMEN:  N/A  COUNTS:  YES  TOURNIQUET:   Total Tourniquet Time Documented: Forearm (Right) - 16 minutes Total: Forearm (Right) - 16 minutes   DICTATION: .Note written in EPIC  PLAN OF CARE: Discharge to home after PACU  PATIENT DISPOSITION:  PACU - hemodynamically stable.

## 2017-06-02 NOTE — Discharge Instructions (Addendum)

## 2017-06-02 NOTE — Anesthesia Procedure Notes (Signed)
Anesthesia Regional Block: Bier block (IV Regional)   Pre-Anesthetic Checklist: ,, timeout performed, Correct Patient, Correct Site, Correct Laterality, Correct Procedure,, site marked, surgical consent,, at surgeon's request Needles:  Injection technique: Single-shot  Needle Type: Other      Needle Gauge: 22     Additional Needles:   Procedures:,,,,, intact distal pulses, Esmarch exsanguination, single tourniquet utilized,  Narrative:  Start time: 06/02/2017 1:57 PM End time: 06/02/2017 1:57 PM Injection made incrementally with aspirations every 30 mL.  Performed by: Personally

## 2017-06-02 NOTE — Transfer of Care (Signed)
Immediate Anesthesia Transfer of Care Note  Patient: Valerie [name]  Procedure(s) Performed: RELEASE TRIGGER FINGER/A-1 PULLEY RIGHT RING TRIGGER RELEASE (Right Hand)  Patient Location: PACU  Anesthesia Type:MAC and Bier block  Level of Consciousness: sedated  Airway & Oxygen Therapy: Patient Spontanous Breathing  Post-op Assessment: Report given to RN and Post -op Vital signs reviewed and stable  Post vital signs: Reviewed and stable  Last Vitals:  Vitals:   06/02/17 1241  BP: 96/65  Pulse: 74  Resp: 18  Temp: 36.6 C  SpO2: 100%    Last Pain:  Vitals:   06/02/17 1241  TempSrc: Oral         Complications: No apparent anesthesia complications

## 2017-06-02 NOTE — H&P (Signed)
Valerie [name] is an 51 y.o. female.   Chief Complaint: right ring finger trigger digit HPI: 51 yo female with right ring finger trigger digit.  Injected x 2 without lasting resolution.  She wishes to have a trigger release.  Allergies:  Allergies  Allergen Reactions  . Augmentin [Amoxicillin-Pot Clavulanate] Diarrhea  . Ceftin [Cefuroxime Axetil] Other (See Comments)    Severe yeast infection  . Clarithromycin Other (See Comments)    Significant metallic taste  . Nsaids Nausea Only and Other (See Comments)    Abdominal pain / severe reflux    Past Medical History:  Diagnosis Date  . Allergy    cats  . Anemia    NOS  . 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 GERD (gastroesophageal reflux disease)   . HSV infection    labialis  . RECTAL BLEEDING 07/10/2007   Qualifier: Diagnosis of  By: Fabian Sharp MD, Neta Mends 2008 colonoscopy [facility].     Past Surgical History:  Procedure Laterality Date  . DILITATION & CURRETTAGE/HYSTROSCOPY WITH VERSAPOINT RESECTION  08/27/2012   Procedure: DILATATION & CURETTAGE/HYSTEROSCOPY WITH VERSAPOINT RESECTION;  Surgeon: Genia Del, MD;  Location: [facility];  Service: Gynecology;;  . LASIK  2009   mono vision    Family History: Family History  Problem Relation Age of Onset  . Diabetes Father   . Hyperlipidemia Father   . Heart disease Father   . Stroke Father   . Colon polyps Brother        elev TG  . Alcohol abuse Other   . Breast cancer Maternal Aunt   . Stroke Maternal Grandmother   . Diabetes Maternal Grandmother   . Heart disease Maternal Grandmother   . Diabetes Maternal Grandfather   . Hyperlipidemia Brother        elevated triglycerides  . Liver disease Maternal Uncle        alcohol related  . Colon cancer Neg Hx     Social History:   reports that she has never smoked. She has never used smokeless tobacco. She reports that she drinks alcohol. She reports that she does not use drugs.  Medications: Medications  Prior to Admission  Medication Sig Dispense Refill  . cetirizine (ZYRTEC) 10 MG tablet Take 10 mg by mouth daily as needed for allergies.    . cholecalciferol (VITAMIN D) 1000 UNITS tablet Take 1,000 Units by mouth daily.    Marland Kitchen gabapentin (NEURONTIN) 100 MG capsule Take 100 mg by mouth 3 (three) times daily.    . Multiple Vitamin (MULTIVITAMIN WITH MINERALS) TABS Take 1 tablet by mouth daily.    . Omega-3 Fatty Acids (FISH OIL) 1200 MG CAPS Take 1,200 mg by mouth daily.    . TURMERIC PO Take by mouth.    . valACYclovir (VALTREX) 1000 MG tablet Take 2 TABLETS BY MOUTH AND REPEATIN 12 HOURS OR AS DIRECTED 30 tablet 4  . EPINEPHrine 0.3 mg/0.3 mL IJ SOAJ injection as needed. Reported on 11/16/2015  1  . methocarbamol (ROBAXIN) 500 MG tablet Take 500 mg by mouth 4 (four) times daily.      No results found for this or any previous visit (from the past 48 hour(s)).  No results found.   A comprehensive review of systems was negative.  Blood pressure 96/65, pulse 74, temperature 97.9 F (36.6 C), temperature source Oral, resp. rate 18, height 5' 3.5" (1.613 m), weight 57.3 kg (126 lb 6.4 oz), last menstrual period 11/28/2014, SpO2 100 %.  General appearance: alert, cooperative and appears stated age Head: Normocephalic, without obvious abnormality, atraumatic Neck: supple, symmetrical, trachea midline Resp: clear to auscultation bilaterally Cardio: regular rate and rhythm GI: non-tender Extremities: Intact sensation and capillary refill all digits.  +epl/fpl/io.  No wounds.  Pulses: 2+ and symmetric Skin: Skin color, texture, turgor normal. No rashes or lesions Neurologic: Grossly normal Incision/Wound:none  Assessment/Plan Right ring finger trigger digit.  Non operative and operative treatment options were discussed with the patient and patient wishes to proceed with operative treatment. Risks, benefits, and alternatives of surgery were discussed and the patient agrees with the plan of  care.   Einar Nolasco R 06/02/2017, 1:36 PM

## 2017-06-03 ENCOUNTER — Encounter (HOSPITAL_BASED_OUTPATIENT_CLINIC_OR_DEPARTMENT_OTHER): Payer: Self-pay | Admitting: Orthopedic Surgery

## 2017-06-04 DIAGNOSIS — J301 Allergic rhinitis due to pollen: Secondary | ICD-10-CM | POA: Diagnosis not present

## 2017-06-04 DIAGNOSIS — J3089 Other allergic rhinitis: Secondary | ICD-10-CM | POA: Diagnosis not present

## 2017-06-04 DIAGNOSIS — J3081 Allergic rhinitis due to animal (cat) (dog) hair and dander: Secondary | ICD-10-CM | POA: Diagnosis not present

## 2017-06-09 DIAGNOSIS — J3081 Allergic rhinitis due to animal (cat) (dog) hair and dander: Secondary | ICD-10-CM | POA: Diagnosis not present

## 2017-06-09 DIAGNOSIS — J301 Allergic rhinitis due to pollen: Secondary | ICD-10-CM | POA: Diagnosis not present

## 2017-06-09 DIAGNOSIS — J3089 Other allergic rhinitis: Secondary | ICD-10-CM | POA: Diagnosis not present

## 2017-06-16 DIAGNOSIS — J3081 Allergic rhinitis due to animal (cat) (dog) hair and dander: Secondary | ICD-10-CM | POA: Diagnosis not present

## 2017-06-16 DIAGNOSIS — J3089 Other allergic rhinitis: Secondary | ICD-10-CM | POA: Diagnosis not present

## 2017-06-16 DIAGNOSIS — J301 Allergic rhinitis due to pollen: Secondary | ICD-10-CM | POA: Diagnosis not present

## 2017-07-16 ENCOUNTER — Ambulatory Visit: Payer: 59 | Admitting: Family Medicine

## 2017-07-23 DIAGNOSIS — J3081 Allergic rhinitis due to animal (cat) (dog) hair and dander: Secondary | ICD-10-CM | POA: Diagnosis not present

## 2017-07-23 DIAGNOSIS — J301 Allergic rhinitis due to pollen: Secondary | ICD-10-CM | POA: Diagnosis not present

## 2017-07-23 DIAGNOSIS — J3089 Other allergic rhinitis: Secondary | ICD-10-CM | POA: Diagnosis not present

## 2017-07-30 DIAGNOSIS — J301 Allergic rhinitis due to pollen: Secondary | ICD-10-CM | POA: Diagnosis not present

## 2017-07-30 DIAGNOSIS — J3089 Other allergic rhinitis: Secondary | ICD-10-CM | POA: Diagnosis not present

## 2017-07-30 DIAGNOSIS — J3081 Allergic rhinitis due to animal (cat) (dog) hair and dander: Secondary | ICD-10-CM | POA: Diagnosis not present

## 2017-08-06 ENCOUNTER — Ambulatory Visit: Payer: 59 | Admitting: Family Medicine

## 2017-08-06 ENCOUNTER — Encounter: Payer: Self-pay | Admitting: Family Medicine

## 2017-08-06 VITALS — BP 118/78 | HR 79 | Ht 63.5 in | Wt 129.0 lb

## 2017-08-06 DIAGNOSIS — M542 Cervicalgia: Secondary | ICD-10-CM | POA: Diagnosis not present

## 2017-08-06 DIAGNOSIS — M999 Biomechanical lesion, unspecified: Secondary | ICD-10-CM

## 2017-08-06 NOTE — Assessment & Plan Note (Signed)
Decision today to treat with OMT was based on Physical Exam  After verbal consent patient was treated with HVLA, ME, FPR techniques in cervical, thoracic, rib, lareas  Patient tolerated the procedure well with improvement in symptoms  Patient given exercises, stretches and lifestyle modifications  See medications in patient instructions if given  Patient will follow up in 3-4 weeks

## 2017-08-06 NOTE — Progress Notes (Signed)
Tawana ScaleZach Genell Thede D.O. [facility] [address] [place], [place] [address] Phone: [phone] Subjective:     CC: Back and neck pain follow-up  OZH:[id number]:Subjective  Valerie [name] is a 51 y.o. female coming in for follow up for back pain. She said that her shoulders and back are tight and that the weather contributes to her tightness. She is no better and no worse than last visit.  Patient has responded well to manipulation.  Not taking any medications except Tylenol intermittently.       Past Medical History:  Diagnosis Date  . Allergy    cats  . Anemia    NOS  . 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 GERD (gastroesophageal reflux disease)   . HSV infection    labialis  . RECTAL BLEEDING 07/10/2007   Qualifier: Diagnosis of  By: [name], Neta MendsWanda K 2008 colonoscopy [facility].    Past Surgical History:  Procedure Laterality Date  . DILITATION & CURRETTAGE/HYSTROSCOPY WITH VERSAPOINT RESECTION  08/27/2012   Procedure: DILATATION & CURETTAGE/HYSTEROSCOPY WITH VERSAPOINT RESECTION;  Surgeon: Genia DelMarie-Lyne Lavoie, MD;  Location: [facility];  Service: Gynecology;;  . LASIK  2009   mono vision  . TRIGGER FINGER RELEASE Right 06/02/2017   Procedure: RELEASE TRIGGER FINGER/A-1 PULLEY RIGHT RING TRIGGER RELEASE;  Surgeon: Betha LoaKuzma, Kevin, MD;  Location: [facility];  Service: Orthopedics;  Laterality: Right;   Social History   Socioeconomic History  . Marital status: Married    Spouse name: None  . Number of children: None  . Years of education: None  . Highest education level: None  Social Needs  . Financial resource strain: None  . Food insecurity - worry: None  . Food insecurity - inability: None  . Transportation needs - medical: None  . Transportation needs - non-medical: None  Occupational History  . Occupation: PA    Employer: [employer]    Comment: Rehab  Tobacco Use  . Smoking status: Never Smoker  . Smokeless tobacco: Never Used  Substance and  Sexual Activity  . Alcohol use: Yes    Alcohol/week: 0.0 oz    Comment: occasional  . Drug use: No  . Sexual activity: None  Other Topics Concern  . 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 Augmentin [Amoxicillin-Pot Clavulanate] Diarrhea  . Ceftin [Cefuroxime Axetil] Other (See Comments)    Severe yeast infection  . Clarithromycin Other (See Comments)    Significant metallic taste  . Nsaids Nausea Only and Other (See Comments)    Abdominal pain / severe reflux   Family History  Problem Relation Age of Onset  . Diabetes Father   . Hyperlipidemia Father   . Heart disease Father   . Stroke Father   . Colon polyps Brother        elev TG  . Alcohol abuse Other   . Breast cancer Maternal Aunt   . Stroke Maternal Grandmother   . Diabetes Maternal Grandmother   . Heart disease Maternal Grandmother   . Diabetes Maternal Grandfather   . Hyperlipidemia Brother        elevated triglycerides  . Liver disease Maternal Uncle        alcohol related  .  Colon cancer Neg Hx      Past medical history, social, surgical and family history all reviewed in electronic medical record.  No pertanent information unless stated regarding to the chief complaint.   Review of Systems:Review of systems updated and as accurate as of 08/06/17  No headache, visual changes, nausea, vomiting, diarrhea, constipation, dizziness, abdominal pain, skin rash, fevers, chills, night sweats, weight loss, swollen lymph nodes, body aches, joint swelling, muscle aches, chest pain, shortness of breath, mood changes.   Objective  Blood pressure 118/78, pulse 79, height 5' 3.5" (1.613 m), weight 129 lb (58.5 kg), last menstrual period 11/28/2014, SpO2 99 %. Systems examined below as of 08/06/17   General: No apparent distress alert and  oriented x3 mood and affect normal, dressed appropriately.  HEENT: Pupils equal, extraocular movements intact  Respiratory: Patient's speak in full sentences and does not appear short of breath  Cardiovascular: No lower extremity edema, non tender, no erythema  Skin: Warm dry intact with no signs of infection or rash on extremities or on axial skeleton.  Abdomen: Soft nontender  Neuro: Cranial nerves II through XII are intact, neurovascularly intact in all extremities with 2+ DTRs and 2+ pulses.  Lymph: No lymphadenopathy of posterior or anterior cervical chain or axillae bilaterally.  Gait normal with good balance and coordination.  MSK:  Non tender with full range of motion and good stability and symmetric strength and tone of shoulders, elbows, wrist, hip, knee and ankles bilaterally.  Neck: Inspection loss of lordosis. No palpable stepoffs. Negative Spurling's maneuver. Loss of range of motion lacking last 5 degrees of sidebending. Grip strength and sensation normal in bilateral hands Strength good C4 to T1 distribution No sensory change to C4 to T1 Negative Hoffman sign bilaterally Reflexes normal Tightness of the right trapezius  Osteopathic findings C2 flexed rotated and side bent right C4 flexed rotated and side bent left C7 flexed rotated and side bent left T3 extended rotated and side bent right inhaled third rib T6 extended rotated and side bent left  L2 F RS right    Impression and Recommendations:     This case required medical decision making of moderate complexity.      Note: This dictation was prepared with Dragon dictation along with smaller phrase technology. Any transcriptional errors that result from this process are unintentional.

## 2017-08-06 NOTE — Patient Instructions (Signed)
Good to see you  Valerie [name] is your friend.  You are doing great  Posture is the key.  See me again in 3 months Happy New Year!

## 2017-08-06 NOTE — Assessment & Plan Note (Signed)
Patient continues to have some neck pain.  No trapezius still.  Patient does not want any type of trigger points at this time.  We discussed icing regimen, home exercises, which activities of doing which wants to avoid.  Patient will slowly increase activity over the course the next several days.  Patient will follow up with me again in 3 months

## 2017-08-11 NOTE — Progress Notes (Signed)
Chief Complaint  Patient presents with  . Annual Exam    HPI: Patient  Valerie [name]  51 y.o. comes in today for Preventive Health Care visit  No major changes  Due for pap Dr Lonia Skinner and to get [facility]    Health Maintenance  Topic Date Due  . PAP SMEAR  08/19/2016  . HIV Screening  08/13/2018 (Originally 02/07/1981)  . MAMMOGRAM  09/18/2018  . TETANUS/TDAP  12/04/2024  . COLONOSCOPY  11/15/2025  . INFLUENZA VACCINE  Completed   Health Maintenance Review LIFESTYLE:  Exercise:   Decrease  Once a week.  Tobacco/ETS: no Alcohol:  Once a  few months   Sugar beverages:  Agave  Sleep: 5-6  Drug use: no HH of  2 1 cat  Work:same  About 9- 10 per day    ROS:  GEN/ HEENT: No fever, significant weight changes sweats headaches vision problems hearing changes, CV/ PULM; No chest pain shortness of breath cough, syncope,edema  change in exercise tolerance. GI /GU: No adominal pain, vomiting, change in bowel habits. No blood in the stool. No significant GU symptoms. SKIN/HEME: ,no acute skin rashes suspicious lesions or bleeding. No lymphadenopathy, nodules, masses.  NEURO/ PSYCH:  No neurologic signs such as weakness numbness. No depression anxiety. IMM/ Allergy: No unusual infections.  Allergy .   REST of 12 system review negative except as per HPI   Past Medical History:  Diagnosis Date  . Allergy    cats  . Anemia    NOS  . 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 GERD (gastroesophageal reflux disease)   . HSV infection    labialis  . RECTAL BLEEDING 07/10/2007   Qualifier: Diagnosis of  By: Regis Bill MD, Standley Brooking 2008 colonoscopy [place].     Past Surgical History:  Procedure Laterality Date  . CYST REMOVAL HAND Right   . DILITATION & CURRETTAGE/HYSTROSCOPY WITH VERSAPOINT RESECTION  08/27/2012   Procedure: DILATATION & CURETTAGE/HYSTEROSCOPY WITH VERSAPOINT RESECTION;  Surgeon: Princess Bruins, MD;  Location: [facility];  Service: Gynecology;;  . LASIK  2009   mono  vision  . TRIGGER FINGER RELEASE Right 06/02/2017   Procedure: RELEASE TRIGGER FINGER/A-1 PULLEY RIGHT RING TRIGGER RELEASE;  Surgeon: Leanora Cover, MD;  Location: [facility];  Service: Orthopedics;  Laterality: Right;    Family History  Problem Relation Age of Onset  . Diabetes Father   . Hyperlipidemia Father   . Heart disease Father   . Stroke Father   . Colon polyps Brother        elev TG  . Alcohol abuse Other   . Breast cancer Maternal Aunt   . Stroke Maternal Grandmother   . Diabetes Maternal Grandmother   . Heart disease Maternal Grandmother   . Diabetes Maternal Grandfather   . Hyperlipidemia Brother        elevated triglycerides  . Liver disease Maternal Uncle        alcohol related  . Colon cancer Neg Hx     Social History   Socioeconomic History  . Marital status: Married    Spouse name: None  . Number of children: None  . Years of education: None  . Highest education level: None  Social Needs  . Financial resource strain: None  . Food insecurity - worry: None  . Food insecurity - inability: None  . Transportation needs - medical: None  . Transportation needs - non-medical: None  Occupational History  .  Occupation: PA    Employer: [employer]    Comment: Rehab  Tobacco Use  . Smoking status: Never Smoker  . Smokeless tobacco: Never Used  Substance and Sexual Activity  . Alcohol use: Yes    Alcohol/week: 0.0 oz    Comment: occasional  . Drug use: No  . Sexual activity: None  Other Topics Concern  . 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 cetirizine (ZYRTEC) 10 MG tablet Take 10 mg by mouth daily as needed for allergies.    . cholecalciferol (VITAMIN D) 1000 UNITS tablet Take 1,000 Units by mouth daily.    Marland Kitchen  EPINEPHrine 0.3 mg/0.3 mL IJ SOAJ injection as needed. Reported on 11/16/2015  1  . Multiple Vitamin (MULTIVITAMIN WITH MINERALS) TABS Take 1 tablet by mouth daily.    . Omega-3 Fatty Acids (FISH OIL) 1200 MG CAPS Take 1,200 mg by mouth daily.    . TURMERIC PO Take by mouth.    . valACYclovir (VALTREX) 1000 MG tablet Take 2 TABLETS BY MOUTH AND REPEATIN 12 HOURS OR AS DIRECTED 30 tablet 4   No facility-administered medications prior to visit.      EXAM:  BP 110/70 (BP Location: Right Arm, Patient Position: Sitting, Cuff Size: Normal)   Pulse 67   Temp 98.3 F (36.8 C) (Oral)   Ht 5' 3.5" (1.613 m)   Wt 128 lb 6.4 oz (58.2 kg)   LMP 11/28/2014   BMI 22.39 kg/m   Body mass index is 22.39 kg/m. Wt Readings from Last 3 Encounters:  08/13/17 128 lb 6.4 oz (58.2 kg)  08/06/17 129 lb (58.5 kg)  06/02/17 126 lb 6.4 oz (57.3 kg)    Physical Exam: Vital signs reviewed UYQ:IHKV is a well-developed well-nourished alert cooperative    who appearsr stated age in no acute distress.  HEENT: normocephalic atraumatic , Eyes: PERRL EOM's full, conjunctiva clear, Nares: paten,t no deformity discharge or tenderness., Ears: no deformity EAC's clear TMs with normal landmarks. Mouth: clear OP, no lesions, edema.  Moist mucous membranes. Dentition in adequate repair. NECK: supple without masses, thyromegaly or bruits. CHEST/PULM:  Clear to auscultation and percussion breath sounds equal no wheeze , rales or rhonchi. No chest wall deformities or tenderness. Breast: normal by inspection . No dimpling, discharge, masses, tenderness or discharge . CV: PMI is nondisplaced, S1 S2 no gallops, murmurs, rubs. Peripheral pulses are full without delay.No JVD .  ABDOMEN: Bowel sounds normal nontender  No guard or rebound, no hepato splenomegal no CVA tenderness.  No hernia. Extremtities:  No clubbing cyanosis or edema, no acute joint swelling or redness no focal atrophy NEURO:  Oriented x3, cranial nerves 3-12  appear to be intact, no obvious focal weakness,gait within normal limits no abnormal reflexes or asymmetrical SKIN: No acute rashes normal turgor, color, no bruising or petechiae. PSYCH: Oriented, good eye contact, no obvious depression anxiety, cognition and judgment appear normal. LN: no cervical axillary  adenopathy  Lab Results  Component Value Date   WBC 3.4 (L) 01/31/2016   HGB 12.8 01/31/2016   HCT 37.9 01/31/2016   PLT 213.0 01/31/2016   GLUCOSE 87 01/31/2016   CHOL 209 (H) 01/31/2016   TRIG 53.0  01/31/2016   HDL 78.70 01/31/2016   LDLCALC 120 (H) 01/31/2016   ALT 14 01/31/2016   AST 16 01/31/2016   NA 138 01/31/2016   K 4.2 01/31/2016   CL 104 01/31/2016   CREATININE 0.72 01/31/2016   BUN 15 01/31/2016   CO2 29 01/31/2016   TSH 1.23 01/31/2016    BP Readings from Last 3 Encounters:  08/13/17 110/70  08/06/17 118/78  06/02/17 109/89    Lab results reviewed with patient   ASSESSMENT AND PLAN:  Discussed the following assessment and plan:  Visit for preventive health examination - Plan: Basic metabolic panel, CBC with Differential/Platelet, Hepatic function panel, Lipid panel, TSH  Hyperlipidemia, unspecified hyperlipidemia type - Plan: Basic metabolic panel, CBC with Differential/Platelet, Hepatic function panel, Lipid panel, TSH reviewed  HCM and  Attention to LSI  Patient Care Team: Burnis Medin, MD as PCP - General Mosetta Anis, MD (Allergy) Princess Bruins, MD as Attending Physician (Obstetrics and Gynecology) Lyndal Pulley, DO as Attending Physician (Sports Medicine) Patient Instructions  Attending to sleep .  May help your health in the long run.   Get   Gyne check up .   Will notify you  of labs when available.  If all ok then yearly cpx    Health Maintenance, Female Adopting a healthy lifestyle and getting preventive care can go a long way to promote health and wellness. Talk with your health care provider about what schedule of  regular examinations is right for you. This is a good chance for you to check in with your provider about disease prevention and staying healthy. In between checkups, there are plenty of things you can do on your own. Experts have done a lot of research about which lifestyle changes and preventive measures are most likely to keep you healthy. Ask your health care provider for more information. Weight and diet Eat a healthy diet  Be sure to include plenty of vegetables, fruits, low-fat dairy products, and lean protein.  Do not eat a lot of foods high in solid fats, added sugars, or salt.  Get regular exercise. This is one of the most important things you can do for your health. ? Most adults should exercise for at least 150 minutes each week. The exercise should increase your heart rate and make you sweat (moderate-intensity exercise). ? Most adults should also do strengthening exercises at least twice a week. This is in addition to the moderate-intensity exercise.  Maintain a healthy weight  Body mass index (BMI) is a measurement that can be used to identify possible weight problems. It estimates body fat based on height and weight. Your health care provider can help determine your BMI and help you achieve or maintain a healthy weight.  For females 58 years of age and older: ? A BMI below 18.5 is considered underweight. ? A BMI of 18.5 to 24.9 is normal. ? A BMI of 25 to 29.9 is considered overweight. ? A BMI of 30 and above is considered obese.  Watch levels of cholesterol and blood lipids  You should start having your blood tested for lipids and cholesterol at 52 years of age, then have this test every 5 years.  You may need to have your cholesterol levels checked more often if: ? Your lipid or cholesterol levels are high. ? You are older than 52 years of age. ? You are at high risk for heart disease.  Cancer screening Lung Cancer  Lung cancer screening is recommended  for adults  77-55 years old who are at high risk for lung cancer because of a history of smoking.  A yearly low-dose CT scan of the lungs is recommended for people who: ? Currently smoke. ? Have quit within the past 15 years. ? Have at least a 30-pack-year history of smoking. A pack year is smoking an average of one pack of cigarettes a day for 1 year.  Yearly screening should continue until it has been 15 years since you quit.  Yearly screening should stop if you develop a health problem that would prevent you from having lung cancer treatment.  Breast Cancer  Practice breast self-awareness. This means understanding how your breasts normally appear and feel.  It also means doing regular breast self-exams. Let your health care provider know about any changes, no matter how small.  If you are in your 20s or 30s, you should have a clinical breast exam (CBE) by a health care provider every 1-3 years as part of a regular health exam.  If you are 27 or older, have a CBE every year. Also consider having a breast X-ray (mammogram) every year.  If you have a family history of breast cancer, talk to your health care provider about genetic screening.  If you are at high risk for breast cancer, talk to your health care provider about having an MRI and a mammogram every year.  Breast cancer gene (BRCA) assessment is recommended for women who have family members with BRCA-related cancers. BRCA-related cancers include: ? Breast. ? Ovarian. ? Tubal. ? Peritoneal cancers.  Results of the assessment will determine the need for genetic counseling and BRCA1 and BRCA2 testing.  Cervical Cancer Your health care provider may recommend that you be screened regularly for cancer of the pelvic organs (ovaries, uterus, and vagina). This screening involves a pelvic examination, including checking for microscopic changes to the surface of your cervix (Pap test). You may be encouraged to have this screening done every 3  years, beginning at age 45.  For women ages 6-65, health care providers may recommend pelvic exams and Pap testing every 3 years, or they may recommend the Pap and pelvic exam, combined with testing for human papilloma virus (HPV), every 5 years. Some types of HPV increase your risk of cervical cancer. Testing for HPV may also be done on women of any age with unclear Pap test results.  Other health care providers may not recommend any screening for nonpregnant women who are considered low risk for pelvic cancer and who do not have symptoms. Ask your health care provider if a screening pelvic exam is right for you.  If you have had past treatment for cervical cancer or a condition that could lead to cancer, you need Pap tests and screening for cancer for at least 20 years after your treatment. If Pap tests have been discontinued, your risk factors (such as having a new sexual partner) need to be reassessed to determine if screening should resume. Some women have medical problems that increase the chance of getting cervical cancer. In these cases, your health care provider may recommend more frequent screening and Pap tests.  Colorectal Cancer  This type of cancer can be detected and often prevented.  Routine colorectal cancer screening usually begins at 52 years of age and continues through 52 years of age.  Your health care provider may recommend screening at an earlier age if you have risk factors for colon cancer.  Your health care provider may also  recommend using home test kits to check for hidden blood in the stool.  A small camera at the end of a tube can be used to examine your colon directly (sigmoidoscopy or colonoscopy). This is done to check for the earliest forms of colorectal cancer.  Routine screening usually begins at age 65.  Direct examination of the colon should be repeated every 5-10 years through 52 years of age. However, you may need to be screened more often if early  forms of precancerous polyps or small growths are found.  Skin Cancer  Check your skin from head to toe regularly.  Tell your health care provider about any new moles or changes in moles, especially if there is a change in a mole's shape or color.  Also tell your health care provider if you have a mole that is larger than the size of a pencil eraser.  Always use sunscreen. Apply sunscreen liberally and repeatedly throughout the day.  Protect yourself by wearing long sleeves, pants, a wide-brimmed hat, and sunglasses whenever you are outside.  Heart disease, diabetes, and high blood pressure  High blood pressure causes heart disease and increases the risk of stroke. High blood pressure is more likely to develop in: ? People who have blood pressure in the high end of the normal range (130-139/85-89 mm Hg). ? People who are overweight or obese. ? People who are African American.  If you are 60-42 years of age, have your blood pressure checked every 3-5 years. If you are 74 years of age or older, have your blood pressure checked every year. You should have your blood pressure measured twice-once when you are at a hospital or clinic, and once when you are not at a hospital or clinic. Record the average of the two measurements. To check your blood pressure when you are not at a hospital or clinic, you can use: ? An automated blood pressure machine at a pharmacy. ? A home blood pressure monitor.  If you are between 21 years and 32 years old, ask your health care provider if you should take aspirin to prevent strokes.  Have regular diabetes screenings. This involves taking a blood sample to check your fasting blood sugar level. ? If you are at a normal weight and have a low risk for diabetes, have this test once every three years after 52 years of age. ? If you are overweight and have a high risk for diabetes, consider being tested at a younger age or more often. Preventing infection Hepatitis  B  If you have a higher risk for hepatitis B, you should be screened for this virus. You are considered at high risk for hepatitis B if: ? You were born in a country where hepatitis B is common. Ask your health care provider which countries are considered high risk. ? Your parents were born in a high-risk country, and you have not been immunized against hepatitis B (hepatitis B vaccine). ? You have HIV or AIDS. ? You use needles to inject street drugs. ? You live with someone who has hepatitis B. ? You have had sex with someone who has hepatitis B. ? You get hemodialysis treatment. ? You take certain medicines for conditions, including cancer, organ transplantation, and autoimmune conditions.  Hepatitis C  Blood testing is recommended for: ? Everyone born from [birth year] through 1966. ? Anyone with known risk factors for hepatitis C.  Sexually transmitted infections (STIs)  You should be screened for sexually transmitted infections (STIs) including  gonorrhea and chlamydia if: ? You are sexually active and are younger than 52 years of age. ? You are older than 52 years of age and your health care provider tells you that you are at risk for this type of infection. ? Your sexual activity has changed since you were last screened and you are at an increased risk for chlamydia or gonorrhea. Ask your health care provider if you are at risk.  If you do not have HIV, but are at risk, it may be recommended that you take a prescription medicine daily to prevent HIV infection. This is called pre-exposure prophylaxis (PrEP). You are considered at risk if: ? You are sexually active and do not regularly use condoms or know the HIV status of your partner(s). ? You take drugs by injection. ? You are sexually active with a partner who has HIV.  Talk with your health care provider about whether you are at high risk of being infected with HIV. If you choose to begin PrEP, you should first be tested for HIV. You  should then be tested every 3 months for as long as you are taking PrEP. Pregnancy  If you are premenopausal and you may become pregnant, ask your health care provider about preconception counseling.  If you may become pregnant, take 400 to 800 micrograms (mcg) of folic acid every day.  If you want to prevent pregnancy, talk to your health care provider about birth control (contraception). Osteoporosis and menopause  Osteoporosis is a disease in which the bones lose minerals and strength with aging. This can result in serious bone fractures. Your risk for osteoporosis can be identified using a bone density scan.  If you are 51 years of age or older, or if you are at risk for osteoporosis and fractures, ask your health care provider if you should be screened.  Ask your health care provider whether you should take a calcium or vitamin D supplement to lower your risk for osteoporosis.  Menopause may have certain physical symptoms and risks.  Hormone replacement therapy may reduce some of these symptoms and risks. Talk to your health care provider about whether hormone replacement therapy is right for you. Follow these instructions at home:  Schedule regular health, dental, and eye exams.  Stay current with your immunizations.  Do not use any tobacco products including cigarettes, chewing tobacco, or electronic cigarettes.  If you are pregnant, do not drink alcohol.  If you are breastfeeding, limit how much and how often you drink alcohol.  Limit alcohol intake to no more than 1 drink per day for nonpregnant women. One drink equals 12 ounces of beer, 5 ounces of wine, or 1 ounces of hard liquor.  Do not use street drugs.  Do not share needles.  Ask your health care provider for help if you need support or information about quitting drugs.  Tell your health care provider if you often feel depressed.  Tell your health care provider if you have ever been abused or do not feel safe  at home. This information is not intended to replace advice given to you by your health care provider. Make sure you discuss any questions you have with your health care provider. Document Released: 02/18/2011 Document Revised: 01/11/2016 Document Reviewed: 05/09/2015 Elsevier Interactive Patient Education  2018 [employer]. Klynn Linnemann M.D.

## 2017-08-13 ENCOUNTER — Ambulatory Visit (INDEPENDENT_AMBULATORY_CARE_PROVIDER_SITE_OTHER): Payer: 59 | Admitting: Internal Medicine

## 2017-08-13 ENCOUNTER — Encounter: Payer: Self-pay | Admitting: Internal Medicine

## 2017-08-13 VITALS — BP 110/70 | HR 67 | Temp 98.3°F | Ht 63.5 in | Wt 128.4 lb

## 2017-08-13 DIAGNOSIS — Z0000 Encounter for general adult medical examination without abnormal findings: Secondary | ICD-10-CM | POA: Diagnosis not present

## 2017-08-13 DIAGNOSIS — J3081 Allergic rhinitis due to animal (cat) (dog) hair and dander: Secondary | ICD-10-CM | POA: Diagnosis not present

## 2017-08-13 DIAGNOSIS — J3089 Other allergic rhinitis: Secondary | ICD-10-CM | POA: Diagnosis not present

## 2017-08-13 DIAGNOSIS — E785 Hyperlipidemia, unspecified: Secondary | ICD-10-CM

## 2017-08-13 DIAGNOSIS — J301 Allergic rhinitis due to pollen: Secondary | ICD-10-CM | POA: Diagnosis not present

## 2017-08-13 LAB — CBC WITH DIFFERENTIAL/PLATELET
BASOS PCT: 0.4 % (ref 0.0–3.0)
Basophils Absolute: 0 10*3/uL (ref 0.0–0.1)
EOS ABS: 0.1 10*3/uL (ref 0.0–0.7)
EOS PCT: 1.7 % (ref 0.0–5.0)
HCT: 38 % (ref 36.0–46.0)
HEMOGLOBIN: 12.6 g/dL (ref 12.0–15.0)
LYMPHS ABS: 1.7 10*3/uL (ref 0.7–4.0)
Lymphocytes Relative: 43.3 % (ref 12.0–46.0)
MCHC: 33.2 g/dL (ref 30.0–36.0)
MCV: 89.1 fl (ref 78.0–100.0)
MONO ABS: 0.3 10*3/uL (ref 0.1–1.0)
Monocytes Relative: 6.4 % (ref 3.0–12.0)
NEUTROS PCT: 48.2 % (ref 43.0–77.0)
Neutro Abs: 1.9 10*3/uL (ref 1.4–7.7)
Platelets: 198 10*3/uL (ref 150.0–400.0)
RBC: 4.26 Mil/uL (ref 3.87–5.11)
RDW: 12.8 % (ref 11.5–15.5)
WBC: 3.9 10*3/uL — AB (ref 4.0–10.5)

## 2017-08-13 LAB — BASIC METABOLIC PANEL
BUN: 12 mg/dL (ref 6–23)
CO2: 29 mEq/L (ref 19–32)
CREATININE: 0.69 mg/dL (ref 0.40–1.20)
Calcium: 8.9 mg/dL (ref 8.4–10.5)
Chloride: 104 mEq/L (ref 96–112)
GFR: 94.77 mL/min (ref >60.00)
GLUCOSE: 86 mg/dL (ref 70–99)
Potassium: 3.8 mEq/L (ref 3.5–5.1)
Sodium: 139 mEq/L (ref 135–145)

## 2017-08-13 LAB — LIPID PANEL
CHOL/HDL RATIO: 3
CHOLESTEROL: 172 mg/dL (ref 0–200)
HDL: 61.9 mg/dL (ref >39.00)
LDL CALC: 98 mg/dL (ref 0–99)
NonHDL: 109.91
TRIGLYCERIDES: 61 mg/dL (ref 0.0–149.0)
VLDL: 12.2 mg/dL (ref 0.0–40.0)

## 2017-08-13 LAB — HEPATIC FUNCTION PANEL
ALT: 15 U/L (ref 0–35)
AST: 18 U/L (ref 0–37)
Albumin: 4.2 g/dL (ref 3.5–5.2)
Alkaline Phosphatase: 64 U/L (ref 39–117)
BILIRUBIN DIRECT: 0.1 mg/dL (ref 0.0–0.3)
BILIRUBIN TOTAL: 0.5 mg/dL (ref 0.2–1.2)
Total Protein: 6.7 g/dL (ref 6.0–8.3)

## 2017-08-13 LAB — TSH: TSH: 1.03 u[IU]/mL (ref 0.35–4.50)

## 2017-08-13 NOTE — Patient Instructions (Addendum)
Attending to sleep .  May help your health in the long run.   Get   Gyne check up .   Will notify you  of labs when available.  If all ok then yearly cpx    Health Maintenance, Female Adopting a healthy lifestyle and getting preventive care can go a long way to promote health and wellness. Talk with your health care provider about what schedule of regular examinations is right for you. This is a good chance for you to check in with your provider about disease prevention and staying healthy. In between checkups, there are plenty of things you can do on your own. Experts have done a lot of research about which lifestyle changes and preventive measures are most likely to keep you healthy. Ask your health care provider for more information. Weight and diet Eat a healthy diet  Be sure to include plenty of vegetables, fruits, low-fat dairy products, and lean protein.  Do not eat a lot of foods high in solid fats, added sugars, or salt.  Get regular exercise. This is one of the most important things you can do for your health. ? Most adults should exercise for at least 150 minutes each week. The exercise should increase your heart rate and make you sweat (moderate-intensity exercise). ? Most adults should also do strengthening exercises at least twice a week. This is in addition to the moderate-intensity exercise.  Maintain a healthy weight  Body mass index (BMI) is a measurement that can be used to identify possible weight problems. It estimates body fat based on height and weight. Your health care provider can help determine your BMI and help you achieve or maintain a healthy weight.  For females 44 years of age and older: ? A BMI below 18.5 is considered underweight. ? A BMI of 18.5 to 24.9 is normal. ? A BMI of 25 to 29.9 is considered overweight. ? A BMI of 30 and above is considered obese.  Watch levels of cholesterol and blood lipids  You should start having your blood tested for  lipids and cholesterol at 52 years of age, then have this test every 5 years.  You may need to have your cholesterol levels checked more often if: ? Your lipid or cholesterol levels are high. ? You are older than 52 years of age. ? You are at high risk for heart disease.  Cancer screening Lung Cancer  Lung cancer screening is recommended for adults 18-57 years old who are at high risk for lung cancer because of a history of smoking.  A yearly low-dose CT scan of the lungs is recommended for people who: ? Currently smoke. ? Have quit within the past 15 years. ? Have at least a 30-pack-year history of smoking. A pack year is smoking an average of one pack of cigarettes a day for 1 year.  Yearly screening should continue until it has been 15 years since you quit.  Yearly screening should stop if you develop a health problem that would prevent you from having lung cancer treatment.  Breast Cancer  Practice breast self-awareness. This means understanding how your breasts normally appear and feel.  It also means doing regular breast self-exams. Let your health care provider know about any changes, no matter how small.  If you are in your 20s or 30s, you should have a clinical breast exam (CBE) by a health care provider every 1-3 years as part of a regular health exam.  If you are 40  or older, have a CBE every year. Also consider having a breast X-ray (mammogram) every year.  If you have a family history of breast cancer, talk to your health care provider about genetic screening.  If you are at high risk for breast cancer, talk to your health care provider about having an MRI and a mammogram every year.  Breast cancer gene (BRCA) assessment is recommended for women who have family members with BRCA-related cancers. BRCA-related cancers include: ? Breast. ? Ovarian. ? Tubal. ? Peritoneal cancers.  Results of the assessment will determine the need for genetic counseling and BRCA1 and  BRCA2 testing.  Cervical Cancer Your health care provider may recommend that you be screened regularly for cancer of the pelvic organs (ovaries, uterus, and vagina). This screening involves a pelvic examination, including checking for microscopic changes to the surface of your cervix (Pap test). You may be encouraged to have this screening done every 3 years, beginning at age 43.  For women ages 43-65, health care providers may recommend pelvic exams and Pap testing every 3 years, or they may recommend the Pap and pelvic exam, combined with testing for human papilloma virus (HPV), every 5 years. Some types of HPV increase your risk of cervical cancer. Testing for HPV may also be done on women of any age with unclear Pap test results.  Other health care providers may not recommend any screening for nonpregnant women who are considered low risk for pelvic cancer and who do not have symptoms. Ask your health care provider if a screening pelvic exam is right for you.  If you have had past treatment for cervical cancer or a condition that could lead to cancer, you need Pap tests and screening for cancer for at least 20 years after your treatment. If Pap tests have been discontinued, your risk factors (such as having a new sexual partner) need to be reassessed to determine if screening should resume. Some women have medical problems that increase the chance of getting cervical cancer. In these cases, your health care provider may recommend more frequent screening and Pap tests.  Colorectal Cancer  This type of cancer can be detected and often prevented.  Routine colorectal cancer screening usually begins at 52 years of age and continues through 52 years of age.  Your health care provider may recommend screening at an earlier age if you have risk factors for colon cancer.  Your health care provider may also recommend using home test kits to check for hidden blood in the stool.  A small camera at the  end of a tube can be used to examine your colon directly (sigmoidoscopy or colonoscopy). This is done to check for the earliest forms of colorectal cancer.  Routine screening usually begins at age 73.  Direct examination of the colon should be repeated every 5-10 years through 52 years of age. However, you may need to be screened more often if early forms of precancerous polyps or small growths are found.  Skin Cancer  Check your skin from head to toe regularly.  Tell your health care provider about any new moles or changes in moles, especially if there is a change in a mole's shape or color.  Also tell your health care provider if you have a mole that is larger than the size of a pencil eraser.  Always use sunscreen. Apply sunscreen liberally and repeatedly throughout the day.  Protect yourself by wearing long sleeves, pants, a wide-brimmed hat, and sunglasses whenever you are  outside.  Heart disease, diabetes, and high blood pressure  High blood pressure causes heart disease and increases the risk of stroke. High blood pressure is more likely to develop in: ? People who have blood pressure in the high end of the normal range (130-139/85-89 mm Hg). ? People who are overweight or obese. ? People who are African American.  If you are 71-66 years of age, have your blood pressure checked every 3-5 years. If you are 8 years of age or older, have your blood pressure checked every year. You should have your blood pressure measured twice-once when you are at a hospital or clinic, and once when you are not at a hospital or clinic. Record the average of the two measurements. To check your blood pressure when you are not at a hospital or clinic, you can use: ? An automated blood pressure machine at a pharmacy. ? A home blood pressure monitor.  If you are between 27 years and 62 years old, ask your health care provider if you should take aspirin to prevent strokes.  Have regular diabetes  screenings. This involves taking a blood sample to check your fasting blood sugar level. ? If you are at a normal weight and have a low risk for diabetes, have this test once every three years after 52 years of age. ? If you are overweight and have a high risk for diabetes, consider being tested at a younger age or more often. Preventing infection Hepatitis B  If you have a higher risk for hepatitis B, you should be screened for this virus. You are considered at high risk for hepatitis B if: ? You were born in a country where hepatitis B is common. Ask your health care provider which countries are considered high risk. ? Your parents were born in a high-risk country, and you have not been immunized against hepatitis B (hepatitis B vaccine). ? You have HIV or AIDS. ? You use needles to inject street drugs. ? You live with someone who has hepatitis B. ? You have had sex with someone who has hepatitis B. ? You get hemodialysis treatment. ? You take certain medicines for conditions, including cancer, organ transplantation, and autoimmune conditions.  Hepatitis C  Blood testing is recommended for: ? Everyone born from [birth year] through 1966. ? Anyone with known risk factors for hepatitis C.  Sexually transmitted infections (STIs)  You should be screened for sexually transmitted infections (STIs) including gonorrhea and chlamydia if: ? You are sexually active and are younger than 52 years of age. ? You are older than 52 years of age and your health care provider tells you that you are at risk for this type of infection. ? Your sexual activity has changed since you were last screened and you are at an increased risk for chlamydia or gonorrhea. Ask your health care provider if you are at risk.  If you do not have HIV, but are at risk, it may be recommended that you take a prescription medicine daily to prevent HIV infection. This is called pre-exposure prophylaxis (PrEP). You are considered at risk  if: ? You are sexually active and do not regularly use condoms or know the HIV status of your partner(s). ? You take drugs by injection. ? You are sexually active with a partner who has HIV.  Talk with your health care provider about whether you are at high risk of being infected with HIV. If you choose to begin PrEP, you should first be tested  for HIV. You should then be tested every 3 months for as long as you are taking PrEP. Pregnancy  If you are premenopausal and you may become pregnant, ask your health care provider about preconception counseling.  If you may become pregnant, take 400 to 800 micrograms (mcg) of folic acid every day.  If you want to prevent pregnancy, talk to your health care provider about birth control (contraception). Osteoporosis and menopause  Osteoporosis is a disease in which the bones lose minerals and strength with aging. This can result in serious bone fractures. Your risk for osteoporosis can be identified using a bone density scan.  If you are 68 years of age or older, or if you are at risk for osteoporosis and fractures, ask your health care provider if you should be screened.  Ask your health care provider whether you should take a calcium or vitamin D supplement to lower your risk for osteoporosis.  Menopause may have certain physical symptoms and risks.  Hormone replacement therapy may reduce some of these symptoms and risks. Talk to your health care provider about whether hormone replacement therapy is right for you. Follow these instructions at home:  Schedule regular health, dental, and eye exams.  Stay current with your immunizations.  Do not use any tobacco products including cigarettes, chewing tobacco, or electronic cigarettes.  If you are pregnant, do not drink alcohol.  If you are breastfeeding, limit how much and how often you drink alcohol.  Limit alcohol intake to no more than 1 drink per day for nonpregnant women. One drink  equals 12 ounces of beer, 5 ounces of wine, or 1 ounces of hard liquor.  Do not use street drugs.  Do not share needles.  Ask your health care provider for help if you need support or information about quitting drugs.  Tell your health care provider if you often feel depressed.  Tell your health care provider if you have ever been abused or do not feel safe at home. This information is not intended to replace advice given to you by your health care provider. Make sure you discuss any questions you have with your health care provider. Document Released: 02/18/2011 Document Revised: 01/11/2016 Document Reviewed: 05/09/2015 Elsevier Interactive Patient Education  [employer].

## 2017-08-21 DIAGNOSIS — J3089 Other allergic rhinitis: Secondary | ICD-10-CM | POA: Diagnosis not present

## 2017-08-21 DIAGNOSIS — J301 Allergic rhinitis due to pollen: Secondary | ICD-10-CM | POA: Diagnosis not present

## 2017-08-21 DIAGNOSIS — J3081 Allergic rhinitis due to animal (cat) (dog) hair and dander: Secondary | ICD-10-CM | POA: Diagnosis not present

## 2017-08-27 DIAGNOSIS — J3081 Allergic rhinitis due to animal (cat) (dog) hair and dander: Secondary | ICD-10-CM | POA: Diagnosis not present

## 2017-08-27 DIAGNOSIS — J3089 Other allergic rhinitis: Secondary | ICD-10-CM | POA: Diagnosis not present

## 2017-08-27 DIAGNOSIS — J301 Allergic rhinitis due to pollen: Secondary | ICD-10-CM | POA: Diagnosis not present

## 2017-09-03 DIAGNOSIS — J3081 Allergic rhinitis due to animal (cat) (dog) hair and dander: Secondary | ICD-10-CM | POA: Diagnosis not present

## 2017-09-03 DIAGNOSIS — J301 Allergic rhinitis due to pollen: Secondary | ICD-10-CM | POA: Diagnosis not present

## 2017-09-03 DIAGNOSIS — J3089 Other allergic rhinitis: Secondary | ICD-10-CM | POA: Diagnosis not present

## 2017-09-10 DIAGNOSIS — J301 Allergic rhinitis due to pollen: Secondary | ICD-10-CM | POA: Diagnosis not present

## 2017-09-10 DIAGNOSIS — J3089 Other allergic rhinitis: Secondary | ICD-10-CM | POA: Diagnosis not present

## 2017-09-10 DIAGNOSIS — J3081 Allergic rhinitis due to animal (cat) (dog) hair and dander: Secondary | ICD-10-CM | POA: Diagnosis not present

## 2017-09-17 DIAGNOSIS — J3089 Other allergic rhinitis: Secondary | ICD-10-CM | POA: Diagnosis not present

## 2017-09-17 DIAGNOSIS — J301 Allergic rhinitis due to pollen: Secondary | ICD-10-CM | POA: Diagnosis not present

## 2017-09-17 DIAGNOSIS — J3081 Allergic rhinitis due to animal (cat) (dog) hair and dander: Secondary | ICD-10-CM | POA: Diagnosis not present

## 2017-09-24 DIAGNOSIS — J3081 Allergic rhinitis due to animal (cat) (dog) hair and dander: Secondary | ICD-10-CM | POA: Diagnosis not present

## 2017-09-24 DIAGNOSIS — J301 Allergic rhinitis due to pollen: Secondary | ICD-10-CM | POA: Diagnosis not present

## 2017-09-24 DIAGNOSIS — J3089 Other allergic rhinitis: Secondary | ICD-10-CM | POA: Diagnosis not present

## 2017-10-08 DIAGNOSIS — J301 Allergic rhinitis due to pollen: Secondary | ICD-10-CM | POA: Diagnosis not present

## 2017-10-08 DIAGNOSIS — J3089 Other allergic rhinitis: Secondary | ICD-10-CM | POA: Diagnosis not present

## 2017-10-08 DIAGNOSIS — J3081 Allergic rhinitis due to animal (cat) (dog) hair and dander: Secondary | ICD-10-CM | POA: Diagnosis not present

## 2017-10-17 ENCOUNTER — Other Ambulatory Visit: Payer: Self-pay | Admitting: Internal Medicine

## 2017-10-17 DIAGNOSIS — Z139 Encounter for screening, unspecified: Secondary | ICD-10-CM

## 2017-10-21 DIAGNOSIS — J3081 Allergic rhinitis due to animal (cat) (dog) hair and dander: Secondary | ICD-10-CM | POA: Diagnosis not present

## 2017-10-21 DIAGNOSIS — J301 Allergic rhinitis due to pollen: Secondary | ICD-10-CM | POA: Diagnosis not present

## 2017-10-21 DIAGNOSIS — J3089 Other allergic rhinitis: Secondary | ICD-10-CM | POA: Diagnosis not present

## 2017-10-29 DIAGNOSIS — J3081 Allergic rhinitis due to animal (cat) (dog) hair and dander: Secondary | ICD-10-CM | POA: Diagnosis not present

## 2017-10-29 DIAGNOSIS — J3089 Other allergic rhinitis: Secondary | ICD-10-CM | POA: Diagnosis not present

## 2017-10-29 DIAGNOSIS — J301 Allergic rhinitis due to pollen: Secondary | ICD-10-CM | POA: Diagnosis not present

## 2017-11-05 ENCOUNTER — Ambulatory Visit: Payer: 59 | Admitting: Family Medicine

## 2017-11-05 DIAGNOSIS — M79644 Pain in right finger(s): Secondary | ICD-10-CM | POA: Diagnosis not present

## 2017-11-05 DIAGNOSIS — M65311 Trigger thumb, right thumb: Secondary | ICD-10-CM | POA: Diagnosis not present

## 2017-11-06 ENCOUNTER — Ambulatory Visit
Admission: RE | Admit: 2017-11-06 | Discharge: 2017-11-06 | Disposition: A | Discharge status: Home / Self Care | Payer: 59 | Source: Ambulatory Visit | Attending: Internal Medicine | Admitting: Internal Medicine

## 2017-11-06 DIAGNOSIS — Z139 Encounter for screening, unspecified: Secondary | ICD-10-CM

## 2017-11-06 DIAGNOSIS — Z1231 Encounter for screening mammogram for malignant neoplasm of breast: Secondary | ICD-10-CM | POA: Diagnosis not present

## 2017-11-06 DIAGNOSIS — J3089 Other allergic rhinitis: Secondary | ICD-10-CM | POA: Diagnosis not present

## 2017-11-06 DIAGNOSIS — J3081 Allergic rhinitis due to animal (cat) (dog) hair and dander: Secondary | ICD-10-CM | POA: Diagnosis not present

## 2017-11-06 DIAGNOSIS — J301 Allergic rhinitis due to pollen: Secondary | ICD-10-CM | POA: Diagnosis not present

## 2017-11-19 DIAGNOSIS — J3081 Allergic rhinitis due to animal (cat) (dog) hair and dander: Secondary | ICD-10-CM | POA: Diagnosis not present

## 2017-11-19 DIAGNOSIS — J3089 Other allergic rhinitis: Secondary | ICD-10-CM | POA: Diagnosis not present

## 2017-11-19 DIAGNOSIS — J301 Allergic rhinitis due to pollen: Secondary | ICD-10-CM | POA: Diagnosis not present

## 2017-11-19 DIAGNOSIS — M7061 Trochanteric bursitis, right hip: Secondary | ICD-10-CM | POA: Diagnosis not present

## 2017-11-19 DIAGNOSIS — M545 Low back pain: Secondary | ICD-10-CM | POA: Diagnosis not present

## 2017-11-29 DIAGNOSIS — M25551 Pain in right hip: Secondary | ICD-10-CM | POA: Diagnosis not present

## 2017-11-29 DIAGNOSIS — M5126 Other intervertebral disc displacement, lumbar region: Secondary | ICD-10-CM | POA: Diagnosis not present

## 2017-11-29 DIAGNOSIS — M5416 Radiculopathy, lumbar region: Secondary | ICD-10-CM | POA: Diagnosis not present

## 2017-11-29 DIAGNOSIS — M545 Low back pain: Secondary | ICD-10-CM | POA: Diagnosis not present

## 2017-12-04 DIAGNOSIS — M5126 Other intervertebral disc displacement, lumbar region: Secondary | ICD-10-CM | POA: Diagnosis not present

## 2017-12-04 DIAGNOSIS — M25551 Pain in right hip: Secondary | ICD-10-CM | POA: Diagnosis not present

## 2017-12-04 DIAGNOSIS — J3081 Allergic rhinitis due to animal (cat) (dog) hair and dander: Secondary | ICD-10-CM | POA: Diagnosis not present

## 2017-12-04 DIAGNOSIS — J3089 Other allergic rhinitis: Secondary | ICD-10-CM | POA: Diagnosis not present

## 2017-12-04 DIAGNOSIS — J301 Allergic rhinitis due to pollen: Secondary | ICD-10-CM | POA: Diagnosis not present

## 2017-12-04 DIAGNOSIS — M5416 Radiculopathy, lumbar region: Secondary | ICD-10-CM | POA: Diagnosis not present

## 2017-12-04 DIAGNOSIS — M545 Low back pain: Secondary | ICD-10-CM | POA: Diagnosis not present

## 2017-12-08 DIAGNOSIS — M5126 Other intervertebral disc displacement, lumbar region: Secondary | ICD-10-CM | POA: Diagnosis not present

## 2017-12-08 DIAGNOSIS — M25551 Pain in right hip: Secondary | ICD-10-CM | POA: Diagnosis not present

## 2017-12-08 DIAGNOSIS — M545 Low back pain: Secondary | ICD-10-CM | POA: Diagnosis not present

## 2017-12-08 DIAGNOSIS — M5416 Radiculopathy, lumbar region: Secondary | ICD-10-CM | POA: Diagnosis not present

## 2017-12-15 DIAGNOSIS — M5416 Radiculopathy, lumbar region: Secondary | ICD-10-CM | POA: Diagnosis not present

## 2017-12-15 DIAGNOSIS — M25551 Pain in right hip: Secondary | ICD-10-CM | POA: Diagnosis not present

## 2017-12-15 DIAGNOSIS — M5126 Other intervertebral disc displacement, lumbar region: Secondary | ICD-10-CM | POA: Diagnosis not present

## 2017-12-15 DIAGNOSIS — M545 Low back pain: Secondary | ICD-10-CM | POA: Diagnosis not present

## 2017-12-17 DIAGNOSIS — M65311 Trigger thumb, right thumb: Secondary | ICD-10-CM | POA: Diagnosis not present

## 2017-12-18 DIAGNOSIS — M25551 Pain in right hip: Secondary | ICD-10-CM | POA: Diagnosis not present

## 2017-12-18 DIAGNOSIS — M545 Low back pain: Secondary | ICD-10-CM | POA: Diagnosis not present

## 2017-12-18 DIAGNOSIS — M5126 Other intervertebral disc displacement, lumbar region: Secondary | ICD-10-CM | POA: Diagnosis not present

## 2017-12-18 DIAGNOSIS — M5416 Radiculopathy, lumbar region: Secondary | ICD-10-CM | POA: Diagnosis not present

## 2017-12-23 DIAGNOSIS — M25551 Pain in right hip: Secondary | ICD-10-CM | POA: Diagnosis not present

## 2017-12-23 DIAGNOSIS — M5126 Other intervertebral disc displacement, lumbar region: Secondary | ICD-10-CM | POA: Diagnosis not present

## 2017-12-23 DIAGNOSIS — M5416 Radiculopathy, lumbar region: Secondary | ICD-10-CM | POA: Diagnosis not present

## 2017-12-23 DIAGNOSIS — M545 Low back pain: Secondary | ICD-10-CM | POA: Diagnosis not present

## 2017-12-25 DIAGNOSIS — M5126 Other intervertebral disc displacement, lumbar region: Secondary | ICD-10-CM | POA: Diagnosis not present

## 2017-12-25 DIAGNOSIS — M5416 Radiculopathy, lumbar region: Secondary | ICD-10-CM | POA: Diagnosis not present

## 2017-12-25 DIAGNOSIS — M25551 Pain in right hip: Secondary | ICD-10-CM | POA: Diagnosis not present

## 2017-12-25 DIAGNOSIS — M545 Low back pain: Secondary | ICD-10-CM | POA: Diagnosis not present

## 2017-12-29 DIAGNOSIS — M5416 Radiculopathy, lumbar region: Secondary | ICD-10-CM | POA: Diagnosis not present

## 2017-12-29 DIAGNOSIS — M25551 Pain in right hip: Secondary | ICD-10-CM | POA: Diagnosis not present

## 2017-12-29 DIAGNOSIS — M5126 Other intervertebral disc displacement, lumbar region: Secondary | ICD-10-CM | POA: Diagnosis not present

## 2017-12-29 DIAGNOSIS — M545 Low back pain: Secondary | ICD-10-CM | POA: Diagnosis not present

## 2018-01-01 DIAGNOSIS — M5126 Other intervertebral disc displacement, lumbar region: Secondary | ICD-10-CM | POA: Diagnosis not present

## 2018-01-01 DIAGNOSIS — M5416 Radiculopathy, lumbar region: Secondary | ICD-10-CM | POA: Diagnosis not present

## 2018-01-01 DIAGNOSIS — M545 Low back pain: Secondary | ICD-10-CM | POA: Diagnosis not present

## 2018-01-01 DIAGNOSIS — M25551 Pain in right hip: Secondary | ICD-10-CM | POA: Diagnosis not present

## 2018-01-06 ENCOUNTER — Other Ambulatory Visit (HOSPITAL_COMMUNITY): Payer: Self-pay | Admitting: Orthopedic Surgery

## 2018-01-06 ENCOUNTER — Ambulatory Visit (INDEPENDENT_AMBULATORY_CARE_PROVIDER_SITE_OTHER): Payer: 59 | Admitting: Obstetrics & Gynecology

## 2018-01-06 ENCOUNTER — Encounter: Payer: Self-pay | Admitting: Obstetrics & Gynecology

## 2018-01-06 VITALS — BP 108/76 | Ht 63.0 in | Wt 124.0 lb

## 2018-01-06 DIAGNOSIS — J3089 Other allergic rhinitis: Secondary | ICD-10-CM | POA: Diagnosis not present

## 2018-01-06 DIAGNOSIS — M25551 Pain in right hip: Secondary | ICD-10-CM | POA: Diagnosis not present

## 2018-01-06 DIAGNOSIS — N952 Postmenopausal atrophic vaginitis: Secondary | ICD-10-CM | POA: Diagnosis not present

## 2018-01-06 DIAGNOSIS — Z780 Asymptomatic menopausal state: Secondary | ICD-10-CM | POA: Diagnosis not present

## 2018-01-06 DIAGNOSIS — J3081 Allergic rhinitis due to animal (cat) (dog) hair and dander: Secondary | ICD-10-CM | POA: Diagnosis not present

## 2018-01-06 DIAGNOSIS — J301 Allergic rhinitis due to pollen: Secondary | ICD-10-CM | POA: Diagnosis not present

## 2018-01-06 DIAGNOSIS — M5410 Radiculopathy, site unspecified: Secondary | ICD-10-CM

## 2018-01-06 DIAGNOSIS — Z01419 Encounter for gynecological examination (general) (routine) without abnormal findings: Secondary | ICD-10-CM | POA: Diagnosis not present

## 2018-01-06 DIAGNOSIS — M545 Low back pain: Secondary | ICD-10-CM

## 2018-01-06 MED ORDER — ESTRADIOL 10 MCG VA TABS: 1.0000 | ORAL_TABLET | VAGINAL | 4 refills | Status: DC

## 2018-01-06 MED FILL — ESTRADIOL 10 MCG TABS: 10 | 84 days supply | Qty: 24 | Fill #0

## 2018-01-06 NOTE — Progress Notes (Signed)
Valerie [name] Dec 19, 1965 [id number]   History:    52 y.o. G0 Married  RP:  Established patient presenting for annual gyn exam   HPI: Menopause, well on no HRT.  No PMB.  No pelvic pain.  C/O dryness/pain with IC.  Urine and bowel movements normal.  Breasts normal.  Physically active.  Body mass index 21.97.  Health labs with family physician.  Past medical history,surgical history, family history and social history were all reviewed and documented in the EPIC chart.  Gynecologic History Patient's last menstrual period was 11/28/2014. Contraception: post menopausal status Last Pap: 3 yrs ago. Results were: Normal Last mammogram: 11/06/2017. Results were: Negative Bone Density: Never Colonoscopy: 2017  Obstetric History OB History  Gravida Para Term Preterm AB Living  0 0 0 0 0 0  SAB TAB Ectopic Multiple Live Births  0 0 0 0 0     ROS: A ROS was performed and pertinent positives and negatives are included in the history.  GENERAL: No fevers or chills. HEENT: No change in vision, no earache, sore throat or sinus congestion. NECK: No pain or stiffness. CARDIOVASCULAR: No chest pain or pressure. No palpitations. PULMONARY: No shortness of breath, cough or wheeze. GASTROINTESTINAL: No abdominal pain, nausea, vomiting or diarrhea, melena or bright red blood per rectum. GENITOURINARY: No urinary frequency, urgency, hesitancy or dysuria. MUSCULOSKELETAL: No joint or muscle pain, no back pain, no recent trauma. DERMATOLOGIC: No rash, no itching, no lesions. ENDOCRINE: No polyuria, polydipsia, no heat or cold intolerance. No recent change in weight. HEMATOLOGICAL: No anemia or easy bruising or bleeding. NEUROLOGIC: No headache, seizures, numbness, tingling or weakness. PSYCHIATRIC: No depression, no loss of interest in normal activity or change in sleep pattern.     Exam:   BP 108/76   Ht  (1.6 m)   Wt 124 lb (56.2 kg)   LMP 11/28/2014   BMI 21.97 kg/m   Body mass index is  21.97 kg/m.  General appearance : Well developed well nourished female. No acute distress HEENT: Eyes: no retinal hemorrhage or exudates,  Neck supple, trachea midline, no carotid bruits, no thyroidmegaly Lungs: Clear to auscultation, no rhonchi or wheezes, or rib retractions  Heart: Regular rate and rhythm, no murmurs or gallops Breast:Examined in sitting and supine position were symmetrical in appearance, no palpable masses or tenderness,  no skin retraction, no nipple inversion, no nipple discharge, no skin discoloration, no axillary or supraclavicular lymphadenopathy Abdomen: no palpable masses or tenderness, no rebound or guarding Extremities: no edema or skin discoloration or tenderness  Pelvic: Vulva: Normal             Vagina: No gross lesions or discharge  Cervix: No gross lesions or discharge.  Pap reflex done  Uterus  AV, normal size, shape and consistency, non-tender and mobile  Adnexa  Without masses or tenderness  Anus: Normal   Assessment/Plan:  52 y.o. female for annual exam   1. Encounter for routine gynecological examination with Papanicolaou smear of cervix Normal gynecologic exam.  Pap reflex done.  Breast exam normal.  Screening mammogram negative in March 2019.  Health labs with family physician.  Body mass index 21.97.  Continue with fitness and good nutrition.  2. Menopause present Well on no hormone replacement therapy.  No postmenopausal bleeding.  Vitamin D supplements, calcium rich nutrition and regular weightbearing physical activity recommended.  3. Post-menopausal atrophic vaginitis Dryness and pain with intercourse probably associated with postmenopausal atrophic vaginitis.  Will start  on estradiol vaginal tablets.  Usage and low risk reviewed.  Will start with 1 every night for 2 weeks and then will use it long-term twice a week.  Prescription sent to pharmacy.  Other orders - multivitamin-lutein (OCUVITE-LUTEIN) CAPS capsule; Take 1 capsule by mouth  daily. - Estradiol 10 MCG TABS vaginal tablet; Place 1 tablet (10 mcg total) vaginally 2 (two) times a week. Can use daily HS x first 2 weeks, then twice weekly.  Genia Del MD, 3:17 PM 01/06/2018

## 2018-01-07 DIAGNOSIS — Z01419 Encounter for gynecological examination (general) (routine) without abnormal findings: Secondary | ICD-10-CM | POA: Diagnosis not present

## 2018-01-09 ENCOUNTER — Encounter: Payer: Self-pay | Admitting: *Deleted

## 2018-01-09 LAB — PAP IG W/ RFLX HPV ASCU

## 2018-01-10 ENCOUNTER — Encounter: Payer: Self-pay | Admitting: Obstetrics & Gynecology

## 2018-01-10 ENCOUNTER — Ambulatory Visit (HOSPITAL_COMMUNITY)
Admission: RE | Admit: 2018-01-10 | Discharge: 2018-01-10 | Disposition: A | Discharge status: Home / Self Care | Payer: 59 | Source: Ambulatory Visit | Attending: Orthopedic Surgery | Admitting: Orthopedic Surgery

## 2018-01-10 DIAGNOSIS — M5410 Radiculopathy, site unspecified: Secondary | ICD-10-CM | POA: Insufficient documentation

## 2018-01-10 DIAGNOSIS — M1288 Other specific arthropathies, not elsewhere classified, other specified site: Secondary | ICD-10-CM | POA: Diagnosis not present

## 2018-01-10 DIAGNOSIS — M545 Low back pain: Secondary | ICD-10-CM

## 2018-01-10 DIAGNOSIS — M5127 Other intervertebral disc displacement, lumbosacral region: Secondary | ICD-10-CM | POA: Insufficient documentation

## 2018-01-10 DIAGNOSIS — M48061 Spinal stenosis, lumbar region without neurogenic claudication: Secondary | ICD-10-CM | POA: Diagnosis not present

## 2018-01-10 NOTE — Patient Instructions (Signed)
1. Encounter for routine gynecological examination with Papanicolaou smear of cervix Normal gynecologic exam.  Pap reflex done.  Breast exam normal.  Screening mammogram negative in March 2019.  Health labs with family physician.  Body mass index 21.97.  Continue with fitness and good nutrition.  2. Menopause present Well on no hormone replacement therapy.  No postmenopausal bleeding.  Vitamin D supplements, calcium rich nutrition and regular weightbearing physical activity recommended.  3. Post-menopausal atrophic vaginitis Dryness and pain with intercourse probably associated with postmenopausal atrophic vaginitis.  Will start on estradiol vaginal tablets.  Usage and low risk reviewed.  Will start with 1 every night for 2 weeks and then will use it long-term twice a week.  Prescription sent to pharmacy.  Other orders - multivitamin-lutein (OCUVITE-LUTEIN) CAPS capsule; Take 1 capsule by mouth daily. - Estradiol 10 MCG TABS vaginal tablet; Place 1 tablet (10 mcg total) vaginally 2 (two) times a week. Can use daily HS x first 2 weeks, then twice weekly.  Valerie [name], it was a pleasure seeing you today!  I will inform you of your results as soon as they are available.

## 2018-01-13 MED FILL — predniSONE 5 MG TABS: 5 | 12 days supply | Qty: 42 | Fill #0

## 2018-01-14 DIAGNOSIS — J3089 Other allergic rhinitis: Secondary | ICD-10-CM | POA: Diagnosis not present

## 2018-01-14 DIAGNOSIS — J301 Allergic rhinitis due to pollen: Secondary | ICD-10-CM | POA: Diagnosis not present

## 2018-01-14 DIAGNOSIS — J3081 Allergic rhinitis due to animal (cat) (dog) hair and dander: Secondary | ICD-10-CM | POA: Diagnosis not present

## 2018-01-28 DIAGNOSIS — J3089 Other allergic rhinitis: Secondary | ICD-10-CM | POA: Diagnosis not present

## 2018-01-28 DIAGNOSIS — J301 Allergic rhinitis due to pollen: Secondary | ICD-10-CM | POA: Diagnosis not present

## 2018-01-28 DIAGNOSIS — J3081 Allergic rhinitis due to animal (cat) (dog) hair and dander: Secondary | ICD-10-CM | POA: Diagnosis not present

## 2018-02-09 DIAGNOSIS — J3089 Other allergic rhinitis: Secondary | ICD-10-CM | POA: Diagnosis not present

## 2018-02-09 DIAGNOSIS — H1045 Other chronic allergic conjunctivitis: Secondary | ICD-10-CM | POA: Diagnosis not present

## 2018-02-09 DIAGNOSIS — J3081 Allergic rhinitis due to animal (cat) (dog) hair and dander: Secondary | ICD-10-CM | POA: Diagnosis not present

## 2018-02-09 DIAGNOSIS — J301 Allergic rhinitis due to pollen: Secondary | ICD-10-CM | POA: Diagnosis not present

## 2018-02-25 DIAGNOSIS — J3081 Allergic rhinitis due to animal (cat) (dog) hair and dander: Secondary | ICD-10-CM | POA: Diagnosis not present

## 2018-02-25 DIAGNOSIS — J3089 Other allergic rhinitis: Secondary | ICD-10-CM | POA: Diagnosis not present

## 2018-02-25 DIAGNOSIS — J301 Allergic rhinitis due to pollen: Secondary | ICD-10-CM | POA: Diagnosis not present

## 2018-03-11 DIAGNOSIS — J3081 Allergic rhinitis due to animal (cat) (dog) hair and dander: Secondary | ICD-10-CM | POA: Diagnosis not present

## 2018-03-11 DIAGNOSIS — H2513 Age-related nuclear cataract, bilateral: Secondary | ICD-10-CM | POA: Diagnosis not present

## 2018-03-11 DIAGNOSIS — J301 Allergic rhinitis due to pollen: Secondary | ICD-10-CM | POA: Diagnosis not present

## 2018-03-11 DIAGNOSIS — J3089 Other allergic rhinitis: Secondary | ICD-10-CM | POA: Diagnosis not present

## 2018-03-18 DIAGNOSIS — J3089 Other allergic rhinitis: Secondary | ICD-10-CM | POA: Diagnosis not present

## 2018-03-18 DIAGNOSIS — J3081 Allergic rhinitis due to animal (cat) (dog) hair and dander: Secondary | ICD-10-CM | POA: Diagnosis not present

## 2018-03-18 DIAGNOSIS — J301 Allergic rhinitis due to pollen: Secondary | ICD-10-CM | POA: Diagnosis not present

## 2018-04-07 DIAGNOSIS — J301 Allergic rhinitis due to pollen: Secondary | ICD-10-CM | POA: Diagnosis not present

## 2018-04-07 DIAGNOSIS — J3081 Allergic rhinitis due to animal (cat) (dog) hair and dander: Secondary | ICD-10-CM | POA: Diagnosis not present

## 2018-04-07 DIAGNOSIS — J3089 Other allergic rhinitis: Secondary | ICD-10-CM | POA: Diagnosis not present

## 2018-04-15 DIAGNOSIS — J301 Allergic rhinitis due to pollen: Secondary | ICD-10-CM | POA: Diagnosis not present

## 2018-04-15 DIAGNOSIS — J3081 Allergic rhinitis due to animal (cat) (dog) hair and dander: Secondary | ICD-10-CM | POA: Diagnosis not present

## 2018-04-15 DIAGNOSIS — J3089 Other allergic rhinitis: Secondary | ICD-10-CM | POA: Diagnosis not present

## 2018-04-22 DIAGNOSIS — M25531 Pain in right wrist: Secondary | ICD-10-CM | POA: Diagnosis not present

## 2018-04-22 MED FILL — predniSONE 5 MG TABS: 5 | 6 days supply | Qty: 21 | Fill #0

## 2018-04-22 MED FILL — FLUTICASONE PROP 50 MCG SPR: 50 | 30 days supply | Qty: 16 | Fill #0

## 2018-04-29 DIAGNOSIS — J3081 Allergic rhinitis due to animal (cat) (dog) hair and dander: Secondary | ICD-10-CM | POA: Diagnosis not present

## 2018-04-29 DIAGNOSIS — J3089 Other allergic rhinitis: Secondary | ICD-10-CM | POA: Diagnosis not present

## 2018-04-29 DIAGNOSIS — J301 Allergic rhinitis due to pollen: Secondary | ICD-10-CM | POA: Diagnosis not present

## 2018-05-06 DIAGNOSIS — J3089 Other allergic rhinitis: Secondary | ICD-10-CM | POA: Diagnosis not present

## 2018-05-06 DIAGNOSIS — J301 Allergic rhinitis due to pollen: Secondary | ICD-10-CM | POA: Diagnosis not present

## 2018-05-06 DIAGNOSIS — J3081 Allergic rhinitis due to animal (cat) (dog) hair and dander: Secondary | ICD-10-CM | POA: Diagnosis not present

## 2018-05-07 DIAGNOSIS — H5212 Myopia, left eye: Secondary | ICD-10-CM | POA: Diagnosis not present

## 2018-05-07 DIAGNOSIS — H524 Presbyopia: Secondary | ICD-10-CM | POA: Diagnosis not present

## 2018-05-13 DIAGNOSIS — M25531 Pain in right wrist: Secondary | ICD-10-CM | POA: Diagnosis not present

## 2018-05-13 DIAGNOSIS — M778 Other enthesopathies, not elsewhere classified: Secondary | ICD-10-CM | POA: Diagnosis not present

## 2018-05-27 DIAGNOSIS — J3089 Other allergic rhinitis: Secondary | ICD-10-CM | POA: Diagnosis not present

## 2018-05-27 DIAGNOSIS — J3081 Allergic rhinitis due to animal (cat) (dog) hair and dander: Secondary | ICD-10-CM | POA: Diagnosis not present

## 2018-05-27 DIAGNOSIS — J301 Allergic rhinitis due to pollen: Secondary | ICD-10-CM | POA: Diagnosis not present

## 2018-06-10 DIAGNOSIS — J3081 Allergic rhinitis due to animal (cat) (dog) hair and dander: Secondary | ICD-10-CM | POA: Diagnosis not present

## 2018-06-10 DIAGNOSIS — J3089 Other allergic rhinitis: Secondary | ICD-10-CM | POA: Diagnosis not present

## 2018-06-10 DIAGNOSIS — J301 Allergic rhinitis due to pollen: Secondary | ICD-10-CM | POA: Diagnosis not present

## 2018-06-10 DIAGNOSIS — M778 Other enthesopathies, not elsewhere classified: Secondary | ICD-10-CM | POA: Diagnosis not present

## 2018-06-11 DIAGNOSIS — J3089 Other allergic rhinitis: Secondary | ICD-10-CM | POA: Diagnosis not present

## 2018-06-11 DIAGNOSIS — J3081 Allergic rhinitis due to animal (cat) (dog) hair and dander: Secondary | ICD-10-CM | POA: Diagnosis not present

## 2018-07-06 DIAGNOSIS — M25571 Pain in right ankle and joints of right foot: Secondary | ICD-10-CM | POA: Diagnosis not present

## 2018-07-06 DIAGNOSIS — M25572 Pain in left ankle and joints of left foot: Secondary | ICD-10-CM | POA: Diagnosis not present

## 2018-07-22 DIAGNOSIS — J3081 Allergic rhinitis due to animal (cat) (dog) hair and dander: Secondary | ICD-10-CM | POA: Diagnosis not present

## 2018-07-22 DIAGNOSIS — M778 Other enthesopathies, not elsewhere classified: Secondary | ICD-10-CM | POA: Diagnosis not present

## 2018-07-22 DIAGNOSIS — J3089 Other allergic rhinitis: Secondary | ICD-10-CM | POA: Diagnosis not present

## 2018-07-22 DIAGNOSIS — J301 Allergic rhinitis due to pollen: Secondary | ICD-10-CM | POA: Diagnosis not present

## 2018-08-06 ENCOUNTER — Other Ambulatory Visit (HOSPITAL_COMMUNITY): Payer: Self-pay | Admitting: Orthopedic Surgery

## 2018-08-06 DIAGNOSIS — M25531 Pain in right wrist: Secondary | ICD-10-CM

## 2018-09-01 NOTE — Progress Notes (Signed)
Chief Complaint  Patient presents with  . Annual Exam    Pt is wanting a referral to therapy     HPI: Patient  Valerie [name]  53 y.o. comes in today for Preventive Health Care visit  Doing well but still battles upper back neck issues  Ms postural   Would ask for Refer  Dry needling  For thoracic spine.   Op [employer].    Saw dr Lynann Bologna    And  Dx with radiculopathy back . Stable   Health Maintenance  Topic Date Due  . HIV Screening  02/07/1980  . MAMMOGRAM  11/07/2019  . PAP SMEAR-Modifier  01/07/2021  . TETANUS/TDAP  12/04/2024  . COLONOSCOPY  11/15/2025  . INFLUENZA VACCINE  Completed   Health Maintenance Review LIFESTYLE:  Exercise:  oncde a week  Tobacco/ETS: no Alcohol:  Rare  Sugar beverages: agave  Honey  Sleep: 5-6   h  Drug use: no HH of   1 1 cats  Work: 50  +   ROS:  GEN/ HEENT: No fever, significant weight changes sweats headaches vision problems hearing changes, CV/ PULM; No chest pain shortness of breath cough, syncope,edema  change in exercise tolerance. GI /GU: No adominal pain, vomiting, change in bowel habits. No blood in the stool. No significant GU symptoms. SKIN/HEME: ,no acute skin rashes suspicious lesions or bleeding. No lymphadenopathy, nodules, masses.  NEURO/ PSYCH:  No neurologic signs such as weakness numbness. No depression anxiety. IMM/ Allergy: No unusual infections.  Allergy .   REST of 12 system review negative except as per HPI   Past Medical History:  Diagnosis Date  . Allergy    cats  . Anemia    NOS  . 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 GERD (gastroesophageal reflux disease)   . HSV infection    labialis  . RECTAL BLEEDING 07/10/2007   Qualifier: Diagnosis of  By: Regis Bill MD, Standley Brooking 2008 colonoscopy [facility].     Past Surgical History:  Procedure Laterality Date  . CYST REMOVAL HAND Right   . DILITATION & CURRETTAGE/HYSTROSCOPY WITH VERSAPOINT RESECTION  08/27/2012   Procedure: DILATATION &  CURETTAGE/HYSTEROSCOPY WITH VERSAPOINT RESECTION;  Surgeon: Princess Bruins, MD;  Location: [facility];  Service: Gynecology;;  . LASIK  2009   mono vision  . TRIGGER FINGER RELEASE Right 06/02/2017   Procedure: RELEASE TRIGGER FINGER/A-1 PULLEY RIGHT RING TRIGGER RELEASE;  Surgeon: Leanora Cover, MD;  Location: [facility];  Service: Orthopedics;  Laterality: Right;    Family History  Problem Relation Age of Onset  . Diabetes Father   . Hyperlipidemia Father   . Heart disease Father   . Stroke Father   . Colon polyps Brother        elev TG  . Alcohol abuse Other   . Breast cancer Maternal Aunt        diagnosed in her 71's  . Stroke Maternal Grandmother   . Diabetes Maternal Grandmother   . Heart disease Maternal Grandmother   . Diabetes Maternal Grandfather   . Hyperlipidemia Brother        elevated triglycerides  . Liver disease Maternal Uncle        alcohol related  . Colon cancer Neg Hx     Social History   Socioeconomic History  . Marital status: Married    Spouse name: Not on file  . Number of children: Not on file  . Years of  education: Not on file  . Highest education level: Not on file  Occupational History  . Occupation: PA    Employer: [employer]    Comment: Rehab  Social Needs  . Financial resource strain: Not on file  . Food insecurity:    Worry: Not on file    Inability: Not on file  . Transportation needs:    Medical: Not on file    Non-medical: Not on file  Tobacco Use  . Smoking status: Never Smoker  . Smokeless tobacco: Never Used  Substance and Sexual Activity  . Alcohol use: Yes    Alcohol/week: 0.0 standard drinks    Comment: ONCE A MONTH   . Drug use: No  . Sexual activity: Yes    Partners: Male    Comment: 1st intercourse- 54, partners- 67, married- 9 yrs   Lifestyle  . Physical activity:    Days per week: Not on file    Minutes per session: Not on file  . Stress: Not on file  Relationships  . Social connections:     Talks on phone: Not on file    Gets together: Not on file    Attends religious service: Not on file    Active member of club or organization: Not on file    Attends meetings of clubs or organizations: Not on file    Relationship status: Not on file  Other Topics Concern  . 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 cetirizine (ZYRTEC) 10 MG tablet Take 10 mg by mouth daily as needed for allergies.    . cholecalciferol (VITAMIN D) 1000 UNITS tablet Take 1,000 Units by mouth daily.    . diphenhydrAMINE (BENADRYL) 50 MG capsule Take 50 mg by mouth every 6 (six) hours as needed.    . Multiple Vitamin (MULTIVITAMIN WITH MINERALS) TABS Take 1 tablet by mouth daily.    . multivitamin-lutein (OCUVITE-LUTEIN) CAPS capsule Take 1 capsule by mouth daily.    . Omega-3 Fatty Acids (FISH OIL) 1200 MG CAPS Take 1,200 mg by mouth daily.    . TURMERIC PO Take by mouth.    . valACYclovir (VALTREX) 1000 MG tablet Take 2 TABLETS BY MOUTH AND REPEATIN 12 HOURS OR AS DIRECTED 30 tablet 4  . Estradiol 10 MCG TABS vaginal tablet Place 1 tablet (10 mcg total) vaginally 2 (two) times a week. Can use daily HS x first 2 weeks, then twice weekly. 24 tablet 4   No facility-administered medications prior to visit.      EXAM:  BP 122/68 (BP Location: Right Arm, Patient Position: Sitting, Cuff Size: Normal)   Pulse 85   Temp 98.4 F (36.9 C) (Oral)   Ht 5' 3.75" (1.619 m)   Wt 126 lb 6.4 oz (57.3 kg)   LMP 11/28/2014   BMI 21.87 kg/m   Body mass index is 21.87 kg/m. Wt Readings from Last 3 Encounters:  09/02/18 126 lb 6.4 oz (57.3 kg)  01/06/18 124 lb (56.2 kg)  08/13/17 128 lb 6.4 oz (58.2 kg)    Physical Exam: Vital signs reviewed [id number]:XNAT is a well-developed well-nourished  alert cooperative    who appearsr stated age in no acute distress.  HEENT: normocephalic atraumatic , Eyes: PERRL EOM's full, conjunctiva clear, Nares: paten,t no deformity discharge or tenderness., Ears: no deformity EAC's clear TMs with normal landmarks. Mouth: clear OP, no lesions, edema.  Moist mucous membranes. Dentition in adequate repair. NECK: supple without masses, thyromegaly or bruits. CHEST/PULM:  Clear to auscultation and percussion breath sounds equal no wheeze , rales or rhonchi. No chest wall deformities or tenderness. Breast: normal by inspection . No dimpling, discharge, masses, tenderness or discharge . CV: PMI is nondisplaced, S1 S2 no gallops, murmurs, rubs. Peripheral pulses are full without delay.No JVD .  ABDOMEN: Bowel sounds normal nontender  No guard or rebound, no hepato splenomegal no CVA tenderness.  No hernia. Extremtities:  No clubbing cyanosis or edema, no acute joint swelling or redness no focal atrophy NEURO:  Oriented x3, cranial nerves 3-12 appear to be intact, no obvious focal weakness,gait within normal limits no abnormal reflexes or asymmetrical SKIN: No acute rashes normal turgor, color, no bruising or petechiae. PSYCH: Oriented, good eye contact, no obvious depression anxiety, cognition and judgment appear normal. LN: no cervical axillary inguinal adenopathy  Lab Results  Component Value Date   WBC 3.9 (L) 08/13/2017   HGB 12.6 08/13/2017   HCT 38.0 08/13/2017   PLT 198.0 08/13/2017   GLUCOSE 86 08/13/2017   CHOL 172 08/13/2017   TRIG 61.0 08/13/2017   HDL 61.90 08/13/2017   LDLCALC 98 08/13/2017   ALT 15 08/13/2017   AST 18 08/13/2017   NA 139 08/13/2017   K 3.8 08/13/2017   CL 104 08/13/2017   CREATININE 0.69 08/13/2017   BUN 12 08/13/2017   CO2 29 08/13/2017   TSH 1.03 08/13/2017    BP Readings from Last 3 Encounters:  09/02/18 122/68  01/06/18 108/76  08/13/17 110/70    ASSESSMENT AND PLAN:  Discussed the following assessment  and plan:  Visit for preventive health examination - Plan: Basic metabolic panel, CBC with Differential/Platelet, Hepatic function panel, Lipid panel, TSH, Hemoglobin A1c  Upper back pain - Plan: Basic metabolic panel, CBC with Differential/Platelet, Hepatic function panel, Lipid panel, TSH, Ambulatory referral to Physical Therapy  Screening, lipid - Plan: Basic metabolic panel, CBC with Differential/Platelet, Hepatic function panel, Lipid panel, TSH   Expectant management.  Lab monitoring  Patient Care Team: Cadey Bazile, Standley Brooking, MD as PCP - General Mosetta Anis, MD (Allergy) Princess Bruins, MD as Attending Physician (Obstetrics and Gynecology) Lyndal Pulley, DO as Attending Physician (Sports Medicine) Patient Instructions  Will do referral for  Dry needling   Will notify you  of labs when available.   Preventive Care 40-64 Years, Female Preventive care refers to lifestyle choices and visits with your health care provider that can promote health and wellness. What does preventive care include?   A yearly physical exam. This is also called an annual well check.  Dental exams once or twice a year.  Routine eye exams. Ask your health care provider how often you should have your eyes checked.  Personal lifestyle choices, including: ? Daily care of your teeth and gums. ? Regular physical activity. ? Eating a healthy diet. ? Avoiding tobacco and drug use. ? Limiting alcohol use. ? Practicing safe sex. ? Taking low-dose aspirin daily starting at age 14. ? Taking vitamin and mineral supplements as recommended by your health care provider. What happens during an annual well check? The services and screenings done by your health care provider during your annual well check will depend on your age, overall health,  lifestyle risk factors, and family history of disease. Counseling Your health care provider may ask you questions about your:  Alcohol use.  Tobacco use.  Drug  use.  Emotional well-being.  Home and relationship well-being.  Sexual activity.  Eating habits.  Work and work Statistician.  Method of birth control.  Menstrual cycle.  Pregnancy history. Screening You may have the following tests or measurements:  Height, weight, and BMI.  Blood pressure.  Lipid and cholesterol levels. These may be checked every 5 years, or more frequently if you are over 10 years old.  Skin check.  Lung cancer screening. You may have this screening every year starting at age 14 if you have a 30-pack-year history of smoking and currently smoke or have quit within the past 15 years.  Colorectal cancer screening. All adults should have this screening starting at age 6 and continuing until age 46. Your health care provider may recommend screening at age 43. You will have tests every 1-10 years, depending on your results and the type of screening test. People at increased risk should start screening at an earlier age. Screening tests may include: ? Guaiac-based fecal occult blood testing. ? Fecal immunochemical test (FIT). ? Stool DNA test. ? Virtual colonoscopy. ? Sigmoidoscopy. During this test, a flexible tube with a tiny camera (sigmoidoscope) is used to examine your rectum and lower colon. The sigmoidoscope is inserted through your anus into your rectum and lower colon. ? Colonoscopy. During this test, a long, thin, flexible tube with a tiny camera (colonoscope) is used to examine your entire colon and rectum.  Hepatitis C blood test.  Hepatitis B blood test.  Sexually transmitted disease (STD) testing.  Diabetes screening. This is done by checking your blood sugar (glucose) after you have not eaten for a while (fasting). You may have this done every 1-3 years.  Mammogram. This may be done every 1-2 years. Talk to your health care provider about when you should start having regular mammograms. This may depend on whether you have a family history of  breast cancer.  BRCA-related cancer screening. This may be done if you have a family history of breast, ovarian, tubal, or peritoneal cancers.  Pelvic exam and Pap test. This may be done every 3 years starting at age 49. Starting at age 16, this may be done every 5 years if you have a Pap test in combination with an HPV test.  Bone density scan. This is done to screen for osteoporosis. You may have this scan if you are at high risk for osteoporosis. Discuss your test results, treatment options, and if necessary, the need for more tests with your health care provider. Vaccines Your health care provider may recommend certain vaccines, such as:  Influenza vaccine. This is recommended every year.  Tetanus, diphtheria, and acellular pertussis (Tdap, Td) vaccine. You may need a Td booster every 10 years.  Varicella vaccine. You may need this if you have not been vaccinated.  Zoster vaccine. You may need this after age 37.  Measles, mumps, and rubella (MMR) vaccine. You may need at least one dose of MMR if you were born in 1957 or later. You may also need a second dose.  Pneumococcal 13-valent conjugate (PCV13) vaccine. You may need this if you have certain conditions and were not previously vaccinated.  Pneumococcal polysaccharide (PPSV23) vaccine. You may need one or two doses if you smoke cigarettes or if you have certain conditions.  Meningococcal vaccine. You may need this if  you have certain conditions.  Hepatitis A vaccine. You may need this if you have certain conditions or if you travel or work in places where you may be exposed to hepatitis A.  Hepatitis B vaccine. You may need this if you have certain conditions or if you travel or work in places where you may be exposed to hepatitis B.  Haemophilus influenzae type b (Hib) vaccine. You may need this if you have certain conditions. Talk to your health care provider about which screenings and vaccines you need and how often you need  them. This information is not intended to replace advice given to you by your health care provider. Make sure you discuss any questions you have with your health care provider. Document Released: 09/01/2015 Document Revised: 09/25/2017 Document Reviewed: 06/06/2015 Elsevier Interactive Patient Education  2019 [place] K. Leandra Vanderweele M.D.

## 2018-09-02 ENCOUNTER — Ambulatory Visit (HOSPITAL_COMMUNITY)
Admission: RE | Admit: 2018-09-02 | Discharge: 2018-09-02 | Disposition: A | Discharge status: Home / Self Care | Payer: 59 | Source: Ambulatory Visit | Attending: Orthopedic Surgery | Admitting: Orthopedic Surgery

## 2018-09-02 ENCOUNTER — Encounter: Payer: Self-pay | Admitting: Internal Medicine

## 2018-09-02 ENCOUNTER — Ambulatory Visit (INDEPENDENT_AMBULATORY_CARE_PROVIDER_SITE_OTHER): Payer: 59 | Admitting: Internal Medicine

## 2018-09-02 VITALS — BP 122/68 | HR 85 | Temp 98.4°F | Ht 63.75 in | Wt 126.4 lb

## 2018-09-02 DIAGNOSIS — Z0000 Encounter for general adult medical examination without abnormal findings: Secondary | ICD-10-CM

## 2018-09-02 DIAGNOSIS — M25531 Pain in right wrist: Secondary | ICD-10-CM | POA: Insufficient documentation

## 2018-09-02 DIAGNOSIS — M778 Other enthesopathies, not elsewhere classified: Secondary | ICD-10-CM

## 2018-09-02 DIAGNOSIS — M549 Dorsalgia, unspecified: Secondary | ICD-10-CM | POA: Diagnosis not present

## 2018-09-02 DIAGNOSIS — S63511A Sprain of carpal joint of right wrist, initial encounter: Secondary | ICD-10-CM | POA: Diagnosis not present

## 2018-09-02 DIAGNOSIS — Z13220 Encounter for screening for lipoid disorders: Secondary | ICD-10-CM | POA: Diagnosis not present

## 2018-09-02 DIAGNOSIS — S6981XD Other specified injuries of right wrist, hand and finger(s), subsequent encounter: Secondary | ICD-10-CM | POA: Insufficient documentation

## 2018-09-02 MED ORDER — SODIUM CHLORIDE (PF) 0.9 % IJ SOLN
10.0000 mL | Freq: Once | INTRAMUSCULAR | Status: AC
  Administered 2018-09-02: 1 mL via INTRAVENOUS

## 2018-09-02 MED ORDER — LIDOCAINE HCL (PF) 1 % IJ SOLN
5.0000 mL | Freq: Once | INTRAMUSCULAR | Status: AC
  Administered 2018-09-02: 2 mL via INTRADERMAL

## 2018-09-02 MED ORDER — GADOBENATE DIMEGLUMINE 529 MG/ML IV SOLN
0.1000 mL | Freq: Once | INTRAVENOUS | Status: AC | PRN
  Administered 2018-09-02: 0.1 mL via INTRA_ARTICULAR

## 2018-09-02 MED ORDER — IOPAMIDOL (ISOVUE-M 200) INJECTION 41%
10.0000 mL | Freq: Once | INTRAMUSCULAR | Status: AC
  Administered 2018-09-02: 1 mL via INTRA_ARTICULAR

## 2018-09-02 NOTE — Patient Instructions (Signed)
Will do referral for  Dry needling   Will notify you  of labs when available.   Preventive Care 40-64 Years, Female Preventive care refers to lifestyle choices and visits with your health care provider that can promote health and wellness. What does preventive care include?   A yearly physical exam. This is also called an annual well check.  Dental exams once or twice a year.  Routine eye exams. Ask your health care provider how often you should have your eyes checked.  Personal lifestyle choices, including: ? Daily care of your teeth and gums. ? Regular physical activity. ? Eating a healthy diet. ? Avoiding tobacco and drug use. ? Limiting alcohol use. ? Practicing safe sex. ? Taking low-dose aspirin daily starting at age 20. ? Taking vitamin and mineral supplements as recommended by your health care provider. What happens during an annual well check? The services and screenings done by your health care provider during your annual well check will depend on your age, overall health, lifestyle risk factors, and family history of disease. Counseling Your health care provider may ask you questions about your:  Alcohol use.  Tobacco use.  Drug use.  Emotional well-being.  Home and relationship well-being.  Sexual activity.  Eating habits.  Work and work Statistician.  Method of birth control.  Menstrual cycle.  Pregnancy history. Screening You may have the following tests or measurements:  Height, weight, and BMI.  Blood pressure.  Lipid and cholesterol levels. These may be checked every 5 years, or more frequently if you are over 32 years old.  Skin check.  Lung cancer screening. You may have this screening every year starting at age 48 if you have a 30-pack-year history of smoking and currently smoke or have quit within the past 15 years.  Colorectal cancer screening. All adults should have this screening starting at age 60 and continuing until age 45. Your  health care provider may recommend screening at age 79. You will have tests every 1-10 years, depending on your results and the type of screening test. People at increased risk should start screening at an earlier age. Screening tests may include: ? Guaiac-based fecal occult blood testing. ? Fecal immunochemical test (FIT). ? Stool DNA test. ? Virtual colonoscopy. ? Sigmoidoscopy. During this test, a flexible tube with a tiny camera (sigmoidoscope) is used to examine your rectum and lower colon. The sigmoidoscope is inserted through your anus into your rectum and lower colon. ? Colonoscopy. During this test, a long, thin, flexible tube with a tiny camera (colonoscope) is used to examine your entire colon and rectum.  Hepatitis C blood test.  Hepatitis B blood test.  Sexually transmitted disease (STD) testing.  Diabetes screening. This is done by checking your blood sugar (glucose) after you have not eaten for a while (fasting). You may have this done every 1-3 years.  Mammogram. This may be done every 1-2 years. Talk to your health care provider about when you should start having regular mammograms. This may depend on whether you have a family history of breast cancer.  BRCA-related cancer screening. This may be done if you have a family history of breast, ovarian, tubal, or peritoneal cancers.  Pelvic exam and Pap test. This may be done every 3 years starting at age 17. Starting at age 81, this may be done every 5 years if you have a Pap test in combination with an HPV test.  Bone density scan. This is done to screen for osteoporosis.  You may have this scan if you are at high risk for osteoporosis. Discuss your test results, treatment options, and if necessary, the need for more tests with your health care provider. Vaccines Your health care provider may recommend certain vaccines, such as:  Influenza vaccine. This is recommended every year.  Tetanus, diphtheria, and acellular pertussis  (Tdap, Td) vaccine. You may need a Td booster every 10 years.  Varicella vaccine. You may need this if you have not been vaccinated.  Zoster vaccine. You may need this after age 61.  Measles, mumps, and rubella (MMR) vaccine. You may need at least one dose of MMR if you were born in 1957 or later. You may also need a second dose.  Pneumococcal 13-valent conjugate (PCV13) vaccine. You may need this if you have certain conditions and were not previously vaccinated.  Pneumococcal polysaccharide (PPSV23) vaccine. You may need one or two doses if you smoke cigarettes or if you have certain conditions.  Meningococcal vaccine. You may need this if you have certain conditions.  Hepatitis A vaccine. You may need this if you have certain conditions or if you travel or work in places where you may be exposed to hepatitis A.  Hepatitis B vaccine. You may need this if you have certain conditions or if you travel or work in places where you may be exposed to hepatitis B.  Haemophilus influenzae type b (Hib) vaccine. You may need this if you have certain conditions. Talk to your health care provider about which screenings and vaccines you need and how often you need them. This information is not intended to replace advice given to you by your health care provider. Make sure you discuss any questions you have with your health care provider. Document Released: 09/01/2015 Document Revised: 09/25/2017 Document Reviewed: 06/06/2015 Elsevier Interactive Patient Education  2019 [employer].

## 2018-09-03 LAB — CBC WITH DIFFERENTIAL/PLATELET
BASOS ABS: 0.1 10*3/uL (ref 0.0–0.1)
Basophils Relative: 1.3 % (ref 0.0–3.0)
EOS ABS: 0 10*3/uL (ref 0.0–0.7)
Eosinophils Relative: 0.9 % (ref 0.0–5.0)
HCT: 39.5 % (ref 36.0–46.0)
Hemoglobin: 13.3 g/dL (ref 12.0–15.0)
Lymphocytes Relative: 43 % (ref 12.0–46.0)
Lymphs Abs: 1.9 10*3/uL (ref 0.7–4.0)
MCHC: 33.7 g/dL (ref 30.0–36.0)
MCV: 88.5 fl (ref 78.0–100.0)
Monocytes Absolute: 0.2 10*3/uL (ref 0.1–1.0)
Monocytes Relative: 4.9 % (ref 3.0–12.0)
NEUTROS ABS: 2.2 10*3/uL (ref 1.4–7.7)
Neutrophils Relative %: 49.9 % (ref 43.0–77.0)
Platelets: 206 10*3/uL (ref 150.0–400.0)
RBC: 4.47 Mil/uL (ref 3.87–5.11)
RDW: 12.4 % (ref 11.5–15.5)
WBC: 4.4 10*3/uL (ref 4.0–10.5)

## 2018-09-03 LAB — LIPID PANEL
CHOL/HDL RATIO: 3
Cholesterol: 208 mg/dL — ABNORMAL HIGH (ref 0–200)
HDL: 76.6 mg/dL (ref >39.00)
LDL Cholesterol: 118 mg/dL — ABNORMAL HIGH (ref 0–99)
NonHDL: 131.42
Triglycerides: 67 mg/dL (ref 0.0–149.0)
VLDL: 13.4 mg/dL (ref 0.0–40.0)

## 2018-09-03 LAB — HEPATIC FUNCTION PANEL
ALT: 14 U/L (ref 0–35)
AST: 17 U/L (ref 0–37)
Albumin: 4.3 g/dL (ref 3.5–5.2)
Alkaline Phosphatase: 74 U/L (ref 39–117)
BILIRUBIN DIRECT: 0.1 mg/dL (ref 0.0–0.3)
Total Bilirubin: 0.3 mg/dL (ref 0.2–1.2)
Total Protein: 6.8 g/dL (ref 6.0–8.3)

## 2018-09-03 LAB — BASIC METABOLIC PANEL
BUN: 16 mg/dL (ref 6–23)
CO2: 28 mEq/L (ref 19–32)
Calcium: 9.5 mg/dL (ref 8.4–10.5)
Chloride: 102 mEq/L (ref 96–112)
Creatinine, Ser: 0.76 mg/dL (ref 0.40–1.20)
GFR: 84.43 mL/min (ref >60.00)
Glucose, Bld: 84 mg/dL (ref 70–99)
POTASSIUM: 4.1 meq/L (ref 3.5–5.1)
Sodium: 138 mEq/L (ref 135–145)

## 2018-09-03 LAB — TSH: TSH: 1.43 u[IU]/mL (ref 0.35–4.50)

## 2018-09-16 DIAGNOSIS — M778 Other enthesopathies, not elsewhere classified: Secondary | ICD-10-CM | POA: Diagnosis not present

## 2018-09-16 DIAGNOSIS — M65332 Trigger finger, left middle finger: Secondary | ICD-10-CM | POA: Diagnosis not present

## 2018-09-17 ENCOUNTER — Encounter: Payer: Self-pay | Admitting: Physical Therapy

## 2018-09-17 ENCOUNTER — Ambulatory Visit: Payer: 59 | Attending: Internal Medicine | Admitting: Physical Therapy

## 2018-09-17 DIAGNOSIS — R293 Abnormal posture: Secondary | ICD-10-CM | POA: Diagnosis not present

## 2018-09-17 DIAGNOSIS — M546 Pain in thoracic spine: Secondary | ICD-10-CM | POA: Diagnosis not present

## 2018-09-17 DIAGNOSIS — M62838 Other muscle spasm: Secondary | ICD-10-CM | POA: Diagnosis not present

## 2018-09-18 NOTE — Therapy (Signed)
[facility] [address] [place], [place], [address] Phone: [phone]   Fax:  [phone]  Physical Therapy Evaluation  Patient Details  Name: Valerie [name] MRN: [id number] Date of Birth: 09-25-1965 Referring Provider (PT): Berniece Andreas MD    Encounter Date: 09/17/2018  PT End of Session - 09/17/18 1611    Visit Number  1    Number of Visits  12    Date for PT Re-Evaluation  10/29/18    Authorization Type  [facility] [employer]     PT Start Time  1550    PT Stop Time  1630    PT Time Calculation (min)  40 min    Activity Tolerance  Patient tolerated treatment well    Behavior During Therapy  [facility] for tasks assessed/performed       Past Medical History:  Diagnosis Date  . Allergy    cats  . Anemia    NOS  . 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 GERD (gastroesophageal reflux disease)   . HSV infection    labialis  . RECTAL BLEEDING 07/10/2007   Qualifier: Diagnosis of  By: Fabian Sharp MD, Neta Mends 2008 colonoscopy [facility].     Past Surgical History:  Procedure Laterality Date  . CYST REMOVAL HAND Right   . DILITATION & CURRETTAGE/HYSTROSCOPY WITH VERSAPOINT RESECTION  08/27/2012   Procedure: DILATATION & CURETTAGE/HYSTEROSCOPY WITH VERSAPOINT RESECTION;  Surgeon: Genia Del, MD;  Location: [facility];  Service: Gynecology;;  . LASIK  2009   mono vision  . TRIGGER FINGER RELEASE Right 06/02/2017   Procedure: RELEASE TRIGGER FINGER/A-1 PULLEY RIGHT RING TRIGGER RELEASE;  Surgeon: Betha Loa, MD;  Location: [facility];  Service: Orthopedics;  Laterality: Right;    There were no vitals filed for this visit.   Subjective Assessment - 09/17/18 1556    Subjective  Patient has been having neck and thoracic pain for some time. She has had increased mid and upper throacic pain over the past few months. Shehas had sucsess with trigger point dry needlingin the past. The pain can get worse with cold air and when she  is busy at work.     Pertinent History  cervical spine pain; Low back pain     Limitations  Other (comment)   work activity; being at the computer    How long can you sit comfortably?  No limit    How long can you stand comfortably?  No limit     How long can you walk comfortably?  No limit     Patient Stated Goals  Less pain, start exercising again     Currently in Pain?  Yes   can get upt to a 4-5 /10 pain    Pain Score  3     Pain Location  Neck    Pain Orientation  Left;Right   more on the left    Pain Descriptors / Indicators  Aching;Sore    Pain Type  Chronic pain    Pain Onset  More than a month ago    Pain Frequency  Constant    Aggravating Factors   sitting at work for long periods of time     Pain Relieving Factors  rest     Effect of Pain on Daily Activities  Pain at work          [facility] PT Assessment - 09/18/18 0001      Assessment   Medical Diagnosis  Low Cervical/ High Thoracic/ Paeriscpualr pain    Referring Provider (PT)  Berniece Andreas MD     Onset Date/Surgical Date  --   has had pain for several years    Hand Dominance  Right    Next MD Visit  nothing scheduled at this time     Prior Therapy  Had therapy for her neck specifficaly 2 years ago. Had a reduction in pain       Precautions   Precautions  None      Restrictions   Weight Bearing Restrictions  No      Balance Screen   Has the patient fallen in the past 6 months  No    Has the patient had a decrease in activity level because of a fear of falling?   No    Is the patient reluctant to leave their home because of a fear of falling?   No      Prior Function   Level of Independence  Independent    Vocation  Full time employment    Vocation Requirements  Patient is a PA at [employer]    Leisure  goes to the gym but hasnt for the last 2 months      Cognition   Overall Cognitive Status  Within Functional Limits for tasks assessed    Attention  Focused    Focused Attention  Appears intact     Memory  Appears intact    Awareness  Appears intact    Problem Solving  Appears intact    Executive Function  Reasoning      Observation/Other Assessments   Focus on Therapeutic Outcomes (FOTO)   23% limitation       Sensation   Light Touch  Appears Intact    Additional Comments  At times patient can have some radiating pain down her biceps. She can also have some pain into her chest.       Coordination   Gross Motor Movements are Fluid and Coordinated  Yes    Fine Motor Movements are Fluid and Coordinated  Yes      Posture/Postural Control   Posture Comments  mild forward head      AROM   Overall AROM Comments  Tight feeling with end range left shoulder flexion; normal cervical motion       Strength   Right Shoulder Flexion  4+/5    Left Shoulder Flexion  4+/5      Palpation   Palpation comment  Spasming in the upper traps, into the rhombids and thoracic periscapular muscles      Special Tests   Other special tests  tender to palpation in the cervical spine and upper traps                 Objective measurements completed on examination: See above findings.      [facility] Adult PT Treatment/Exercise - 09/18/18 0001      Neck Exercises: Standing   Other Standing Exercises  scpa retraction yellow x10; shoulder extension yellow x10       Neck Exercises: Stretches   Chest Stretch Limitations  2x20 sec hold max cuing     Other Neck Stretches  rhomboid stretch 2x20 sec hold s    Other Neck Stretches  ink stretch              PT Education - 09/17/18 1610    Education Details  talked to patient about work space set up;  Person(s) Educated  Patient    Methods  Explanation;Demonstration;Tactile cues;Verbal cues    Comprehension  Verbalized understanding;Returned demonstration;Verbal cues required;Tactile cues required;Need further instruction       PT Short Term Goals - 09/18/18 1228      PT SHORT TERM GOAL #1   Title  pt will be I with inital HEP      Time  3    Period  Weeks    Status  New    Target Date  10/09/18      PT SHORT TERM GOAL #2   Title  Patient will report decreased tenderness to palpation     Time  3    Period  Weeks    Status  New      PT SHORT TERM GOAL #3   Title  Patient will demosntrate full left shoulder motion     Time  3    Period  Weeks    Status  New    Target Date  10/09/18        PT Long Term Goals - 09/18/18 1229      PT LONG TERM GOAL #1   Title  Patient will sit at work for an entire day without self report of increased pain     Time  8    Period  Weeks    Status  New    Target Date  11/13/18      PT LONG TERM GOAL #2   Title  Patient will demosntrate a 19% limitation on FOTO     Time  6    Period  Weeks    Status  New    Target Date  10/30/18      PT LONG TERM GOAL #3   Title  Patient will return to the gym without increased pain     Time  6    Period  Weeks    Status  New    Target Date  10/30/18             Plan - 09/17/18 1611    Clinical Impression Statement  Patient is a 53 year old female with upper thoracic/ periscapular pain and also spasming of her upper traps. She is occasianlly having tightness in her left pec at this time. She feels like the pain increases as the day goes on. She has increased pain and tightness when she raises her left arm. She would benefit from skilled therapy for trigger point dry needling; manual therapy,     Clinical Presentation  Evolving    Clinical Decision Making  Moderate    PT Frequency  2x / week    PT Duration  6 weeks    PT Treatment/Interventions  ADLs/Self Care Home Management;Iontophoresis 4mg /ml Dexamethasone;Cryotherapy;Moist Heat;Therapeutic activities;Therapeutic exercise;Neuromuscular re-education;Cognitive remediation;Patient/family education;Manual techniques;Manual lymph drainage    PT Next Visit Plan  TPDN to various trigger points; postural correction exercises; Soft tissue mobilization to the upper traps and  periscapular area    PT Home Exercise Plan  rhomiboid stretch; sink stretch; scap retraction; shoulder extension     Consulted and Agree with Plan of Care  Patient       Patient will benefit from skilled therapeutic intervention in order to improve the following deficits and impairments:  Pain, Postural dysfunction, Decreased activity tolerance, Decreased endurance, Decreased range of motion, Decreased strength  Visit Diagnosis: Pain in thoracic spine  Other muscle spasm  Abnormal posture     Problem List Patient Active Problem  List   Diagnosis Date Noted  . Trigger point of left shoulder region 09/21/2015  . Impingement syndrome of right shoulder 03/08/2015  . Trigger point of right shoulder region 03/08/2015  . Low back pain 03/16/2014  . Wrist pain, right 02/09/2014  . Nonallopathic lesion of cervical region 11/03/2013  . Nonallopathic lesion of thoracic region 11/03/2013  . Nonallopathic lesion-rib cage 11/03/2013  . Nonallopathic lesion of lumbosacral region 11/03/2013  . Nonallopathic lesion of sacral region 11/03/2013  . Imbalance 10/27/2012  . Neck pain 10/27/2012  . Headache(784.0) 07/15/2012  . Abdominal cramps 02/25/2012  . Abnormal stools 02/25/2012  . Delayed menses 02/25/2012  . Visit for preventive health examination 12/03/2011  . Abdominal bloating 12/03/2011  . DYSESTHESIA 09/20/2009  . ARREST OF BONE DEVELOPMENT OR GROWTH 07/24/2007  . DERMATOPHYTOSIS OF NAIL 07/10/2007  . ALLERGIC RHINITIS 07/10/2007  . GERD 07/10/2007  . BLOOD IN STOOL 07/10/2007  . HERPES LABIALIS, HX OF 07/10/2007    Dessie Coma PT DPT  09/18/2018, 12:35 PM  [facility] [address] [place], [place], [address] Phone: [phone]   Fax:  [phone]  Name: MARICEL SIRON MRN: [id number] Date of Birth: 09-03-1965

## 2018-09-22 DIAGNOSIS — J301 Allergic rhinitis due to pollen: Secondary | ICD-10-CM | POA: Diagnosis not present

## 2018-09-22 DIAGNOSIS — J3081 Allergic rhinitis due to animal (cat) (dog) hair and dander: Secondary | ICD-10-CM | POA: Diagnosis not present

## 2018-09-22 DIAGNOSIS — J3089 Other allergic rhinitis: Secondary | ICD-10-CM | POA: Diagnosis not present

## 2018-10-01 ENCOUNTER — Encounter: Payer: Self-pay | Admitting: Physical Therapy

## 2018-10-01 ENCOUNTER — Ambulatory Visit: Payer: 59 | Attending: Internal Medicine | Admitting: Physical Therapy

## 2018-10-01 DIAGNOSIS — M546 Pain in thoracic spine: Secondary | ICD-10-CM | POA: Diagnosis not present

## 2018-10-01 DIAGNOSIS — R293 Abnormal posture: Secondary | ICD-10-CM | POA: Insufficient documentation

## 2018-10-01 DIAGNOSIS — M62838 Other muscle spasm: Secondary | ICD-10-CM | POA: Insufficient documentation

## 2018-10-02 ENCOUNTER — Encounter: Payer: Self-pay | Admitting: Physical Therapy

## 2018-10-02 NOTE — Therapy (Signed)
[facility]patient [facility] [address] [place], [place], [address] Phone: [phone]   Fax:  [phone]  Physical Therapy Treatment  Patient Details  Name: NALIA STEFKO MRN: [id number] Date of Birth: Jan 04, 1966 Referring Provider (PT): Berniece Andreas MD    Encounter Date: 10/01/2018  PT End of Session - 10/02/18 0637    Visit Number  2    Number of Visits  12    Date for PT Re-Evaluation  10/29/18    Authorization Type  [facility]     PT Start Time  1545    PT Stop Time  1637    PT Time Calculation (min)  52 min    Activity Tolerance  Patient tolerated treatment well    Behavior During Therapy  [facility] for tasks assessed/performed       Past Medical History:  Diagnosis Date  . Allergy    cats  . Anemia    NOS  . 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 GERD (gastroesophageal reflux disease)   . HSV infection    labialis  . RECTAL BLEEDING 07/10/2007   Qualifier: Diagnosis of  By: Fabian Sharp MD, Neta Mends 2008 colonoscopy [facility].     Past Surgical History:  Procedure Laterality Date  . CYST REMOVAL HAND Right   . DILITATION & CURRETTAGE/HYSTROSCOPY WITH VERSAPOINT RESECTION  08/27/2012   Procedure: DILATATION & CURETTAGE/HYSTEROSCOPY WITH VERSAPOINT RESECTION;  Surgeon: Genia Del, MD;  Location: [facility];  Service: Gynecology;;  . LASIK  2009   mono vision  . TRIGGER FINGER RELEASE Right 06/02/2017   Procedure: RELEASE TRIGGER FINGER/A-1 PULLEY RIGHT RING TRIGGER RELEASE;  Surgeon: Betha Loa, MD;  Location: [facility];  Service: Orthopedics;  Laterality: Right;    There were no vitals filed for this visit.  Subjective Assessment - 10/01/18 2154    Subjective  Patient reports that the pain in her pec has reslved with stretching. She feels tension acrossed her upper traps but it is not as bad.     Pertinent History  cervical spine pain; Low back pain     Limitations  Other (comment)    How long can you sit  comfortably?  No limit    How long can you stand comfortably?  No limit     How long can you walk comfortably?  No limit     Patient Stated Goals  Less pain, start exercising again     Currently in Pain?  Yes    Pain Score  2     Pain Location  Thoracic    Pain Orientation  Right;Left    Pain Descriptors / Indicators  Aching    Pain Type  Chronic pain    Pain Onset  More than a month ago    Pain Frequency  Constant    Aggravating Factors   sitting at work     Pain Relieving Factors  rest     Effect of Pain on Daily Activities  pain at work                        [facility] Adult PT Treatment/Exercise - 10/02/18 0001      Neck Exercises: Standing   Other Standing Exercises  scap retraction yellow 2x10; shoulder extension yellow 2x10       Neck Exercises: Seated   Other Seated Exercise  seated thoracic extension x10 with cuing for postinin of the back of the  chair      Shoulder Exercises: Supine   Other Supine Exercises  bilateral ER red 2x10; shoulder horizontal abduction 2x10 red       Modalities   Modalities  Moist Heat  (Pended)       Manual Therapy   Manual Therapy  Soft tissue mobilization    Soft tissue mobilization  IASTYM and trigger point release to upper trap and lower trap/thoracic parapsinals in sitting        Trigger Point Dry Needling - 10/02/18 0646    Consent Given?  Yes    Education Handout Provided  Yes    Upper Trapezius Response  Twitch reponse elicited   upper trap and lower trap           PT Education - 10/02/18 0636    Education Details  benefits znd risks odf trigger point dry needling.     Person(s) Educated  Patient    Methods  Explanation;Demonstration;Tactile cues;Verbal cues    Comprehension  Verbalized understanding;Returned demonstration;Verbal cues required;Tactile cues required;Need further instruction       PT Short Term Goals - 09/18/18 1228      PT SHORT TERM GOAL #1   Title  pt will be I with inital HEP      Time  3    Period  Weeks    Status  New    Target Date  10/09/18      PT SHORT TERM GOAL #2   Title  Patient will report decreased tenderness to palpation     Time  3    Period  Weeks    Status  New      PT SHORT TERM GOAL #3   Title  Patient will demosntrate full left shoulder motion     Time  3    Period  Weeks    Status  New    Target Date  10/09/18        PT Long Term Goals - 09/18/18 1229      PT LONG TERM GOAL #1   Title  Patient will sit at work for an entire day without self report of increased pain     Time  8    Period  Weeks    Status  New    Target Date  11/13/18      PT LONG TERM GOAL #2   Title  Patient will demosntrate a 19% limitation on FOTO     Time  6    Period  Weeks    Status  New    Target Date  10/30/18      PT LONG TERM GOAL #3   Title  Patient will return to the gym without increased pain     Time  6    Period  Weeks    Status  New    Target Date  10/30/18            Plan - 10/02/18 5638    Clinical Impression Statement  Therapy dryn needled her upper trap in 3 spots with a 30x30 needle. She hasd a fgood twtich with each needle. Therapy also needled her left lower trap using a PA approach. Therapy was carefull to find a rib backing. Twitch response ellicted on 2/3 needles. Therapy also added ther-ex and light self thoracic mobilization using a chair back. The patient had no increase in pain with ther-ex.     Clinical Presentation  Stable    Clinical Decision Making  Moderate    PT Frequency  2x / week    PT Duration  6 weeks    PT Treatment/Interventions  ADLs/Self Care Home Management;Iontophoresis 4mg /ml Dexamethasone;Cryotherapy;Moist Heat;Therapeutic activities;Therapeutic exercise;Neuromuscular re-education;Cognitive remediation;Patient/family education;Manual techniques;Manual lymph drainage    PT Next Visit Plan  TPDN to various trigger points; postural correction exercises; Soft tissue mobilization to the upper traps and  periscapular area    PT Home Exercise Plan  rhomiboid stretch; sink stretch; scap retraction; shoulder extension; bilateral ER; bilateral horizontal abduction     Consulted and Agree with Plan of Care  Patient       Patient will benefit from skilled therapeutic intervention in order to improve the following deficits and impairments:  Pain, Postural dysfunction, Decreased activity tolerance, Decreased endurance, Decreased range of motion, Decreased strength  Visit Diagnosis: Pain in thoracic spine  Other muscle spasm  Abnormal posture     Problem List Patient Active Problem List   Diagnosis Date Noted  . Trigger point of left shoulder region 09/21/2015  . Impingement syndrome of right shoulder 03/08/2015  . Trigger point of right shoulder region 03/08/2015  . Low back pain 03/16/2014  . Wrist pain, right 02/09/2014  . Nonallopathic lesion of cervical region 11/03/2013  . Nonallopathic lesion of thoracic region 11/03/2013  . Nonallopathic lesion-rib cage 11/03/2013  . Nonallopathic lesion of lumbosacral region 11/03/2013  . Nonallopathic lesion of sacral region 11/03/2013  . Imbalance 10/27/2012  . Neck pain 10/27/2012  . Headache(784.0) 07/15/2012  . Abdominal cramps 02/25/2012  . Abnormal stools 02/25/2012  . Delayed menses 02/25/2012  . Visit for preventive health examination 12/03/2011  . Abdominal bloating 12/03/2011  . DYSESTHESIA 09/20/2009  . ARREST OF BONE DEVELOPMENT OR GROWTH 07/24/2007  . DERMATOPHYTOSIS OF NAIL 07/10/2007  . ALLERGIC RHINITIS 07/10/2007  . GERD 07/10/2007  . BLOOD IN STOOL 07/10/2007  . HERPES LABIALIS, HX OF 07/10/2007    Dessie Comaavid J Sunni Richardson 10/02/2018, 6:49 AM  [facility] [address] [place], [place], [address] Phone: [phone]   Fax:  [phone]  Name: Jacquelynn Creeamela S Houser MRN: [id number] Date of Birth: 1965-12-31

## 2018-10-05 ENCOUNTER — Encounter: Payer: Self-pay | Admitting: Physical Therapy

## 2018-10-05 ENCOUNTER — Ambulatory Visit: Payer: 59 | Admitting: Physical Therapy

## 2018-10-05 DIAGNOSIS — M62838 Other muscle spasm: Secondary | ICD-10-CM | POA: Diagnosis not present

## 2018-10-05 DIAGNOSIS — M546 Pain in thoracic spine: Secondary | ICD-10-CM | POA: Diagnosis not present

## 2018-10-05 DIAGNOSIS — R293 Abnormal posture: Secondary | ICD-10-CM

## 2018-10-05 NOTE — Therapy (Signed)
[facility] [address] [place], [place], [address] Phone: [phone]   Fax:  [phone]  Physical Therapy Treatment  Patient Details  Name: Valerie [name] MRN: [id number] Date of Birth: 1966/02/21 Referring Provider (PT): Berniece Andreas MD    Encounter Date: 10/05/2018  PT End of Session - 10/05/18 1808    Visit Number  3    Number of Visits  12    Date for PT Re-Evaluation  10/29/18    Authorization Type  [facility]     PT Start Time  1716    PT Stop Time  1811   12 min time for dry needling and 10 min for MHP time not included in direct tx. minutes   PT Time Calculation (min)  55 min    Activity Tolerance  Patient tolerated treatment well    Behavior During Therapy  [facility] for tasks assessed/performed       Past Medical History:  Diagnosis Date  . Allergy    cats  . Anemia    NOS  . 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 GERD (gastroesophageal reflux disease)   . HSV infection    labialis  . RECTAL BLEEDING 07/10/2007   Qualifier: Diagnosis of  By: Fabian Sharp MD, Neta Mends 2008 colonoscopy [facility].     Past Surgical History:  Procedure Laterality Date  . CYST REMOVAL HAND Right   . DILITATION & CURRETTAGE/HYSTROSCOPY WITH VERSAPOINT RESECTION  08/27/2012   Procedure: DILATATION & CURETTAGE/HYSTEROSCOPY WITH VERSAPOINT RESECTION;  Surgeon: Genia Del, MD;  Location: [facility];  Service: Gynecology;;  . LASIK  2009   mono vision  . TRIGGER FINGER RELEASE Right 06/02/2017   Procedure: RELEASE TRIGGER FINGER/A-1 PULLEY RIGHT RING TRIGGER RELEASE;  Surgeon: Betha Loa, MD;  Location: [facility];  Service: Orthopedics;  Laterality: Right;    There were no vitals filed for this visit.  Subjective Assessment - 10/05/18 1806    Subjective  Pt. reports last session helpful for decreased tension but still noting some upper trapezius tightness extending into lower traps and thoracic paraspinal region     Currently in Pain?  Yes    Pain Score  4     Pain Location  Thoracic    Pain Orientation  Right;Left    Pain Descriptors / Indicators  Aching    Pain Type  Chronic pain    Pain Radiating Towards  upper and lower trapezius region bilat.    Pain Onset  More than a month ago    Pain Frequency  Constant    Aggravating Factors   sitting at work    Pain Relieving Factors  rest    Effect of Pain on Daily Activities  increased difficulty tolerating work duties                       [facility] Adult PT Treatment/Exercise - 10/05/18 0001      Moist Heat Therapy   Number Minutes Moist Heat  10 Minutes    Moist Heat Location  Cervical   2 hot packs 1 on ea. upper trapezius     Manual Therapy   Manual Therapy  Joint mobilization;Soft tissue mobilization;Myofascial release;Manual Traction    Joint Mobilization  thoracic PAs grade I-III    Soft tissue mobilization  seated STM bilat. upper trapezius, prone STM thoracic paraspinals and lower traps    Myofascial Release  Thoracic paraspinaks/lower trapezius in prone, supine  to bilat. upper trapezius    Manual Traction  gentle manual cervical traction       Trigger Point Dry Needling - 10/05/18 1804    Consent Given?  Yes    Muscles Treated Upper Body  Upper trapezius   bilat. upper and lower trapezius, thoracic longissimus T9-10   Upper Trapezius Response  Twitch reponse elicited           PT Education - 10/05/18 1808    Education Details  POC    Person(s) Educated  Patient    Methods  Explanation    Comprehension  Verbalized understanding       PT Short Term Goals - 09/18/18 1228      PT SHORT TERM GOAL #1   Title  pt will be I with inital HEP     Time  3    Period  Weeks    Status  New    Target Date  10/09/18      PT SHORT TERM GOAL #2   Title  Patient will report decreased tenderness to palpation     Time  3    Period  Weeks    Status  New      PT SHORT TERM GOAL #3   Title  Patient will demosntrate  full left shoulder motion     Time  3    Period  Weeks    Status  New    Target Date  10/09/18        PT Long Term Goals - 09/18/18 1229      PT LONG TERM GOAL #1   Title  Patient will sit at work for an entire day without self report of increased pain     Time  8    Period  Weeks    Status  New    Target Date  11/13/18      PT LONG TERM GOAL #2   Title  Patient will demosntrate a 19% limitation on FOTO     Time  6    Period  Weeks    Status  New    Target Date  10/30/18      PT LONG TERM GOAL #3   Title  Patient will return to the gym without increased pain     Time  6    Period  Weeks    Status  New    Target Date  10/30/18            Plan - 10/05/18 1809    Clinical Impression Statement  Continued dry needling including bilat. upper trapezius with 32 guage 30 mm needle as well as bilat. lower trapezius and thoracic longissimus respectively with 32 gauge 30 mm and 40 mm needlies in prone with good tolerance, twitch response elicited. Tx. focus otherswise manual therapy as noted perf flowsheet to address soft tissue restriction and improve thoracic mobility. Good tx. response to date but guven etiology and duration of symptoms expect progress will be gradual/ongoing.    Clinical Presentation  Stable    Clinical Decision Making  Moderate    PT Frequency  2x / week    PT Duration  6 weeks    PT Treatment/Interventions  ADLs/Self Care Home Management;Iontophoresis 4mg /ml Dexamethasone;Cryotherapy;Moist Heat;Therapeutic activities;Therapeutic exercise;Neuromuscular re-education;Cognitive remediation;Patient/family education;Manual techniques;Manual lymph drainage    PT Next Visit Plan  TPDN to various trigger points; postural correction exercises; Soft tissue mobilization to the upper traps and periscapular area    PT Home  Exercise Plan  rhomiboid stretch; sink stretch; scap retraction; shoulder extension; bilateral ER; bilateral horizontal abduction     Consulted and  Agree with Plan of Care  Patient       Patient will benefit from skilled therapeutic intervention in order to improve the following deficits and impairments:  Pain, Postural dysfunction, Decreased activity tolerance, Decreased endurance, Decreased range of motion, Decreased strength  Visit Diagnosis: Pain in thoracic spine  Other muscle spasm  Abnormal posture     Problem List Patient Active Problem List   Diagnosis Date Noted  . Trigger point of left shoulder region 09/21/2015  . Impingement syndrome of right shoulder 03/08/2015  . Trigger point of right shoulder region 03/08/2015  . Low back pain 03/16/2014  . Wrist pain, right 02/09/2014  . Nonallopathic lesion of cervical region 11/03/2013  . Nonallopathic lesion of thoracic region 11/03/2013  . Nonallopathic lesion-rib cage 11/03/2013  . Nonallopathic lesion of lumbosacral region 11/03/2013  . Nonallopathic lesion of sacral region 11/03/2013  . Imbalance 10/27/2012  . Neck pain 10/27/2012  . Headache(784.0) 07/15/2012  . Abdominal cramps 02/25/2012  . Abnormal stools 02/25/2012  . Delayed menses 02/25/2012  . Visit for preventive health examination 12/03/2011  . Abdominal bloating 12/03/2011  . DYSESTHESIA 09/20/2009  . ARREST OF BONE DEVELOPMENT OR GROWTH 07/24/2007  . DERMATOPHYTOSIS OF NAIL 07/10/2007  . ALLERGIC RHINITIS 07/10/2007  . GERD 07/10/2007  . BLOOD IN STOOL 07/10/2007  . HERPES LABIALIS, HX OF 07/10/2007    Lazarus Gowdahristopher Kourtnei Rauber, PT, DPT 10/05/18 6:13 PM  [facility] [address] [place], [place], [address] Phone: [phone]   Fax:  [phone]  Name: Jacquelynn Creeamela S Pechacek MRN: [id number] Date of Birth: 1966/03/13

## 2018-10-08 ENCOUNTER — Ambulatory Visit: Payer: 59 | Admitting: Physical Therapy

## 2018-10-08 DIAGNOSIS — R293 Abnormal posture: Secondary | ICD-10-CM | POA: Diagnosis not present

## 2018-10-08 DIAGNOSIS — M546 Pain in thoracic spine: Secondary | ICD-10-CM | POA: Diagnosis not present

## 2018-10-08 DIAGNOSIS — M62838 Other muscle spasm: Secondary | ICD-10-CM

## 2018-10-09 ENCOUNTER — Encounter: Payer: Self-pay | Admitting: Physical Therapy

## 2018-10-09 NOTE — Therapy (Signed)
[facility] [address] [place], [place], [address] Phone: [phone]   Fax:  [phone]  Physical Therapy Treatment  Patient Details  Name: Valerie [name] MRN: [id number] Date of Birth: 1965-09-06 Referring Provider (PT): Berniece Andreas MD    Encounter Date: 10/08/2018  PT End of Session - 10/09/18 0757    Visit Number  4    Number of Visits  12    Date for PT Re-Evaluation  10/29/18    Authorization Type  [facility]     PT Start Time  1632    PT Stop Time  1721    PT Time Calculation (min)  49 min    Activity Tolerance  Patient tolerated treatment well    Behavior During Therapy  [facility] for tasks assessed/performed       Past Medical History:  Diagnosis Date  . Allergy    cats  . Anemia    NOS  . 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 GERD (gastroesophageal reflux disease)   . HSV infection    labialis  . RECTAL BLEEDING 07/10/2007   Qualifier: Diagnosis of  By: Fabian Sharp MD, Neta Mends 2008 colonoscopy [facility].     Past Surgical History:  Procedure Laterality Date  . CYST REMOVAL HAND Right   . DILITATION & CURRETTAGE/HYSTROSCOPY WITH VERSAPOINT RESECTION  08/27/2012   Procedure: DILATATION & CURETTAGE/HYSTEROSCOPY WITH VERSAPOINT RESECTION;  Surgeon: Genia Del, MD;  Location: [facility];  Service: Gynecology;;  . LASIK  2009   mono vision  . TRIGGER FINGER RELEASE Right 06/02/2017   Procedure: RELEASE TRIGGER FINGER/A-1 PULLEY RIGHT RING TRIGGER RELEASE;  Surgeon: Betha Loa, MD;  Location: [facility];  Service: Orthopedics;  Laterality: Right;    There were no vitals filed for this visit.  Subjective Assessment - 10/09/18 0755    Subjective  Patient reports some soreness after the needleding but it has reduced. She is having some pain in her upper traps today. She was needled 2 visits ago so therapy will focus on soft tissue mobilization and exercises.     Limitations  Other (comment)    How long can you sit comfortably?  No limit    How long can you stand comfortably?  No limit     How long can you walk comfortably?  No limit     Patient Stated Goals  Less pain, start exercising again     Currently in Pain?  No/denies                       [facility] Adult PT Treatment/Exercise - 10/09/18 0001      Neck Exercises: Standing   Other Standing Exercises  scap retraction red 2x10; shoulder extension red 2x10       Neck Exercises: Seated   Other Seated Exercise  seated thoracic extension x10 with cuing for postinin of the back of the chair      Moist Heat Therapy   Number Minutes Moist Heat  10 Minutes    Moist Heat Location  Cervical      Manual Therapy   Joint Mobilization  thoracic PAs grade I-III    Soft tissue mobilization  seated IASTM bilat. upper trapezius, prone STM thoracic paraspinals and lower traps    Myofascial Release  Thoracic paraspinaks/lower trapezius in prone, supine to bilat. upper trapezius    Manual Traction  gentle manual cervical traction  PT Education - 10/09/18 0756    Education Details  reviewed benefits of ther-ex     Person(s) Educated  Patient    Methods  Explanation;Demonstration;Tactile cues;Verbal cues    Comprehension  Verbalized understanding;Returned demonstration;Verbal cues required;Tactile cues required       PT Short Term Goals - 10/09/18 0803      PT SHORT TERM GOAL #1   Title  pt will be I with inital HEP     Time  3    Period  Weeks    Status  On-going      PT SHORT TERM GOAL #2   Title  Patient will report decreased tenderness to palpation     Time  3    Period  Weeks    Status  On-going      PT SHORT TERM GOAL #3   Title  Patient will demosntrate full left shoulder motion     Time  3    Period  Weeks    Status  On-going        PT Long Term Goals - 09/18/18 1229      PT LONG TERM GOAL #1   Title  Patient will sit at work for an entire day without self report of increased  pain     Time  8    Period  Weeks    Status  New    Target Date  11/13/18      PT LONG TERM GOAL #2   Title  Patient will demosntrate a 19% limitation on FOTO     Time  6    Period  Weeks    Status  New    Target Date  10/30/18      PT LONG TERM GOAL #3   Title  Patient will return to the gym without increased pain     Time  6    Period  Weeks    Status  New    Target Date  10/30/18            Plan - 10/09/18 0758    Clinical Impression Statement  Patient continus to have tendenerss to palpation in her right lower trap. Therapy focused on manual therapy to reduce spasming. OIverall she appears to cotninue to hav improved muscle tightness in her upper baclk. She was encouraged to continue her exercises at home and continue to be aware of her posture.     Clinical Presentation  Stable    Clinical Decision Making  Low    PT Frequency  2x / week    PT Duration  6 weeks    PT Treatment/Interventions  ADLs/Self Care Home Management;Iontophoresis 4mg /ml Dexamethasone;Cryotherapy;Moist Heat;Therapeutic activities;Therapeutic exercise;Neuromuscular re-education;Cognitive remediation;Patient/family education;Manual techniques;Manual lymph drainage    PT Next Visit Plan  TPDN to various trigger points; postural correction exercises; Soft tissue mobilization to the upper traps and periscapular area    PT Home Exercise Plan  rhomiboid stretch; sink stretch; scap retraction; shoulder extension; bilateral ER; bilateral horizontal abduction     Consulted and Agree with Plan of Care  Patient       Patient will benefit from skilled therapeutic intervention in order to improve the following deficits and impairments:  Pain, Postural dysfunction, Decreased activity tolerance, Decreased endurance, Decreased range of motion, Decreased strength  Visit Diagnosis: Pain in thoracic spine  Other muscle spasm  Abnormal posture     Problem List Patient Active Problem List   Diagnosis Date  Noted  . Trigger  point of left shoulder region 09/21/2015  . Impingement syndrome of right shoulder 03/08/2015  . Trigger point of right shoulder region 03/08/2015  . Low back pain 03/16/2014  . Wrist pain, right 02/09/2014  . Nonallopathic lesion of cervical region 11/03/2013  . Nonallopathic lesion of thoracic region 11/03/2013  . Nonallopathic lesion-rib cage 11/03/2013  . Nonallopathic lesion of lumbosacral region 11/03/2013  . Nonallopathic lesion of sacral region 11/03/2013  . Imbalance 10/27/2012  . Neck pain 10/27/2012  . Headache(784.0) 07/15/2012  . Abdominal cramps 02/25/2012  . Abnormal stools 02/25/2012  . Delayed menses 02/25/2012  . Visit for preventive health examination 12/03/2011  . Abdominal bloating 12/03/2011  . DYSESTHESIA 09/20/2009  . ARREST OF BONE DEVELOPMENT OR GROWTH 07/24/2007  . DERMATOPHYTOSIS OF NAIL 07/10/2007  . ALLERGIC RHINITIS 07/10/2007  . GERD 07/10/2007  . BLOOD IN STOOL 07/10/2007  . HERPES LABIALIS, HX OF 07/10/2007    Dessie Comaavid J Opie Fanton PT DPT   10/09/2018, 12:05 PM  [facility] [address] [place], [place], [address] Phone: [phone]   Fax:  [phone]  Name: Valerie Creeamela S [name] MRN: [id number] Date of Birth: November 20, 1965

## 2018-10-12 ENCOUNTER — Encounter: Payer: Self-pay | Admitting: Physical Therapy

## 2018-10-12 ENCOUNTER — Ambulatory Visit: Payer: 59 | Admitting: Physical Therapy

## 2018-10-12 DIAGNOSIS — R293 Abnormal posture: Secondary | ICD-10-CM | POA: Diagnosis not present

## 2018-10-12 DIAGNOSIS — M546 Pain in thoracic spine: Secondary | ICD-10-CM

## 2018-10-12 DIAGNOSIS — M62838 Other muscle spasm: Secondary | ICD-10-CM

## 2018-10-12 NOTE — Therapy (Signed)
[place], [place], [address] Phone: [phone]   Fax:  [phone]  Physical Therapy Treatment  Patient Details  Name: Valerie [name] MRN: [id number] Date of Birth: 08-09-1966 Referring Provider (PT): Shanon Ace MD    Encounter Date: 10/12/2018  PT End of Session - 10/12/18 1811    Visit Number  5    Number of Visits  12    Date for PT Re-Evaluation  10/29/18    Authorization Type  [facility]     PT Start Time  2585    PT Stop Time  1812   22 min of time for hot pack, pt. changing and time spent dry needling not included in timed minutes    PT Time Calculation (min)  55 min    Activity Tolerance  Patient tolerated treatment well    Behavior During Therapy  [facility] for tasks assessed/performed       Past Medical History:  Diagnosis Date  . Allergy    cats  . Anemia    NOS  . 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 GERD (gastroesophageal reflux disease)   . HSV infection    labialis  . RECTAL BLEEDING 07/10/2007   Qualifier: Diagnosis of  By: Regis Bill MD, Standley Brooking 2008 colonoscopy [facility].     Past Surgical History:  Procedure Laterality Date  . CYST REMOVAL HAND Right   . DILITATION & CURRETTAGE/HYSTROSCOPY WITH VERSAPOINT RESECTION  08/27/2012   Procedure: DILATATION & CURETTAGE/HYSTEROSCOPY WITH VERSAPOINT RESECTION;  Surgeon: Princess Bruins, MD;  Location: [facility];  Service: Gynecology;;  . LASIK  2009   mono vision  . TRIGGER FINGER RELEASE Right 06/02/2017   Procedure: RELEASE TRIGGER FINGER/A-1 PULLEY RIGHT RING TRIGGER RELEASE;  Surgeon: Leanora Cover, MD;  Location: [place];  Service: Orthopedics;  Laterality: Right;    There were no vitals filed for this visit.  Subjective Assessment - 10/12/18 1804    Subjective  Primary pain noted left upper trapezius region but also noting some tightness/soreness in bilat. thoracic paraspinals. Pt. interested in dry needling  today.    Currently in Pain?  Yes    Pain Score  4     Pain Location  Thoracic    Pain Orientation  Left    Pain Descriptors / Indicators  Aching    Pain Type  Chronic pain    Pain Radiating Towards  left upper trapezius    Pain Onset  More than a month ago    Pain Frequency  Constant    Aggravating Factors   sitting at work    Pain Relieving Factors  rest, heat    Effect of Pain on Daily Activities  limits positional tolerance for work duties                       [employer] Adult PT Treatment/Exercise - 10/12/18 0001      Moist Heat Therapy   Number Minutes Moist Heat  10 Minutes    Moist Heat Location  Cervical      Manual Therapy   Joint Mobilization  thoracic PAs grade I-IV    Soft tissue mobilization  STM in prone thoracic paraspinals, STM in sitting left upper trapezius    Myofascial Release  thoracic paraspinals    Manual Traction  gentle manual cervical traction      Neck Exercises: Stretches   Upper Trapezius Stretch  Left;3 reps;30  seconds   supine manual stretch      Trigger Point Dry Needling - 10/12/18 1805    Consent Given?  Yes   used 30 mm 32 gauge needles, prone positioning   Muscles Treated Upper Body  Upper trapezius;Longissimus   bilat. upper traps and thoracic longissimus T8 region            PT Short Term Goals - 10/12/18 1810      PT SHORT TERM GOAL #1   Title  pt will be I with inital HEP     Baseline  ongoing    Time  3    Period  Weeks    Status  On-going      PT SHORT TERM GOAL #2   Title  Patient will report decreased tenderness to palpation     Baseline  Improving from baseline but still with tenderness upper traps and thoracic region    Time  3    Period  Weeks    Status  On-going      PT SHORT TERM GOAL #3   Title  Patient will demosntrate full left shoulder motion     Baseline  met/able    Time  3    Period  Weeks    Status  On-going        PT Long Term Goals - 10/12/18 1808      PT LONG TERM GOAL  #1   Title  Patient will sit at work for an entire day without self report of increased pain     Baseline  still has pain increase, today 4/10    Time  6    Period  Weeks    Status  On-going      PT LONG TERM GOAL #2   Title  Patient will demosntrate a 19% limitation on FOTO     Baseline  FOTO not retested today    Time  6    Period  Weeks    Status  On-going      PT LONG TERM GOAL #3   Title  Patient will return to the gym without increased pain     Time  6    Period  Weeks    Status  On-going            Plan - 10/12/18 1818    Clinical Impression Statement  Continued dry needling and otherwise performed manual therpay focus to decrease postural tension. Still with pain but (pain) level improving from baseline and shoulder ROM improving with functional gains for reaching activities.    PT Frequency  2x / week    PT Duration  6 weeks    PT Treatment/Interventions  ADLs/Self Care Home Management;Iontophoresis 53m/ml Dexamethasone;Cryotherapy;Moist Heat;Therapeutic activities;Therapeutic exercise;Neuromuscular re-education;Cognitive remediation;Patient/family education;Manual techniques;Manual lymph drainage    PT Next Visit Plan  TPDN to various trigger points; postural correction exercises; Soft tissue mobilization to the upper traps and periscapular area    PT Home Exercise Plan  rhomiboid stretch; sink stretch; scap retraction; shoulder extension; bilateral ER; bilateral horizontal abduction     Consulted and Agree with Plan of Care  Patient       Patient will benefit from skilled therapeutic intervention in order to improve the following deficits and impairments:     Visit Diagnosis: Pain in thoracic spine  Other muscle spasm  Abnormal posture     Problem List Patient Active Problem List   Diagnosis Date Noted  . Trigger point of left shoulder  region 09/21/2015  . Impingement syndrome of right shoulder 03/08/2015  . Trigger point of right shoulder region  03/08/2015  . Low back pain 03/16/2014  . Wrist pain, right 02/09/2014  . Nonallopathic lesion of cervical region 11/03/2013  . Nonallopathic lesion of thoracic region 11/03/2013  . Nonallopathic lesion-rib cage 11/03/2013  . Nonallopathic lesion of lumbosacral region 11/03/2013  . Nonallopathic lesion of sacral region 11/03/2013  . Imbalance 10/27/2012  . Neck pain 10/27/2012  . Headache(784.0) 07/15/2012  . Abdominal cramps 02/25/2012  . Abnormal stools 02/25/2012  . Delayed menses 02/25/2012  . Visit for preventive health examination 12/03/2011  . Abdominal bloating 12/03/2011  . DYSESTHESIA 09/20/2009  . ARREST OF BONE DEVELOPMENT OR GROWTH 07/24/2007  . DERMATOPHYTOSIS OF NAIL 07/10/2007  . ALLERGIC RHINITIS 07/10/2007  . GERD 07/10/2007  . BLOOD IN STOOL 07/10/2007  . HERPES LABIALIS, HX OF 07/10/2007   Beaulah Dinning, PT, DPT 10/12/18 6:20 PM  [facility] [address] [place], [place], [address] Phone: [phone]   Fax:  [phone]  Name: Valerie [name] MRN: [id number] Date of Birth: 1965-12-12

## 2018-10-14 ENCOUNTER — Ambulatory Visit: Payer: 59 | Admitting: Physical Therapy

## 2018-10-14 DIAGNOSIS — R293 Abnormal posture: Secondary | ICD-10-CM

## 2018-10-14 DIAGNOSIS — M62838 Other muscle spasm: Secondary | ICD-10-CM | POA: Diagnosis not present

## 2018-10-14 DIAGNOSIS — M546 Pain in thoracic spine: Secondary | ICD-10-CM

## 2018-10-15 ENCOUNTER — Encounter: Payer: Self-pay | Admitting: Physical Therapy

## 2018-10-15 NOTE — Therapy (Signed)
[place], [place], [address] Phone: [phone]   Fax:  [phone]  Physical Therapy Treatment  Patient Details  Name: Valerie [name] MRN: [id number] Date of Birth: 05-15-1966 Referring Provider (PT): Shanon Ace MD    Encounter Date: 10/14/2018  PT End of Session - 10/15/18 0930    Visit Number  6    Number of Visits  12    Date for PT Re-Evaluation  10/29/18    Authorization Type  [facility] [employer]     PT Start Time  1630    PT Stop Time  1720    PT Time Calculation (min)  50 min    Activity Tolerance  Patient tolerated treatment well    Behavior During Therapy  [facility] for tasks assessed/performed       Past Medical History:  Diagnosis Date  . Allergy    cats  . Anemia    NOS  . 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 GERD (gastroesophageal reflux disease)   . HSV infection    labialis  . RECTAL BLEEDING 07/10/2007   Qualifier: Diagnosis of  By: Regis Bill MD, Standley Brooking 2008 colonoscopy [facility].     Past Surgical History:  Procedure Laterality Date  . CYST REMOVAL HAND Right   . DILITATION & CURRETTAGE/HYSTROSCOPY WITH VERSAPOINT RESECTION  08/27/2012   Procedure: DILATATION & CURETTAGE/HYSTEROSCOPY WITH VERSAPOINT RESECTION;  Surgeon: Princess Bruins, MD;  Location: [facility];  Service: Gynecology;;  . LASIK  2009   mono vision  . TRIGGER FINGER RELEASE Right 06/02/2017   Procedure: RELEASE TRIGGER FINGER/A-1 PULLEY RIGHT RING TRIGGER RELEASE;  Surgeon: Leanora Cover, MD;  Location: [employer];  Service: Orthopedics;  Laterality: Right;    There were no vitals filed for this visit.  Subjective Assessment - 10/15/18 0928    Subjective  Patien reports at this time she is just feeling tight between her shoulder blades. The pain in her upper trap has decreased. She feels like her upper traps are much better.     Pertinent History  cervical spine pain; Low back pain     Limitations   Other (comment)    How long can you sit comfortably?  No limit    How long can you stand comfortably?  No limit     Patient Stated Goals  Less pain, start exercising again     Currently in Pain?  No/denies   just a little achy                       [facility] Adult PT Treatment/Exercise - 10/15/18 0001      Shoulder Exercises: Supine   Other Supine Exercises  d2 flexion 2x10       Moist Heat Therapy   Number Minutes Moist Heat  10 Minutes    Moist Heat Location  Cervical;Other (comment)   thoracic      Manual Therapy   Joint Mobilization  thoracic PAs grade I-IV    Soft tissue mobilization  STM in prone thoracic paraspinals, STM in sitting left upper trapezius    Myofascial Release  thoracic paraspinals    Manual Traction  gentle manual cervical traction      Neck Exercises: Stretches   Upper Trapezius Stretch  Left;3 reps;30 seconds   supine manual stretch   Other Neck Stretches  rhombod stretch seated and at the sink 2x20 sec each, posterior capsule stretch 3x20 se  chold bilateral              PT Education - 10/15/18 0930    Education Details  HEP, symptom mangement     Person(s) Educated  Patient    Methods  Explanation;Tactile cues;Demonstration;Verbal cues    Comprehension  Verbalized understanding;Returned demonstration;Verbal cues required;Tactile cues required;Need further instruction       PT Short Term Goals - 10/12/18 1810      PT SHORT TERM GOAL #1   Title  pt will be I with inital HEP     Baseline  ongoing    Time  3    Period  Weeks    Status  On-going      PT SHORT TERM GOAL #2   Title  Patient will report decreased tenderness to palpation     Baseline  Improving from baseline but still with tenderness upper traps and thoracic region    Time  3    Period  Weeks    Status  On-going      PT SHORT TERM GOAL #3   Title  Patient will demosntrate full left shoulder motion     Baseline  met/able    Time  3    Period  Weeks    Status   On-going        PT Long Term Goals - 10/12/18 1808      PT LONG TERM GOAL #1   Title  Patient will sit at work for an entire day without self report of increased pain     Baseline  still has pain increase, today 4/10    Time  6    Period  Weeks    Status  On-going      PT LONG TERM GOAL #2   Title  Patient will demosntrate a 19% limitation on FOTO     Baseline  FOTO not retested today    Time  6    Period  Weeks    Status  On-going      PT LONG TERM GOAL #3   Title  Patient will return to the gym without increased pain     Time  6    Period  Weeks    Status  On-going            Plan - 10/15/18 0931    Clinical Impression Statement  Patient continues to make porgress. She has tednerness around the left lower trap/thoriacic paraspinals area. Therapy added supine D2 flexion today to work lower scpaular area. Therapy also added a rhomboid anbd posteriro capsule     Clinical Presentation  Stable    Clinical Decision Making  Low    Rehab Potential  Good    PT Frequency  2x / week    PT Duration  6 weeks    PT Treatment/Interventions  ADLs/Self Care Home Management;Iontophoresis 32m/ml Dexamethasone;Cryotherapy;Moist Heat;Therapeutic activities;Therapeutic exercise;Neuromuscular re-education;Cognitive remediation;Patient/family education;Manual techniques;Manual lymph drainage    PT Next Visit Plan  TPDN to various trigger points; postural correction exercises; Soft tissue mobilization to the upper traps and periscapular area; assess toletr    PT Home Exercise Plan  rhomiboid stretch; sink stretch; scap retraction; shoulder extension; bilateral ER; bilateral horizontal abduction     Consulted and Agree with Plan of Care  Patient       Patient will benefit from skilled therapeutic intervention in order to improve the following deficits and impairments:  Pain, Postural dysfunction, Decreased activity tolerance, Decreased endurance, Decreased range of  motion, Decreased  strength  Visit Diagnosis: Pain in thoracic spine  Other muscle spasm  Abnormal posture     Problem List Patient Active Problem List   Diagnosis Date Noted  . Trigger point of left shoulder region 09/21/2015  . Impingement syndrome of right shoulder 03/08/2015  . Trigger point of right shoulder region 03/08/2015  . Low back pain 03/16/2014  . Wrist pain, right 02/09/2014  . Nonallopathic lesion of cervical region 11/03/2013  . Nonallopathic lesion of thoracic region 11/03/2013  . Nonallopathic lesion-rib cage 11/03/2013  . Nonallopathic lesion of lumbosacral region 11/03/2013  . Nonallopathic lesion of sacral region 11/03/2013  . Imbalance 10/27/2012  . Neck pain 10/27/2012  . Headache(784.0) 07/15/2012  . Abdominal cramps 02/25/2012  . Abnormal stools 02/25/2012  . Delayed menses 02/25/2012  . Visit for preventive health examination 12/03/2011  . Abdominal bloating 12/03/2011  . DYSESTHESIA 09/20/2009  . ARREST OF BONE DEVELOPMENT OR GROWTH 07/24/2007  . DERMATOPHYTOSIS OF NAIL 07/10/2007  . ALLERGIC RHINITIS 07/10/2007  . GERD 07/10/2007  . BLOOD IN STOOL 07/10/2007  . HERPES LABIALIS, HX OF 07/10/2007    Carney Living PT DPT  10/15/2018, 11:18 AM  [facility] [address] [place], [place], [address] Phone: [phone]   Fax:  [phone]  Name: ELMER BOUTELLE MRN: [id number] Date of Birth: 04-10-1966

## 2018-10-20 ENCOUNTER — Ambulatory Visit: Payer: 59 | Admitting: Physical Therapy

## 2018-10-28 DIAGNOSIS — M65332 Trigger finger, left middle finger: Secondary | ICD-10-CM | POA: Diagnosis not present

## 2018-11-03 ENCOUNTER — Other Ambulatory Visit: Payer: Self-pay

## 2018-11-03 ENCOUNTER — Ambulatory Visit: Payer: 59 | Attending: Internal Medicine | Admitting: Physical Therapy

## 2018-11-03 ENCOUNTER — Encounter: Payer: Self-pay | Admitting: Physical Therapy

## 2018-11-03 DIAGNOSIS — M62838 Other muscle spasm: Secondary | ICD-10-CM | POA: Diagnosis not present

## 2018-11-03 DIAGNOSIS — R293 Abnormal posture: Secondary | ICD-10-CM | POA: Diagnosis not present

## 2018-11-03 DIAGNOSIS — M546 Pain in thoracic spine: Secondary | ICD-10-CM | POA: Diagnosis not present

## 2018-11-03 NOTE — Therapy (Signed)
[place], [place], [address] Phone: [phone]   Fax:  [phone]  Physical Therapy Treatment  Patient Details  Name: EVERETT RICCIARDELLI MRN: [id number] Date of Birth: 06/15/1966 Referring Provider (PT): Shanon Ace MD    Encounter Date: 11/03/2018  PT End of Session - 11/03/18 1957    Visit Number  7    Number of Visits  16    Date for PT Re-Evaluation  12/15/18    Authorization Type  [facility]     PT Start Time  1632    PT Stop Time  5732   time for hot pack and dry needling not included in direct minutes   PT Time Calculation (min)  53 min    Activity Tolerance  Patient tolerated treatment well    Behavior During Therapy  [facility] for tasks assessed/performed       Past Medical History:  Diagnosis Date  . Allergy    cats  . Anemia    NOS  . 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 GERD (gastroesophageal reflux disease)   . HSV infection    labialis  . RECTAL BLEEDING 07/10/2007   Qualifier: Diagnosis of  By: Regis Bill MD, Standley Brooking 2008 colonoscopy [facility].     Past Surgical History:  Procedure Laterality Date  . CYST REMOVAL HAND Right   . DILITATION & CURRETTAGE/HYSTROSCOPY WITH VERSAPOINT RESECTION  08/27/2012   Procedure: DILATATION & CURETTAGE/HYSTEROSCOPY WITH VERSAPOINT RESECTION;  Surgeon: Princess Bruins, MD;  Location: [facility];  Service: Gynecology;;  . LASIK  2009   mono vision  . TRIGGER FINGER RELEASE Right 06/02/2017   Procedure: RELEASE TRIGGER FINGER/A-1 PULLEY RIGHT RING TRIGGER RELEASE;  Surgeon: Leanora Cover, MD;  Location: [facility];  Service: Orthopedics;  Laterality: Right;    There were no vitals filed for this visit.  Subjective Assessment - 11/03/18 1950    Subjective  Pt. returns, not seen for therapy for the past 2 weeks due to scheduling difficulties. She reports upper trapezius region pain has improved but today c/o pain in lateral  thoracic/latissimus region on left>right as well as right lumbar and QL region pain.     Pertinent History  cervical spine pain; Low back pain     Currently in Pain?  Yes    Pain Score  4     Pain Location  Thoracic    Pain Orientation  Left;Right    Pain Descriptors / Indicators  Aching;Burning    Pain Type  Chronic pain    Pain Onset  More than a month ago    Pain Frequency  Constant    Aggravating Factors   sitting at work    Pain Relieving Factors  rest, heat    Effect of Pain on Daily Activities  limits positional tolerance for work duties         [facility] PT Assessment - 11/03/18 0001      Observation/Other Assessments   Focus on Therapeutic Outcomes (FOTO)   35% limited      Strength   Right Shoulder Flexion  4+/5    Right Shoulder ABduction  4+/5    Left Shoulder Flexion  4+/5    Left Shoulder ABduction  4+/5                   [facility] Adult PT Treatment/Exercise - 11/03/18 0001      Exercises   Exercises  Shoulder;Lumbar  Lumbar Exercises: Stretches   Other Lumbar Stretch Exercise  manual right QL stretch in sidelying 3x30 sec, supine manual right lumbar/QL stretch 3x30 sec      Shoulder Exercises: Stretch   Other Shoulder Stretches  supine manual left latissimus stretch 3x30 sec      Moist Heat Therapy   Number Minutes Moist Heat  10 Minutes    Moist Heat Location  --   left latissimus and right lumbar region in supine     Manual Therapy   Soft tissue mobilization  STM left latissmus/lateral thoracic region, right lumbar paraspinals       Trigger Point Dry Needling - 11/03/18 0001    Consent Given?  Yes    Muscles Treated Upper Quadrant  Latissimus dorsi   left   Muscles Treated Back/Hip  Erector spinae;Quadratus lumborum   incl. lumbar iliocostalis   Dry Needling Comments  30 gauge 40 mm needles for latissmus and erector spinae, 75 mm 30 gauge needle for QL in sidelying           PT Education - 11/03/18 1957    Education Details   POC    Person(s) Educated  Patient    Methods  Explanation    Comprehension  Verbalized understanding       PT Short Term Goals - 11/03/18 2005      PT SHORT TERM GOAL #1   Title  pt will be I with inital HEP     Baseline  updates ongoing    Time  3    Period  Weeks    Status  On-going    Target Date  11/24/18      PT SHORT TERM GOAL #2   Title  Patient will report decreased tenderness to palpation     Baseline  Improving from baseline but still with tenderness upper traps and thoracic region    Time  3    Period  Weeks    Status  On-going    Target Date  11/24/18      PT SHORT TERM GOAL #3   Title  Patient will demosntrate full left shoulder motion     Baseline  met/able    Time  3    Period  Weeks    Status  Achieved        PT Long Term Goals - 11/03/18 2005      PT LONG TERM GOAL #1   Title  Patient will sit at work for an entire day without self report of increased pain     Baseline  still has pain increase, today 4/10    Time  6    Period  Weeks    Status  On-going    Target Date  12/15/18      PT LONG TERM GOAL #2   Title  Patient will demosntrate a 19% limitation on FOTO     Baseline  35% limited    Time  6    Period  Weeks    Status  On-going    Target Date  12/15/18      PT LONG TERM GOAL #3   Title  Patient will return to the gym without increased pain     Baseline  still unable    Time  6    Period  Weeks    Status  On-going    Target Date  12/15/18            Plan - 11/03/18 2000  Clinical Impression Statement  Fair progress with therapy to date impacted by limited recent attendance/scheduling issues. Pt. improving with upper trapezius region pain from baseline status. Still with tendency thoracic region pain with contributing postural tension consistent with myofascial etiology. Plan continue PT for further progress to address remaining functional limitations.    Stability/Clinical Decision Making  Stable/Uncomplicated    Clinical  Decision Making  Low    Rehab Potential  Good    PT Frequency  2x / week    PT Duration  6 weeks    PT Treatment/Interventions  ADLs/Self Care Home Management;Iontophoresis 33m/ml Dexamethasone;Cryotherapy;Moist Heat;Therapeutic activities;Therapeutic exercise;Neuromuscular re-education;Cognitive remediation;Patient/family education;Manual techniques;Manual lymph drainage    PT Next Visit Plan  TPDN to various trigger points; postural correction exercises; Soft tissue mobilization to the upper traps and periscapular area; assess toletr    PT Home Exercise Plan  rhomboid stretch; sink stretch; scap retraction; shoulder extension; bilateral ER; bilateral horizontal abduction     Consulted and Agree with Plan of Care  Patient       Patient will benefit from skilled therapeutic intervention in order to improve the following deficits and impairments:  Pain, Postural dysfunction, Decreased activity tolerance, Decreased endurance, Decreased range of motion, Decreased strength  Visit Diagnosis: Pain in thoracic spine  Other muscle spasm  Abnormal posture     Problem List Patient Active Problem List   Diagnosis Date Noted  . Trigger point of left shoulder region 09/21/2015  . Impingement syndrome of right shoulder 03/08/2015  . Trigger point of right shoulder region 03/08/2015  . Low back pain 03/16/2014  . Wrist pain, right 02/09/2014  . Nonallopathic lesion of cervical region 11/03/2013  . Nonallopathic lesion of thoracic region 11/03/2013  . Nonallopathic lesion-rib cage 11/03/2013  . Nonallopathic lesion of lumbosacral region 11/03/2013  . Nonallopathic lesion of sacral region 11/03/2013  . Imbalance 10/27/2012  . Neck pain 10/27/2012  . Headache(784.0) 07/15/2012  . Abdominal cramps 02/25/2012  . Abnormal stools 02/25/2012  . Delayed menses 02/25/2012  . Visit for preventive health examination 12/03/2011  . Abdominal bloating 12/03/2011  . DYSESTHESIA 09/20/2009  . ARREST OF  BONE DEVELOPMENT OR GROWTH 07/24/2007  . DERMATOPHYTOSIS OF NAIL 07/10/2007  . ALLERGIC RHINITIS 07/10/2007  . GERD 07/10/2007  . BLOOD IN STOOL 07/10/2007  . HERPES LABIALIS, HX OF 07/10/2007    CBeaulah Dinning PT, DPT 11/03/18 8:09 PM  [facility] StreetGHuntingtown [place] [address]Phone: [phone]  Fax:  [phone] Name: PNYELLIE YETTERMRN: [id number] of Birth: [birth year]

## 2018-11-05 MED FILL — EPINEPHRINE 0.3 MG AUTO-INJ: 0.3 | 30 days supply | Qty: 2 | Fill #0

## 2018-11-09 ENCOUNTER — Ambulatory Visit: Payer: 59 | Admitting: Physical Therapy

## 2018-11-11 ENCOUNTER — Ambulatory Visit: Payer: 59 | Admitting: Physical Therapy

## 2018-11-16 ENCOUNTER — Ambulatory Visit: Payer: 59 | Admitting: Physical Therapy

## 2018-11-26 ENCOUNTER — Ambulatory Visit: Payer: 59 | Admitting: Physical Therapy

## 2018-12-02 ENCOUNTER — Encounter: Payer: 59 | Admitting: Physical Therapy

## 2018-12-07 ENCOUNTER — Encounter: Payer: 59 | Admitting: Physical Therapy

## 2018-12-09 ENCOUNTER — Encounter: Payer: 59 | Admitting: Physical Therapy

## 2018-12-14 ENCOUNTER — Encounter: Payer: 59 | Admitting: Physical Therapy

## 2018-12-22 ENCOUNTER — Telehealth: Payer: Self-pay | Admitting: Physical Therapy

## 2018-12-22 NOTE — Telephone Encounter (Signed)
Valerie [name] was contacted today regarding transition back to in-person OP Rehab Services due to Covid-19. Left voicemail requesting the patient to return to to clinic if she would like to resume PT at this time.

## 2018-12-25 IMAGING — MG DIGITAL SCREENING BILATERAL MAMMOGRAM WITH TOMO AND CAD
8 series · 9 of 24 positions shown · non-contrast
Comparison: Previous exam(s).

CLINICAL DATA: Screening.

EXAM:
DIGITAL SCREENING BILATERAL MAMMOGRAM WITH TOMO AND CAD

[R MLO synth-2D]
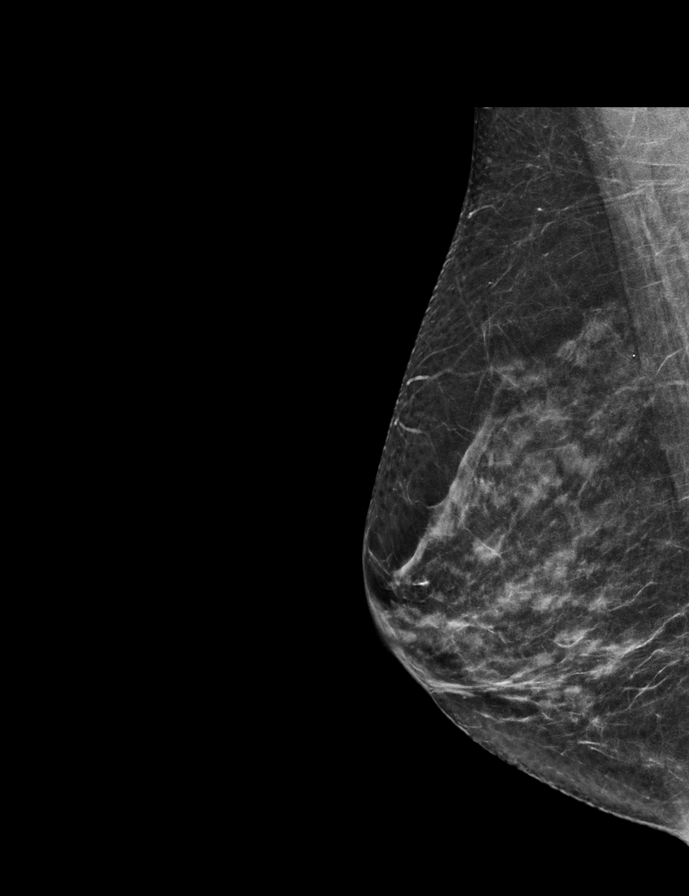

[R CC synth-2D]
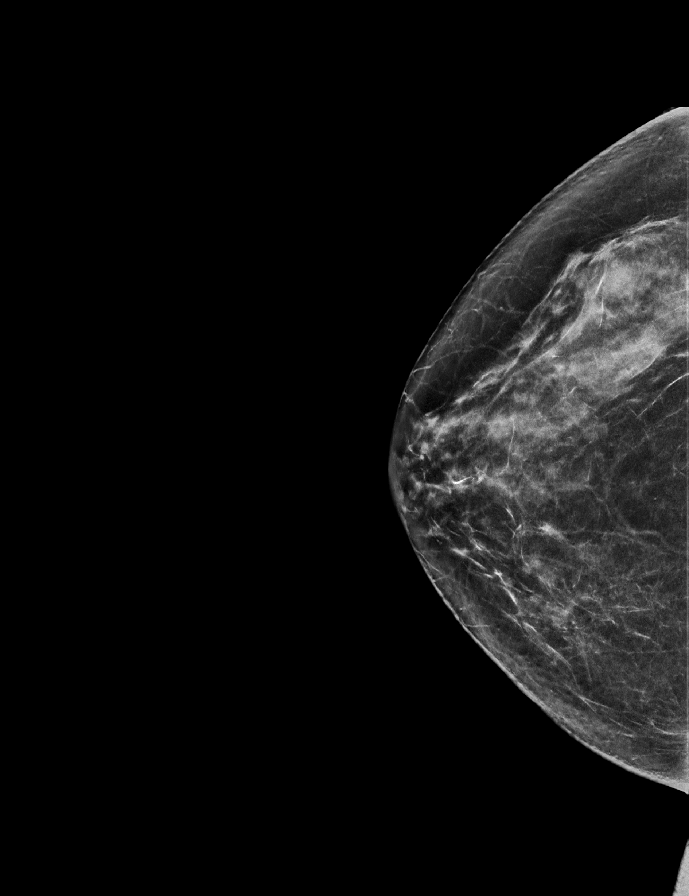

[L CC synth-2D]
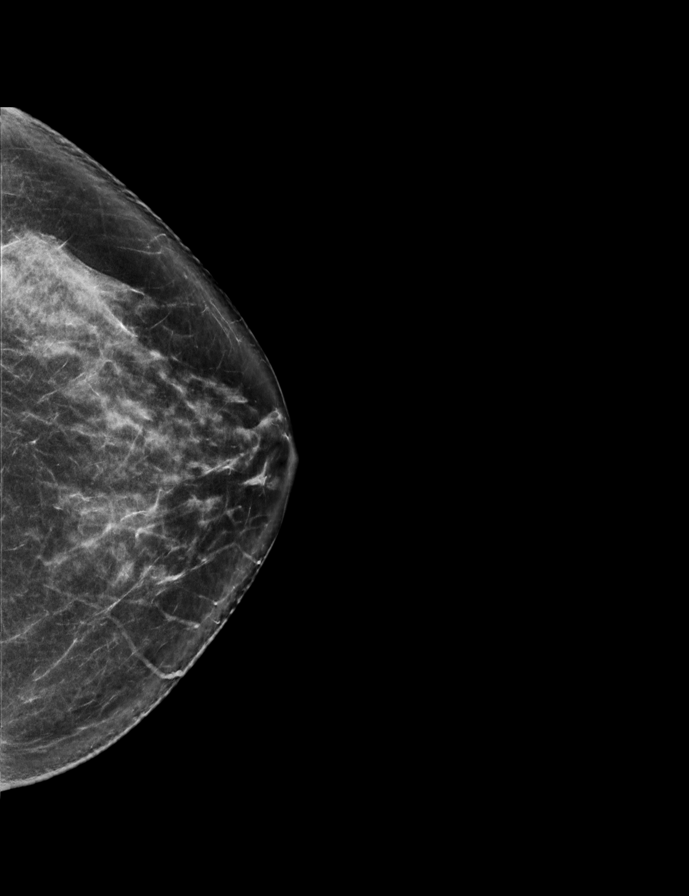

[L MLO synth-2D]
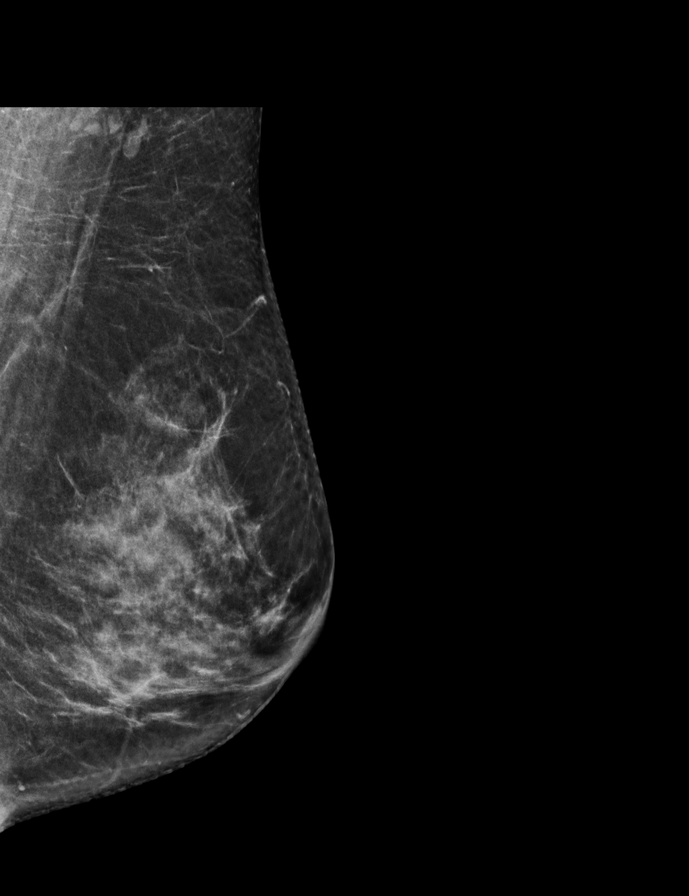

[L MLO tomo · 2 of 68 frames shown]
[frame 22/68]
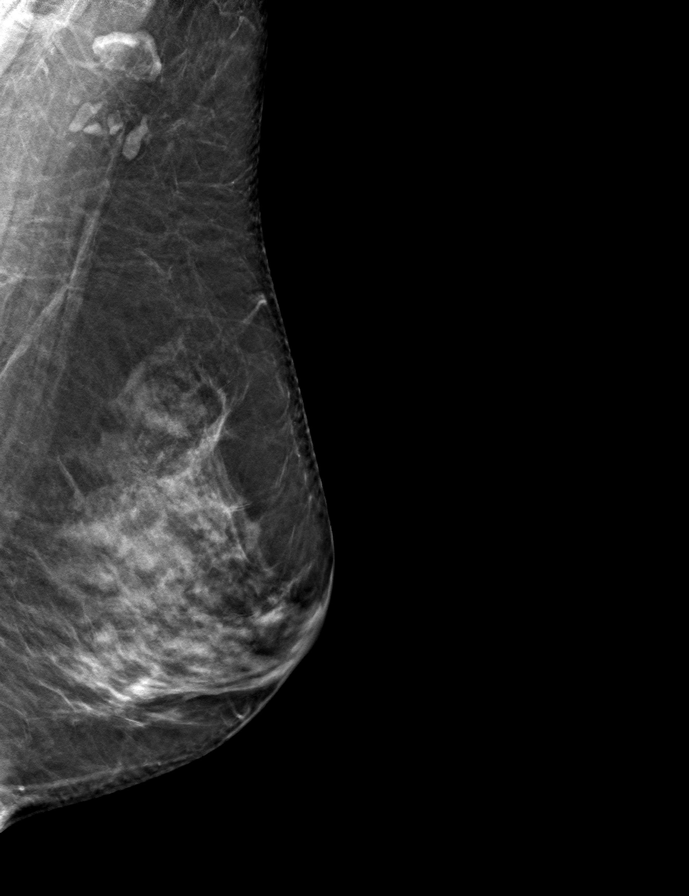
[frame 35/68]
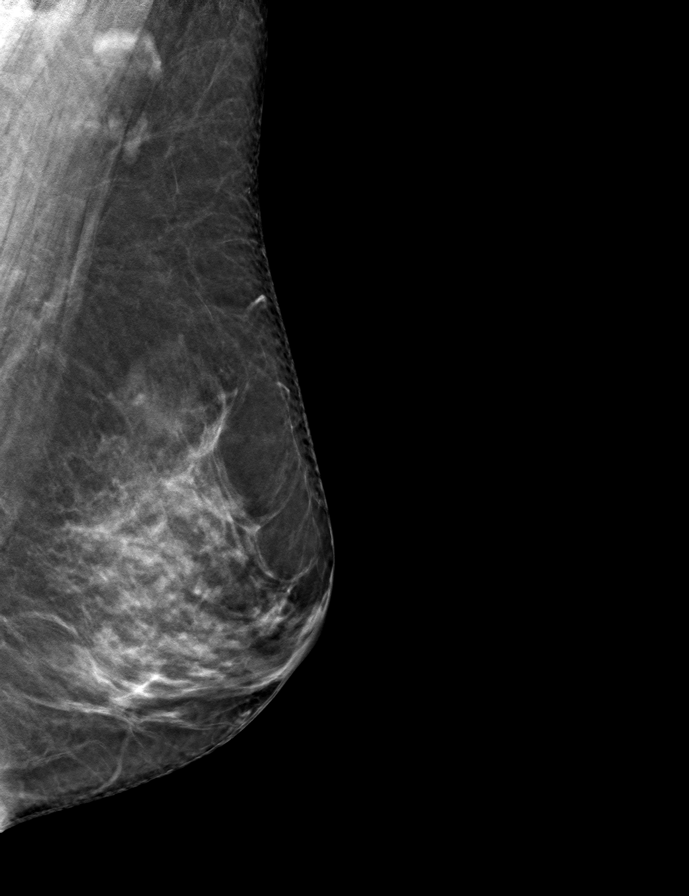

[R MLO tomo · tomo slice 32/63.0]
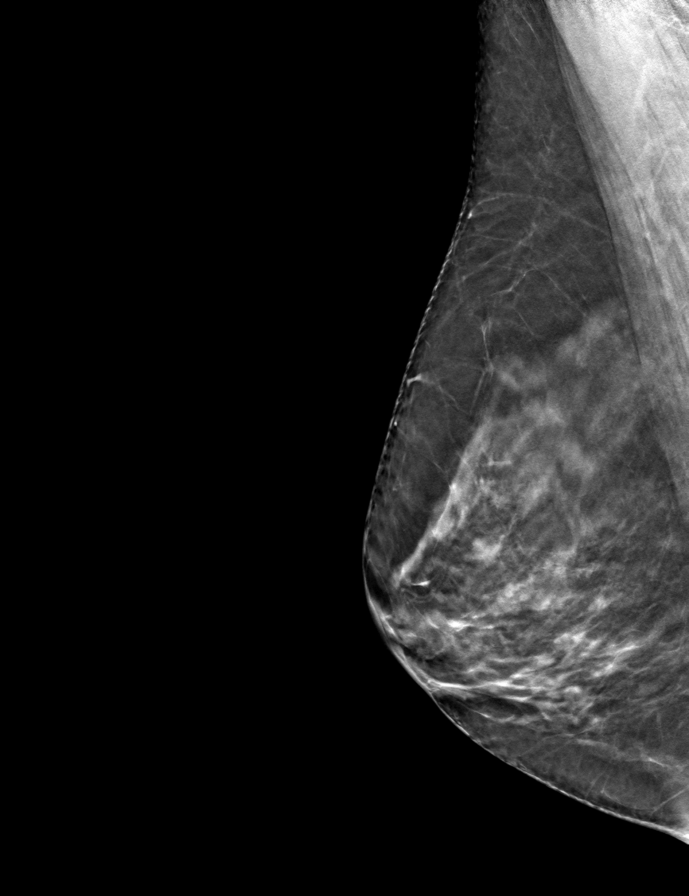

[L CC tomo · tomo slice 35/70.0]
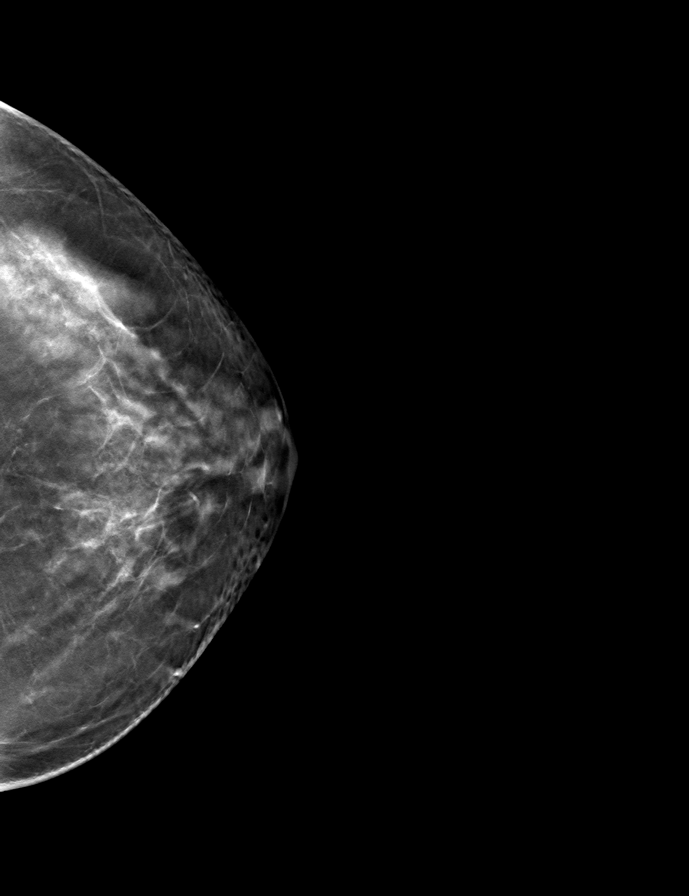

[R CC tomo · tomo slice 33/65.0]
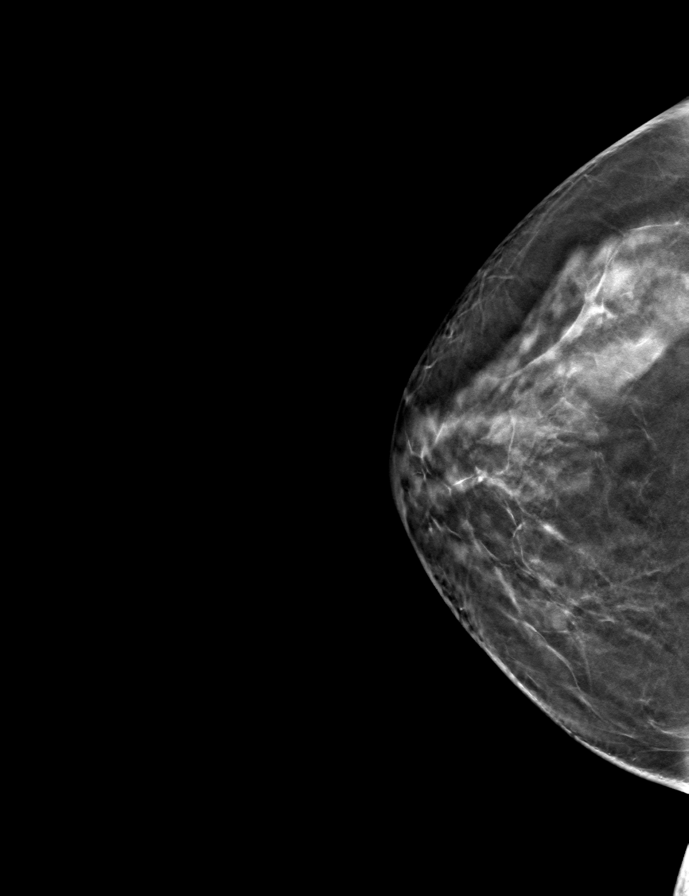

[9 of 24 positions shown; findings below may reference images not displayed]

ACR Breast Density Category c: The breast tissue is heterogeneously
dense, which may obscure small masses.
FINDINGS: There are no findings suspicious for malignancy. Images were
processed with CAD.
IMPRESSION: No mammographic evidence of malignancy. A result letter of this
screening mammogram will be mailed directly to the patient.

RECOMMENDATION:
Screening mammogram in one year. (Code:FT-U-LHB)

BI-RADS CATEGORY  1: Negative.

## 2018-12-28 DIAGNOSIS — Z9889 Other specified postprocedural states: Secondary | ICD-10-CM | POA: Diagnosis not present

## 2018-12-28 DIAGNOSIS — H524 Presbyopia: Secondary | ICD-10-CM | POA: Diagnosis not present

## 2018-12-28 DIAGNOSIS — H2513 Age-related nuclear cataract, bilateral: Secondary | ICD-10-CM | POA: Diagnosis not present

## 2018-12-29 ENCOUNTER — Other Ambulatory Visit: Payer: Self-pay | Admitting: Internal Medicine

## 2018-12-29 DIAGNOSIS — Z1231 Encounter for screening mammogram for malignant neoplasm of breast: Secondary | ICD-10-CM

## 2018-12-30 ENCOUNTER — Ambulatory Visit (INDEPENDENT_AMBULATORY_CARE_PROVIDER_SITE_OTHER): Payer: 59 | Admitting: Allergy

## 2018-12-30 ENCOUNTER — Encounter: Payer: Self-pay | Admitting: Allergy

## 2018-12-30 ENCOUNTER — Telehealth: Payer: Self-pay | Admitting: *Deleted

## 2018-12-30 ENCOUNTER — Other Ambulatory Visit: Payer: Self-pay

## 2018-12-30 VITALS — BP 104/60 | HR 72 | Temp 97.9°F | Resp 14 | Ht 63.0 in | Wt 125.6 lb

## 2018-12-30 DIAGNOSIS — J3089 Other allergic rhinitis: Secondary | ICD-10-CM

## 2018-12-30 MED ORDER — AZELASTINE HCL 0.1 % NA SOLN: 2.0000 | Freq: Two times a day (BID) | NASAL | 5 refills | Status: DC

## 2018-12-30 MED FILL — AZELASTINE HCL 137 MCG SPRY: 0.1 | 25 days supply | Qty: 30 | Fill #0

## 2018-12-30 NOTE — Telephone Encounter (Signed)
Medical Records Release form has been faxed to [facility] on [address] requesting all medical records and allergy injection prescriptions.

## 2018-12-30 NOTE — Progress Notes (Signed)
New Patient Note  RE: Valerie [name] MRN: [id number] DOB: 10-29-1965 Date of Office Visit: 12/30/2018  Referring provider: Madelin Headings, MD Primary care provider: Madelin Headings, MD  Chief Complaint: Allergy shots  History of present illness: Valerie [name] is a 53 y.o. female presenting today for consultation for allergic rhinitis previously on allergy shots.  History obtained by Dr. Mikey Bussing, internal medicine resident.   Patient states that she has been on allergy shots for the past 10 years through [facility].  She stopped in January due to a busy schedule.  She states that new vials were made in January which were reduced down from 3 vials to 2 vials.  She states insurance was a factor in the reduction in the number of vials she received..  Her previous schedule was once weekly.  For the past few months she has noticed her symptoms worsening since not been on the allergy shots.  Due to location she states it would be more convenient for her to establish care [facility] [place] in order to obtain her allergy shots.   She has been having allergy symptoms of runny nose, nasal congestion, and sinus fullness for over 20 years. She states she is allergic to tree pollen, grass pollen, dust mites and cats. She currently takes zyrtec with some benefit.  She also uses benadryl and sudafed as needed.  She does not use a steroid nasal spray as she is worried about side effects.  She has not tried a nasal spray antihistamine.    She denies history of urticaria, asthma or eczema.   Environmental history:  She has carpet in the bedroom, gas heating, central cooling.  She has 1 cat that stays in the house.  She has no roaches and does not use dust mite free covers on pillows or bed.  She does not smoke.  She reports having a job and hobbies that places her in exposure to fumes, chemicals or dust.  Review of systems: Review of Systems  Constitutional: Negative  for chills and fever.  HENT: Positive for congestion. Negative for ear discharge, sinus pain and sore throat.   Eyes: Negative for pain, discharge and redness.  Respiratory: Negative for cough, shortness of breath and wheezing.   Cardiovascular: Negative.   Gastrointestinal: Negative.   Musculoskeletal: Negative for joint pain.  Skin: Negative for rash.  Neurological: Negative for headaches.    All other systems negative unless noted above in HPI  Past medical history: Past Medical History:  Diagnosis Date  . Allergy    cats  . Anemia    NOS  . 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 GERD (gastroesophageal reflux disease)   . HSV infection    labialis  . RECTAL BLEEDING 07/10/2007   Qualifier: Diagnosis of  By: Fabian Sharp MD, Neta Mends 2008 colonoscopy [facility].     Past surgical history: Past Surgical History:  Procedure Laterality Date  . CYST REMOVAL HAND Right   . DILITATION & CURRETTAGE/HYSTROSCOPY WITH VERSAPOINT RESECTION  08/27/2012   Procedure: DILATATION & CURETTAGE/HYSTEROSCOPY WITH VERSAPOINT RESECTION;  Surgeon: Genia Del, MD;  Location: [facility];  Service: Gynecology;;  . LASIK  2009   mono vision  . TRIGGER FINGER RELEASE Right 06/02/2017   Procedure: RELEASE TRIGGER FINGER/A-1 PULLEY RIGHT RING TRIGGER RELEASE;  Surgeon: Betha Loa, MD;  Location: [facility];  Service: Orthopedics;  Laterality: Right;    Family  history:  Family History  Problem Relation Age of Onset  . Diabetes Father   . Hyperlipidemia Father   . Heart disease Father   . Stroke Father   . Allergic rhinitis Mother   . Colon polyps Brother        elev TG  . Allergic rhinitis Brother   . Alcohol abuse Other   . Breast cancer Maternal Aunt        diagnosed in her 650's  . Stroke Maternal Grandmother   . Diabetes Maternal Grandmother   . Heart disease Maternal Grandmother   . Diabetes Maternal Grandfather   . Hyperlipidemia Brother        elevated  triglycerides  . Liver disease Maternal Uncle        alcohol related  . Colon cancer Neg Hx     Social history: See HPI.  She is a Advice workerphysician assistant.  She denies a smoking history.    Medication List: Allergies as of 12/30/2018      Reactions   Augmentin [amoxicillin-pot Clavulanate] Diarrhea   Ceftin [cefuroxime Axetil] Other (See Comments)   Severe yeast infection   Nsaids Nausea Only, Other (See Comments)   Abdominal pain / severe reflux/chest pain      Medication List       Accurate as of Dec 30, 2018 11:14 AM. If you have any questions, ask your nurse or doctor.        cetirizine 10 MG tablet Commonly known as:  ZYRTEC Take 10 mg by mouth daily as needed for allergies.   cholecalciferol 1000 units tablet Commonly known as:  VITAMIN D Take 1,000 Units by mouth daily.   diphenhydrAMINE 50 MG capsule Commonly known as:  BENADRYL Take 50 mg by mouth every 6 (six) hours as needed.   Fish Oil 1200 MG Caps Take 1,200 mg by mouth daily.   multivitamin with minerals Tabs tablet Take 1 tablet by mouth daily.   multivitamin-lutein Caps capsule Take 1 capsule by mouth daily.   TURMERIC PO Take by mouth.   valACYclovir 1000 MG tablet Commonly known as:  Valtrex Take 2 TABLETS BY MOUTH AND REPEATIN 12 HOURS OR AS DIRECTED       Known medication allergies: Allergies  Allergen Reactions  . Augmentin [Amoxicillin-Pot Clavulanate] Diarrhea  . Ceftin [Cefuroxime Axetil] Other (See Comments)    Severe yeast infection  . Nsaids Nausea Only and Other (See Comments)    Abdominal pain / severe reflux/chest pain     Physical examination: Blood pressure 104/60, pulse 72, temperature 97.9 F (36.6 C), temperature source Oral, resp. rate 14, height 5\' 3"  (1.6 m), weight 125 lb 9.6 oz (57 kg), last menstrual period 11/28/2014, SpO2 97 %.  General: Alert, interactive, in no acute distress. HEENT: TMs pearly gray, turbinates mildly edematous without discharge,  post-pharynx unremarkable. Neck: Supple without lymphadenopathy. Lungs: Clear to auscultation without wheezing, rhonchi or rales. {no increased work of breathing. CV: Normal S1, S2 without murmurs. Abdomen: Nondistended, nontender. Skin: Warm and dry, without lesions or rashes. Extremities:  No clubbing, cyanosis or edema. Neuro:   Grossly intact.  Diagnositics/Labs: Unable to perform testing due to recent antihistamine use.    Assessment and plan:   Allergic rhinitis  - will obtain environmental allergy panel to determine what you may still be allergic to.  The results will be used to formulate your new allergy shot vials.   - allergen immunotherapy (allergy shots) discussed today including protocol, benefits and risk.  Will prescribe  an epinephrine device (Epipen or AuviQ) to bring on days of your injections.  Emergency action plan also provided.   - we will [facility] including your previous testing.  If you can get your current vials you can bring to our office and we can resume shots if the vials are appropriate  - continue daily antihistamine Zyrtec 10mg  or Xyzal 5mg    - will start nasal antihistamine, Astelin 1-2 sprays twice a day to help with nasal drainage as needed   Follow-up 3-6 months or sooner if needed   I appreciate the opportunity to take part in Rana's care. Please do not hesitate to contact me with questions.  Sincerely,   Margo Aye, MD Allergy/Immunology [facility]

## 2018-12-30 NOTE — Patient Instructions (Addendum)
Allergies   - will obtain environmental allergy panel to determine what you may still be allergic to.  The results will be used to formulate your new allergy shot vials.   - allergen immunotherapy (allergy shots) discussed today including protocol, benefits and risk.  Will prescribe an epinephrine device (Epipen or AuviQ) to bring on days of your injections.  Emergency action plan also provided.   - we will request your records from [facility] including your previous testing.  If you can get your current vials you can bring to our office and we can resume shots if the vials are appropriate  - continue daily antihistamine Zyrtec 10mg  or Xyzal 5mg    - will start nasal antihistamine, Astelin 1-2 sprays twice a day to help with nasal drainage as needed   Follow-up 3-6 months or sooner if needed

## 2019-01-01 ENCOUNTER — Telehealth: Payer: Self-pay | Admitting: Internal Medicine

## 2019-01-01 LAB — ALLERGENS W/TOTAL IGE AREA 2
Alternaria Alternata IgE: <0.1 kU/L
Aspergillus Fumigatus IgE: <0.1 kU/L
Bermuda Grass IgE: 7.23 kU/L — AB
Cat Dander IgE: 10.2 kU/L — AB
Cedar, Mountain IgE: 0.24 kU/L — AB
Cladosporium Herbarum IgE: <0.1 kU/L
Cockroach, German IgE: <0.1 kU/L
Common Silver Birch IgE: 10.4 kU/L — AB
Cottonwood IgE: 0.1 kU/L — AB
D Farinae IgE: <0.1 kU/L
D Pteronyssinus IgE: <0.1 kU/L
Dog Dander IgE: 0.99 kU/L — AB
Elm, American IgE: 0.3 kU/L — AB
IgE (Immunoglobulin E), Serum: 147 IU/mL (ref 6–495)
Johnson Grass IgE: 6.79 kU/L — AB
Maple/Box Elder IgE: 0.3 kU/L — AB
Mouse Urine IgE: <0.1 kU/L
Oak, White IgE: 2.39 kU/L — AB
Pecan, Hickory IgE: 3.85 kU/L — AB
Penicillium Chrysogen IgE: <0.1 kU/L
Pigweed, Rough IgE: 0.23 kU/L — AB
Ragweed, Short IgE: 2.75 kU/L — AB
Sheep Sorrel IgE Qn: 0.27 kU/L — AB
Timothy Grass IgE: 22.7 kU/L — AB
White Mulberry IgE: 0.11 kU/L — AB

## 2019-01-01 MED ORDER — VALACYCLOVIR HCL 1 G PO TABS: ORAL_TABLET | ORAL | 4 refills | Status: DC

## 2019-01-01 MED FILL — valACYclovir HCL 1 GM TABS: 1 | 8 days supply | Qty: 30 | Fill #0

## 2019-01-01 NOTE — Telephone Encounter (Signed)
Last filled 05/23/17 Last OV 09/02/2018  Ok to fill?

## 2019-01-01 NOTE — Telephone Encounter (Signed)
Copied from CRM [id number]. Topic: Quick Communication - Rx Refill/Question >> Jan 01, 2019  2:57 PM Tamela Oddi wrote: Medication: valACYclovir (VALTREX) 1000 MG tablet  Patient called to request a refill for the above medication  Preferred Pharmacy (with phone number or street name): [facility] - [place], [place] - [address] [place] [phone] (Phone) [phone] (Fax)

## 2019-01-08 ENCOUNTER — Encounter: Payer: 59 | Admitting: Obstetrics & Gynecology

## 2019-01-13 ENCOUNTER — Telehealth: Payer: Self-pay | Admitting: Allergy

## 2019-01-13 NOTE — Telephone Encounter (Signed)
Patient stopped in to fill out paperwork to start injections. She would like to know if she needs to get her old vials from her allergist at [facility].

## 2019-01-14 ENCOUNTER — Other Ambulatory Visit: Payer: Self-pay | Admitting: *Deleted

## 2019-01-14 MED ORDER — EPINEPHRINE 0.3 MG/0.3ML IJ SOAJ: 0.3000 mg | Freq: Once | INTRAMUSCULAR | 2 refills | Status: AC

## 2019-01-14 MED FILL — EPINEPHRINE 0.3 MG AUTO-INJ: 0.3 | 30 days supply | Qty: 2 | Fill #0

## 2019-01-14 NOTE — Telephone Encounter (Signed)
No she doesn't.  It will be easiest to start her over with our new vials since she has lost some sensitivities to allergens she previously was allergic to and no longer have IgE for.   Thus her new vials composition will be different than her old vials.    Did she schedule a new start injection appt?

## 2019-01-14 NOTE — Telephone Encounter (Signed)
Called patient and informed, patient does have a scheduled appointment for June 23 to start allergy injections. A new prescription for her Epi-pen will be sent in as well. Patient verbalized understanding.

## 2019-01-14 NOTE — Telephone Encounter (Signed)
Dr. Delorse Lek I am placing the records in your office and the consent in the bulk scanning. Please advise and thank you.

## 2019-01-15 NOTE — Addendum Note (Signed)
Addended by: Lorrin Mais on: 01/15/2019 12:31 PM   Modules accepted: Orders

## 2019-01-18 DIAGNOSIS — J301 Allergic rhinitis due to pollen: Secondary | ICD-10-CM | POA: Diagnosis not present

## 2019-01-18 NOTE — Progress Notes (Signed)
VIALS EXP 01-18-2020 

## 2019-01-24 ENCOUNTER — Inpatient Hospital Stay: Admission: RE | Admit: 2019-01-24 | Payer: 59 | Source: Ambulatory Visit

## 2019-01-24 ENCOUNTER — Ambulatory Visit (INDEPENDENT_AMBULATORY_CARE_PROVIDER_SITE_OTHER)
Admission: RE | Admit: 2019-01-24 | Discharge: 2019-01-24 | Disposition: A | Discharge status: Home / Self Care | Payer: 59 | Source: Ambulatory Visit

## 2019-01-24 DIAGNOSIS — W57XXXA Bitten or stung by nonvenomous insect and other nonvenomous arthropods, initial encounter: Secondary | ICD-10-CM

## 2019-01-24 DIAGNOSIS — S80261A Insect bite (nonvenomous), right knee, initial encounter: Secondary | ICD-10-CM | POA: Diagnosis not present

## 2019-01-24 NOTE — ED Provider Notes (Signed)
Virtual Visit via Video Note:  [name]  initiated request for Telemedicine visit with [facility] team. I connected with [name]  on 01/24/2019 at 1:43 PM  for a synchronized telemedicine visit using a video enabled HIPPA compliant telemedicine application. I verified that I am speaking with Bary Leriche  using two identifiers. Tanzania Hall-Potvin, PA-C  was physically located in a [facility] site and LENEA BYWATER was located at a different location.   The limitations of evaluation and management by telemedicine as well as the availability of in-person appointments were discussed. Patient was informed that she  may incur a bill ( including co-pay) for this virtual visit encounter. [name]  expressed understanding and gave verbal consent to proceed with virtual visit.     History of Present Illness:Valerie [name]  is a 53 y.o. female presenting for concern of bug bite.  Patient states that she was working in her yard last weekend on Sunday and Monday and had several mosquito bites.  Patient states that all but one lesion resolved; there's still one on her right knee with erythema with central clearing.  Patient denies known tick bite, no foreign body in lesion "I didn't see anything to be removed".  No active discharge, tenderness to palpation.  Overall, patient states that lesion seems to be "easing up ".  Patient has baseline of headaches, denies increased frequency or severity or headaches different from her usual.  Patient denies fatigue, decreased appetite, malaise, myalgias, arthralgias, abdominal pain, diarrhea, knee pain or swelling.    Past Medical History:  Diagnosis Date  . Allergy    cats  . Anemia    NOS  . 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 GERD (gastroesophageal reflux disease)   . HSV infection    labialis  . RECTAL BLEEDING 07/10/2007   Qualifier: Diagnosis of  By: Regis Bill MD, Standley Brooking 2008 colonoscopy [facility].      Allergies  Allergen Reactions  . Augmentin [Amoxicillin-Pot Clavulanate] Diarrhea  . Ceftin [Cefuroxime Axetil] Other (See Comments)    Severe yeast infection  . Nsaids Nausea Only and Other (See Comments)    Abdominal pain / severe reflux/chest pain        Observations/Objective: Well, well-nourished female sitting in no acute distress.  Patient able to point camber towards right knee.  1 to 2 cm lesion of circumferential erythema.  Unable to appreciate central clearing.  Assessment and Plan: 53 year old female presenting for a bite on right knee.  Unknown tick exposure, lesion was without foreign body, warmth or discharge.  Possible that this is tick bite, though due to duration since bite and lack of known exposure patient is not a candidate for prophylactic antibiotic.  Patient is asymptomatic, will monitor for symptoms/fever.  Patient verbalized understanding that she is to follow-up in office if this occurs for further evaluation.  Follow Up Instructions: Keep area clean, dry.  Lesions should resolve on its own.  Return to clinic if you develop fever, fatigue, worsening headaches, muscle or joint pain, abdominal pain.   I discussed the assessment and treatment plan with the patient. The patient was provided an opportunity to ask questions and all were answered. The patient agreed with the plan and demonstrated an understanding of the instructions.   The patient was advised to call back or seek an in-person evaluation if the symptoms worsen or if the condition fails to improve as anticipated.  I provided 20 minutes of non-face-to-face time during this encounter.    GrenadaBrittany Hall-Potvin, PA-C  01/24/2019 1:43 PM        Hall-Potvin, GrenadaBrittany, PA-C 01/24/19 1404

## 2019-01-24 NOTE — Discharge Instructions (Addendum)
Clean and dry. May continue use of OTC medications as needed. Return if you develop fever, joint pain, muscle pain, headache, fatigue, abdominal pain, diarrhea.

## 2019-01-27 ENCOUNTER — Other Ambulatory Visit: Payer: Self-pay

## 2019-01-27 ENCOUNTER — Ambulatory Visit: Payer: 59 | Attending: Internal Medicine | Admitting: Physical Therapy

## 2019-01-27 ENCOUNTER — Encounter: Payer: Self-pay | Admitting: Physical Therapy

## 2019-01-27 DIAGNOSIS — M62838 Other muscle spasm: Secondary | ICD-10-CM | POA: Diagnosis not present

## 2019-01-27 DIAGNOSIS — R293 Abnormal posture: Secondary | ICD-10-CM | POA: Insufficient documentation

## 2019-01-27 DIAGNOSIS — M546 Pain in thoracic spine: Secondary | ICD-10-CM | POA: Insufficient documentation

## 2019-01-27 NOTE — Therapy (Signed)
[place], [place], [address] Phone: [phone]   Fax:  [phone]  Physical Therapy Treatment  Patient Details  Name: Valerie [name] MRN: [id number] Date of Birth: 10-16-1965 Referring Provider (PT): Shanon Ace MD    Encounter Date: 01/27/2019  PT End of Session - 01/27/19 1730    Visit Number  8    Number of Visits  20    Date for PT Re-Evaluation  03/10/19    Authorization Type  [facility]     PT Start Time  3143    PT Stop Time  1721   10 min for hot pack as well as time spent dry needling not included in direct minutes   PT Time Calculation (min)  66 min    Activity Tolerance  Patient tolerated treatment well    Behavior During Therapy  [facility] for tasks assessed/performed       Past Medical History:  Diagnosis Date  . Allergy    cats  . Anemia    NOS  . 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 GERD (gastroesophageal reflux disease)   . HSV infection    labialis  . RECTAL BLEEDING 07/10/2007   Qualifier: Diagnosis of  By: Regis Bill MD, Standley Brooking 2008 colonoscopy [facility].     Past Surgical History:  Procedure Laterality Date  . CYST REMOVAL HAND Right   . DILITATION & CURRETTAGE/HYSTROSCOPY WITH VERSAPOINT RESECTION  08/27/2012   Procedure: DILATATION & CURETTAGE/HYSTEROSCOPY WITH VERSAPOINT RESECTION;  Surgeon: Princess Bruins, MD;  Location: [facility];  Service: Gynecology;;  . LASIK  2009   mono vision  . TRIGGER FINGER RELEASE Right 06/02/2017   Procedure: RELEASE TRIGGER FINGER/A-1 PULLEY RIGHT RING TRIGGER RELEASE;  Surgeon: Leanora Cover, MD;  Location: [facility];  Service: Orthopedics;  Laterality: Right;    There were no vitals filed for this visit.  Subjective Assessment - 01/27/19 1731    Subjective  Pt. returns, not seen for therapy since 11/03/18 due to Covid outbreak/impact on clinic schedule and ability to attend. She reports exacerbation of previous pain with  work duties today most notably pain in left>right upper trapezius, mid-thoracic region and right lateral luower lumbar/quadratus region. Pain worse with sitting at work. Limited recent HEP performance.    Pertinent History  cervical spine pain; Low back pain     Patient Stated Goals  Less pain, start exercising again     Currently in Pain?  Yes    Pain Score  5     Pain Location  Thoracic    Pain Orientation  Left    Pain Descriptors / Indicators  Burning    Pain Type  Chronic pain    Pain Radiating Towards  left>right upper trapezius, right lateral lower lumbar region    Pain Onset  More than a month ago    Pain Frequency  Constant    Aggravating Factors   stting at work    Pain Relieving Factors  rest, heat    Effect of Pain on Daily Activities  limits positional tolerance for work duties         [facility] PT Assessment - 01/27/19 0001      Observation/Other Assessments   Focus on Therapeutic Outcomes (FOTO)   unable to assess FOTO due to technical difficulties-plan check next visit      AROM   AROM Assessment Site  Shoulder;Cervical    Right/Left Shoulder  Right;Left  Right Shoulder Flexion  160 Degrees    Right Shoulder Internal Rotation  --   reach to T11   Right Shoulder External Rotation  90 Degrees    Left Shoulder Flexion  170 Degrees    Left Shoulder Internal Rotation  --   reach to T10   Left Shoulder External Rotation  90 Degrees    Cervical Flexion  40    Cervical Extension  35    Cervical - Right Side Bend  26    Cervical - Left Side Bend  38    Cervical - Right Rotation  70    Cervical - Left Rotation  55      Strength   Overall Strength Comments  Bilat. UE grossly 5/5                   [facility] Adult PT Treatment/Exercise - 01/27/19 0001      Lumbar Exercises: Stretches   Other Lumbar Stretch Exercise  manual Right QL stretch 3 x 30 sec      Lumbar Exercises: Quadruped   Other Quadruped Lumbar Exercises  cat/cow x 15 reps      Moist Heat  Therapy   Number Minutes Moist Heat  10 Minutes    Moist Heat Location  --   cervical and thoracic in sitting     Manual Therapy   Joint Mobilization  thoracic PAs grade I-IV    Soft tissue mobilization  left upper trapezius in sitting, thoracic paraspinals and right QL in prone    Myofascial Release  thoracic paraspinals      Neck Exercises: Stretches   Other Neck Stretches  HEP review upper trap stretches, also instructed/reviewed thoracic ROM with cat/cow vs. seated extension stretch over chair/roll       Trigger Point Dry Needling - 01/27/19 0001    Consent Given?  Yes    Muscles Treated Head and Neck  Upper trapezius    Muscles Treated Back/Hip  Erector spinae;Quadratus lumborum    Dry Needling Comments  30 gauge 40 mm needles used excepting QL used 30 gauge 60 mm, right QL needled otherwise needling bilat., erector spinae T T7-9 region    Upper Trapezius Response  Twitch reponse elicited           PT Education - 01/27/19 1741    Education Details  POC, HEP    Person(s) Educated  Patient    Methods  Demonstration;Explanation    Comprehension  Verbalized understanding;Returned demonstration       PT Short Term Goals - 01/27/19 1745      PT SHORT TERM GOAL #1   Title  pt will be I with inital HEP     Baseline  updates ongoing    Time  3    Period  Weeks    Status  On-going    Target Date  02/17/19      PT SHORT TERM GOAL #2   Title  Patient will report decreased tenderness to palpation     Baseline  Improving from baseline but still with tenderness upper traps and thoracic region    Time  3    Period  Weeks    Status  On-going    Target Date  02/17/19      PT SHORT TERM GOAL #3   Title  Patient will demosntrate full left shoulder motion     Baseline  met/able    Time  3    Period  Weeks  Status  Achieved        PT Long Term Goals - 01/27/19 1746      PT LONG TERM GOAL #1   Title  Patient will sit at work for an entire day without self report of  increased pain     Baseline  still has pain increase, today 5/10    Time  6    Period  Weeks    Status  On-going    Target Date  03/10/19      PT LONG TERM GOAL #2   Title  Patient will demosntrate a 19% limitation on FOTO     Baseline  previously 35% limited, unable to assess today due to technical difficulties    Time  6    Period  Weeks    Status  On-going    Target Date  03/10/19      PT LONG TERM GOAL #3   Title  Patient will return to the gym without increased pain     Baseline  gyms closed due to Covid, d/c goal until gyms reopen    Time  6    Period  Weeks    Status  Deferred            Plan - 01/27/19 1741    Clinical Impression Statement  Pt. returns with exacerbation of previous complaints of thoracic and upper trapezius pain consistent with myofascial etiology with contributing postural factors and thoracic hypomobility. Status impacted by inability recently to attend therapy due to Covid outbreak impact on clinic status. Pt. had also helped manage symptoms with regular massage but has been unable to receive this due to restrictions. Pt. would benefit from resumed/continued therapy for further progress to address functional limitations.    Stability/Clinical Decision Making  Stable/Uncomplicated    Clinical Decision Making  Low    Rehab Potential  Good    PT Frequency  2x / week   1-2x/week   PT Duration  6 weeks    PT Treatment/Interventions  ADLs/Self Care Home Management;Iontophoresis 58m/ml Dexamethasone;Cryotherapy;Moist Heat;Therapeutic activities;Therapeutic exercise;Neuromuscular re-education;Cognitive remediation;Patient/family education;Manual techniques;Manual lymph drainage;Spinal Manipulations;Dry needling;Taping;Ultrasound    PT Next Visit Plan  check FOTO, further dry needling as needed, STM, thoracic mobs, stretches and postural/periscapular strengthening    PT Home Exercise Plan  rhomboid stretch; sink stretch; scap retraction; shoulder extension;  bilateral ER; bilateral horizontal abduction, upper trap stretch, thoracic extension ROM    Consulted and Agree with Plan of Care  Patient       Patient will benefit from skilled therapeutic intervention in order to improve the following deficits and impairments:  Pain, Postural dysfunction, Decreased activity tolerance, Decreased endurance, Decreased range of motion, Decreased strength  Visit Diagnosis: Pain in thoracic spine  Other muscle spasm  Abnormal posture     Problem List Patient Active Problem List   Diagnosis Date Noted  . Trigger point of left shoulder region 09/21/2015  . Impingement syndrome of right shoulder 03/08/2015  . Trigger point of right shoulder region 03/08/2015  . Low back pain 03/16/2014  . Wrist pain, right 02/09/2014  . Nonallopathic lesion of cervical region 11/03/2013  . Nonallopathic lesion of thoracic region 11/03/2013  . Nonallopathic lesion-rib cage 11/03/2013  . Nonallopathic lesion of lumbosacral region 11/03/2013  . Nonallopathic lesion of sacral region 11/03/2013  . Imbalance 10/27/2012  . Neck pain 10/27/2012  . Headache(784.0) 07/15/2012  . Abdominal cramps 02/25/2012  . Abnormal stools 02/25/2012  . Delayed menses 02/25/2012  . Visit for  preventive health examination 12/03/2011  . Abdominal bloating 12/03/2011  . DYSESTHESIA 09/20/2009  . ARREST OF BONE DEVELOPMENT OR GROWTH 07/24/2007  . DERMATOPHYTOSIS OF NAIL 07/10/2007  . ALLERGIC RHINITIS 07/10/2007  . GERD 07/10/2007  . BLOOD IN STOOL 07/10/2007  . HERPES LABIALIS, HX OF 07/10/2007    Beaulah Dinning, PT, DPT 01/27/19 5:48 PM  [facility] [address] [place], [place], [address] Phone: [phone]   Fax:  [phone]  Name: TYMESHA DITMORE MRN: [id number] Date of Birth: 1966/04/10

## 2019-02-08 ENCOUNTER — Ambulatory Visit: Payer: 59 | Admitting: Physical Therapy

## 2019-02-09 ENCOUNTER — Ambulatory Visit (INDEPENDENT_AMBULATORY_CARE_PROVIDER_SITE_OTHER): Payer: 59 | Admitting: *Deleted

## 2019-02-09 ENCOUNTER — Ambulatory Visit: Payer: 59 | Admitting: Physical Therapy

## 2019-02-09 ENCOUNTER — Other Ambulatory Visit: Payer: Self-pay

## 2019-02-09 DIAGNOSIS — J309 Allergic rhinitis, unspecified: Secondary | ICD-10-CM

## 2019-02-09 NOTE — Progress Notes (Signed)
Immunotherapy   Patient Details  Name: LITSY EPTING MRN: [id number] Date of Birth: 1966-01-13  02/09/2019  Ivan Anchors Menge started injections for  Pollen-Pet Following schedule: B  Frequency:1 time per week Epi-Pen: Yes Consent signed and patient instructions given. Patient received .19mL of Pollen-Pet in the RUA today. Patient waited 30 minutes and did not experience any issues.    Ashleigh Fernandez-Vernon 02/09/2019, 11:15 AM

## 2019-02-18 ENCOUNTER — Ambulatory Visit
Admission: RE | Admit: 2019-02-18 | Discharge: 2019-02-18 | Disposition: A | Discharge status: Home / Self Care | Payer: 59 | Source: Ambulatory Visit | Attending: Internal Medicine | Admitting: Internal Medicine

## 2019-02-18 ENCOUNTER — Encounter: Payer: Self-pay | Admitting: Physical Therapy

## 2019-02-18 ENCOUNTER — Other Ambulatory Visit: Payer: Self-pay

## 2019-02-18 ENCOUNTER — Ambulatory Visit: Payer: 59 | Attending: Internal Medicine | Admitting: Physical Therapy

## 2019-02-18 DIAGNOSIS — R293 Abnormal posture: Secondary | ICD-10-CM | POA: Insufficient documentation

## 2019-02-18 DIAGNOSIS — Z1231 Encounter for screening mammogram for malignant neoplasm of breast: Secondary | ICD-10-CM | POA: Diagnosis not present

## 2019-02-18 DIAGNOSIS — M62838 Other muscle spasm: Secondary | ICD-10-CM | POA: Diagnosis not present

## 2019-02-18 DIAGNOSIS — M546 Pain in thoracic spine: Secondary | ICD-10-CM | POA: Diagnosis not present

## 2019-02-18 NOTE — Therapy (Signed)
[place], [place], [address] Phone: [phone]   Fax:  [phone]  Physical Therapy Treatment  Patient Details  Name: Valerie [name] MRN: [id number] Date of Birth: 1966-05-29 Referring Provider (PT): Shanon Ace MD    Encounter Date: 02/18/2019  PT End of Session - 02/18/19 1654    Visit Number  9    Number of Visits  20    Date for PT Re-Evaluation  03/10/19    Authorization Type  [facility]     PT Start Time  1558    PT Stop Time  1657    PT Time Calculation (min)  59 min    Activity Tolerance  Patient tolerated treatment well    Behavior During Therapy  [facility] for tasks assessed/performed       Past Medical History:  Diagnosis Date  . Allergy    cats  . Anemia    NOS  . 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 GERD (gastroesophageal reflux disease)   . HSV infection    labialis  . RECTAL BLEEDING 07/10/2007   Qualifier: Diagnosis of  By: Regis Bill MD, Standley Brooking 2008 colonoscopy [facility].     Past Surgical History:  Procedure Laterality Date  . CYST REMOVAL HAND Right   . DILITATION & CURRETTAGE/HYSTROSCOPY WITH VERSAPOINT RESECTION  08/27/2012   Procedure: DILATATION & CURETTAGE/HYSTEROSCOPY WITH VERSAPOINT RESECTION;  Surgeon: Princess Bruins, MD;  Location: [facility];  Service: Gynecology;;  . LASIK  2009   mono vision  . TRIGGER FINGER RELEASE Right 06/02/2017   Procedure: RELEASE TRIGGER FINGER/A-1 PULLEY RIGHT RING TRIGGER RELEASE;  Surgeon: Leanora Cover, MD;  Location: [facility];  Service: Orthopedics;  Laterality: Right;    There were no vitals filed for this visit.  Subjective Assessment - 02/18/19 1649    Subjective  Pt. returns for first session since 01/27/19-reports last session helped but working more lately with exacerbation of symptoms for sitting, computer work. Symptoms most notable in left upper trapezius region.    Currently in Pain?  Yes    Pain Score  6      Pain Location  Thoracic    Pain Orientation  Left    Pain Descriptors / Indicators  Burning    Pain Type  Chronic pain    Pain Radiating Towards  upper trapezius    Pain Onset  More than a month ago    Aggravating Factors   sitting at work    Pain Relieving Factors  rest, heat    Effect of Pain on Daily Activities  limits positional tolerance for work duties                       [employer] Adult PT Treatment/Exercise - 02/18/19 0001      Lumbar Exercises: Stretches   Other Lumbar Stretch Exercise  manual Right QL stretch 3 x 30 sec      Moist Heat Therapy   Number Minutes Moist Heat  10 Minutes    Moist Heat Location  Cervical      Manual Therapy   Soft tissue mobilization  left and right upper trapezius in sitting, right lumbar paraspinals and right QL in prone      Neck Exercises: Stretches   Upper Trapezius Stretch  Right;Left;3 reps;30 seconds    Other Neck Stretches  manual left levator stretch 3x30 sec       Trigger Point  Dry Needling - 02/18/19 0001    Consent Given?  Yes    Muscles Treated Head and Neck  Upper trapezius    Muscles Treated Back/Hip  Erector spinae;Quadratus lumborum    Dry Needling Comments  bilat. upper trapezius in sidelying ea. side 30 gauge 40 mm needles, right longissimus L4-5 region with 32 gauge 60 mm needle, R QL in sidelying with 30 gauge 75 mm needles    Upper Trapezius Response  Twitch reponse elicited           PT Education - 02/18/19 1654    Education Details  POC    Person(s) Educated  Patient    Methods  Explanation    Comprehension  Verbalized understanding       PT Short Term Goals - 01/27/19 1745      PT SHORT TERM GOAL #1   Title  pt will be I with inital HEP     Baseline  updates ongoing    Time  3    Period  Weeks    Status  On-going    Target Date  02/17/19      PT SHORT TERM GOAL #2   Title  Patient will report decreased tenderness to palpation     Baseline  Improving from baseline but still  with tenderness upper traps and thoracic region    Time  3    Period  Weeks    Status  On-going    Target Date  02/17/19      PT SHORT TERM GOAL #3   Title  Patient will demosntrate full left shoulder motion     Baseline  met/able    Time  3    Period  Weeks    Status  Achieved        PT Long Term Goals - 01/27/19 1746      PT LONG TERM GOAL #1   Title  Patient will sit at work for an entire day without self report of increased pain     Baseline  still has pain increase, today 5/10    Time  6    Period  Weeks    Status  On-going    Target Date  03/10/19      PT LONG TERM GOAL #2   Title  Patient will demosntrate a 19% limitation on FOTO     Baseline  previously 35% limited, unable to assess today due to technical difficulties    Time  6    Period  Weeks    Status  On-going    Target Date  03/10/19      PT LONG TERM GOAL #3   Title  Patient will return to the gym without increased pain     Baseline  gyms closed due to Covid, d/c goal until gyms reopen    Time  6    Period  Weeks    Status  Deferred            Plan - 02/18/19 1654    Clinical Impression Statement  Good response to last tx. but some setback with time off from tx. and increased recent work activities. Tx. focus addressing myofascial pain and postural tension with good post-tx. effect to improve.    Stability/Clinical Decision Making  Stable/Uncomplicated    Clinical Decision Making  Low    Rehab Potential  Good    PT Frequency  2x / week    PT Duration  6 weeks  PT Treatment/Interventions  ADLs/Self Care Home Management;Iontophoresis 2m/ml Dexamethasone;Cryotherapy;Moist Heat;Therapeutic activities;Therapeutic exercise;Neuromuscular re-education;Cognitive remediation;Patient/family education;Manual techniques;Manual lymph drainage;Spinal Manipulations;Dry needling;Taping;Ultrasound    PT Next Visit Plan  check FOTO, further dry needling as needed, STM, thoracic mobs, stretches and  postural/periscapular strengthening    PT Home Exercise Plan  rhomboid stretch; sink stretch; scap retraction; shoulder extension; bilateral ER; bilateral horizontal abduction, upper trap stretch, thoracic extension ROM    Consulted and Agree with Plan of Care  Patient       Patient will benefit from skilled therapeutic intervention in order to improve the following deficits and impairments:  Pain, Postural dysfunction, Decreased activity tolerance, Decreased endurance, Decreased range of motion, Decreased strength  Visit Diagnosis: 1. Pain in thoracic spine   2. Other muscle spasm   3. Abnormal posture        Problem List Patient Active Problem List   Diagnosis Date Noted  . Trigger point of left shoulder region 09/21/2015  . Impingement syndrome of right shoulder 03/08/2015  . Trigger point of right shoulder region 03/08/2015  . Low back pain 03/16/2014  . Wrist pain, right 02/09/2014  . Nonallopathic lesion of cervical region 11/03/2013  . Nonallopathic lesion of thoracic region 11/03/2013  . Nonallopathic lesion-rib cage 11/03/2013  . Nonallopathic lesion of lumbosacral region 11/03/2013  . Nonallopathic lesion of sacral region 11/03/2013  . Imbalance 10/27/2012  . Neck pain 10/27/2012  . Headache(784.0) 07/15/2012  . Abdominal cramps 02/25/2012  . Abnormal stools 02/25/2012  . Delayed menses 02/25/2012  . Visit for preventive health examination 12/03/2011  . Abdominal bloating 12/03/2011  . DYSESTHESIA 09/20/2009  . ARREST OF BONE DEVELOPMENT OR GROWTH 07/24/2007  . DERMATOPHYTOSIS OF NAIL 07/10/2007  . ALLERGIC RHINITIS 07/10/2007  . GERD 07/10/2007  . BLOOD IN STOOL 07/10/2007  . HERPES LABIALIS, HX OF 07/10/2007    CBeaulah Dinning PT, DPT 02/18/19 4:57 PM  [facility] [place] [address]Phone: [phone]  Fax:  [phone] Name: Valerie RECHTMRN: [id number] of Birth:  [birth year]

## 2019-02-24 ENCOUNTER — Ambulatory Visit (INDEPENDENT_AMBULATORY_CARE_PROVIDER_SITE_OTHER): Payer: 59 | Admitting: *Deleted

## 2019-02-24 ENCOUNTER — Encounter: Payer: Self-pay | Admitting: Physical Therapy

## 2019-02-24 ENCOUNTER — Other Ambulatory Visit: Payer: Self-pay

## 2019-02-24 ENCOUNTER — Ambulatory Visit: Payer: 59 | Admitting: Physical Therapy

## 2019-02-24 DIAGNOSIS — J309 Allergic rhinitis, unspecified: Secondary | ICD-10-CM

## 2019-02-24 DIAGNOSIS — M62838 Other muscle spasm: Secondary | ICD-10-CM

## 2019-02-24 DIAGNOSIS — R293 Abnormal posture: Secondary | ICD-10-CM

## 2019-02-24 DIAGNOSIS — M546 Pain in thoracic spine: Secondary | ICD-10-CM

## 2019-02-24 NOTE — Therapy (Signed)
[place], [place], [address] Phone: [phone]   Fax:  [phone]  Physical Therapy Treatment  Patient Details  Name: Valerie [name] MRN: [id number] Date of Birth: 1966/04/03 Referring Provider (PT): Shanon Ace MD    Encounter Date: 02/24/2019  PT End of Session - 02/24/19 1712    Visit Number  10    Number of Visits  20    Date for PT Re-Evaluation  03/10/19    Authorization Type  [facility]     PT Start Time  4536    PT Stop Time  4680   time spent dry needling not included in direct minutes   PT Time Calculation (min)  59 min    Activity Tolerance  Patient tolerated treatment well    Behavior During Therapy  [facility] for tasks assessed/performed       Past Medical History:  Diagnosis Date  . Allergy    cats  . Anemia    NOS  . 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 GERD (gastroesophageal reflux disease)   . HSV infection    labialis  . RECTAL BLEEDING 07/10/2007   Qualifier: Diagnosis of  By: Regis Bill MD, Standley Brooking 2008 colonoscopy [facility].     Past Surgical History:  Procedure Laterality Date  . CYST REMOVAL HAND Right   . DILITATION & CURRETTAGE/HYSTROSCOPY WITH VERSAPOINT RESECTION  08/27/2012   Procedure: DILATATION & CURETTAGE/HYSTEROSCOPY WITH VERSAPOINT RESECTION;  Surgeon: Princess Bruins, MD;  Location: [facility];  Service: Gynecology;;  . LASIK  2009   mono vision  . TRIGGER FINGER RELEASE Right 06/02/2017   Procedure: RELEASE TRIGGER FINGER/A-1 PULLEY RIGHT RING TRIGGER RELEASE;  Surgeon: Leanora Cover, MD;  [facility];  Service: Orthopedics;  Laterality: Right;    There were no vitals filed for this visit.  Subjective Assessment - 02/24/19 1706    Subjective  "Better than last time." Primary complaint is left>right upper trapezius as well as rhomboid region pain. R LBP improved though still mild symptoms.    Currently in Pain?  Yes    Pain Score  4      Pain Location  Thoracic    Pain Orientation  Left    Pain Descriptors / Indicators  Burning    Pain Type  Chronic pain    Pain Radiating Towards  left>right upper trapezius    Pain Onset  More than a month ago    Pain Frequency  Constant    Aggravating Factors   sitting at work    Pain Relieving Factors  rest, heat    Effect of Pain on Daily Activities  limits positional tolerance for work duties         [facility] PT Assessment - 02/24/19 0001      Observation/Other Assessments   Focus on Therapeutic Outcomes (FOTO)   18% limited                   [facility] Adult PT Treatment/Exercise - 02/24/19 0001      Moist Heat Therapy   Number Minutes Moist Heat  10 Minutes    Moist Heat Location  Cervical      Manual Therapy   Joint Mobilization  Thoracic PAs grade I-IV    Soft tissue mobilization  left>right upper traps and rhomboids      Neck Exercises: Stretches   Upper Trapezius Stretch  Left;3 reps;30 seconds    Other Neck  Stretches  manual left levator stretch 3x30 sec    Other Neck Stretches  left rhomboid stretch 3x30 sec, also performed manual R QL stretch 3x30 sec       Trigger Point Dry Needling - 02/24/19 0001    Consent Given?  Yes    Muscles Treated Head and Neck  Upper trapezius    Muscles Treated Upper Quadrant  Rhomboids    Muscles Treated Back/Hip  Quadratus lumborum    Dry Needling Comments  40 mm 32 gauge needles for upper traps and left rhomboid, rhomboid needled in prone hammerlock with transverse insertion, upper traps needles in sidelying. R QL needles in left sidelying over pillow with 75 mm 30 gauge needle    Upper Trapezius Response  Twitch reponse elicited           PT Education - 02/24/19 1712    Education Details  POC    Person(s) Educated  Patient    Methods  Explanation    Comprehension  Verbalized understanding       PT Short Term Goals - 01/27/19 1745      PT SHORT TERM GOAL #1   Title  pt will be I with inital HEP      Baseline  updates ongoing    Time  3    Period  Weeks    Status  On-going    Target Date  02/17/19      PT SHORT TERM GOAL #2   Title  Patient will report decreased tenderness to palpation     Baseline  Improving from baseline but still with tenderness upper traps and thoracic region    Time  3    Period  Weeks    Status  On-going    Target Date  02/17/19      PT SHORT TERM GOAL #3   Title  Patient will demosntrate full left shoulder motion     Baseline  met/able    Time  3    Period  Weeks    Status  Achieved        PT Long Term Goals - 02/24/19 1715      PT LONG TERM GOAL #1   Title  Patient will sit at work for an entire day without self report of increased pain     Baseline  still has pain increase, today 4/10    Time  6    Period  Weeks    Status  On-going      PT LONG TERM GOAL #2   Title  Patient will demonstrate a 19% limitation on FOTO    Baseline  met, 18%    Time  6    Period  Weeks    Status  Achieved      PT LONG TERM GOAL #3   Title  Patient will return to the gym without increased pain     Baseline  gyms closed due to Covid, d/c goal until gyms reopen    Time  6    Period  Weeks    Status  Deferred            Plan - 02/24/19 1713    Clinical Impression Statement  Improving with functional status as evidenced by change in FOTO score from last assessment of this, also pain improving per subjective report. gradual progress expected given chronicity of symptoms.    Stability/Clinical Decision Making  Stable/Uncomplicated    Clinical Decision Making  Low    Rehab  Potential  Good    PT Frequency  2x / week    PT Duration  6 weeks    PT Treatment/Interventions  ADLs/Self Care Home Management;Iontophoresis 54m/ml Dexamethasone;Cryotherapy;Moist Heat;Therapeutic activities;Therapeutic exercise;Neuromuscular re-education;Cognitive remediation;Patient/family education;Manual techniques;Spinal Manipulations;Dry needling;Taping;Ultrasound    PT Next  Visit Plan  further dry needling as needed, STM, thoracic mobs, stretches and postural/periscapular strengthening    PT Home Exercise Plan  rhomboid stretch; sink stretch; scap retraction; shoulder extension; bilateral ER; bilateral horizontal abduction, upper trap stretch, thoracic extension ROM    Consulted and Agree with Plan of Care  Patient       Patient will benefit from skilled therapeutic intervention in order to improve the following deficits and impairments:  Pain, Postural dysfunction, Decreased activity tolerance, Decreased endurance, Decreased range of motion, Decreased strength  Visit Diagnosis: 1. Pain in thoracic spine   2. Other muscle spasm   3. Abnormal posture        Problem List Patient Active Problem List   Diagnosis Date Noted  . Trigger point of left shoulder region 09/21/2015  . Impingement syndrome of right shoulder 03/08/2015  . Trigger point of right shoulder region 03/08/2015  . Low back pain 03/16/2014  . Wrist pain, right 02/09/2014  . Nonallopathic lesion of cervical region 11/03/2013  . Nonallopathic lesion of thoracic region 11/03/2013  . Nonallopathic lesion-rib cage 11/03/2013  . Nonallopathic lesion of lumbosacral region 11/03/2013  . Nonallopathic lesion of sacral region 11/03/2013  . Imbalance 10/27/2012  . Neck pain 10/27/2012  . Headache(784.0) 07/15/2012  . Abdominal cramps 02/25/2012  . Abnormal stools 02/25/2012  . Delayed menses 02/25/2012  . Visit for preventive health examination 12/03/2011  . Abdominal bloating 12/03/2011  . DYSESTHESIA 09/20/2009  . ARREST OF BONE DEVELOPMENT OR GROWTH 07/24/2007  . DERMATOPHYTOSIS OF NAIL 07/10/2007  . ALLERGIC RHINITIS 07/10/2007  . GERD 07/10/2007  . BLOOD IN STOOL 07/10/2007  . HERPES LABIALIS, HX OF 07/10/2007   CBeaulah Dinning PT, DPT 02/24/19 5:16 PM  [facility] N[place] [address]Phone: [phone]   Fax:  [phone] Name: PANAIAH MCMANNISMRN: [id number] of Birth: [birth year]

## 2019-03-02 ENCOUNTER — Other Ambulatory Visit: Payer: Self-pay

## 2019-03-03 ENCOUNTER — Encounter: Payer: Self-pay | Admitting: Obstetrics & Gynecology

## 2019-03-03 ENCOUNTER — Ambulatory Visit (INDEPENDENT_AMBULATORY_CARE_PROVIDER_SITE_OTHER): Payer: 59 | Admitting: *Deleted

## 2019-03-03 ENCOUNTER — Ambulatory Visit (INDEPENDENT_AMBULATORY_CARE_PROVIDER_SITE_OTHER): Payer: 59 | Admitting: Obstetrics & Gynecology

## 2019-03-03 VITALS — BP 124/82 | Ht 63.0 in | Wt 126.0 lb

## 2019-03-03 DIAGNOSIS — Z01419 Encounter for gynecological examination (general) (routine) without abnormal findings: Secondary | ICD-10-CM

## 2019-03-03 DIAGNOSIS — J309 Allergic rhinitis, unspecified: Secondary | ICD-10-CM | POA: Diagnosis not present

## 2019-03-03 DIAGNOSIS — Z780 Asymptomatic menopausal state: Secondary | ICD-10-CM | POA: Diagnosis not present

## 2019-03-03 NOTE — Progress Notes (Signed)
Valerie [name] Apr 28, 1966 [id number]   History:    54 y.o. G0 Married  RP:  Established patient presenting for annual gyn exam   HPI: Menopause, well on no HRT.  No PMB.  No pelvic pain.  No pain with IC.  Urine/BMs normal.  Breasts normal.  BMI 22.32.  Not exercising as regularly as usual.  Health labs with Fam MD.  Past medical history,surgical history, family history and social history were all reviewed and documented in the EPIC chart.  Gynecologic History Patient's last menstrual period was 11/28/2014. Contraception: post menopausal status Last Pap: 02/2018. Results were: Negative Last mammogram: 02/2019. Results were: Negative Bone Density: Never Colonoscopy: 2017 normal  Obstetric History OB History  Gravida Para Term Preterm AB Living  0 0 0 0 0 0  SAB TAB Ectopic Multiple Live Births  0 0 0 0 0     ROS: A ROS was performed and pertinent positives and negatives are included in the history.  GENERAL: No fevers or chills. HEENT: No change in vision, no earache, sore throat or sinus congestion. NECK: No pain or stiffness. CARDIOVASCULAR: No chest pain or pressure. No palpitations. PULMONARY: No shortness of breath, cough or wheeze. GASTROINTESTINAL: No abdominal pain, nausea, vomiting or diarrhea, melena or bright red blood per rectum. GENITOURINARY: No urinary frequency, urgency, hesitancy or dysuria. MUSCULOSKELETAL: No joint or muscle pain, no back pain, no recent trauma. DERMATOLOGIC: No rash, no itching, no lesions. ENDOCRINE: No polyuria, polydipsia, no heat or cold intolerance. No recent change in weight. HEMATOLOGICAL: No anemia or easy bruising or bleeding. NEUROLOGIC: No headache, seizures, numbness, tingling or weakness. PSYCHIATRIC: No depression, no loss of interest in normal activity or change in sleep pattern.     Exam:   BP 124/82   Ht 5\' 3"  (1.6 m)   Wt 126 lb (57.2 kg)   LMP 11/28/2014   BMI 22.32 kg/m   Body mass index is 22.32 kg/m.  General  appearance : Well developed well nourished female. No acute distress HEENT: Eyes: no retinal hemorrhage or exudates,  Neck supple, trachea midline, no carotid bruits, no thyroidmegaly Lungs: Clear to auscultation, no rhonchi or wheezes, or rib retractions  Heart: Regular rate and rhythm, no murmurs or gallops Breast:Examined in sitting and supine position were symmetrical in appearance, no palpable masses or tenderness,  no skin retraction, no nipple inversion, no nipple discharge, no skin discoloration, no axillary or supraclavicular lymphadenopathy Abdomen: no palpable masses or tenderness, no rebound or guarding Extremities: no edema or skin discoloration or tenderness  Pelvic: Vulva: Normal             Vagina: No gross lesions or discharge  Cervix: No gross lesions or discharge  Uterus  AV, normal size, shape and consistency, non-tender and mobile  Adnexa  Without masses or tenderness  Anus: Normal   Assessment/Plan:  53 y.o. female for annual exam   1. Well female exam with routine gynecological exam Normal gynecologic exam.  Last Pap test July 2019 was negative, no indication to repeat this year.  Breast exam normal.  Screening mammogram July 2020 was negative.  Good body mass index at 22.32.  Increase aerobic activities to 5 times a week and weightlifting every 2 days.  Health labs with family physician.  2. Postmenopause Well on no hormone replacement therapy.  No postmenopausal bleeding.  Vaginal dryness with intercourse, recommend coconut oil.  Vitamin D supplements, calcium intake of 1200 mg daily and regular weightbearing physical activities  recommended.  [name] MD, 4:29 PM 03/03/2019

## 2019-03-03 NOTE — Patient Instructions (Signed)
1. Well female exam with routine gynecological exam Normal gynecologic exam.  Last Pap test July 2019 was negative, no indication to repeat this year.  Breast exam normal.  Screening mammogram July 2020 was negative.  Good body mass index at 22.32.  Increase aerobic activities to 5 times a week and weightlifting every 2 days.  Health labs with family physician.  2. Postmenopause Well on no hormone replacement therapy.  No postmenopausal bleeding.  Vaginal dryness with intercourse, recommend coconut oil.  Vitamin D supplements, calcium intake of 1200 mg daily and regular weightbearing physical activities recommended.  Valerie [name], it was a pleasure seeing you today!

## 2019-03-04 ENCOUNTER — Encounter: Payer: Self-pay | Admitting: Physical Therapy

## 2019-03-04 ENCOUNTER — Encounter

## 2019-03-04 ENCOUNTER — Other Ambulatory Visit: Payer: Self-pay

## 2019-03-04 ENCOUNTER — Ambulatory Visit: Payer: 59 | Admitting: Physical Therapy

## 2019-03-04 DIAGNOSIS — M62838 Other muscle spasm: Secondary | ICD-10-CM

## 2019-03-04 DIAGNOSIS — M546 Pain in thoracic spine: Secondary | ICD-10-CM | POA: Diagnosis not present

## 2019-03-04 DIAGNOSIS — R293 Abnormal posture: Secondary | ICD-10-CM

## 2019-03-04 NOTE — Therapy (Signed)
[place], [place], [address] Phone: [phone]   Fax:  [phone]  Physical Therapy Treatment  Patient Details  Name: Valerie [name] MRN: [id number] Date of Birth: 03/21/1966 Referring Provider (PT): Shanon Ace MD    Encounter Date: 03/04/2019  PT End of Session - 03/04/19 1705    Visit Number  11    Number of Visits  20    Date for PT Re-Evaluation  03/10/19    Authorization Type  [facility]     PT Start Time  1600    PT Stop Time  1655    PT Time Calculation (min)  55 min    Activity Tolerance  Patient tolerated treatment well    Behavior During Therapy  [facility] for tasks assessed/performed       Past Medical History:  Diagnosis Date  . Allergy    cats  . Anemia    NOS  . 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 GERD (gastroesophageal reflux disease)   . HSV infection    labialis  . RECTAL BLEEDING 07/10/2007   Qualifier: Diagnosis of  By: Regis Bill MD, Stand[facility] Brooking 2008 colonoscopy [facility].     Past Surgical History:  Procedure Laterality Date  . CYST REMOVAL HAND Right   . DILITATION & CURRETTAGE/HYSTROSCOPY WITH VERSAPOINT RESECTION  08/27/2012   Procedure: DILATATION & CURETTAGE/HYSTEROSCOPY WITH VERSAPOINT RESECTION;  Surgeon: Princess Bruins, MD;  Location: [facility];  Service: Gynecology;;  . LASIK  2009   mono vision  . TRIGGER FINGER RELEASE Right 06/02/2017   Procedure: RELEASE TRIGGER FINGER/A-1 PULLEY RIGHT RING TRIGGER RELEASE;  Surgeon: Leanora Cover, MD;  Location: [facility];  Service: Orthopedics;  Laterality: Right;    There were no vitals filed for this visit.  Subjective Assessment - 03/04/19 1701    Subjective  Pt. reports did pretty well after last tx. session with decreased pain. Primary areas of discomfort still include left>right upper trapezius and right lumbar region.                       [facility] Adult PT Treatment/Exercise - 03/04/19  0001      Lumbar Exercises: Stretches   Other Lumbar Stretch Exercise  manual Right QL stretch 3 x 30 sec      Moist Heat Therapy   Number Minutes Moist Heat  10 Minutes    Moist Heat Location  Cervical      Manual Therapy   Joint Mobilization  Thoracic PAs grade I-IV    Soft tissue mobilization  STM right lumbar paraspinals, left rhomboid, brief upper trap STM and skilled palpation    Myofascial Release  suboccipital release    Manual Traction  gentle cervical manual traction      Neck Exercises: Stretches   Upper Trapezius Stretch  Right;Left;3 reps;30 seconds    Other Neck Stretches  supine manual left levator stretch    Other Neck Stretches  manual pec minor stretch 3x30 sec, doorway pec stretch 3x30 sec       Trigger Point Dry Needling - 03/04/19 0001    Consent Given?  Yes    Muscles Treated Head and Neck  Upper trapezius    Muscles Treated Upper Quadrant  --   also needled right middle deltoid   Muscles Treated Back/Hip  Erector spinae;Lumbar multifidi;Quadratus lumborum    Dry Needling Comments  40 mm needles used in sidelying for upper  trap, 60 mm needle for right L4-5 multifidi and longissimus and 75 cm for R QL in left sidelying 32 gauge excepting 30 gauge for QL needle    Upper Trapezius Response  Twitch reponse elicited    Lumbar multifidi Response  Twitch response elicited           PT Education - 03/04/19 1705    Education Details  POC    Person(s) Educated  Patient    Methods  Explanation    Comprehension  Verbalized understanding       PT Short Term Goals - 01/27/19 1745      PT SHORT TERM GOAL #1   Title  pt will be I with inital HEP     Baseline  updates ongoing    Time  3    Period  Weeks    Status  On-going    Target Date  02/17/19      PT SHORT TERM GOAL #2   Title  Patient will report decreased tenderness to palpation     Baseline  Improving from baseline but still with tenderness upper traps and thoracic region    Time  3    Period   Weeks    Status  On-going    Target Date  02/17/19      PT SHORT TERM GOAL #3   Title  Patient will demosntrate full left shoulder motion     Baseline  met/able    Time  3    Period  Weeks    Status  Achieved        PT Long Term Goals - 02/24/19 1715      PT LONG TERM GOAL #1   Title  Patient will sit at work for an entire day without self report of increased pain     Baseline  still has pain increase, today 4/10    Time  6    Period  Weeks    Status  On-going      PT LONG TERM GOAL #2   Title  Patient will demonstrate a 19% limitation on FOTO    Baseline  met, 18%    Time  6    Period  Weeks    Status  Achieved      PT LONG TERM GOAL #3   Title  Patient will return to the gym without increased pain     Baseline  gyms closed due to Covid, d/c goal until gyms reopen    Time  6    Period  Weeks    Status  Deferred            Plan - 03/04/19 1706    Clinical Impression Statement  Good tx. response with improving consistency of relief, fair progress overall with tendency postural symptom exacerbation between sessions but improvement "holding" more between sessions.    Stability/Clinical Decision Making  Stable/Uncomplicated    Clinical Decision Making  Low    Rehab Potential  Good    PT Frequency  2x / week    PT Duration  6 weeks    PT Treatment/Interventions  ADLs/Self Care Home Management;Iontophoresis 86m/ml Dexamethasone;Cryotherapy;Moist Heat;Therapeutic activities;Therapeutic exercise;Neuromuscular re-education;Cognitive remediation;Patient/family education;Manual techniques;Spinal Manipulations;Dry needling;Taping;Ultrasound    PT Next Visit Plan  further dry needling as needed, STM, thoracic mobs, stretches and postural/periscapular strengthening    PT Home Exercise Plan  rhomboid stretch; sink stretch; scap retraction; shoulder extension; bilateral ER; bilateral horizontal abduction, upper trap stretch, thoracic extension ROM  Consulted and Agree with  Plan of Care  Patient       Patient will benefit from skilled therapeutic intervention in order to improve the following deficits and impairments:  Pain, Postural dysfunction, Decreased activity tolerance, Decreased endurance, Decreased range of motion, Decreased strength  Visit Diagnosis: 1. Pain in thoracic spine   2. Other muscle spasm   3. Abnormal posture        Problem List Patient Active Problem List   Diagnosis Date Noted  . Trigger point of left shoulder region 09/21/2015  . Impingement syndrome of right shoulder 03/08/2015  . Trigger point of right shoulder region 03/08/2015  . Low back pain 03/16/2014  . Wrist pain, right 02/09/2014  . Nonallopathic lesion of cervical region 11/03/2013  . Nonallopathic lesion of thoracic region 11/03/2013  . Nonallopathic lesion-rib cage 11/03/2013  . Nonallopathic lesion of lumbosacral region 11/03/2013  . Nonallopathic lesion of sacral region 11/03/2013  . Imbalance 10/27/2012  . Neck pain 10/27/2012  . Headache(784.0) 07/15/2012  . Abdominal cramps 02/25/2012  . Abnormal stools 02/25/2012  . Delayed menses 02/25/2012  . Visit for preventive health examination 12/03/2011  . Abdominal bloating 12/03/2011  . DYSESTHESIA 09/20/2009  . ARREST OF BONE DEVELOPMENT OR GROWTH 07/24/2007  . DERMATOPHYTOSIS OF NAIL 07/10/2007  . ALLERGIC RHINITIS 07/10/2007  . GERD 07/10/2007  . BLOOD IN STOOL 07/10/2007  . HERPES LABIALIS, HX OF 07/10/2007    Beaulah Dinning, PT, DPT 03/04/19 7:37 PM  [facility] [address] [place], [place], [address] Phone: [phone]   Fax:  [phone]  Name: Valerie [name] MRN: [id number] Date of Birth: 05/21/1966

## 2019-03-09 ENCOUNTER — Ambulatory Visit (INDEPENDENT_AMBULATORY_CARE_PROVIDER_SITE_OTHER): Payer: 59 | Admitting: *Deleted

## 2019-03-09 DIAGNOSIS — J309 Allergic rhinitis, unspecified: Secondary | ICD-10-CM | POA: Diagnosis not present

## 2019-03-10 ENCOUNTER — Other Ambulatory Visit: Payer: Self-pay

## 2019-03-10 ENCOUNTER — Ambulatory Visit: Payer: 59 | Admitting: Physical Therapy

## 2019-03-10 ENCOUNTER — Encounter: Payer: Self-pay | Admitting: Physical Therapy

## 2019-03-10 DIAGNOSIS — M62838 Other muscle spasm: Secondary | ICD-10-CM

## 2019-03-10 DIAGNOSIS — M546 Pain in thoracic spine: Secondary | ICD-10-CM | POA: Diagnosis not present

## 2019-03-10 DIAGNOSIS — R293 Abnormal posture: Secondary | ICD-10-CM

## 2019-03-10 NOTE — Therapy (Signed)
[place], [place], [address] Phone: [phone]   Fax:  [phone]  Physical Therapy Treatment  Patient Details  Name: Valerie [name] MRN: [id number] Date of Birth: November 01, 1965 Referring Provider (PT): Shanon Ace MD    Encounter Date: 03/10/2019  PT End of Session - 03/10/19 2044    Visit Number  12    Number of Visits  20    Date for PT Re-Evaluation  04/21/19    Authorization Type  [facility] [employer]     PT Start Time  1616    PT Stop Time  1713    PT Time Calculation (min)  57 min    Activity Tolerance  Patient tolerated treatment well    Behavior During Therapy  [facility] for tasks assessed/performed       Past Medical History:  Diagnosis Date  . Allergy    cats  . Anemia    NOS  . 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 GERD (gastroesophageal reflux disease)   . HSV infection    labialis  . RECTAL BLEEDING 07/10/2007   Qualifier: Diagnosis of  By: Regis Bill MD, Standley Brooking 2008 colonoscopy [facility].     Past Surgical History:  Procedure Laterality Date  . CYST REMOVAL HAND Right   . DILITATION & CURRETTAGE/HYSTROSCOPY WITH VERSAPOINT RESECTION  08/27/2012   Procedure: DILATATION & CURETTAGE/HYSTEROSCOPY WITH VERSAPOINT RESECTION;  Surgeon: Princess Bruins, MD;  Location: [facility];  Service: Gynecology;;  . LASIK  2009   mono vision  . TRIGGER FINGER RELEASE Right 06/02/2017   Procedure: RELEASE TRIGGER FINGER/A-1 PULLEY RIGHT RING TRIGGER RELEASE;  Surgeon: Leanora Cover, MD;  Location: [place];  Service: Orthopedics;  Laterality: Right;    There were no vitals filed for this visit.  Subjective Assessment - 03/10/19 1619    Subjective  Pt. rates improvement at 75% since return to therapy after absence due to Covid impact on clinic. Pain 3/10 this AM in upper trapezius region left>right as well as mid-thoracic region into right QL/lumbar region. Primary exacerbating activity still  prolonged sitting at work.    Pertinent History  cervical spine pain; Low back pain     Patient Stated Goals  Less pain, start exercising again     Currently in Pain?  Yes    Pain Score  3     Pain Location  Thoracic    Pain Orientation  Left;Right    Pain Descriptors / Indicators  Burning    Pain Radiating Towards  left>right upper trapezius    Pain Onset  More than a month ago    Pain Frequency  Constant    Aggravating Factors   sitting at work    Pain Relieving Factors  rest, heat    Effect of Pain on Daily Activities  limits positional tolerance for work duties         [facility] PT Assessment - 03/10/19 0001      AROM   Cervical Flexion  50    Cervical Extension  40    Cervical - Right Side Bend  30    Cervical - Left Side Bend  35    Cervical - Right Rotation  70    Cervical - Left Rotation  65                   [facility] Adult PT Treatment/Exercise - 03/10/19 0001      Lumbar Exercises: Stretches  Other Lumbar Stretch Exercise  manual Right QL stretch 3 x 30 sec      Shoulder Exercises: Standing   Other Standing Exercises  brief HEP review Theraband rows and horizontal abduction, seated thoracic extension      Moist Heat Therapy   Number Minutes Moist Heat  10 Minutes    Moist Heat Location  Cervical      Manual Therapy   Joint Mobilization  Thoracic PAs grade I-IV    Soft tissue mobilization  seated STM bilateral upper trapezius region, prone STM rhomboids, STM right lumbar region in sidelying and prone, skilled palpation and monitoring with DN      Neck Exercises: Stretches   Upper Trapezius Stretch  Right;Left;3 reps;30 seconds       Trigger Point Dry Needling - 03/10/19 0001    Consent Given?  Yes    Muscles Treated Head and Neck  Upper trapezius    Muscles Treated Back/Hip  Erector spinae;Quadratus lumborum    Dry Needling Comments  40 mm needles 30 gauge in sidelying for upper trapezius, 75 mm 30 gauge in sidelying for QL    Upper Trapezius  Response  Twitch reponse elicited           PT Education - 03/10/19 2040    Education Details  HEP, POC    Person(s) Educated  Patient    Methods  Explanation;Demonstration    Comprehension  Verbalized understanding;Returned demonstration       PT Short Term Goals - 03/10/19 2044      PT SHORT TERM GOAL #1   Title  pt will be I with inital HEP     Baseline  met    Time  3    Period  Weeks    Status  Achieved      PT SHORT TERM GOAL #2   Title  Patient will report decreased tenderness to palpation     Baseline  Improving from baseline but still with tenderness upper traps and thoracic region    Time  3    Period  Weeks    Status  On-going      PT SHORT TERM GOAL #3   Title  Patient will demosntrate full left shoulder motion     Baseline  met/able    Time  3    Period  Weeks    Status  Achieved        PT Long Term Goals - 03/10/19 2043      PT LONG TERM GOAL #1   Title  Patient will sit at work for an entire day without self report of increased pain     Baseline  still has pain increase, today 3/10    Time  6    Period  Weeks    Status  On-going    Target Date  04/21/19      PT LONG TERM GOAL #2   Title  Patient will demonstrate a 19% limitation on FOTO    Baseline  met, 18%    Time  6    Period  Weeks      PT LONG TERM GOAL #3   Title  Patient will return to the gym without increased pain     Baseline  gyms closed due to Covid, d/c goal until gyms reopen    Time  6    Period  Weeks    Status  Deferred            Plan -  03/10/19 2040    Clinical Impression Statement  Since return to therapy after absence associated with Covid pandemic pt. has been making gradual progress for decreased pain symptoms and improved consistency of relief from previous status. Though improved still with functional limitations for prolonged sitting for work duties/positional tolerance so plan continue PT for a few more weeks pending further progress.     Stability/Clinical Decision Making  Stable/Uncomplicated    Clinical Decision Making  Low    Rehab Potential  Good    PT Frequency  2x / week    PT Duration  6 weeks    PT Treatment/Interventions  ADLs/Self Care Home Management;Iontophoresis 65m/ml Dexamethasone;Cryotherapy;Moist Heat;Therapeutic activities;Therapeutic exercise;Neuromuscular re-education;Cognitive remediation;Patient/family education;Manual techniques;Spinal Manipulations;Dry needling;Taping;Ultrasound    PT Next Visit Plan  further dry needling as needed, STM, thoracic mobs, stretches and postural/periscapular strengthening    PT Home Exercise Plan  rhomboid stretch; sink stretch; scap retraction; shoulder extension; bilateral ER; bilateral horizontal abduction, upper trap stretch, thoracic extension ROM    Consulted and Agree with Plan of Care  Patient       Patient will benefit from skilled therapeutic intervention in order to improve the following deficits and impairments:  Pain, Postural dysfunction, Decreased activity tolerance, Decreased endurance, Decreased range of motion, Decreased strength  Visit Diagnosis: 1. Pain in thoracic spine   2. Other muscle spasm   3. Abnormal posture        Problem List Patient Active Problem List   Diagnosis Date Noted  . Trigger point of left shoulder region 09/21/2015  . Impingement syndrome of right shoulder 03/08/2015  . Trigger point of right shoulder region 03/08/2015  . Low back pain 03/16/2014  . Wrist pain, right 02/09/2014  . Nonallopathic lesion of cervical region 11/03/2013  . Nonallopathic lesion of thoracic region 11/03/2013  . Nonallopathic lesion-rib cage 11/03/2013  . Nonallopathic lesion of lumbosacral region 11/03/2013  . Nonallopathic lesion of sacral region 11/03/2013  . Imbalance 10/27/2012  . Neck pain 10/27/2012  . Headache(784.0) 07/15/2012  . Abdominal cramps 02/25/2012  . Abnormal stools 02/25/2012  . Delayed menses 02/25/2012  . Visit for  preventive health examination 12/03/2011  . Abdominal bloating 12/03/2011  . DYSESTHESIA 09/20/2009  . ARREST OF BONE DEVELOPMENT OR GROWTH 07/24/2007  . DERMATOPHYTOSIS OF NAIL 07/10/2007  . ALLERGIC RHINITIS 07/10/2007  . GERD 07/10/2007  . BLOOD IN STOOL 07/10/2007  . HERPES LABIALIS, HX OF 07/10/2007    CBeaulah Dinning PT, DPT 03/10/19 8:46 PM  [facility]GSun[place] N[place] [address]Phone: [phone]  Fax:  [phone] Name: Valerie STIGERMRN: [id number] of Birth: 606-Feb-1967

## 2019-03-18 ENCOUNTER — Encounter: Payer: Self-pay | Admitting: Physical Therapy

## 2019-03-18 ENCOUNTER — Other Ambulatory Visit: Payer: Self-pay

## 2019-03-18 ENCOUNTER — Ambulatory Visit: Payer: 59 | Admitting: Physical Therapy

## 2019-03-18 DIAGNOSIS — M546 Pain in thoracic spine: Secondary | ICD-10-CM

## 2019-03-18 DIAGNOSIS — R293 Abnormal posture: Secondary | ICD-10-CM | POA: Diagnosis not present

## 2019-03-18 DIAGNOSIS — M62838 Other muscle spasm: Secondary | ICD-10-CM | POA: Diagnosis not present

## 2019-03-18 NOTE — Therapy (Signed)
Valerie [name], [place], [address] Phone: [phone]   Fax:  [phone]  Physical Therapy Treatment  Patient Details  Name: Valerie [name] MRN: [id number] Date of Birth: 12-23-1965 Referring Provider (PT): Shanon Ace MD    Encounter Date: 03/18/2019  PT End of Session - 03/18/19 1703    Visit Number  13    Number of Visits  20    Date for PT Re-Evaluation  04/21/19    Authorization Type  [facility]     PT Start Time  1602    PT Stop Time  1700   time spent for dry needling, estim and MHP not included in timed tx. minutes   PT Time Calculation (min)  58 min    Activity Tolerance  Patient tolerated treatment well    Behavior During Therapy  [facility] for tasks assessed/performed       Past Medical History:  Diagnosis Date  . Allergy    cats  . Anemia    NOS  . 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 GERD (gastroesophageal reflux disease)   . HSV infection    labialis  . RECTAL BLEEDING 07/10/2007   Qualifier: Diagnosis of  By: Regis Bill MD, Standley Brooking 2008 colonoscopy [facility].     Past Surgical History:  Procedure Laterality Date  . CYST REMOVAL HAND Right   . DILITATION & CURRETTAGE/HYSTROSCOPY WITH VERSAPOINT RESECTION  08/27/2012   Procedure: DILATATION & CURETTAGE/HYSTEROSCOPY WITH VERSAPOINT RESECTION;  Surgeon: Princess Bruins, MD;  Location: [facility];  Service: Gynecology;;  . LASIK  2009   mono vision  . TRIGGER FINGER RELEASE Right 06/02/2017   Procedure: RELEASE TRIGGER FINGER/A-1 PULLEY RIGHT RING TRIGGER RELEASE;  Surgeon: Leanora Cover, MD;  Location: [place];  Service: Orthopedics;  Laterality: Right;    There were no vitals filed for this visit.  Subjective Assessment - 03/18/19 1603    Subjective  Pt. reports did OK after last session but noting some exacerbation after some lifting activities at home over the weekend. Pain noted in right lumbar>left upper trapezius  region.    Pertinent History  cervical spine pain; Low back pain     Patient Stated Goals  Less pain, start exercising again     Currently in Pain?  Yes    Pain Score  4     Pain Location  Thoracic    Pain Orientation  Left;Right    Pain Descriptors / Indicators  Burning    Pain Type  Chronic pain    Pain Radiating Towards  left>right upper trapezius and right lower lumbar region    Pain Onset  More than a month ago    Pain Frequency  Constant    Aggravating Factors   sitting at work    Pain Relieving Factors  rest, heat    Effect of Pain on Daily Activities  limits positional tolerance for work duties                       [employer] Adult PT Treatment/Exercise - 03/18/19 0001      Moist Heat Therapy   Number Minutes Moist Heat  10 Minutes    Moist Heat Location  Cervical      Manual Therapy   Soft tissue mobilization  STM upper traps in sitting, right lumbar STM in prone       Trigger Point Dry Needling - 03/18/19 0001  Consent Given?  Yes    Muscles Treated Head and Neck  Upper trapezius    Muscles Treated Back/Hip  Erector spinae;Lumbar multifidi   L4-5 on right   Electrical Stimulation Performed with Dry Needling  Yes    E-stim with Dry Needling Details  TENS 2 pps to right lumbar and left upper trapezius region x 10 min    Upper Trapezius Response  Twitch reponse elicited           PT Education - 03/18/19 1703    Education Details  estim    Person(s) Educated  Patient    Methods  Explanation    Comprehension  Verbalized understanding       PT Short Term Goals - 03/10/19 2044      PT SHORT TERM GOAL #1   Title  pt will be I with inital HEP     Baseline  met    Time  3    Period  Weeks    Status  Achieved      PT SHORT TERM GOAL #2   Title  Patient will report decreased tenderness to palpation     Baseline  Improving from baseline but still with tenderness upper traps and thoracic region    Time  3    Period  Weeks    Status  On-going       PT SHORT TERM GOAL #3   Title  Patient will demosntrate full left shoulder motion     Baseline  met/able    Time  3    Period  Weeks    Status  Achieved        PT Long Term Goals - 03/10/19 2043      PT LONG TERM GOAL #1   Title  Patient will sit at work for an entire day without self report of increased pain     Baseline  still has pain increase, today 3/10    Time  6    Period  Weeks    Status  On-going    Target Date  04/21/19      PT LONG TERM GOAL #2   Title  Patient will demonstrate a 19% limitation on FOTO    Baseline  met, 18%    Time  6    Period  Weeks      PT LONG TERM GOAL #3   Title  Patient will return to the gym without increased pain     Baseline  gyms closed due to Covid, d/c goal until gyms reopen    Time  6    Period  Weeks    Status  Deferred            Plan - 03/18/19 1728    Clinical Impression Statement  As previously pt. responds well to tx. but some tendency symptom exacerbation between sessions. Trial estim today with dry needling to see if more benefit-will await further response by next visit.    Stability/Clinical Decision Making  Stable/Uncomplicated    Clinical Decision Making  Low    Rehab Potential  Good    PT Frequency  2x / week    PT Duration  6 weeks    PT Treatment/Interventions  ADLs/Self Care Home Management;Iontophoresis 41m/ml Dexamethasone;Cryotherapy;Moist Heat;Therapeutic activities;Therapeutic exercise;Neuromuscular re-education;Cognitive remediation;Patient/family education;Manual techniques;Spinal Manipulations;Dry needling;Taping;Ultrasound    PT Next Visit Plan  did estim help?, further dry needling as needed, STM, thoracic mobs, stretches and postural/periscapular strengthening    PT Home Exercise Plan  rhomboid stretch; sink stretch; scap retraction; shoulder extension; bilateral ER; bilateral horizontal abduction, upper trap stretch, thoracic extension ROM       Patient will benefit from skilled  therapeutic intervention in order to improve the following deficits and impairments:  Pain, Postural dysfunction, Decreased activity tolerance, Decreased endurance, Decreased range of motion, Decreased strength  Visit Diagnosis: 1. Pain in thoracic spine   2. Other muscle spasm   3. Abnormal posture        Problem List Patient Active Problem List   Diagnosis Date Noted  . Trigger point of left shoulder region 09/21/2015  . Impingement syndrome of right shoulder 03/08/2015  . Trigger point of right shoulder region 03/08/2015  . Low back pain 03/16/2014  . Wrist pain, right 02/09/2014  . Nonallopathic lesion of cervical region 11/03/2013  . Nonallopathic lesion of thoracic region 11/03/2013  . Nonallopathic lesion-rib cage 11/03/2013  . Nonallopathic lesion of lumbosacral region 11/03/2013  . Nonallopathic lesion of sacral region 11/03/2013  . Imbalance 10/27/2012  . Neck pain 10/27/2012  . Headache(784.0) 07/15/2012  . Abdominal cramps 02/25/2012  . Abnormal stools 02/25/2012  . Delayed menses 02/25/2012  . Visit for preventive health examination 12/03/2011  . Abdominal bloating 12/03/2011  . DYSESTHESIA 09/20/2009  . ARREST OF BONE DEVELOPMENT OR GROWTH 07/24/2007  . DERMATOPHYTOSIS OF NAIL 07/10/2007  . ALLERGIC RHINITIS 07/10/2007  . GERD 07/10/2007  . BLOOD IN STOOL 07/10/2007  . HERPES LABIALIS, HX OF 07/10/2007    Beaulah Dinning, PT, DPT 03/18/19 5:38 PM  [facility] [address] [place], [place], [address] Phone: [phone]   Fax:  [phone]  Name: SANAIYAH KIRCHHOFF MRN: [id number] Date of Birth: 1965/12/29

## 2019-03-23 ENCOUNTER — Ambulatory Visit (INDEPENDENT_AMBULATORY_CARE_PROVIDER_SITE_OTHER): Payer: 59 | Admitting: *Deleted

## 2019-03-23 DIAGNOSIS — J309 Allergic rhinitis, unspecified: Secondary | ICD-10-CM

## 2019-03-29 ENCOUNTER — Other Ambulatory Visit: Payer: Self-pay

## 2019-03-29 ENCOUNTER — Encounter: Payer: Self-pay | Admitting: Physical Therapy

## 2019-03-29 ENCOUNTER — Ambulatory Visit: Payer: 59 | Attending: Internal Medicine | Admitting: Physical Therapy

## 2019-03-29 DIAGNOSIS — M62838 Other muscle spasm: Secondary | ICD-10-CM | POA: Diagnosis not present

## 2019-03-29 DIAGNOSIS — R293 Abnormal posture: Secondary | ICD-10-CM | POA: Diagnosis not present

## 2019-03-29 DIAGNOSIS — M546 Pain in thoracic spine: Secondary | ICD-10-CM | POA: Diagnosis not present

## 2019-03-29 NOTE — Therapy (Signed)
[place], [place], [address] Phone: [phone]   Fax:  [phone]  Physical Therapy Treatment  Patient Details  Name: CASIA CORTI MRN: [id number] Date of Birth: February 21, 1966 Referring Provider (PT): Shanon Ace MD    Encounter Date: 03/29/2019  PT End of Session - 03/29/19 1718    Visit Number  14    Number of Visits  20    Date for PT Re-Evaluation  04/21/19    Authorization Type  [facility]     PT Start Time  1632    PT Stop Time  1730    PT Time Calculation (min)  58 min    Activity Tolerance  Patient tolerated treatment well    Behavior During Therapy  [facility] for tasks assessed/performed       Past Medical History:  Diagnosis Date  . Allergy    cats  . Anemia    NOS  . 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 GERD (gastroesophageal reflux disease)   . HSV infection    labialis  . RECTAL BLEEDING 07/10/2007   Qualifier: Diagnosis of  By: Regis Bill MD, Standley Brooking 2008 colonoscopy [facility].     Past Surgical History:  Procedure Laterality Date  . CYST REMOVAL HAND Right   . DILITATION & CURRETTAGE/HYSTROSCOPY WITH VERSAPOINT RESECTION  08/27/2012   Procedure: DILATATION & CURETTAGE/HYSTEROSCOPY WITH VERSAPOINT RESECTION;  Surgeon: Princess Bruins, MD;  Location: [facility];  Service: Gynecology;;  . LASIK  2009   mono vision  . TRIGGER FINGER RELEASE Right 06/02/2017   Procedure: RELEASE TRIGGER FINGER/A-1 PULLEY RIGHT RING TRIGGER RELEASE;  Surgeon: Leanora Cover, MD;  Location: [place];  Service: Orthopedics;  Laterality: Right;    There were no vitals filed for this visit.  Subjective Assessment - 03/29/19 1634    Subjective  Left upper trap region feeling better after last session but more soreness noted on right side today. Continues also with right lower/lateral lumbar region pain.    Currently in Pain?  Yes    Pain Score  4     Pain Location  Thoracic    Pain  Orientation  Left;Right    Pain Descriptors / Indicators  Burning    Pain Type  Chronic pain    Pain Onset  More than a month ago    Pain Frequency  Constant    Aggravating Factors   sitting at work    Pain Relieving Factors  rest, heat    Effect of Pain on Daily Activities  limits positional tolerance for work duties                       [employer] Adult PT Treatment/Exercise - 03/29/19 0001      Lumbar Exercises: Stretches   ITB Stretch Limitations  supine manual stretch 3x30 sec    Other Lumbar Stretch Exercise  manual Right QL stretch 3 x 30 sec      Moist Heat Therapy   Number Minutes Moist Heat  15 Minutes    Moist Heat Location  Cervical      Manual Therapy   Soft tissue mobilization  STM upper traps bilat. in sitting and right lumbar region in prone      Neck Exercises: Stretches   Upper Trapezius Stretch  Right;Left;2 reps;30 seconds    Levator Stretch  Right;Left;2 reps;30 seconds       Trigger Point Dry Needling -  03/29/19 0001    Consent Given?  Yes    Muscles Treated Head and Neck  Upper trapezius    Muscles Treated Back/Hip  Erector spinae;Lumbar multifidi   L4-5 on right   Electrical Stimulation Performed with Dry Needling  Yes    E-stim with Dry Needling Details  TENS  20 pps to right lumbar and left upper trapezius region x 10 min    Upper Trapezius Response  Twitch reponse elicited           PT Education - 03/29/19 1718    Education Details  POC    Person(s) Educated  Patient    Methods  Explanation    Comprehension  Verbalized understanding       PT Short Term Goals - 03/10/19 2044      PT SHORT TERM GOAL #1   Title  pt will be I with inital HEP     Baseline  met    Time  3    Period  Weeks    Status  Achieved      PT SHORT TERM GOAL #2   Title  Patient will report decreased tenderness to palpation     Baseline  Improving from baseline but still with tenderness upper traps and thoracic region    Time  3    Period  Weeks     Status  On-going      PT SHORT TERM GOAL #3   Title  Patient will demosntrate full left shoulder motion     Baseline  met/able    Time  3    Period  Weeks    Status  Achieved        PT Long Term Goals - 03/10/19 2043      PT LONG TERM GOAL #1   Title  Patient will sit at work for an entire day without self report of increased pain     Baseline  still has pain increase, today 3/10    Time  6    Period  Weeks    Status  On-going    Target Date  04/21/19      PT LONG TERM GOAL #2   Title  Patient will demonstrate a 19% limitation on FOTO    Baseline  met, 18%    Time  6    Period  Weeks      PT LONG TERM GOAL #3   Title  Patient will return to the gym without increased pain     Baseline  gyms closed due to Covid, d/c goal until gyms reopen    Time  6    Period  Weeks    Status  Deferred            Plan - 03/29/19 1718    Clinical Impression Statement  Improved with left-sided symptoms as noted in subjective with more tightness noted on right. Gradually improving as previously but fair progress given ongoing symptoms.    Stability/Clinical Decision Making  Stable/Uncomplicated    Clinical Decision Making  Low    Rehab Potential  Good    PT Frequency  2x / week    PT Duration  6 weeks    PT Treatment/Interventions  ADLs/Self Care Home Management;Iontophoresis 40m/ml Dexamethasone;Cryotherapy;Moist Heat;Therapeutic activities;Therapeutic exercise;Neuromuscular re-education;Cognitive remediation;Patient/family education;Manual techniques;Spinal Manipulations;Dry needling;Taping;Ultrasound    PT Next Visit Plan  did estim help?, further dry needling as needed, STM, thoracic mobs, stretches and postural/periscapular strengthening    PT Home Exercise Plan  rhomboid stretch; sink stretch; scap retraction; shoulder extension; bilateral ER; bilateral horizontal abduction, upper trap stretch, thoracic extension ROM    Consulted and Agree with Plan of Care  Patient        Patient will benefit from skilled therapeutic intervention in order to improve the following deficits and impairments:  Pain, Postural dysfunction, Decreased activity tolerance, Decreased endurance, Decreased range of motion, Decreased strength  Visit Diagnosis: 1. Pain in thoracic spine   2. Other muscle spasm   3. Abnormal posture        Problem List Patient Active Problem List   Diagnosis Date Noted  . Trigger point of left shoulder region 09/21/2015  . Impingement syndrome of right shoulder 03/08/2015  . Trigger point of right shoulder region 03/08/2015  . Low back pain 03/16/2014  . Wrist pain, right 02/09/2014  . Nonallopathic lesion of cervical region 11/03/2013  . Nonallopathic lesion of thoracic region 11/03/2013  . Nonallopathic lesion-rib cage 11/03/2013  . Nonallopathic lesion of lumbosacral region 11/03/2013  . Nonallopathic lesion of sacral region 11/03/2013  . Imbalance 10/27/2012  . Neck pain 10/27/2012  . Headache(784.0) 07/15/2012  . Abdominal cramps 02/25/2012  . Abnormal stools 02/25/2012  . Delayed menses 02/25/2012  . Visit for preventive health examination 12/03/2011  . Abdominal bloating 12/03/2011  . DYSESTHESIA 09/20/2009  . ARREST OF BONE DEVELOPMENT OR GROWTH 07/24/2007  . DERMATOPHYTOSIS OF NAIL 07/10/2007  . ALLERGIC RHINITIS 07/10/2007  . GERD 07/10/2007  . BLOOD IN STOOL 07/10/2007  . HERPES LABIALIS, HX OF 07/10/2007    Beaulah Dinning, PT, DPT 03/29/19 5:22 PM  [facility] [address] [place], [place], [address] Phone: [phone]   Fax:  [phone]  Name: JYA HUGHSTON MRN: [id number] Date of Birth: 28-Mar-1966

## 2019-03-30 ENCOUNTER — Ambulatory Visit (INDEPENDENT_AMBULATORY_CARE_PROVIDER_SITE_OTHER): Payer: 59 | Admitting: *Deleted

## 2019-03-30 DIAGNOSIS — J309 Allergic rhinitis, unspecified: Secondary | ICD-10-CM | POA: Diagnosis not present

## 2019-03-31 ENCOUNTER — Encounter: Payer: Self-pay | Admitting: Physical Therapy

## 2019-03-31 ENCOUNTER — Ambulatory Visit: Payer: 59 | Admitting: Physical Therapy

## 2019-03-31 ENCOUNTER — Encounter: Payer: 59 | Admitting: Physical Therapy

## 2019-03-31 DIAGNOSIS — R293 Abnormal posture: Secondary | ICD-10-CM

## 2019-03-31 DIAGNOSIS — M62838 Other muscle spasm: Secondary | ICD-10-CM

## 2019-03-31 DIAGNOSIS — M546 Pain in thoracic spine: Secondary | ICD-10-CM | POA: Diagnosis not present

## 2019-03-31 NOTE — Therapy (Signed)
[place], [place], [address] Phone: [phone]   Fax:  [phone]  Physical Therapy Treatment  Patient Details  Name: Valerie [name] MRN: [id number] Date of Birth: 1965/11/08 Referring Provider (PT): Shanon Ace MD    Encounter Date: 03/31/2019  PT End of Session - 03/31/19 1810    Visit Number  15    Number of Visits  20    Date for PT Re-Evaluation  04/21/19    Authorization Type  [facility]     PT Start Time  1636    PT Stop Time  1730    PT Time Calculation (min)  54 min    Activity Tolerance  Patient tolerated treatment well    Behavior During Therapy  [facility] for tasks assessed/performed       Past Medical History:  Diagnosis Date  . Allergy    cats  . Anemia    NOS  . 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 GERD (gastroesophageal reflux disease)   . HSV infection    labialis  . RECTAL BLEEDING 07/10/2007   Qualifier: Diagnosis of  By: Regis Bill MD, Standley Brooking 2008 colonoscopy [facility].     Past Surgical History:  Procedure Laterality Date  . CYST REMOVAL HAND Right   . DILITATION & CURRETTAGE/HYSTROSCOPY WITH VERSAPOINT RESECTION  08/27/2012   Procedure: DILATATION & CURETTAGE/HYSTEROSCOPY WITH VERSAPOINT RESECTION;  Surgeon: Princess Bruins, MD;  Location: [facility];  Service: Gynecology;;  . LASIK  2009   mono vision  . TRIGGER FINGER RELEASE Right 06/02/2017   Procedure: RELEASE TRIGGER FINGER/A-1 PULLEY RIGHT RING TRIGGER RELEASE;  Surgeon: Leanora Cover, MD;  Location: [facility];  Service: Orthopedics;  Laterality: Right;    There were no vitals filed for this visit.  Subjective Assessment - 03/31/19 1639    Subjective  Upper trap region is feeling better after last session though still some mild soreness. Pt. reports pain in mid-lower thoracic region on right as well as pain into right lumbar region.    Patient Stated Goals  Less pain, start exercising again     Pain Score  4     Pain Location  Thoracic    Pain Orientation  Left;Right    Pain Descriptors / Indicators  Burning    Pain Type  Chronic pain    Pain Onset  More than a month ago    Pain Frequency  Constant    Aggravating Factors   sitting at work    Pain Relieving Factors  rest and heat    Effect of Pain on Daily Activities  limits positional tolerance for work duties                       [employer] Adult PT Treatment/Exercise - 03/31/19 0001      Moist Heat Therapy   Number Minutes Moist Heat  15 Minutes    Moist Heat Location  Cervical      Manual Therapy   Joint Mobilization  thoracic PAs grade I-IV    Soft tissue mobilization  STM right thoracic paraspinals and right lumbar region in prone, upper traps in sitting       Trigger Point Dry Needling - 03/31/19 0001    Consent Given?  Yes    Muscles Treated Head and Neck  Upper trapezius    Muscles Treated Back/Hip  Erector spinae;Lumbar multifidi   L4-5 on right  Electrical Stimulation Performed with Dry Needling  Yes    E-stim with Dry Needling Details  TENS  20 pps to right lumbar and left upper trapezius region x 10 min    Upper Trapezius Response  Twitch reponse elicited           PT Education - 03/31/19 1810    Education Details  POC    Person(s) Educated  Patient    Methods  Explanation    Comprehension  Verbalized understanding       PT Short Term Goals - 03/10/19 2044      PT SHORT TERM GOAL #1   Title  pt will be I with inital HEP     Baseline  met    Time  3    Period  Weeks    Status  Achieved      PT SHORT TERM GOAL #2   Title  Patient will report decreased tenderness to palpation     Baseline  Improving from baseline but still with tenderness upper traps and thoracic region    Time  3    Period  Weeks    Status  On-going      PT SHORT TERM GOAL #3   Title  Patient will demosntrate full left shoulder motion     Baseline  met/able    Time  3    Period  Weeks    Status   Achieved        PT Long Term Goals - 03/10/19 2043      PT LONG TERM GOAL #1   Title  Patient will sit at work for an entire day without self report of increased pain     Baseline  still has pain increase, today 3/10    Time  6    Period  Weeks    Status  On-going    Target Date  04/21/19      PT LONG TERM GOAL #2   Title  Patient will demonstrate a 19% limitation on FOTO    Baseline  met, 18%    Time  6    Period  Weeks      PT LONG TERM GOAL #3   Title  Patient will return to the gym without increased pain     Baseline  gyms closed due to Covid, d/c goal until gyms reopen    Time  6    Period  Weeks    Status  Deferred            Plan - 03/31/19 1811    Clinical Impression Statement  Pt. responds well to decrease pain but fair status overall with tendency ongoing symptoms/symptom exacerbation with work duties.    Stability/Clinical Decision Making  Stable/Uncomplicated    Clinical Decision Making  Low    Rehab Potential  Good    PT Frequency  2x / week    PT Duration  6 weeks    PT Treatment/Interventions  ADLs/Self Care Home Management;Iontophoresis 56m/ml Dexamethasone;Cryotherapy;Moist Heat;Therapeutic activities;Therapeutic exercise;Neuromuscular re-education;Cognitive remediation;Patient/family education;Manual techniques;Spinal Manipulations;Dry needling;Taping;Ultrasound    PT Next Visit Plan  further dry needling as needed, STM, thoracic mobs, stretches and postural/periscapular strengthening    PT Home Exercise Plan  rhomboid stretch; sink stretch; scap retraction; shoulder extension; bilateral ER; bilateral horizontal abduction, upper trap stretch, thoracic extension ROM    Consulted and Agree with Plan of Care  Patient       Patient will benefit from skilled therapeutic intervention in order  to improve the following deficits and impairments:  Pain, Postural dysfunction, Decreased activity tolerance, Decreased endurance, Decreased range of motion,  Decreased strength  Visit Diagnosis: 1. Pain in thoracic spine   2. Other muscle spasm   3. Abnormal posture        Problem List Patient Active Problem List   Diagnosis Date Noted  . Trigger point of left shoulder region 09/21/2015  . Impingement syndrome of right shoulder 03/08/2015  . Trigger point of right shoulder region 03/08/2015  . Low back pain 03/16/2014  . Wrist pain, right 02/09/2014  . Nonallopathic lesion of cervical region 11/03/2013  . Nonallopathic lesion of thoracic region 11/03/2013  . Nonallopathic lesion-rib cage 11/03/2013  . Nonallopathic lesion of lumbosacral region 11/03/2013  . Nonallopathic lesion of sacral region 11/03/2013  . Imbalance 10/27/2012  . Neck pain 10/27/2012  . Headache(784.0) 07/15/2012  . Abdominal cramps 02/25/2012  . Abnormal stools 02/25/2012  . Delayed menses 02/25/2012  . Visit for preventive health examination 12/03/2011  . Abdominal bloating 12/03/2011  . DYSESTHESIA 09/20/2009  . ARREST OF BONE DEVELOPMENT OR GROWTH 07/24/2007  . DERMATOPHYTOSIS OF NAIL 07/10/2007  . ALLERGIC RHINITIS 07/10/2007  . GERD 07/10/2007  . BLOOD IN STOOL 07/10/2007  . HERPES LABIALIS, HX OF 07/10/2007    Beaulah Dinning, PT, DPT 03/31/19 6:13 PM  [facility] [address] [place], [place], [address] Phone: [phone]   Fax:  [phone]  Name: Valerie [name] MRN: [id number] Date of Birth: October 19, 1965

## 2019-04-02 ENCOUNTER — Encounter

## 2019-04-09 ENCOUNTER — Ambulatory Visit: Payer: 59 | Admitting: Allergy

## 2019-04-09 ENCOUNTER — Encounter: Payer: Self-pay | Admitting: Allergy

## 2019-04-09 ENCOUNTER — Other Ambulatory Visit: Payer: Self-pay

## 2019-04-09 VITALS — BP 120/78 | HR 75 | Temp 97.7°F | Resp 16 | Ht 63.5 in | Wt 127.8 lb

## 2019-04-09 DIAGNOSIS — J309 Allergic rhinitis, unspecified: Secondary | ICD-10-CM | POA: Diagnosis not present

## 2019-04-09 NOTE — Progress Notes (Signed)
Follow-up Note  RE: Valerie [name] MRN: [id number] DOB: 1966/02/12 Date of Office Visit: 04/09/2019   History of present illness: Valerie [name] is a 53 y.o. female presenting today for follow-up of allergic rhinitis.  She was last seen in the office on 12/30/2018 by myself.  Since this visit she was restarted on allergen immunotherapy and is receiving weekly injections at this time.  Tolerating injections without large local or systemic reactions.  She states she can already tell they are helping and she can garden now without significant increase in nasal congestion/drainage or sneezing.  However she states she did vacation in a cabin this summer in the woods and she had increased nasal congestion/drainage, sinus pressure.  She also reports mild sinus tenderness over maxillary sinus.  She is using astelin and states it does reduce drainage but she doesn't like the bitter taste.  She also uses either zyrtec or xyzal for antihistamine control.  She has access to epinephrine device.   Review of systems: Review of Systems  Constitutional: Negative for chills, fever and malaise/fatigue.  HENT: Positive for congestion and sinus pain. Negative for ear discharge, ear pain, nosebleeds and sore throat.   Eyes: Negative for pain, discharge and redness.  Respiratory: Negative.   Gastrointestinal: Negative.   Musculoskeletal: Negative.   Skin: Negative for itching and rash.  Neurological: Negative.     All other systems negative unless noted above in HPI  Past medical/social/surgical/family history have been reviewed and are unchanged unless specifically indicated below.  No changes  Medication List: Allergies as of 04/09/2019      Reactions   Augmentin [amoxicillin-pot Clavulanate] Diarrhea   Ceftin [cefuroxime Axetil] Other (See Comments)   Severe yeast infection   Nsaids Nausea Only, Other (See Comments)   Abdominal pain / severe reflux/chest pain      Medication List       Accurate  as of April 09, 2019  2:09 PM. If you have any questions, ask your nurse or doctor.        azelastine 0.1 % nasal spray Commonly known as: ASTELIN Place 2 sprays into both nostrils 2 (two) times daily. Use in each nostril as directed   cetirizine 10 MG tablet Commonly known as: ZYRTEC Take 10 mg by mouth daily as needed for allergies.   cholecalciferol 1000 units tablet Commonly known as: VITAMIN D Take 1,000 Units by mouth daily.   Fish Oil 1200 MG Caps Take 1,200 mg by mouth daily.   multivitamin with minerals Tabs tablet Take 1 tablet by mouth daily.   multivitamin-lutein Caps capsule Take 1 capsule by mouth daily.   TURMERIC PO Take by mouth.   valACYclovir 1000 MG tablet Commonly known as: Valtrex Take 2 TABLETS BY MOUTH AND REPEATIN 12 HOURS OR AS DIRECTED   VITAMIN B-12 PO Take by mouth.   VITAMIN C PO Take by mouth.       Known medication allergies: Allergies  Allergen Reactions  . Augmentin [Amoxicillin-Pot Clavulanate] Diarrhea  . Ceftin [Cefuroxime Axetil] Other (See Comments)    Severe yeast infection  . Nsaids Nausea Only and Other (See Comments)    Abdominal pain / severe reflux/chest pain     Physical examination: Blood pressure 120/78, pulse 75, temperature 97.7 F (36.5 C), temperature source Temporal, resp. rate 16, height 5' 3.5" (1.613 m), weight 127 lb 12.8 oz (58 kg), last menstrual period 11/28/2014, SpO2 98 %.  General: Alert, interactive, in no acute distress. HEENT: PERRLA, TMs  pearly gray, turbinates mildly edematous with clear discharge, post-pharynx non erythematous. Neck: Supple without lymphadenopathy. Lungs: Clear to auscultation without wheezing, rhonchi or rales. {no increased work of breathing. CV: Normal S1, S2 without murmurs. Abdomen: Nondistended, nontender. Skin: Warm and dry, without lesions or rashes. Extremities:  No clubbing, cyanosis or edema. Neuro:   Grossly intact.  Diagnositics/Labs: None today   Assessment and plan:   Allergiec rhinitis  - continue appropriate allergen avoidance measures  - continue allergen immunotherapy (allergy shots) per protocol and have access to your epinephrine device   - recommend performing nasal saline rinse 3-7 days a week to help keep nose/sinuses clear  - continue daily antihistamine Zyrtec 10mg  or Xyzal 5mg  as needed  - continue Astelin 1-2 sprays twice a day to help with nasal drainage as needed  Follow-up 6 months or sooner if needed   I appreciate the opportunity to take part in Valerie [name]'s care. Please do not hesitate to contact me with questions.  Sincerely,   Prudy Feeler, MD Allergy/Immunology [facility]

## 2019-04-09 NOTE — Patient Instructions (Addendum)
Allergies   - continue appropriate allergen avoidance measures  - continue allergen immunotherapy (allergy shots) per protocol and have access to your epinephrine device   - recommend performing nasal saline rinse 3-7 days a week to help keep nose/sinuses clear  - continue daily antihistamine Zyrtec 10mg  or Xyzal 5mg  as needed  - continue Astelin 1-2 sprays twice a day to help with nasal drainage as needed   Follow-up 6 months or sooner if needed

## 2019-04-13 ENCOUNTER — Other Ambulatory Visit: Payer: Self-pay

## 2019-04-13 ENCOUNTER — Ambulatory Visit: Payer: 59 | Admitting: Physical Therapy

## 2019-04-13 ENCOUNTER — Encounter: Payer: Self-pay | Admitting: Physical Therapy

## 2019-04-13 DIAGNOSIS — R293 Abnormal posture: Secondary | ICD-10-CM

## 2019-04-13 DIAGNOSIS — M546 Pain in thoracic spine: Secondary | ICD-10-CM | POA: Diagnosis not present

## 2019-04-13 DIAGNOSIS — M62838 Other muscle spasm: Secondary | ICD-10-CM

## 2019-04-13 NOTE — Therapy (Signed)
[place], [place], [address] Phone: [phone]   Fax:  [phone]  Physical Therapy Treatment  Patient Details  Name: Valerie [name] MRN: [id number] Date of Birth: 1966-06-30 Referring Provider (PT): Shanon Ace MD    Encounter Date: 04/13/2019  PT End of Session - 04/13/19 1629    Visit Number  16    Number of Visits  20    Date for PT Re-Evaluation  04/21/19    Authorization Type  [facility]     PT Start Time  1629    PT Stop Time  1709    PT Time Calculation (min)  40 min    Activity Tolerance  Patient tolerated treatment well       Past Medical History:  Diagnosis Date  . Allergy    cats  . Anemia    NOS  . 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 GERD (gastroesophageal reflux disease)   . HSV infection    labialis  . RECTAL BLEEDING 07/10/2007   Qualifier: Diagnosis of  By: Regis Bill MD, Standley Brooking 2008 colonoscopy [facility].     Past Surgical History:  Procedure Laterality Date  . CYST REMOVAL HAND Right   . DILITATION & CURRETTAGE/HYSTROSCOPY WITH VERSAPOINT RESECTION  08/27/2012   Procedure: DILATATION & CURETTAGE/HYSTEROSCOPY WITH VERSAPOINT RESECTION;  Surgeon: Princess Bruins, MD;  Location: [facility];  Service: Gynecology;;  . LASIK  2009   mono vision  . TRIGGER FINGER RELEASE Right 06/02/2017   Procedure: RELEASE TRIGGER FINGER/A-1 PULLEY RIGHT RING TRIGGER RELEASE;  Surgeon: Leanora Cover, MD;  Location: [facility];  Service: Orthopedics;  Laterality: Right;    There were no vitals filed for this visit.  Subjective Assessment - 04/13/19 1632    Subjective  " I am still having tightness across the upper traps and I have been having L shoulder pain going into my upper arm"    Currently in Pain?  Yes                       [facility] Adult PT Treatment/Exercise - 04/13/19 0001      Shoulder Exercises: Supine   Other Supine Exercises  foam roll routine  ceiling punches, horizontal abd/add, alternating ceiling punches, and x to Y 2 x 10      Manual Therapy   Manual Therapy  Scapular mobilization    Joint Mobilization  thoracic PAs grade I-IV    Soft tissue mobilization  STM right thoracic paraspinals and bil upper trap, L rhomboids    Scapular Mobilization  scpular upward rotation grade III for L shoulder      Neck Exercises: Stretches   Upper Trapezius Stretch  2 reps;30 seconds    Levator Stretch  2 reps;30 seconds       Trigger Point Dry Needling - 04/13/19 0001    Consent Given?  Yes    Muscles Treated Upper Quadrant  Rhomboids    Electrical Stimulation Performed with Dry Needling  Yes    E-stim with Dry Needling Details  TENS  20 pps, bil upper trap increasing intensity at 4 min    Upper Trapezius Response  Twitch reponse elicited    Rhomboids Response  Palpable increased muscle length;Twitch response elicited             PT Short Term Goals - 03/10/19 2044      PT SHORT TERM GOAL #1  Title  pt will be I with inital HEP     Baseline  met    Time  3    Period  Weeks    Status  Achieved      PT SHORT TERM GOAL #2   Title  Patient will report decreased tenderness to palpation     Baseline  Improving from baseline but still with tenderness upper traps and thoracic region    Time  3    Period  Weeks    Status  On-going      PT SHORT TERM GOAL #3   Title  Patient will demosntrate full left shoulder motion     Baseline  met/able    Time  3    Period  Weeks    Status  Achieved        PT Long Term Goals - 03/10/19 2043      PT LONG TERM GOAL #1   Title  Patient will sit at work for an entire day without self report of increased pain     Baseline  still has pain increase, today 3/10    Time  6    Period  Weeks    Status  On-going    Target Date  04/21/19      PT LONG TERM GOAL #2   Title  Patient will demonstrate a 19% limitation on FOTO    Baseline  met, 18%    Time  6    Period  Weeks      PT LONG  TERM GOAL #3   Title  Patient will return to the gym without increased pain     Baseline  gyms closed due to Covid, d/c goal until gyms reopen    Time  6    Period  Weeks    Status  Deferred            Plan - 04/13/19 1701    Clinical Impression Statement  pt noted continued soreness in the upper traps. continued TPDN combined with E-stim for the upper trap. worked scapular mobility / thoracic stability.       Patient will benefit from skilled therapeutic intervention in order to improve the following deficits and impairments:     Visit Diagnosis: Pain in thoracic spine  Other muscle spasm  Abnormal posture     Problem List Patient Active Problem List   Diagnosis Date Noted  . Trigger point of left shoulder region 09/21/2015  . Impingement syndrome of right shoulder 03/08/2015  . Trigger point of right shoulder region 03/08/2015  . Low back pain 03/16/2014  . Wrist pain, right 02/09/2014  . Nonallopathic lesion of cervical region 11/03/2013  . Nonallopathic lesion of thoracic region 11/03/2013  . Nonallopathic lesion-rib cage 11/03/2013  . Nonallopathic lesion of lumbosacral region 11/03/2013  . Nonallopathic lesion of sacral region 11/03/2013  . Imbalance 10/27/2012  . Neck pain 10/27/2012  . Headache(784.0) 07/15/2012  . Abdominal cramps 02/25/2012  . Abnormal stools 02/25/2012  . Delayed menses 02/25/2012  . Visit for preventive health examination 12/03/2011  . Abdominal bloating 12/03/2011  . DYSESTHESIA 09/20/2009  . ARREST OF BONE DEVELOPMENT OR GROWTH 07/24/2007  . DERMATOPHYTOSIS OF NAIL 07/10/2007  . ALLERGIC RHINITIS 07/10/2007  . GERD 07/10/2007  . BLOOD IN STOOL 07/10/2007  . HERPES LABIALIS, HX OF 07/10/2007   Starr Lake PT, DPT, LAT, ATC  04/13/19  5:15 PM      [place]  [place], [place], [address] Phone: [phone]   Fax:  [phone]  Name: Valerie [name] MRN: [id number] Date of Birth: 1966-02-20

## 2019-04-15 ENCOUNTER — Ambulatory Visit (INDEPENDENT_AMBULATORY_CARE_PROVIDER_SITE_OTHER): Payer: 59 | Admitting: *Deleted

## 2019-04-15 ENCOUNTER — Encounter: Payer: Self-pay | Admitting: Physical Therapy

## 2019-04-15 ENCOUNTER — Other Ambulatory Visit: Payer: Self-pay

## 2019-04-15 ENCOUNTER — Ambulatory Visit: Payer: 59 | Admitting: Physical Therapy

## 2019-04-15 DIAGNOSIS — M62838 Other muscle spasm: Secondary | ICD-10-CM | POA: Diagnosis not present

## 2019-04-15 DIAGNOSIS — J309 Allergic rhinitis, unspecified: Secondary | ICD-10-CM

## 2019-04-15 DIAGNOSIS — M546 Pain in thoracic spine: Secondary | ICD-10-CM | POA: Diagnosis not present

## 2019-04-15 DIAGNOSIS — R293 Abnormal posture: Secondary | ICD-10-CM | POA: Diagnosis not present

## 2019-04-15 NOTE — Therapy (Signed)
[place], [place], [address] Phone: [phone]   Fax:  [phone]  Physical Therapy Treatment  Patient Details  Name: Valerie [name] MRN: [id number] Date of Birth: 03-22-1966 Referring Provider (PT): Shanon Ace MD    Encounter Date: 04/15/2019  PT End of Session - 04/15/19 2043    Visit Number  17    Number of Visits  20    Date for PT Re-Evaluation  04/21/19    Authorization Type  [employer]     PT Start Time  1726   pt. arrived late   PT Stop Time  1812   time spent for dry needling and MHP not included in timed tx. minutes   PT Time Calculation (min)  46 min    Activity Tolerance  Patient tolerated treatment well    Behavior During Therapy  [facility] for tasks assessed/performed       Past Medical History:  Diagnosis Date  . Allergy    cats  . Anemia    NOS  . 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 GERD (gastroesophageal reflux disease)   . HSV infection    labialis  . RECTAL BLEEDING 07/10/2007   Qualifier: Diagnosis of  By: Regis Bill MD, Standley Brooking 2008 colonoscopy [facility].     Past Surgical History:  Procedure Laterality Date  . CYST REMOVAL HAND Right   . DILITATION & CURRETTAGE/HYSTROSCOPY WITH VERSAPOINT RESECTION  08/27/2012   Procedure: DILATATION & CURETTAGE/HYSTEROSCOPY WITH VERSAPOINT RESECTION;  Surgeon: Princess Bruins, MD;  Location: [facility];  Service: Gynecology;;  . LASIK  2009   mono vision  . TRIGGER FINGER RELEASE Right 06/02/2017   Procedure: RELEASE TRIGGER FINGER/A-1 PULLEY RIGHT RING TRIGGER RELEASE;  Surgeon: Leanora Cover, MD;  Location: [place];  Service: Orthopedics;  Laterality: Right;    There were no vitals filed for this visit.  Subjective Assessment - 04/15/19 2038    Subjective  Pt. c/o soreness from left posterior scapular region into left posterior proximal arm as well as left rhombiod region,                        [facility] Adult PT Treatment/Exercise - 04/15/19 0001      Moist Heat Therapy   Number Minutes Moist Heat  10 Minutes    Moist Heat Location  Cervical      Manual Therapy   Joint Mobilization  thoracic mobilization including brief supine and seated grade V    Soft tissue mobilization  STM left posterior scapular region, left rhomboid and left upper trapezius in prone and right sidelying       Trigger Point Dry Needling - 04/15/19 0001    Consent Given?  Yes    Muscles Treated Upper Quadrant  Infraspinatus;Latissimus dorsi;Teres minor    Dry Needling Comments  pt. had difficulty with hammerlock position for left shoulder so avoided rhomboid needling, performed STM only           PT Education - 04/15/19 2042    Education Details  POC, scapular muscular anatomy with referral pattern    Person(s) Educated  Patient    Methods  Explanation    Comprehension  Verbalized understanding       PT Short Term Goals - 03/10/19 2044      PT SHORT TERM GOAL #1   Title  pt will be I with inital HEP  Baseline  met    Time  3    Period  Weeks    Status  Achieved      PT SHORT TERM GOAL #2   Title  Patient will report decreased tenderness to palpation     Baseline  Improving from baseline but still with tenderness upper traps and thoracic region    Time  3    Period  Weeks    Status  On-going      PT SHORT TERM GOAL #3   Title  Patient will demosntrate full left shoulder motion     Baseline  met/able    Time  3    Period  Weeks    Status  Achieved        PT Long Term Goals - 03/10/19 2043      PT LONG TERM GOAL #1   Title  Patient will sit at work for an entire day without self report of increased pain     Baseline  still has pain increase, today 3/10    Time  6    Period  Weeks    Status  On-going    Target Date  04/21/19      PT LONG TERM GOAL #2   Title  Patient will demonstrate a 19% limitation on FOTO    Baseline  met, 18%     Time  6    Period  Weeks      PT LONG TERM GOAL #3   Title  Patient will return to the gym without increased pain     Baseline  gyms closed due to Covid, d/c goal until gyms reopen    Time  6    Period  Weeks    Status  Deferred            Plan - 04/15/19 2044    Clinical Impression Statement  Abbreviated session due to pt. running late wih focus on STM and thoracic mobilization as well as dry needling to regions as noted per flowsheet. Fair progress given tendency pain recurrence/persistence.    Stability/Clinical Decision Making  Stable/Uncomplicated    Clinical Decision Making  Low    Rehab Potential  Fair    PT Frequency  2x / week    PT Duration  6 weeks    PT Treatment/Interventions  ADLs/Self Care Home Management;Iontophoresis 4m/ml Dexamethasone;Cryotherapy;Moist Heat;Therapeutic activities;Therapeutic exercise;Neuromuscular re-education;Cognitive remediation;Patient/family education;Manual techniques;Spinal Manipulations;Dry needling;Taping;Ultrasound    PT Next Visit Plan  further dry needling as needed, STM, thoracic mobs, stretches and postural/periscapular strengthening    PT Home Exercise Plan  rhomboid stretch; sink stretch; scap retraction; shoulder extension; bilateral ER; bilateral horizontal abduction, upper trap stretch, thoracic extension ROM    Consulted and Agree with Plan of Care  Patient       Patient will benefit from skilled therapeutic intervention in order to improve the following deficits and impairments:  Pain, Postural dysfunction, Decreased activity tolerance, Decreased endurance, Decreased range of motion, Decreased strength  Visit Diagnosis: Pain in thoracic spine  Other muscle spasm  Abnormal posture     Problem List Patient Active Problem List   Diagnosis Date Noted  . Trigger point of left shoulder region 09/21/2015  . Impingement syndrome of right shoulder 03/08/2015  . Trigger point of right shoulder region 03/08/2015  . Low  back pain 03/16/2014  . Wrist pain, right 02/09/2014  . Nonallopathic lesion of cervical region 11/03/2013  . Nonallopathic lesion of thoracic region 11/03/2013  . Nonallopathic  lesion-rib cage 11/03/2013  . Nonallopathic lesion of lumbosacral region 11/03/2013  . Nonallopathic lesion of sacral region 11/03/2013  . Imbalance 10/27/2012  . Neck pain 10/27/2012  . Headache(784.0) 07/15/2012  . Abdominal cramps 02/25/2012  . Abnormal stools 02/25/2012  . Delayed menses 02/25/2012  . Visit for preventive health examination 12/03/2011  . Abdominal bloating 12/03/2011  . DYSESTHESIA 09/20/2009  . ARREST OF BONE DEVELOPMENT OR GROWTH 07/24/2007  . DERMATOPHYTOSIS OF NAIL 07/10/2007  . ALLERGIC RHINITIS 07/10/2007  . GERD 07/10/2007  . BLOOD IN STOOL 07/10/2007  . HERPES LABIALIS, HX OF 07/10/2007    Beaulah Dinning, PT, DPT 04/15/19 8:46 PM  [place] [facility] [address] [place], [place], [address] Phone: [phone]   Fax:  [phone]  Name: CANIYA TAGLE MRN: [id number] Date of Birth: [birth year]

## 2019-04-21 ENCOUNTER — Other Ambulatory Visit: Payer: Self-pay

## 2019-04-21 ENCOUNTER — Encounter: Payer: Self-pay | Admitting: Physical Therapy

## 2019-04-21 ENCOUNTER — Ambulatory Visit: Payer: 59 | Attending: Internal Medicine | Admitting: Physical Therapy

## 2019-04-21 DIAGNOSIS — M62838 Other muscle spasm: Secondary | ICD-10-CM | POA: Insufficient documentation

## 2019-04-21 DIAGNOSIS — R293 Abnormal posture: Secondary | ICD-10-CM | POA: Diagnosis not present

## 2019-04-21 DIAGNOSIS — M546 Pain in thoracic spine: Secondary | ICD-10-CM | POA: Insufficient documentation

## 2019-04-21 NOTE — Therapy (Signed)
[place], [place], [address] Phone: [phone]   Fax:  [phone]  Physical Therapy Treatment/Discharge  Patient Details  Name: Valerie [name] MRN: [id number] Date of Birth: [birth year] Referring Provider (PT): Shanon Ace MD    Encounter Date: 04/21/2019  PT End of Session - 04/21/19 1717    Visit Number  18    Number of Visits  20    Date for PT Re-Evaluation  04/21/19    Authorization Type  [facility] [employer]     PT Start Time  1287   arrived several minutes late   PT Stop Time  1810   time spent for dry needling and MHP not included in direct tx. minutes   PT Time Calculation (min)  51 min    Activity Tolerance  Patient tolerated treatment well    Behavior During Therapy  [facility] for tasks assessed/performed       Past Medical History:  Diagnosis Date  . Allergy    cats  . Anemia    NOS  . 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 GERD (gastroesophageal reflux disease)   . HSV infection    labialis  . RECTAL BLEEDING 07/10/2007   Qualifier: Diagnosis of  By: Regis Bill MD, Standley Brooking 2008 colonoscopy [facility].     Past Surgical History:  Procedure Laterality Date  . CYST REMOVAL HAND Right   . DILITATION & CURRETTAGE/HYSTROSCOPY WITH VERSAPOINT RESECTION  08/27/2012   Procedure: DILATATION & CURETTAGE/HYSTEROSCOPY WITH VERSAPOINT RESECTION;  Surgeon: Princess Bruins, MD;  Location: [facility];  Service: Gynecology;;  . LASIK  2009   mono vision  . TRIGGER FINGER RELEASE Right 06/02/2017   Procedure: RELEASE TRIGGER FINGER/A-1 PULLEY RIGHT RING TRIGGER RELEASE;  Surgeon: Leanora Cover, MD;  Location: [place];  Service: Orthopedics;  Laterality: Right;    There were no vitals filed for this visit.  Subjective Assessment - 04/21/19 1805    Subjective  Pt. reports feels like has improved to near baseline prior to last flare up of symptoms. Discussed plan d/c to HEP and pt. in agreement.  Mild-moderate upper trapezius and rhomboid region pain.    Patient Stated Goals  Less pain, start exercising again     Currently in Pain?  Yes    Pain Score  3     Pain Location  Thoracic    Pain Orientation  Left;Right    Pain Descriptors / Indicators  Burning    Pain Type  Chronic pain    Pain Onset  More than a month ago    Pain Frequency  Constant    Aggravating Factors   sitting at work    Pain Relieving Factors  rest and heat    Effect of Pain on Daily Activities  limits positional tolerance for work duties.         [facility] PT Assessment - 04/21/19 0001      Observation/Other Assessments   Focus on Therapeutic Outcomes (FOTO)   24% limited                   [facility] Adult PT Treatment/Exercise - 04/21/19 0001      Moist Heat Therapy   Number Minutes Moist Heat  10 Minutes    Moist Heat Location  Cervical      Manual Therapy   Soft tissue mobilization  STM in sidelying and seated to bilateral upper trapezius, right rhomboid and left serratus region  Manual Traction  cervical manual traction and suboccipital release      Neck Exercises: Stretches   Upper Trapezius Stretch  Right;Left;3 reps;30 seconds    Levator Stretch  Right;Left;3 reps;30 seconds       Trigger Point Dry Needling - 04/21/19 0001    Consent Given?  Yes    Muscles Treated Head and Neck  Upper trapezius    Dry Needling Comments  bilat. upper traps ea.  side in sidelying with 40 mm 30-32 gauge needles           PT Education - 04/21/19 1804    Education Details  POC    Person(s) Educated  Patient    Methods  Explanation    Comprehension  Verbalized understanding       PT Short Term Goals - 04/21/19 2007      PT SHORT TERM GOAL #1   Title  pt will be I with inital HEP     Baseline  met    Time  3    Period  Weeks    Status  Achieved      PT SHORT TERM GOAL #2   Title  Patient will report decreased tenderness to palpation     Time  3    Period  Weeks    Status  Achieved       PT SHORT TERM GOAL #3   Title  Patient will demosntrate full left shoulder motion     Baseline  met/able    Time  3    Period  Weeks    Status  Achieved        PT Long Term Goals - 04/21/19 2009      PT LONG TERM GOAL #1   Title  Patient will sit at work for an entire day without self report of increased pain     Baseline  still has pain increase, today 3/10    Time  6    Period  Weeks    Status  Not Met      PT LONG TERM GOAL #2   Title  Patient will demonstrate a 19% limitation on FOTO    Baseline  24% limited    Time  6    Period  Weeks    Status  Not Met      PT LONG TERM GOAL #3   Title  Patient will return to the gym without increased pain     Baseline  gyms closed due to Covid, d/c goal until gyms reopen    Time  6    Period  Weeks    Status  Deferred            Plan - 04/21/19 1809    Clinical Impression Statement  Pt. has made gradual gains since return to therapy after exacerbation noted on return from absence after Covid. She continues with tendency mild-moderate muscular pain which should hopefully be manageable at this point with HEP-plan d/c to HEP and pt. will follow up with her MD if having future changes in status.    Stability/Clinical Decision Making  Stable/Uncomplicated    Clinical Decision Making  Low    Rehab Potential  Fair    PT Frequency  2x / week    PT Duration  6 weeks    PT Treatment/Interventions  ADLs/Self Care Home Management;Iontophoresis 3m/ml Dexamethasone;Cryotherapy;Moist Heat;Therapeutic activities;Therapeutic exercise;Neuromuscular re-education;Cognitive remediation;Patient/family education;Manual techniques;Spinal Manipulations;Dry needling;Taping;Ultrasound    PT Next Visit Plan  NA  PT Home Exercise Plan  rhomboid stretch; sink stretch; scap retraction; shoulder extension; bilateral ER; bilateral horizontal abduction, upper trap stretch, thoracic extension ROM    Consulted and Agree with Plan of Care  Patient        Patient will benefit from skilled therapeutic intervention in order to improve the following deficits and impairments:  Pain, Postural dysfunction, Decreased activity tolerance, Decreased endurance, Decreased range of motion, Decreased strength  Visit Diagnosis: Pain in thoracic spine  Other muscle spasm  Abnormal posture     Problem List Patient Active Problem List   Diagnosis Date Noted  . Trigger point of left shoulder region 09/21/2015  . Impingement syndrome of right shoulder 03/08/2015  . Trigger point of right shoulder region 03/08/2015  . Low back pain 03/16/2014  . Wrist pain, right 02/09/2014  . Nonallopathic lesion of cervical region 11/03/2013  . Nonallopathic lesion of thoracic region 11/03/2013  . Nonallopathic lesion-rib cage 11/03/2013  . Nonallopathic lesion of lumbosacral region 11/03/2013  . Nonallopathic lesion of sacral region 11/03/2013  . Imbalance 10/27/2012  . Neck pain 10/27/2012  . Headache(784.0) 07/15/2012  . Abdominal cramps 02/25/2012  . Abnormal stools 02/25/2012  . Delayed menses 02/25/2012  . Visit for preventive health examination 12/03/2011  . Abdominal bloating 12/03/2011  . DYSESTHESIA 09/20/2009  . ARREST OF BONE DEVELOPMENT OR GROWTH 07/24/2007  . DERMATOPHYTOSIS OF NAIL 07/10/2007  . ALLERGIC RHINITIS 07/10/2007  . GERD 07/10/2007  . BLOOD IN STOOL 07/10/2007  . HERPES LABIALIS, HX OF 07/10/2007       PHYSICAL THERAPY DISCHARGE SUMMARY  Visits from Start of Care: 18  Current functional level related to goals / functional outcomes: Goals partially met, paient near PLOF so plan continue with HEP   Remaining deficits: Decreased sitting tolerance due to myofascial pain   Education / Equipment: HEP, POC Plan: Patient agrees to discharge.  Patient goals were partially met. Patient is being discharged due to being pleased with the current functional level.  ?????          Beaulah Dinning, PT, DPT 04/21/19  8:11 PM    [place], [place], [address] Phone: [phone]   Fax:  [phone]  Name: Valerie [name] MRN: [id number] Date of Birth: June 16, 1966

## 2019-04-27 ENCOUNTER — Ambulatory Visit: Payer: 59 | Admitting: Physical Therapy

## 2019-04-29 ENCOUNTER — Ambulatory Visit: Payer: 59 | Admitting: Physical Therapy

## 2019-04-29 ENCOUNTER — Ambulatory Visit (INDEPENDENT_AMBULATORY_CARE_PROVIDER_SITE_OTHER): Payer: 59 | Admitting: *Deleted

## 2019-04-29 DIAGNOSIS — J309 Allergic rhinitis, unspecified: Secondary | ICD-10-CM | POA: Diagnosis not present

## 2019-05-03 ENCOUNTER — Ambulatory Visit (INDEPENDENT_AMBULATORY_CARE_PROVIDER_SITE_OTHER): Payer: 59 | Admitting: *Deleted

## 2019-05-03 DIAGNOSIS — J309 Allergic rhinitis, unspecified: Secondary | ICD-10-CM | POA: Diagnosis not present

## 2019-05-04 ENCOUNTER — Ambulatory Visit: Payer: 59 | Admitting: Physical Therapy

## 2019-05-05 ENCOUNTER — Ambulatory Visit: Payer: 59 | Admitting: Allergy

## 2019-05-06 ENCOUNTER — Ambulatory Visit: Payer: 59 | Admitting: Physical Therapy

## 2019-05-11 ENCOUNTER — Ambulatory Visit: Payer: 59 | Admitting: Physical Therapy

## 2019-05-13 ENCOUNTER — Ambulatory Visit: Payer: 59 | Admitting: Physical Therapy

## 2019-05-13 ENCOUNTER — Ambulatory Visit (INDEPENDENT_AMBULATORY_CARE_PROVIDER_SITE_OTHER): Payer: 59

## 2019-05-13 DIAGNOSIS — J309 Allergic rhinitis, unspecified: Secondary | ICD-10-CM

## 2019-05-20 ENCOUNTER — Ambulatory Visit (INDEPENDENT_AMBULATORY_CARE_PROVIDER_SITE_OTHER): Payer: 59 | Admitting: *Deleted

## 2019-05-20 DIAGNOSIS — J309 Allergic rhinitis, unspecified: Secondary | ICD-10-CM

## 2019-06-10 ENCOUNTER — Ambulatory Visit (INDEPENDENT_AMBULATORY_CARE_PROVIDER_SITE_OTHER): Payer: 59 | Admitting: *Deleted

## 2019-06-10 DIAGNOSIS — J309 Allergic rhinitis, unspecified: Secondary | ICD-10-CM | POA: Diagnosis not present

## 2019-06-25 ENCOUNTER — Ambulatory Visit (INDEPENDENT_AMBULATORY_CARE_PROVIDER_SITE_OTHER): Payer: 59 | Admitting: *Deleted

## 2019-06-25 DIAGNOSIS — J309 Allergic rhinitis, unspecified: Secondary | ICD-10-CM | POA: Diagnosis not present

## 2019-07-08 ENCOUNTER — Ambulatory Visit (INDEPENDENT_AMBULATORY_CARE_PROVIDER_SITE_OTHER): Payer: 59

## 2019-07-08 DIAGNOSIS — J309 Allergic rhinitis, unspecified: Secondary | ICD-10-CM

## 2019-07-22 ENCOUNTER — Ambulatory Visit (INDEPENDENT_AMBULATORY_CARE_PROVIDER_SITE_OTHER): Payer: 59 | Admitting: *Deleted

## 2019-07-22 DIAGNOSIS — J309 Allergic rhinitis, unspecified: Secondary | ICD-10-CM | POA: Diagnosis not present

## 2019-08-06 ENCOUNTER — Ambulatory Visit (INDEPENDENT_AMBULATORY_CARE_PROVIDER_SITE_OTHER): Payer: 59 | Admitting: *Deleted

## 2019-08-06 DIAGNOSIS — J309 Allergic rhinitis, unspecified: Secondary | ICD-10-CM

## 2019-08-26 ENCOUNTER — Ambulatory Visit (INDEPENDENT_AMBULATORY_CARE_PROVIDER_SITE_OTHER): Payer: 59 | Admitting: *Deleted

## 2019-08-26 DIAGNOSIS — J309 Allergic rhinitis, unspecified: Secondary | ICD-10-CM

## 2019-09-16 ENCOUNTER — Ambulatory Visit (INDEPENDENT_AMBULATORY_CARE_PROVIDER_SITE_OTHER): Payer: 59

## 2019-09-16 DIAGNOSIS — J309 Allergic rhinitis, unspecified: Secondary | ICD-10-CM

## 2019-10-05 ENCOUNTER — Encounter: Payer: 59 | Admitting: Internal Medicine

## 2019-10-11 ENCOUNTER — Ambulatory Visit (INDEPENDENT_AMBULATORY_CARE_PROVIDER_SITE_OTHER): Payer: 59 | Admitting: Internal Medicine

## 2019-10-11 ENCOUNTER — Encounter: Payer: Self-pay | Admitting: Internal Medicine

## 2019-10-11 ENCOUNTER — Other Ambulatory Visit: Payer: Self-pay

## 2019-10-11 VITALS — BP 118/66 | HR 75 | Temp 97.6°F | Ht 64.0 in | Wt 132.2 lb

## 2019-10-11 DIAGNOSIS — Z0000 Encounter for general adult medical examination without abnormal findings: Secondary | ICD-10-CM

## 2019-10-11 DIAGNOSIS — Z13220 Encounter for screening for lipoid disorders: Secondary | ICD-10-CM

## 2019-10-11 LAB — CBC WITH DIFFERENTIAL/PLATELET
Basophils Absolute: 0 10*3/uL (ref 0.0–0.1)
Basophils Relative: 0.4 % (ref 0.0–3.0)
Eosinophils Absolute: 0.1 10*3/uL (ref 0.0–0.7)
Eosinophils Relative: 2.3 % (ref 0.0–5.0)
HCT: 38.4 % (ref 36.0–46.0)
Hemoglobin: 12.9 g/dL (ref 12.0–15.0)
Lymphocytes Relative: 56.8 % — ABNORMAL HIGH (ref 12.0–46.0)
Lymphs Abs: 1.6 10*3/uL (ref 0.7–4.0)
MCHC: 33.7 g/dL (ref 30.0–36.0)
MCV: 88.9 fl (ref 78.0–100.0)
Monocytes Absolute: 0.2 10*3/uL (ref 0.1–1.0)
Monocytes Relative: 6.8 % (ref 3.0–12.0)
Neutro Abs: 1 10*3/uL — ABNORMAL LOW (ref 1.4–7.7)
Neutrophils Relative %: 33.7 % — ABNORMAL LOW (ref 43.0–77.0)
Platelets: 197 10*3/uL (ref 150.0–400.0)
RBC: 4.32 Mil/uL (ref 3.87–5.11)
RDW: 11.8 % (ref 11.5–15.5)
WBC: 2.9 10*3/uL — ABNORMAL LOW (ref 4.0–10.5)

## 2019-10-11 LAB — TSH: TSH: 1.39 u[IU]/mL (ref 0.35–4.50)

## 2019-10-11 LAB — HEMOGLOBIN A1C: Hgb A1c MFr Bld: 5.7 % (ref 4.6–6.5)

## 2019-10-11 NOTE — Progress Notes (Signed)
This visit occurred during the SARS-CoV-2 public health emergency.  Safety protocols were in place, including screening questions prior to the visit, additional usage of staff PPE, and extensive cleaning of exam room while observing appropriate contact time as indicated for disinfecting solutions.    Chief Complaint  Patient presents with  . Annual Exam    Pt has no other concerns     HPI: Patient  Valerie [name]  54 y.o. comes in today for Preventive Health Care visit    Health Maintenance  Topic Date Due  . HIV Screening  02/07/1980  . PAP SMEAR-Modifier  01/07/2021  . MAMMOGRAM  02/17/2021  . TETANUS/TDAP  12/04/2024  . COLONOSCOPY  11/15/2025  . INFLUENZA VACCINE  Completed   Health Maintenance Review LIFESTYLE:  Exercise:  gained 5 #  During covid  Tobacco/ETS: no Alcohol:  One a month  Sugar beverages: coffee  Sleep: goal 5 hours  Falls asleep after work then up   Drug use: no HH of   2  Cat  Work:  [employer], commissioning full ppe    ROS:  GEN/ HEENT: No fever, significant weight changes sweats headaches vision problems hearing changes, CV/ PULM; No chest pain shortness of breath cough, syncope,edema  change in exercise tolerance. GI /GU: No adominal pain, vomiting, change in bowel habits. No blood in the stool. No significant GU symptoms. SKIN/HEME: ,no acute skin rashes suspicious lesions or bleeding. No lymphadenopathy, nodules, masses.  NEURO/ PSYCH:  No neurologic signs such as weakness numbness. No depression anxiety. IMM/ Allergy: No unusual infections.  Allergy .   REST of 12 system review negative except as per HPI   Past Medical History:  Diagnosis Date  . Allergy    cats  . Anemia    NOS  . 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 GERD (gastroesophageal reflux disease)   . HSV infection    labialis  . RECTAL BLEEDING 07/10/2007   Qualifier: Diagnosis of  By: Regis Bill MD, Standley Brooking 2008 colonoscopy [facility].     Past Surgical History:  Procedure  Laterality Date  . CYST REMOVAL HAND Right   . DILITATION & CURRETTAGE/HYSTROSCOPY WITH VERSAPOINT RESECTION  08/27/2012   Procedure: DILATATION & CURETTAGE/HYSTEROSCOPY WITH VERSAPOINT RESECTION;  Surgeon: Princess Bruins, MD;  Location: [facility];  Service: Gynecology;;  . LASIK  2009   mono vision  . TRIGGER FINGER RELEASE Right 06/02/2017   Procedure: RELEASE TRIGGER FINGER/A-1 PULLEY RIGHT RING TRIGGER RELEASE;  Surgeon: Leanora Cover, MD;  Location: [facility];  Service: Orthopedics;  Laterality: Right;    Family History  Problem Relation Age of Onset  . Diabetes Father   . Hyperlipidemia Father   . Heart disease Father   . Stroke Father   . Allergic rhinitis Mother   . Colon polyps Brother        elev TG  . Allergic rhinitis Brother   . Alcohol abuse Other   . Breast cancer Maternal Aunt        diagnosed in her 63's  . Stroke Maternal Grandmother   . Diabetes Maternal Grandmother   . Heart disease Maternal Grandmother   . Diabetes Maternal Grandfather   . Hyperlipidemia Brother        elevated triglycerides  . Liver disease Maternal Uncle        alcohol related  . Colon cancer Neg Hx       Outpatient Medications Prior to Visit  Medication  Sig Dispense Refill  . Ascorbic Acid (VITAMIN C PO) Take by mouth.    Marland Kitchen azelastine (ASTELIN) 0.1 % nasal spray Place 2 sprays into both nostrils 2 (two) times daily. Use in each nostril as directed 30 mL 5  . cetirizine (ZYRTEC) 10 MG tablet Take 10 mg by mouth daily as needed for allergies.    . cholecalciferol (VITAMIN D) 1000 UNITS tablet Take 1,000 Units by mouth daily.    . Cyanocobalamin (VITAMIN B-12 PO) Take by mouth.    . Multiple Vitamin (MULTIVITAMIN WITH MINERALS) TABS Take 1 tablet by mouth daily.    . multivitamin-lutein (OCUVITE-LUTEIN) CAPS capsule Take 1 capsule by mouth daily.    . Omega-3 Fatty Acids (FISH OIL) 1200 MG CAPS Take 1,200 mg by mouth daily.    . TURMERIC PO Take by mouth.    .  valACYclovir (VALTREX) 1000 MG tablet Take 2 TABLETS BY MOUTH AND REPEATIN 12 HOURS OR AS DIRECTED 30 tablet 4   No facility-administered medications prior to visit.     EXAM:  BP 118/66 (BP Location: Right Arm, Patient Position: Sitting, Cuff Size: Normal)   Pulse 75   Temp 97.6 F (36.4 C) (Temporal)   Ht 5\' 4"  (1.626 m)   Wt 132 lb 3.2 oz (60 kg)   LMP 11/28/2014   SpO2 99%   BMI 22.69 kg/m   Body mass index is 22.69 kg/m. Wt Readings from Last 3 Encounters:  10/11/19 132 lb 3.2 oz (60 kg)  04/09/19 127 lb 12.8 oz (58 kg)  03/03/19 126 lb (57.2 kg)    Physical Exam: Vital signs reviewed 03/05/19 is a well-developed well-nourished alert cooperative    who appearsr stated age in no acute distress.  HEENT: normocephalic atraumatic , Eyes: PERRL EOM's full, conjunctiva clear, glasses ., Ears: no deformity EAC's clear TMs with normal landmarks. Mouth:masked  NECK: supple without masses, thyromegaly or bruits. CHEST/PULM:  Clear to auscultation and percussion breath sounds equal no wheeze , rales or rhonchi. No chest wall deformities or tenderness. Breast: normal by inspection . No dimpling, discharge, masses, tenderness or discharge . CV: PMI is nondisplaced, S1 S2 no gallops, murmurs, rubs. Peripheral pulses are full without delay.No JVD .  ABDOMEN: Bowel sounds normal nontender  No guard or rebound, no hepato splenomegal no CVA tenderness.   Extremtities:  No clubbing cyanosis or edema, no acute joint swelling or redness no focal atrophy NEURO:  Oriented x3, cranial nerves 3-12 appear to be intact, no obvious focal weakness,gait within normal limits no abnormal reflexes or asymmetrical SKIN: No acute rashes normal turgor, color, no bruising or petechiae. PSYCH: Oriented, good eye contact, no obvious depression anxiety, cognition and judgment appear normal. LN: no cervical axillary inguinal adenopathy  Lab Results  Component Value Date   WBC 4.4 09/02/2018   HGB 13.3  09/02/2018   HCT 39.5 09/02/2018   PLT 206.0 09/02/2018   GLUCOSE 84 09/02/2018   CHOL 208 (H) 09/02/2018   TRIG 67.0 09/02/2018   HDL 76.60 09/02/2018   LDLCALC 118 (H) 09/02/2018   ALT 14 09/02/2018   AST 17 09/02/2018   NA 138 09/02/2018   K 4.1 09/02/2018   CL 102 09/02/2018   CREATININE 0.76 09/02/2018   BUN 16 09/02/2018   CO2 28 09/02/2018   TSH 1.43 09/02/2018    BP Readings from Last 3 Encounters:  10/11/19 118/66  04/09/19 120/78  03/03/19 124/82    Lab plan reviewed with patient   ASSESSMENT AND  PLAN:  Discussed the following assessment and plan:    ICD-10-CM   1. Visit for preventive health examination  Z00.00 CBC with Differential/Platelet    Basic metabolic panel    Hepatic function panel    Lipid panel    TSH    Hemoglobin A1c  2. Screening, lipid  Z13.220 CBC with Differential/Platelet    Basic metabolic panel    Hepatic function panel    Lipid panel    TSH    Hemoglobin A1c   Lab plan   Is fasting today  lsi addressed  Is well  And utd  Has had 2 doses of covid19  vaccine Patient Care Team: Madelin Headings, MD as PCP - General Sidney Ace, MD (Allergy) Genia Del, MD as Attending Physician (Obstetrics and Gynecology) Judi Saa, DO as Attending Physician (Sports Medicine) Patient Instructions  Work on getting more sleep . lifestyle intervention healthy eating and exercise .    Health Maintenance, Female Adopting a healthy lifestyle and getting preventive care are important in promoting health and wellness. Ask your health care provider about:  The right schedule for you to have regular tests and exams.  Things you can do on your own to prevent diseases and keep yourself healthy. What should I know about diet, weight, and exercise? Eat a healthy diet   Eat a diet that includes plenty of vegetables, fruits, low-fat dairy products, and lean protein.  Do not eat a lot of foods that are high in solid fats, added  sugars, or sodium. Maintain a healthy weight Body mass index (BMI) is used to identify weight problems. It estimates body fat based on height and weight. Your health care provider can help determine your BMI and help you achieve or maintain a healthy weight. Get regular exercise Get regular exercise. This is one of the most important things you can do for your health. Most adults should:  Exercise for at least 150 minutes each week. The exercise should increase your heart rate and make you sweat (moderate-intensity exercise).  Do strengthening exercises at least twice a week. This is in addition to the moderate-intensity exercise.  Spend less time sitting. Even light physical activity can be beneficial. Watch cholesterol and blood lipids Have your blood tested for lipids and cholesterol at 55 years of age, then have this test every 5 years. Have your cholesterol levels checked more often if:  Your lipid or cholesterol levels are high.  You are older than 55 years of age.  You are at high risk for heart disease. What should I know about cancer screening? Depending on your health history and family history, you may need to have cancer screening at various ages. This may include screening for:  Breast cancer.  Cervical cancer.  Colorectal cancer.  Skin cancer.  Lung cancer. What should I know about heart disease, diabetes, and high blood pressure? Blood pressure and heart disease  High blood pressure causes heart disease and increases the risk of stroke. This is more likely to develop in people who have high blood pressure readings, are of African descent, or are overweight.  Have your blood pressure checked: ? Every 3-5 years if you are 57-3 years of age. ? Every year if you are 33 years old or older. Diabetes Have regular diabetes screenings. This checks your fasting blood sugar level. Have the screening done:  Once every three years after age 33 if you are at a normal  weight and have a low risk  for diabetes.  More often and at a younger age if you are overweight or have a high risk for diabetes. What should I know about preventing infection? Hepatitis B If you have a higher risk for hepatitis B, you should be screened for this virus. Talk with your health care provider to find out if you are at risk for hepatitis B infection. Hepatitis C Testing is recommended for:  Everyone born from [birth year] through 1966.  Anyone with known risk factors for hepatitis C. Sexually transmitted infections (STIs)  Get screened for STIs, including gonorrhea and chlamydia, if: ? You are sexually active and are younger than 55 years of age. ? You are older than 55 years of age and your health care provider tells you that you are at risk for this type of infection. ? Your sexual activity has changed since you were last screened, and you are at increased risk for chlamydia or gonorrhea. Ask your health care provider if you are at risk.  Ask your health care provider about whether you are at high risk for HIV. Your health care provider may recommend a prescription medicine to help prevent HIV infection. If you choose to take medicine to prevent HIV, you should first get tested for HIV. You should then be tested every 3 months for as long as you are taking the medicine. Pregnancy  If you are about to stop having your period (premenopausal) and you may become pregnant, seek counseling before you get pregnant.  Take 400 to 800 micrograms (mcg) of folic acid every day if you become pregnant.  Ask for birth control (contraception) if you want to prevent pregnancy. Osteoporosis and menopause Osteoporosis is a disease in which the bones lose minerals and strength with aging. This can result in bone fractures. If you are 74 years old or older, or if you are at risk for osteoporosis and fractures, ask your health care provider if you should:  Be screened for bone loss.  Take a calcium  or vitamin D supplement to lower your risk of fractures.  Be given hormone replacement therapy (HRT) to treat symptoms of menopause. Follow these instructions at home: Lifestyle  Do not use any products that contain nicotine or tobacco, such as cigarettes, e-cigarettes, and chewing tobacco. If you need help quitting, ask your health care provider.  Do not use street drugs.  Do not share needles.  Ask your health care provider for help if you need support or information about quitting drugs. Alcohol use  Do not drink alcohol if: ? Your health care provider tells you not to drink. ? You are pregnant, may be pregnant, or are planning to become pregnant.  If you drink alcohol: ? Limit how much you use to 0-1 drink a day. ? Limit intake if you are breastfeeding.  Be aware of how much alcohol is in your drink. In the [place] one drink equals one 12 oz bottle of beer (355 mL), one 5 oz glass of wine (148 mL), or one 1 oz glass of hard liquor (44 mL). General instructions  Schedule regular health, dental, and eye exams.  Stay current with your vaccines.  Tell your health care provider if: ? You often feel depressed. ? You have ever been abused or do not feel safe at home. Summary  Adopting a healthy lifestyle and getting preventive care are important in promoting health and wellness.  Follow your health care provider's instructions about healthy diet, exercising, and getting tested or screened for diseases.  Follow your health care provider's instructions on monitoring your cholesterol and blood pressure. This information is not intended to replace advice given to you by your health care provider. Make sure you discuss any questions you have with your health care provider. Document Revised: 07/29/2018 Document Reviewed: 07/29/2018 Elsevier Patient Education  2020 [employer].    [place] K. Marvell Tamer M.D.

## 2019-10-11 NOTE — Patient Instructions (Signed)
Work on getting more sleep . lifestyle intervention healthy eating and exercise .    Health Maintenance, Female Adopting a healthy lifestyle and getting preventive care are important in promoting health and wellness. Ask your health care provider about:  The right schedule for you to have regular tests and exams.  Things you can do on your own to prevent diseases and keep yourself healthy. What should I know about diet, weight, and exercise? Eat a healthy diet   Eat a diet that includes plenty of vegetables, fruits, low-fat dairy products, and lean protein.  Do not eat a lot of foods that are high in solid fats, added sugars, or sodium. Maintain a healthy weight Body mass index (BMI) is used to identify weight problems. It estimates body fat based on height and weight. Your health care provider can help determine your BMI and help you achieve or maintain a healthy weight. Get regular exercise Get regular exercise. This is one of the most important things you can do for your health. Most adults should:  Exercise for at least 150 minutes each week. The exercise should increase your heart rate and make you sweat (moderate-intensity exercise).  Do strengthening exercises at least twice a week. This is in addition to the moderate-intensity exercise.  Spend less time sitting. Even light physical activity can be beneficial. Watch cholesterol and blood lipids Have your blood tested for lipids and cholesterol at 55 years of age, then have this test every 5 years. Have your cholesterol levels checked more often if:  Your lipid or cholesterol levels are high.  You are older than 55 years of age.  You are at high risk for heart disease. What should I know about cancer screening? Depending on your health history and family history, you may need to have cancer screening at various ages. This may include screening for:  Breast cancer.  Cervical cancer.  Colorectal cancer.  Skin  cancer.  Lung cancer. What should I know about heart disease, diabetes, and high blood pressure? Blood pressure and heart disease  High blood pressure causes heart disease and increases the risk of stroke. This is more likely to develop in people who have high blood pressure readings, are of African descent, or are overweight.  Have your blood pressure checked: ? Every 3-5 years if you are 92-30 years of age. ? Every year if you are 69 years old or older. Diabetes Have regular diabetes screenings. This checks your fasting blood sugar level. Have the screening done:  Once every three years after age 50 if you are at a normal weight and have a low risk for diabetes.  More often and at a younger age if you are overweight or have a high risk for diabetes. What should I know about preventing infection? Hepatitis B If you have a higher risk for hepatitis B, you should be screened for this virus. Talk with your health care provider to find out if you are at risk for hepatitis B infection. Hepatitis C Testing is recommended for:  Everyone born from [birth year] through 1966.  Anyone with known risk factors for hepatitis C. Sexually transmitted infections (STIs)  Get screened for STIs, including gonorrhea and chlamydia, if: ? You are sexually active and are younger than 55 years of age. ? You are older than 55 years of age and your health care provider tells you that you are at risk for this type of infection. ? Your sexual activity has changed since you were last screened,  and you are at increased risk for chlamydia or gonorrhea. Ask your health care provider if you are at risk.  Ask your health care provider about whether you are at high risk for HIV. Your health care provider may recommend a prescription medicine to help prevent HIV infection. If you choose to take medicine to prevent HIV, you should first get tested for HIV. You should then be tested every 3 months for as long as you are taking  the medicine. Pregnancy  If you are about to stop having your period (premenopausal) and you may become pregnant, seek counseling before you get pregnant.  Take 400 to 800 micrograms (mcg) of folic acid every day if you become pregnant.  Ask for birth control (contraception) if you want to prevent pregnancy. Osteoporosis and menopause Osteoporosis is a disease in which the bones lose minerals and strength with aging. This can result in bone fractures. If you are 65 years old or older, or if you are at risk for osteoporosis and fractures, ask your health care provider if you should:  Be screened for bone loss.  Take a calcium or vitamin D supplement to lower your risk of fractures.  Be given hormone replacement therapy (HRT) to treat symptoms of menopause. Follow these instructions at home: Lifestyle  Do not use any products that contain nicotine or tobacco, such as cigarettes, e-cigarettes, and chewing tobacco. If you need help quitting, ask your health care provider.  Do not use street drugs.  Do not share needles.  Ask your health care provider for help if you need support or information about quitting drugs. Alcohol use  Do not drink alcohol if: ? Your health care provider tells you not to drink. ? You are pregnant, may be pregnant, or are planning to become pregnant.  If you drink alcohol: ? Limit how much you use to 0-1 drink a day. ? Limit intake if you are breastfeeding.  Be aware of how much alcohol is in your drink. In the [place] one drink equals one 12 oz bottle of beer (355 mL), one 5 oz glass of wine (148 mL), or one 1 oz glass of hard liquor (44 mL). General instructions  Schedule regular health, dental, and eye exams.  Stay current with your vaccines.  Tell your health care provider if: ? You often feel depressed. ? You have ever been abused or do not feel safe at home. Summary  Adopting a healthy lifestyle and getting preventive care are important in  promoting health and wellness.  Follow your health care provider's instructions about healthy diet, exercising, and getting tested or screened for diseases.  Follow your health care provider's instructions on monitoring your cholesterol and blood pressure. This information is not intended to replace advice given to you by your health care provider. Make sure you discuss any questions you have with your health care provider. Document Revised: 07/29/2018 Document Reviewed: 07/29/2018 Elsevier Patient Education  2020 [employer] Inc[employer]  

## 2019-10-12 ENCOUNTER — Other Ambulatory Visit: Payer: Self-pay

## 2019-10-12 DIAGNOSIS — D72819 Decreased white blood cell count, unspecified: Secondary | ICD-10-CM

## 2019-10-12 LAB — HEPATIC FUNCTION PANEL
ALT: 19 U/L (ref 0–35)
AST: 19 U/L (ref 0–37)
Albumin: 4.3 g/dL (ref 3.5–5.2)
Alkaline Phosphatase: 69 U/L (ref 39–117)
Bilirubin, Direct: 0.1 mg/dL (ref 0.0–0.3)
Total Bilirubin: 0.4 mg/dL (ref 0.2–1.2)
Total Protein: 6.6 g/dL (ref 6.0–8.3)

## 2019-10-12 LAB — BASIC METABOLIC PANEL
BUN: 11 mg/dL (ref 6–23)
CO2: 28 mEq/L (ref 19–32)
Calcium: 9.3 mg/dL (ref 8.4–10.5)
Chloride: 104 mEq/L (ref 96–112)
Creatinine, Ser: 0.73 mg/dL (ref 0.40–1.20)
GFR: 82.87 mL/min (ref >60.00)
Glucose, Bld: 97 mg/dL (ref 70–99)
Potassium: 4.1 mEq/L (ref 3.5–5.1)
Sodium: 137 mEq/L (ref 135–145)

## 2019-10-12 LAB — LIPID PANEL
Cholesterol: 201 mg/dL — ABNORMAL HIGH (ref 0–200)
HDL: 75.2 mg/dL (ref >39.00)
LDL Cholesterol: 117 mg/dL — ABNORMAL HIGH (ref 0–99)
NonHDL: 126.24
Total CHOL/HDL Ratio: 3
Triglycerides: 46 mg/dL (ref 0.0–149.0)
VLDL: 9.2 mg/dL (ref 0.0–40.0)

## 2019-10-12 NOTE — Progress Notes (Signed)
Results are good   but WBC is lower than in past  but no anemia and platelets are normal  this could be a normal swing for you.  I suggest  recheck cbc diff in  1-2 months  ( no OV needed)  .  (Dx  Low wbc  leukopenia )  Valerie [name] please place order for this

## 2019-10-19 ENCOUNTER — Ambulatory Visit (INDEPENDENT_AMBULATORY_CARE_PROVIDER_SITE_OTHER): Payer: 59 | Admitting: *Deleted

## 2019-10-19 DIAGNOSIS — J309 Allergic rhinitis, unspecified: Secondary | ICD-10-CM | POA: Diagnosis not present

## 2019-10-28 ENCOUNTER — Ambulatory Visit (INDEPENDENT_AMBULATORY_CARE_PROVIDER_SITE_OTHER): Payer: 59

## 2019-10-28 DIAGNOSIS — J309 Allergic rhinitis, unspecified: Secondary | ICD-10-CM

## 2019-11-08 ENCOUNTER — Encounter: Payer: Self-pay | Admitting: Physical Medicine and Rehabilitation

## 2019-11-08 ENCOUNTER — Other Ambulatory Visit: Payer: Self-pay

## 2019-11-08 ENCOUNTER — Encounter: Payer: 59 | Attending: Physical Medicine and Rehabilitation | Admitting: Physical Medicine and Rehabilitation

## 2019-11-08 VITALS — BP 122/86 | HR 73 | Temp 97.7°F | Ht 63.5 in | Wt 130.0 lb

## 2019-11-08 DIAGNOSIS — M7918 Myalgia, other site: Secondary | ICD-10-CM | POA: Insufficient documentation

## 2019-11-08 DIAGNOSIS — G8929 Other chronic pain: Secondary | ICD-10-CM | POA: Diagnosis not present

## 2019-11-08 DIAGNOSIS — M546 Pain in thoracic spine: Secondary | ICD-10-CM | POA: Insufficient documentation

## 2019-11-08 DIAGNOSIS — M25511 Pain in right shoulder: Secondary | ICD-10-CM | POA: Insufficient documentation

## 2019-11-08 DIAGNOSIS — M25512 Pain in left shoulder: Secondary | ICD-10-CM | POA: Diagnosis not present

## 2019-11-08 MED ORDER — METAXALONE 800 MG PO TABS: 800.0000 mg | ORAL_TABLET | Freq: Three times a day (TID) | ORAL | 3 refills | Status: DC | PRN

## 2019-11-08 MED FILL — METAXALONE 800 MG TABS: 800 | 20 days supply | Qty: 60 | Fill #0

## 2019-11-08 NOTE — Progress Notes (Signed)
Subjective:    Patient ID: Valerie [name], female    DOB: 1965/09/24, 54 y.o.   MRN: [id number]  HPI   Has a lot of musculoskeletal pain in neck and back- h Dry needling and modalities-   Used to be upper shoulders and and going down traps now, going into rhomboids and and upper arms/lower deltoids.  This weekend was so bad, was hypersensitive and could barely tolerate touch.  Has gained 5 lbs in last year.   Whole back is tight, on L side greater than R.   Wants some trigger point injections.   Used gabapentin this weekend. Used 100 mg BID- was somewhat helpful.  No cognitive impairment with it. Uses Zyrtec at night Robaxin - uses some-takes the edge off- doesn't sedate her-   Flexeril knocks her out  Doesn't have a theracane- tennis balls not fine point enough.   Hasn't had Vit D checked in a long time.   Has some L5/S1 on R going on- still has that pain. Was seeing an ortho doctor- "just sciatica' pain.  Or Trochanteric bursa pain.  Can't lay on R side to sleep.  Flares up every so often   Pain Inventory Average Pain 3 Pain Right Now 6 My pain is tingling and aching  In the last 24 hours, has pain interfered with the following? General activity 0 Relation with others 0 Enjoyment of life 0 What TIME of day is your pain at its worst? varies Sleep (in general) Good  Pain is worse with: sitting and some activites Pain improves with: rest, heat/ice, therapy/exercise and pacing activities Relief from Meds: na  Mobility Do you have any goals in this area?  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  no  Physicians involved in your care Primary care Dr. Fabian Sharp   Family History  Problem Relation Age of Onset  . Diabetes Father   . Hyperlipidemia Father   . Heart disease Father   . Stroke Father   . Allergic rhinitis Mother   . Colon polyps Brother    elev TG  . Allergic rhinitis Brother   . Alcohol abuse Other   . Breast cancer Maternal Aunt        diagnosed in her 24's  . Stroke Maternal Grandmother   . Diabetes Maternal Grandmother   . Heart disease Maternal Grandmother   . Diabetes Maternal Grandfather   . Hyperlipidemia Brother        elevated triglycerides  . Liver disease Maternal Uncle        alcohol related  . Colon cancer Neg Hx    Social History   Socioeconomic History  . Marital status: Married    Spouse name: Not on file  . Number of children: Not on file  . Years of education: Not on file  . Highest education level: Not on file  Occupational History  . Occupation: PA    Employer: [employer]    Comment: Rehab  Tobacco Use  . Smoking status: Never Smoker  . Smokeless tobacco: Never Used  Substance and Sexual Activity  . Alcohol use: Yes    Alcohol/week: 0.0 standard drinks    Comment: ONCE A MONTH   . Drug use: No  . Sexual activity: Yes    Partners: Male    Comment: 1st intercourse- 54, partners- 1, married- 9 yrs   Other Topics Concern  .  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 Difficulty of Paying Living Expenses:   Food Insecurity:   . Worried About Programme researcher, broadcasting/film/video in the Last Year:   . Barista in the Last Year:   Transportation Needs:   . Freight forwarder (Medical):   Marland Kitchen Lack of Transportation (Non-Medical):   Physical Activity:   . Days of Exercise per Week:   . Minutes of Exercise per Session:   Stress:   . Feeling of Stress :   Social Connections:   . Frequency of Communication with Friends and Family:   . Frequency of Social Gatherings with Friends and Family:   . Attends Religious Services:   . Active Member of Clubs or Organizations:   . Attends  Banker Meetings:   Marland Kitchen Marital Status:    Past Surgical History:  Procedure Laterality Date  . CYST REMOVAL HAND Right   . DILITATION & CURRETTAGE/HYSTROSCOPY WITH VERSAPOINT RESECTION  08/27/2012   Procedure: DILATATION & CURETTAGE/HYSTEROSCOPY WITH VERSAPOINT RESECTION;  Surgeon: Genia Del, MD;  Location: [facility];  Service: Gynecology;;  . LASIK  2009   mono vision  . TRIGGER FINGER RELEASE Right 06/02/2017   Procedure: RELEASE TRIGGER FINGER/A-1 PULLEY RIGHT RING TRIGGER RELEASE;  Surgeon: Betha Loa, MD;  Location:  SURGERY CENTER;  Service: Orthopedics;  Laterality: Right;   Past Medical History:  Diagnosis Date  . Allergy    cats  . Anemia    NOS  . 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 GERD (gastroesophageal reflux disease)   . HSV infection    labialis  . RECTAL BLEEDING 07/10/2007   Qualifier: Diagnosis of  By: Fabian Sharp MD, Neta Mends 2008 colonoscopy [facility].    BP 122/86   Pulse 73   Temp 97.7 F (36.5 C)   Ht 5' 3.5" (1.613 m)   Wt 130 lb (59 kg)   LMP 11/28/2014   SpO2 100%   BMI 22.67 kg/m   Opioid Risk Score:   Fall Risk Score:  `1  Depression screen PHQ 2/9  Depression screen [facility] 2/9 10/11/2019 09/02/2018 08/13/2017 12/05/2014  Decreased Interest 0 0 0 0  Down, Depressed, Hopeless 0 0 0 0  PHQ - 2 Score 0 0 0 0    Review of Systems  Neurological:       Tingling and spasms  All other systems reviewed and are negative.      Objective:   Physical Exam Awake, alert, appropriate, sitting on table, NAD MS: strength 5/5 in UEs and LEs B/L Deltoid, biceps, triceps, WE, grip and finger abd HF, KE, KF, DF, and PF  Sensation: Sensation intact to light touch in UEs and LEs B/L  Tight muscles, esp L>>>R- on upper traps, splenius capitus, scalenes, rhomboids, and less so pecs, levators, and thoracic and lumbar paraspinals.         Assessment & Plan:    1. Check Vit D  2. Patient here for trigger point  injections for  Consent done and on chart.  Cleaned areas with alcohol and injected using a 27 gauge 1.5 inch needle  Injected  Using 1% Lidocaine with  no EPI  Upper traps B/L Levators B/L Posterior scalenes B/L Middle scalenes Splenius Capitus Pectoralis Major L only Rhomboids x3 B/L Infraspinatus Teres Major/minor Thoracic paraspinals Lumbar paraspinals B/L Other injections-    Patient's level of pain prior was- 5-6/10 Current level of pain after injections soreness injections- tightness is gone.   There was no bleeding or complications.  Patient was advised to drink a lot of water on day after injections to flush system Will have increased soreness for 12-48 hours after injections.  Can use Lidocaine patches the day AFTER injections Can use theracane on day of injections in places didn't inject Can use heating pad 4-6 hours AFTER injections  2. Skelaxin 800 mg TID- as needed for muscle tightness- failed robaxin and flexeril esp sedation- needs skelaxin at this time. No other choices for muscle tightness that actually work on muscle spasms; not spasticity  3. Magnesium 400 mg 2-3x/day- only side effect loose stools  4. Theracane- youtube has great videos   5. F/U 6 weeks

## 2019-11-08 NOTE — Patient Instructions (Signed)
1. Check Vit D  2. Patient here for trigger point injections for  Consent done and on chart.  Cleaned areas with alcohol and injected using a 27 gauge 1.5 inch needle  Injected  Using 1% Lidocaine with no EPI  Upper traps B/L Levators B/L Posterior scalenes B/L Middle scalenes Splenius Capitus Pectoralis Major L only Rhomboids x3 B/L Infraspinatus Teres Major/minor Thoracic paraspinals Lumbar paraspinals B/L Other injections-    Patient's level of pain prior was- 5-6/10 Current level of pain after injections soreness injections- tightness is gone.   There was no bleeding or complications.  Patient was advised to drink a lot of water on day after injections to flush system Will have increased soreness for 12-48 hours after injections.  Can use Lidocaine patches the day AFTER injections Can use theracane on day of injections in places didn't inject Can use heating pad 4-6 hours AFTER injections  2. Skelaxin 800 mg TID- as needed for muscle tightness  3. Magnesium 400 mg 2-3x/day- only side effect loose stools  4. Theracane- youtube has great videos- 2-4 minutes on each trigger point firm pressure.

## 2019-11-18 ENCOUNTER — Telehealth: Payer: 59 | Admitting: Internal Medicine

## 2019-11-23 ENCOUNTER — Ambulatory Visit (INDEPENDENT_AMBULATORY_CARE_PROVIDER_SITE_OTHER): Payer: 59 | Admitting: *Deleted

## 2019-11-23 DIAGNOSIS — J309 Allergic rhinitis, unspecified: Secondary | ICD-10-CM

## 2019-11-25 ENCOUNTER — Ambulatory Visit (INDEPENDENT_AMBULATORY_CARE_PROVIDER_SITE_OTHER): Payer: 59

## 2019-11-25 DIAGNOSIS — J309 Allergic rhinitis, unspecified: Secondary | ICD-10-CM | POA: Diagnosis not present

## 2019-12-09 ENCOUNTER — Ambulatory Visit (INDEPENDENT_AMBULATORY_CARE_PROVIDER_SITE_OTHER): Payer: 59

## 2019-12-09 DIAGNOSIS — J309 Allergic rhinitis, unspecified: Secondary | ICD-10-CM

## 2019-12-13 ENCOUNTER — Encounter: Payer: Self-pay | Admitting: Physical Medicine and Rehabilitation

## 2019-12-13 ENCOUNTER — Encounter: Payer: 59 | Attending: Physical Medicine and Rehabilitation | Admitting: Physical Medicine and Rehabilitation

## 2019-12-13 ENCOUNTER — Other Ambulatory Visit: Payer: Self-pay

## 2019-12-13 VITALS — BP 119/80 | HR 78 | Temp 98.5°F | Ht 63.5 in | Wt 131.0 lb

## 2019-12-13 DIAGNOSIS — M7918 Myalgia, other site: Secondary | ICD-10-CM | POA: Diagnosis not present

## 2019-12-13 DIAGNOSIS — M25511 Pain in right shoulder: Secondary | ICD-10-CM | POA: Diagnosis not present

## 2019-12-13 DIAGNOSIS — M546 Pain in thoracic spine: Secondary | ICD-10-CM | POA: Diagnosis not present

## 2019-12-13 DIAGNOSIS — G8929 Other chronic pain: Secondary | ICD-10-CM | POA: Insufficient documentation

## 2019-12-13 DIAGNOSIS — M25512 Pain in left shoulder: Secondary | ICD-10-CM | POA: Diagnosis not present

## 2019-12-13 DIAGNOSIS — G894 Chronic pain syndrome: Secondary | ICD-10-CM | POA: Diagnosis not present

## 2019-12-13 NOTE — Progress Notes (Signed)
    Patient here for trigger point injections for myofascial pain  Consent done and on chart.  Cleaned areas with alcohol and injected using a 27 gauge 1.5 inch needle  Injected 5cc Using 1% Lidocaine with no EPI  Upper traps B/L Levators B/L Posterior scalenes B/L Middle scalenes Splenius Capitus B/L Pectoralis Major B/L Rhomboids B/L Infraspinatus Teres Major/minor Thoracic paraspinals Lumbar paraspinals x3 on R Other injections-    Patient's level of pain prior was- 5/10 Current level of pain after injections is- 3/10  There was no bleeding or complications.  Patient was advised to drink a lot of water on day after injections to flush system Will have increased soreness for 12-48 hours after injections.  Can use Lidocaine patches the day AFTER injections Can use theracane on day of injections in places didn't inject Can use heating pad 4-6 hours AFTER injections  2. Vit Lab  3. F/U- 6 weeks

## 2019-12-16 ENCOUNTER — Ambulatory Visit (INDEPENDENT_AMBULATORY_CARE_PROVIDER_SITE_OTHER): Payer: 59

## 2019-12-16 DIAGNOSIS — J309 Allergic rhinitis, unspecified: Secondary | ICD-10-CM

## 2019-12-24 ENCOUNTER — Telehealth: Payer: Self-pay | Admitting: *Deleted

## 2019-12-24 NOTE — Telephone Encounter (Signed)
Patient gave verbal consent to order her allergen vials since her vials expire next month.

## 2019-12-27 ENCOUNTER — Ambulatory Visit (INDEPENDENT_AMBULATORY_CARE_PROVIDER_SITE_OTHER): Payer: 59

## 2019-12-27 DIAGNOSIS — J309 Allergic rhinitis, unspecified: Secondary | ICD-10-CM

## 2019-12-27 NOTE — Progress Notes (Signed)
VIALS EXP 12-26-20 

## 2019-12-28 DIAGNOSIS — J301 Allergic rhinitis due to pollen: Secondary | ICD-10-CM | POA: Diagnosis not present

## 2019-12-30 ENCOUNTER — Ambulatory Visit (INDEPENDENT_AMBULATORY_CARE_PROVIDER_SITE_OTHER): Payer: 59

## 2019-12-30 DIAGNOSIS — J309 Allergic rhinitis, unspecified: Secondary | ICD-10-CM | POA: Diagnosis not present

## 2020-01-03 ENCOUNTER — Ambulatory Visit (INDEPENDENT_AMBULATORY_CARE_PROVIDER_SITE_OTHER): Payer: 59

## 2020-01-03 DIAGNOSIS — J309 Allergic rhinitis, unspecified: Secondary | ICD-10-CM

## 2020-01-06 ENCOUNTER — Ambulatory Visit (INDEPENDENT_AMBULATORY_CARE_PROVIDER_SITE_OTHER): Payer: 59

## 2020-01-06 DIAGNOSIS — J309 Allergic rhinitis, unspecified: Secondary | ICD-10-CM

## 2020-01-18 ENCOUNTER — Ambulatory Visit (INDEPENDENT_AMBULATORY_CARE_PROVIDER_SITE_OTHER): Payer: 59

## 2020-01-18 DIAGNOSIS — J309 Allergic rhinitis, unspecified: Secondary | ICD-10-CM | POA: Diagnosis not present

## 2020-01-19 ENCOUNTER — Other Ambulatory Visit: Payer: Self-pay | Admitting: Internal Medicine

## 2020-01-19 DIAGNOSIS — Z1231 Encounter for screening mammogram for malignant neoplasm of breast: Secondary | ICD-10-CM

## 2020-01-24 ENCOUNTER — Ambulatory Visit: Payer: 59 | Admitting: Physical Medicine and Rehabilitation

## 2020-01-24 ENCOUNTER — Ambulatory Visit (INDEPENDENT_AMBULATORY_CARE_PROVIDER_SITE_OTHER): Payer: 59

## 2020-01-24 DIAGNOSIS — J309 Allergic rhinitis, unspecified: Secondary | ICD-10-CM

## 2020-01-31 ENCOUNTER — Ambulatory Visit: Payer: 59 | Admitting: Physical Medicine and Rehabilitation

## 2020-02-03 ENCOUNTER — Ambulatory Visit (INDEPENDENT_AMBULATORY_CARE_PROVIDER_SITE_OTHER): Payer: 59

## 2020-02-03 DIAGNOSIS — J309 Allergic rhinitis, unspecified: Secondary | ICD-10-CM

## 2020-02-11 ENCOUNTER — Ambulatory Visit (INDEPENDENT_AMBULATORY_CARE_PROVIDER_SITE_OTHER): Payer: 59

## 2020-02-11 DIAGNOSIS — J309 Allergic rhinitis, unspecified: Secondary | ICD-10-CM

## 2020-02-16 ENCOUNTER — Other Ambulatory Visit: Payer: Self-pay

## 2020-02-16 ENCOUNTER — Encounter: Payer: 59 | Attending: Physical Medicine and Rehabilitation | Admitting: Physical Medicine and Rehabilitation

## 2020-02-16 ENCOUNTER — Encounter: Payer: Self-pay | Admitting: Physical Medicine and Rehabilitation

## 2020-02-16 ENCOUNTER — Other Ambulatory Visit (HOSPITAL_COMMUNITY): Payer: Self-pay | Admitting: Physical Medicine and Rehabilitation

## 2020-02-16 VITALS — BP 125/84 | HR 76 | Temp 98.3°F | Ht 63.5 in | Wt 131.4 lb

## 2020-02-16 DIAGNOSIS — M7521 Bicipital tendinitis, right shoulder: Secondary | ICD-10-CM | POA: Diagnosis not present

## 2020-02-16 DIAGNOSIS — M7918 Myalgia, other site: Secondary | ICD-10-CM | POA: Insufficient documentation

## 2020-02-16 DIAGNOSIS — H524 Presbyopia: Secondary | ICD-10-CM | POA: Diagnosis not present

## 2020-02-16 DIAGNOSIS — M546 Pain in thoracic spine: Secondary | ICD-10-CM | POA: Diagnosis not present

## 2020-02-16 DIAGNOSIS — M25511 Pain in right shoulder: Secondary | ICD-10-CM | POA: Diagnosis not present

## 2020-02-16 DIAGNOSIS — M25512 Pain in left shoulder: Secondary | ICD-10-CM | POA: Diagnosis not present

## 2020-02-16 DIAGNOSIS — G8929 Other chronic pain: Secondary | ICD-10-CM | POA: Insufficient documentation

## 2020-02-16 DIAGNOSIS — M7541 Impingement syndrome of right shoulder: Secondary | ICD-10-CM | POA: Diagnosis not present

## 2020-02-16 DIAGNOSIS — M25562 Pain in left knee: Secondary | ICD-10-CM | POA: Diagnosis not present

## 2020-02-16 DIAGNOSIS — H5212 Myopia, left eye: Secondary | ICD-10-CM | POA: Diagnosis not present

## 2020-02-16 DIAGNOSIS — S99922A Unspecified injury of left foot, initial encounter: Secondary | ICD-10-CM | POA: Diagnosis not present

## 2020-02-16 DIAGNOSIS — H2513 Age-related nuclear cataract, bilateral: Secondary | ICD-10-CM | POA: Diagnosis not present

## 2020-02-16 MED ORDER — GABAPENTIN 100 MG PO CAPS: 100.0000 mg | ORAL_CAPSULE | Freq: Every day | ORAL | 5 refills | Status: DC

## 2020-02-16 MED ORDER — CYPROHEPTADINE HCL 4 MG PO TABS: 4.0000 mg | ORAL_TABLET | Freq: Two times a day (BID) | ORAL | 3 refills | Status: DC | PRN

## 2020-02-16 MED FILL — CYPROHEPTADINE HCL 4 MG TAB: 4 | 15 days supply | Qty: 30 | Fill #0

## 2020-02-16 MED FILL — GABAPENTIN 100 MG CAPSULE: 100 | 30 days supply | Qty: 30 | Fill #0

## 2020-02-16 NOTE — Patient Instructions (Signed)
1. Try Cyproheptadine- as needed- 4 mg BID prn - for headaches/allergies- take as needed   2. Gabapentin 100 mg QHS for sciatica.   3. Discussed how to use Skelaxin- can take up to 1200 mg 1x/day if needed for muscle tightness.  Might get more response to  theracane- if uses before theracane.  4. Patient here for trigger point injections for  Consent done and on chart.  Cleaned areas with alcohol and injected using a 27 gauge 1.5 inch needle  Injected  Using 1% Lidocaine with no EPI  Upper traps B/L Levators B/L Posterior scalenes B/L Middle scalenes Splenius Capitus  Pectoralis Major B/L Rhomboids B/L x3 Infraspinatus Teres Major/minor Thoracic paraspinals Lumbar paraspinals B/L Other injections-    Patient's level of pain prior was 4/10 Current level of pain after injections is- 2/10- not as tight.   There was no bleeding or complications.  Patient was advised to drink a lot of water on day after injections to flush system Will have increased soreness for 12-48 hours after injections.  Can use Lidocaine patches the day AFTER injections Can use theracane on day of injections in places didn't inject Can use heating pad 4-6 hours AFTER injections   5. F/U 6-8 weeks

## 2020-02-16 NOTE — Progress Notes (Signed)
°  Pt is a 54 yr old female with myofascial pain- secondary- here for f/u/trigger point injections.   R shoulder/anterior has been hurting more.  Has had a bicipital tendonitis injection in past- 4-5 yrs ago- was helpful.   6 weeks too far apart for injections. But- soreness is better this week- because 2 weeks ago, was the only PA at work, so working a lot.   R side is still pretty tight.  Not doing exercises. Using theracane a couple of times/week.   Has HA's- thinks more pressure related- allergies/sinuses.  Very rarely gets nauseated. Has light and sounds sensitivity- but not that bad.  Can usually hide when it gets bad.    Not doing HEP a lot lately.  Sciatica Sx's- would like Gabapentin 100 mg works for her- not as irritated, but it's still there.     Plan: 1. Try Cyproheptadine- as needed- 4 mg BID prn - for headaches/allergies- take as needed   2. Gabapentin 100 mg QHS for sciatica.   3. Discussed how to use Skelaxin- can take up to 1200 mg 1x/day if needed for muscle tightness.  Might get more response to  theracane- if uses before theracane.  4. Patient here for trigger point injections for  Consent done and on chart.  Cleaned areas with alcohol and injected using a 27 gauge 1.5 inch needle  Injected  Using 1% Lidocaine with no EPI  Upper traps B/L Levators B/L Posterior scalenes B/L Middle scalenes Splenius Capitus  Pectoralis Major B/L Rhomboids B/L x3 Infraspinatus Teres Major/minor Thoracic paraspinals Lumbar paraspinals B/L Other injections-    Patient's level of pain prior was 4/10 Current level of pain after injections is- 2/10- not as tight.   There was no bleeding or complications.  Patient was advised to drink a lot of water on day after injections to flush system Will have increased soreness for 12-48 hours after injections.  Can use Lidocaine patches the day AFTER injections Can use theracane on day of injections in places didn't  inject Can use heating pad 4-6 hours AFTER injections   5. F/U 6-8 weeks

## 2020-02-22 ENCOUNTER — Ambulatory Visit: Payer: 59

## 2020-02-24 ENCOUNTER — Ambulatory Visit (INDEPENDENT_AMBULATORY_CARE_PROVIDER_SITE_OTHER): Payer: 59

## 2020-02-24 DIAGNOSIS — J309 Allergic rhinitis, unspecified: Secondary | ICD-10-CM

## 2020-03-02 ENCOUNTER — Ambulatory Visit (INDEPENDENT_AMBULATORY_CARE_PROVIDER_SITE_OTHER): Payer: 59

## 2020-03-02 ENCOUNTER — Other Ambulatory Visit: Payer: Self-pay

## 2020-03-02 ENCOUNTER — Ambulatory Visit
Admission: RE | Admit: 2020-03-02 | Discharge: 2020-03-02 | Disposition: A | Discharge status: Home / Self Care | Payer: 59 | Source: Ambulatory Visit | Attending: Internal Medicine | Admitting: Internal Medicine

## 2020-03-02 DIAGNOSIS — Z1231 Encounter for screening mammogram for malignant neoplasm of breast: Secondary | ICD-10-CM

## 2020-03-02 DIAGNOSIS — J309 Allergic rhinitis, unspecified: Secondary | ICD-10-CM | POA: Diagnosis not present

## 2020-03-16 ENCOUNTER — Ambulatory Visit (INDEPENDENT_AMBULATORY_CARE_PROVIDER_SITE_OTHER): Payer: 59

## 2020-03-16 DIAGNOSIS — J309 Allergic rhinitis, unspecified: Secondary | ICD-10-CM

## 2020-03-29 ENCOUNTER — Ambulatory Visit (INDEPENDENT_AMBULATORY_CARE_PROVIDER_SITE_OTHER): Payer: 59

## 2020-03-29 DIAGNOSIS — J309 Allergic rhinitis, unspecified: Secondary | ICD-10-CM

## 2020-04-17 ENCOUNTER — Encounter: Payer: 59 | Admitting: Physical Medicine and Rehabilitation

## 2020-04-27 ENCOUNTER — Other Ambulatory Visit: Payer: Self-pay

## 2020-04-27 ENCOUNTER — Ambulatory Visit: Payer: 59 | Admitting: Allergy

## 2020-04-27 ENCOUNTER — Encounter: Payer: Self-pay | Admitting: Allergy

## 2020-04-27 VITALS — BP 106/62 | HR 72 | Temp 97.2°F | Resp 16 | Ht 63.5 in | Wt 132.8 lb

## 2020-04-27 DIAGNOSIS — J3089 Other allergic rhinitis: Secondary | ICD-10-CM | POA: Diagnosis not present

## 2020-04-27 MED ORDER — EPINEPHRINE 0.3 MG/0.3ML IJ SOAJ: 0.3000 mg | Freq: Once | INTRAMUSCULAR | 1 refills | Status: AC

## 2020-04-27 NOTE — Patient Instructions (Addendum)
Allergies   - continue appropriate allergen avoidance measures  - continue allergen immunotherapy (allergy shots) per protocol and have access to your epinephrine device   - recommend performing nasal saline rinse 3-7 days a week to help keep nose/sinuses clear  - continue daily antihistamine Zyrtec 10mg  or Xyzal 5mg  as needed  - use Astelin 1-2 sprays twice a day as needed nasal drainage   Follow-up 6 months or sooner if needed

## 2020-04-27 NOTE — Progress Notes (Signed)
Follow-up Note  RE: DENIS KOPPEL MRN: [id number] DOB: February 20, 1966 Date of Office Visit: 04/27/2020   History of present illness: Valerie [name] is a 54 y.o. female presenting today for follow-up of allergic rhinitis.  She was last seen in the office on 04/09/2019 by myself.  She states she has been doing well since her last visit without any major health changes, surgeries or hospitalizations.  Her immunotherapy is going well in the buildup phase.  She states she may have some itching on the next day after her injection but otherwise no large local or systemic reactions.  She does have access to an epinephrine device.  She does take Zyrtec daily.  She has not needed to use the Astelin nasal spray. She is fully vaccinated with the Pfizer vaccine and is awaiting a booster dose once available.  Review of systems: Review of Systems  Constitutional: Negative.   HENT: Negative.   Eyes: Negative.   Respiratory: Negative.   Cardiovascular: Negative.   Gastrointestinal: Negative.   Musculoskeletal: Negative.   Skin: Negative.   Neurological: Negative.     All other systems negative unless noted above in HPI  Past medical/social/surgical/family history have been reviewed and are unchanged unless specifically indicated below.  No changes  Medication List: Current Outpatient Medications  Medication Sig Dispense Refill  . Ascorbic Acid (VITAMIN C PO) Take by mouth.    . cetirizine (ZYRTEC) 10 MG tablet Take 10 mg by mouth daily as needed for allergies.    . cholecalciferol (VITAMIN D) 1000 UNITS tablet Take 1,000 Units by mouth daily.    . Cyanocobalamin (VITAMIN B-12 PO) Take by mouth.    . cyproheptadine (PERIACTIN) 4 MG tablet Take 1 tablet (4 mg total) by mouth 2 (two) times daily as needed for allergies (and headache symptoms). 30 tablet 3  . gabapentin (NEURONTIN) 100 MG capsule Take 1 capsule (100 mg total) by mouth at bedtime. 30 capsule 5  . Multiple Vitamin (MULTIVITAMIN WITH  MINERALS) TABS Take 1 tablet by mouth daily.    . multivitamin-lutein (OCUVITE-LUTEIN) CAPS capsule Take 1 capsule by mouth daily.    . Omega-3 Fatty Acids (FISH OIL) 1200 MG CAPS Take 1,200 mg by mouth daily.    . TURMERIC PO Take by mouth.    . valACYclovir (VALTREX) 1000 MG tablet Take 2 TABLETS BY MOUTH AND REPEATIN 12 HOURS OR AS DIRECTED 30 tablet 4   No current facility-administered medications for this visit.     Known medication allergies: Allergies  Allergen Reactions  . Augmentin [Amoxicillin-Pot Clavulanate] Diarrhea  . Ceftin [Cefuroxime Axetil] Other (See Comments)    Severe yeast infection  . Nsaids Nausea Only and Other (See Comments)    Abdominal pain / severe reflux/chest pain     Physical examination: Blood pressure 106/62, pulse 72, temperature (!) 97.2 F (36.2 C), temperature source Temporal, resp. rate 16, height 5' 3.5" (1.613 m), weight 132 lb 12.8 oz (60.2 kg), last menstrual period 11/28/2014, SpO2 98 %.  General: Alert, interactive, in no acute distress. HEENT: PERRLA, TMs pearly gray, turbinates mildly edematous without discharge R>L, post-pharynx non erythematous. Neck: Supple without lymphadenopathy. Lungs: Clear to auscultation without wheezing, rhonchi or rales. {no increased work of breathing. CV: Normal S1, S2 without murmurs. Abdomen: Nondistended, nontender. Skin: Warm and dry, without lesions or rashes. Extremities:  No clubbing, cyanosis or edema. Neuro:   Grossly intact.  Diagnositics/Labs: Allergy injection given today in left arm.   Assessment and plan:  Allergic rhinitis  - continue appropriate allergen avoidance measures  - continue allergen immunotherapy (allergy shots) per protocol and have access to your epinephrine device   - recommend performing nasal saline rinse 3-7 days a week to help keep nose/sinuses clear  - continue daily antihistamine Zyrtec 10mg  or Xyzal 5mg  as needed  - use Astelin 1-2 sprays twice a day as needed  nasal drainage   Follow-up 6 months or sooner if needed   I appreciate the opportunity to take part in Edward's care. Please do not hesitate to contact me with questions.  Sincerely,   , MD Allergy/Immunology [facility]

## 2020-04-28 MED FILL — EPINEPHRINE 0.3 MG AUTO-INJ: 0.3 | 2 days supply | Qty: 2 | Fill #0

## 2020-05-19 ENCOUNTER — Ambulatory Visit (INDEPENDENT_AMBULATORY_CARE_PROVIDER_SITE_OTHER): Payer: 59 | Admitting: *Deleted

## 2020-05-19 DIAGNOSIS — J309 Allergic rhinitis, unspecified: Secondary | ICD-10-CM | POA: Diagnosis not present

## 2020-05-23 ENCOUNTER — Ambulatory Visit (INDEPENDENT_AMBULATORY_CARE_PROVIDER_SITE_OTHER): Payer: 59 | Admitting: *Deleted

## 2020-05-23 DIAGNOSIS — J309 Allergic rhinitis, unspecified: Secondary | ICD-10-CM | POA: Diagnosis not present

## 2020-06-09 ENCOUNTER — Ambulatory Visit (INDEPENDENT_AMBULATORY_CARE_PROVIDER_SITE_OTHER): Payer: 59 | Admitting: *Deleted

## 2020-06-09 DIAGNOSIS — J309 Allergic rhinitis, unspecified: Secondary | ICD-10-CM

## 2020-07-18 ENCOUNTER — Ambulatory Visit (INDEPENDENT_AMBULATORY_CARE_PROVIDER_SITE_OTHER): Payer: 59

## 2020-07-18 DIAGNOSIS — J309 Allergic rhinitis, unspecified: Secondary | ICD-10-CM

## 2020-08-17 ENCOUNTER — Ambulatory Visit (INDEPENDENT_AMBULATORY_CARE_PROVIDER_SITE_OTHER): Payer: 59 | Admitting: *Deleted

## 2020-08-17 DIAGNOSIS — J309 Allergic rhinitis, unspecified: Secondary | ICD-10-CM

## 2020-08-30 ENCOUNTER — Ambulatory Visit (INDEPENDENT_AMBULATORY_CARE_PROVIDER_SITE_OTHER): Payer: 59 | Admitting: *Deleted

## 2020-08-30 DIAGNOSIS — J309 Allergic rhinitis, unspecified: Secondary | ICD-10-CM | POA: Diagnosis not present

## 2020-09-07 ENCOUNTER — Ambulatory Visit (INDEPENDENT_AMBULATORY_CARE_PROVIDER_SITE_OTHER): Payer: 59

## 2020-09-07 DIAGNOSIS — J309 Allergic rhinitis, unspecified: Secondary | ICD-10-CM | POA: Diagnosis not present

## 2020-09-21 ENCOUNTER — Ambulatory Visit (INDEPENDENT_AMBULATORY_CARE_PROVIDER_SITE_OTHER): Payer: 59

## 2020-09-21 DIAGNOSIS — J309 Allergic rhinitis, unspecified: Secondary | ICD-10-CM

## 2020-10-06 ENCOUNTER — Ambulatory Visit (INDEPENDENT_AMBULATORY_CARE_PROVIDER_SITE_OTHER): Payer: 59 | Admitting: *Deleted

## 2020-10-06 DIAGNOSIS — J309 Allergic rhinitis, unspecified: Secondary | ICD-10-CM | POA: Diagnosis not present

## 2020-10-16 ENCOUNTER — Telehealth: Payer: Self-pay

## 2020-10-16 ENCOUNTER — Other Ambulatory Visit (HOSPITAL_COMMUNITY): Payer: Self-pay | Admitting: Allergy

## 2020-10-16 ENCOUNTER — Ambulatory Visit (INDEPENDENT_AMBULATORY_CARE_PROVIDER_SITE_OTHER): Payer: 59

## 2020-10-16 DIAGNOSIS — J309 Allergic rhinitis, unspecified: Secondary | ICD-10-CM

## 2020-10-16 MED ORDER — EPINEPHRINE 0.3 MG/0.3ML IJ SOAJ: 0.3000 mg | Freq: Once | INTRAMUSCULAR | 2 refills | Status: AC

## 2020-10-16 MED FILL — EPINEPHRINE 0.3 MG AUTO-INJ: 0.3 | 30 days supply | Qty: 2 | Fill #0

## 2020-10-16 NOTE — Telephone Encounter (Signed)
Patient needs a refill on Epi

## 2020-10-19 MED FILL — GABAPENTIN 100 MG CAPSULE: 100 | 30 days supply | Qty: 30 | Fill #1

## 2020-10-31 ENCOUNTER — Ambulatory Visit (INDEPENDENT_AMBULATORY_CARE_PROVIDER_SITE_OTHER): Payer: 59 | Admitting: *Deleted

## 2020-10-31 ENCOUNTER — Telehealth: Payer: Self-pay | Admitting: *Deleted

## 2020-10-31 DIAGNOSIS — J309 Allergic rhinitis, unspecified: Secondary | ICD-10-CM

## 2020-10-31 NOTE — Telephone Encounter (Signed)
Error

## 2020-11-01 ENCOUNTER — Encounter (INDEPENDENT_AMBULATORY_CARE_PROVIDER_SITE_OTHER): Payer: Self-pay

## 2020-11-10 ENCOUNTER — Ambulatory Visit (INDEPENDENT_AMBULATORY_CARE_PROVIDER_SITE_OTHER): Payer: 59 | Admitting: *Deleted

## 2020-11-10 DIAGNOSIS — J309 Allergic rhinitis, unspecified: Secondary | ICD-10-CM | POA: Diagnosis not present

## 2020-11-15 ENCOUNTER — Encounter: Payer: 59 | Admitting: Internal Medicine

## 2020-11-16 ENCOUNTER — Ambulatory Visit (INDEPENDENT_AMBULATORY_CARE_PROVIDER_SITE_OTHER): Payer: 59 | Admitting: *Deleted

## 2020-11-16 ENCOUNTER — Telehealth: Payer: Self-pay | Admitting: *Deleted

## 2020-11-16 DIAGNOSIS — J309 Allergic rhinitis, unspecified: Secondary | ICD-10-CM

## 2020-11-16 MED FILL — GABAPENTIN 100 MG CAPSULE: 100 | 30 days supply | Qty: 30 | Fill #2

## 2020-11-16 NOTE — Telephone Encounter (Signed)
I am okay with that.

## 2020-11-16 NOTE — Telephone Encounter (Signed)
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 She is on Blue but almost finished and about to start Gold. She is wondering if she may be able to come in 2 times weekly to build up quicker. Please advise.

## 2020-11-17 ENCOUNTER — Encounter: Payer: Self-pay | Admitting: *Deleted

## 2020-11-17 NOTE — Telephone Encounter (Signed)
Flowsheet has been updated. MyChart message has been sent to patient advising.

## 2020-11-20 ENCOUNTER — Other Ambulatory Visit: Payer: Self-pay | Admitting: *Deleted

## 2020-11-20 ENCOUNTER — Other Ambulatory Visit (HOSPITAL_COMMUNITY): Payer: Self-pay

## 2020-11-20 DIAGNOSIS — J3081 Allergic rhinitis due to animal (cat) (dog) hair and dander: Secondary | ICD-10-CM | POA: Diagnosis not present

## 2020-11-20 MED ORDER — GABAPENTIN 100 MG PO CAPS
100.0000 mg | ORAL_CAPSULE | Freq: Every day | ORAL | 5 refills | Status: DC
  Filled 2020-11-20: qty 30, 30d supply, fill #0

## 2020-11-20 NOTE — Progress Notes (Signed)
VIALS EXP 11-20-21 

## 2020-11-21 ENCOUNTER — Ambulatory Visit (INDEPENDENT_AMBULATORY_CARE_PROVIDER_SITE_OTHER): Payer: 59 | Admitting: *Deleted

## 2020-11-21 DIAGNOSIS — J309 Allergic rhinitis, unspecified: Secondary | ICD-10-CM

## 2020-11-24 ENCOUNTER — Ambulatory Visit (INDEPENDENT_AMBULATORY_CARE_PROVIDER_SITE_OTHER): Payer: 59

## 2020-11-24 DIAGNOSIS — J309 Allergic rhinitis, unspecified: Secondary | ICD-10-CM

## 2020-11-28 ENCOUNTER — Ambulatory Visit (INDEPENDENT_AMBULATORY_CARE_PROVIDER_SITE_OTHER): Payer: 59 | Admitting: *Deleted

## 2020-11-28 DIAGNOSIS — J309 Allergic rhinitis, unspecified: Secondary | ICD-10-CM | POA: Diagnosis not present

## 2020-11-30 ENCOUNTER — Ambulatory Visit (INDEPENDENT_AMBULATORY_CARE_PROVIDER_SITE_OTHER): Payer: 59

## 2020-11-30 DIAGNOSIS — J309 Allergic rhinitis, unspecified: Secondary | ICD-10-CM

## 2020-12-04 ENCOUNTER — Telehealth: Payer: Self-pay

## 2020-12-04 NOTE — Telephone Encounter (Signed)
Called patient to let her know that it was fine for you to come in 2 times weekly to get your injections as long as they are 48 hours apart. I left a message for her to call back with any questions or concerns.

## 2020-12-05 ENCOUNTER — Other Ambulatory Visit: Payer: Self-pay | Admitting: Internal Medicine

## 2020-12-05 ENCOUNTER — Other Ambulatory Visit (HOSPITAL_COMMUNITY): Payer: Self-pay

## 2020-12-05 MED ORDER — VALACYCLOVIR HCL 1 G PO TABS
ORAL_TABLET | ORAL | 0 refills | Status: DC
  Filled 2020-12-05: qty 30, 7d supply, fill #0

## 2020-12-10 NOTE — Progress Notes (Signed)
Chief Complaint  Patient presents with  . Annual Exam    HPI: Patient  Valerie [name]  55 y.o. comes in today for Preventive Health Care visit  poorere eating  Since  Work and  M in Radiographer, therapeutic I medical condition .    1 - 2 x per month    Is helped by  Massage therapy for  mf release.  Health Maintenance  Topic Date Due  . Hepatitis C Screening  Never done  . HIV Screening  Never done  . PAP SMEAR-Modifier  01/07/2021  . INFLUENZA VACCINE  03/19/2021  . MAMMOGRAM  03/02/2022  . TETANUS/TDAP  12/04/2024  . COLONOSCOPY (Pts 45-37yrs Insurance coverage will need to be confirmed)  11/15/2025  . HPV VACCINES  Aged Out   Health Maintenance Review LIFESTYLE:  Exercise:  No enough less  Tobacco/ETS: no Alcohol: no Sugar beverages:no ocass juice  Sleep: limited 4-5  Hours  Drug use: no HH of  3  Work: lots of hours    ROS:  GEN/ HEENT: No fever, significant weight changes sweats headaches vision problems hearing changes, CV/ PULM; No chest pain shortness of breath cough, syncope,edema  change in exercise tolerance. GI /GU: No adominal pain, vomiting, change in bowel habits. No blood in the stool. No significant GU symptoms. SKIN/HEME: ,no acute skin rashes suspicious lesions or bleeding. No lymphadenopathy, nodules, masses.  NEURO/ PSYCH:  No neurologic signs such as weakness numbness. No depression anxiety. IMM/ Allergy: No unusual infections.  Allergy .   REST of 12 system review negative except as per HPI   Past Medical History:  Diagnosis Date  . Allergy    cats  . Anemia    NOS  . 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 GERD (gastroesophageal reflux disease)   . HSV infection    labialis  . RECTAL BLEEDING 07/10/2007   Qualifier: Diagnosis of  By: Fabian Sharp MD, Neta Mends 2008 colonoscopy [facility].     Past Surgical History:  Procedure Laterality Date  . CYST REMOVAL HAND Right   . DILITATION & CURRETTAGE/HYSTROSCOPY WITH VERSAPOINT RESECTION  08/27/2012    Procedure: DILATATION & CURETTAGE/HYSTEROSCOPY WITH VERSAPOINT RESECTION;  Surgeon: Genia Del, MD;  Location: [facility];  Service: Gynecology;;  . LASIK  2009   mono vision  . TRIGGER FINGER RELEASE Right 06/02/2017   Procedure: RELEASE TRIGGER FINGER/A-1 PULLEY RIGHT RING TRIGGER RELEASE;  Surgeon: Betha Loa, MD;  Location: [facility];  Service: Orthopedics;  Laterality: Right;    Family History  Problem Relation Age of Onset  . Diabetes Father   . Hyperlipidemia Father   . Heart disease Father   . Stroke Father   . Allergic rhinitis Mother   . Colon polyps Brother        elev TG  . Allergic rhinitis Brother   . Alcohol abuse Other   . Breast cancer Maternal Aunt        diagnosed in her 84's  . Stroke Maternal Grandmother   . Diabetes Maternal Grandmother   . Heart disease Maternal Grandmother   . Diabetes Maternal Grandfather   . Hyperlipidemia Brother        elevated triglycerides  . Liver disease Maternal Uncle        alcohol related  . Colon cancer Neg Hx     Social History   Socioeconomic History  . Marital status: Married    Spouse name: Not on file  . Number  of children: Not on file  . Years of education: Not on file  . Highest education level: Not on file  Occupational History  . Occupation: PA    Employer: [employer]    Comment: Rehab  Tobacco Use  . Smoking status: Never Smoker  . Smokeless tobacco: Never Used  Vaping Use  . Vaping Use: Never used  Substance and Sexual Activity  . Alcohol use: Yes    Alcohol/week: 0.0 standard drinks    Comment: ONCE A MONTH   . Drug use: No  . Sexual activity: Yes    Partners: Male    Comment: 1st intercourse- 85, partners- 1, married- 9 yrs   Other Topics Concern  . 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 Ascorbic Acid (VITAMIN C PO) Take by mouth.    Marland Kitchen BIOTIN PO Take by mouth.    . cetirizine (ZYRTEC) 10 MG tablet Take 10 mg by mouth daily as needed for allergies.    . cholecalciferol (VITAMIN D) 1000 UNITS tablet Take 1,000 Units by mouth daily.    . Cyanocobalamin (VITAMIN B-12 PO) Take by mouth.    . EPINEPHrine 0.3 mg/0.3 mL IJ SOAJ injection INJECT 0.3 MG (1 SYRINGE) INTO THE MUSCLE ONCE FOR 1 DOSE AS NEEDED FOR LIFE-THREATENING ALLERGIC REACTIONS 2 each 2  . gabapentin (NEURONTIN) 100 MG capsule Take 1 capsule by mouth at bedtime. 30 capsule 5  . gabapentin (NEURONTIN) 100 MG capsule TAKE 1 CAPSULE (100 MG TOTAL) BY MOUTH AT BEDTIME. 30 capsule 5  . Multiple Vitamin (MULTIVITAMIN WITH MINERALS) TABS Take 1 tablet by mouth as needed.    . multivitamin-lutein (OCUVITE-LUTEIN) CAPS capsule Take 1 capsule by mouth daily.    . Omega-3 Fatty Acids (FISH OIL) 1200 MG CAPS Take 1,200 mg by mouth daily.    . TURMERIC PO Take by mouth.    . valACYclovir (VALTREX) 1000 MG tablet Take 2 TABLETS BY MOUTH AND REPEAT IN 12 HOURS OR AS DIRECTED (Patient taking differently: Take 2 TABLETS BY MOUTH AND REPEAT IN 12 HOURS OR AS DIRECTED) 30 tablet 0  . cyproheptadine (PERIACTIN) 4 MG tablet Take 1 tablet (4 mg total) by mouth 2 (two) times daily as needed for allergies (and headache symptoms). 30 tablet 3   No facility-administered medications prior to visit.     EXAM:  BP 118/74 (BP Location: Left Arm, Patient Position: Sitting, Cuff Size: Normal)   Pulse 66   Temp 98.4 F (36.9 C) (Oral)   Ht 5' 3.5" (1.613 m)   Wt 128 lb 12.8 oz (58.4 kg)   LMP 11/28/2014   SpO2 98%   BMI 22.46 kg/m   Body mass index is 22.46 kg/m. Wt  Readings from Last 3 Encounters:  12/11/20 128 lb 12.8 oz (58.4 kg)  04/27/20 132 lb 12.8 oz (60.2 kg)  02/16/20 131 lb 6.4 oz (59.6 kg)    Physical Exam: Vital signs reviewed [id number]  is a well-developed well-nourished alert cooperative    who appearsr stated age in no acute distress.  HEENT: normocephalic atraumatic , Eyes: PERRL EOM's full, conjunctiva clear,, Ears: no deformity EAC's clear TMs with normal landmarks. Mouth: masked  NECK: supple without masses, thyromegaly or bruits. CHEST/PULM:  Clear to auscultation and percussion breath sounds equal no wheeze , rales or rhonchi. No chest wall deformities or tenderness. Breast: normal by inspection . No dimpling, discharge, masses, tenderness or discharge . CV: PMI is nondisplaced, S1 S2 no gallops, murmurs, rubs. Peripheral pulses are full without delay.No JVD .  ABDOMEN: Bowel sounds normal nontender  No guard or rebound, no hepato splenomegal no CVA tenderness. Extremtities:  No clubbing cyanosis or edema, no acute joint swelling or redness no focal atrophy NEURO:  Oriented x3, cranial nerves 3-12 appear to be intact, no obvious focal weakness,gait within normal limits no abnormal reflexes or asymmetrical SKIN: No acute rashes normal turgor, color, no bruising or petechiae. PSYCH: Oriented, good eye contact, no obvious depression anxiety, cognition and judgment appear normal. LN: no cervical axillary inguinal adenopathy  Lab Results  Component Value Date   WBC 2.9 (L) 10/11/2019   HGB 12.9 10/11/2019   HCT 38.4 10/11/2019   PLT 197.0 10/11/2019   GLUCOSE 97 10/11/2019   CHOL 201 (H) 10/11/2019   TRIG 46.0 10/11/2019   HDL 75.20 10/11/2019   LDLCALC 117 (H) 10/11/2019   ALT 19 10/11/2019   AST 19 10/11/2019   NA 137 10/11/2019   K 4.1 10/11/2019   CL 104 10/11/2019   CREATININE 0.73 10/11/2019   BUN 11 10/11/2019   CO2 28 10/11/2019   TSH 1.39 10/11/2019   HGBA1C 5.7 10/11/2019    BP Readings from Last 3 Encounters:   12/11/20 118/74  04/27/20 106/62  02/16/20 125/84    Lab plan reviewed with patient   ASSESSMENT AND PLAN:  Discussed the following assessment and plan:    ICD-10-CM   1. Visit for preventive health examination  Z00.00 Basic metabolic panel    CBC with Differential/Platelet    Hepatic function panel    Lipid panel    TSH    Hemoglobin A1c    Hemoglobin A1c    TSH    Lipid panel    Hepatic function panel    CBC with Differential/Platelet    Basic metabolic panel  2. Screening, lipid  Z13.220 Basic metabolic panel    CBC with Differential/Platelet    Hepatic function panel    Lipid panel    TSH    Hemoglobin A1c    Hemoglobin A1c    TSH    Lipid panel    Hepatic function panel    CBC with Differential/Platelet    Basic metabolic panel  3. Leukopenia, unspecified type  D72.819 Basic metabolic panel    CBC with Differential/Platelet    Hepatic function panel    Lipid panel    TSH    Hemoglobin A1c    Hemoglobin A1c    TSH    Lipid panel    Hepatic function panel    CBC with Differential/Platelet    Basic metabolic panel   stable nl exam and no sx   4. Medication management  Z79.899 Basic metabolic panel    CBC with Differential/Platelet    Hepatic function panel    Lipid panel    TSH    Hemoglobin A1c    Hemoglobin A1c    TSH    Lipid panel  Hepatic function panel    CBC with Differential/Platelet    Basic metabolic panel  5. Myofascial muscle pain  M79.18 Basic metabolic panel    CBC with Differential/Platelet    Hepatic function panel    Lipid panel    TSH    Hemoglobin A1c    Hemoglobin A1c    TSH    Lipid panel    Hepatic function panel    CBC with Differential/Platelet    Basic metabolic panel   managed  over time   massage therapy helpful  will plan on  letter to  Advise massage therapy for medical reasons   .Marland Kitchen  Request via my chart   Return in about 1 year (around 12/11/2021) for depending on results.  Patient Care Team: Dennisha Mouser,  Neta Mends, MD as PCP - General Sidney Ace, MD (Allergy) Genia Del, MD as Attending Physician (Obstetrics and Gynecology) Judi Saa, DO as Attending Physician (Sports Medicine) Patient Instructions   Send me  Info on massage therapy  letter.  Lab today .    Health Maintenance, Female Adopting a healthy lifestyle and getting preventive care are important in promoting health and wellness. Ask your health care provider about:  The right schedule for you to have regular tests and exams.  Things you can do on your own to prevent diseases and keep yourself healthy. What should I know about diet, weight, and exercise? Eat a healthy diet  Eat a diet that includes plenty of vegetables, fruits, low-fat dairy products, and lean protein.  Do not eat a lot of foods that are high in solid fats, added sugars, or sodium.   Maintain a healthy weight Body mass index (BMI) is used to identify weight problems. It estimates body fat based on height and weight. Your health care provider can help determine your BMI and help you achieve or maintain a healthy weight. Get regular exercise Get regular exercise. This is one of the most important things you can do for your health. Most adults should:  Exercise for at least 150 minutes each week. The exercise should increase your heart rate and make you sweat (moderate-intensity exercise).  Do strengthening exercises at least twice a week. This is in addition to the moderate-intensity exercise.  Spend less time sitting. Even light physical activity can be beneficial. Watch cholesterol and blood lipids Have your blood tested for lipids and cholesterol at 56 years of age, then have this test every 5 years. Have your cholesterol levels checked more often if:  Your lipid or cholesterol levels are high.  You are older than 56 years of age.  You are at high risk for heart disease. What should I know about cancer screening? Depending on your  health history and family history, you may need to have cancer screening at various ages. This may include screening for:  Breast cancer.  Cervical cancer.  Colorectal cancer.  Skin cancer.  Lung cancer. What should I know about heart disease, diabetes, and high blood pressure? Blood pressure and heart disease  High blood pressure causes heart disease and increases the risk of stroke. This is more likely to develop in people who have high blood pressure readings, are of African descent, or are overweight.  Have your blood pressure checked: ? Every 3-5 years if you are 45-91 years of age. ? Every year if you are 18 years old or older. Diabetes Have regular diabetes screenings. This checks your fasting blood sugar level. Have the screening done:  Once every three years after age 75 if you are at a normal weight and have a low risk for diabetes.  More often and at a younger age if you are overweight or have a high risk for diabetes. What should I know about preventing infection? Hepatitis B If you have a higher risk for hepatitis B, you should be screened for this virus. Talk with your health care provider to find out if you are at risk for hepatitis B infection. Hepatitis C Testing is recommended for:  Everyone born from [birth year] through 1966.  Anyone with known risk factors for hepatitis C. Sexually transmitted infections (STIs)  Get screened for STIs, including gonorrhea and chlamydia, if: ? You are sexually active and are younger than 56 years of age. ? You are older than 56 years of age and your health care provider tells you that you are at risk for this type of infection. ? Your sexual activity has changed since you were last screened, and you are at increased risk for chlamydia or gonorrhea. Ask your health care provider if you are at risk.  Ask your health care provider about whether you are at high risk for HIV. Your health care provider may recommend a prescription  medicine to help prevent HIV infection. If you choose to take medicine to prevent HIV, you should first get tested for HIV. You should then be tested every 3 months for as long as you are taking the medicine. Pregnancy  If you are about to stop having your period (premenopausal) and you may become pregnant, seek counseling before you get pregnant.  Take 400 to 800 micrograms (mcg) of folic acid every day if you become pregnant.  Ask for birth control (contraception) if you want to prevent pregnancy. Osteoporosis and menopause Osteoporosis is a disease in which the bones lose minerals and strength with aging. This can result in bone fractures. If you are 65 years old or older, or if you are at risk for osteoporosis and fractures, ask your health care provider if you should:  Be screened for bone loss.  Take a calcium or vitamin D supplement to lower your risk of fractures.  Be given hormone replacement therapy (HRT) to treat symptoms of menopause. Follow these instructions at home: Lifestyle  Do not use any products that contain nicotine or tobacco, such as cigarettes, e-cigarettes, and chewing tobacco. If you need help quitting, ask your health care provider.  Do not use street drugs.  Do not share needles.  Ask your health care provider for help if you need support or information about quitting drugs. Alcohol use  Do not drink alcohol if: ? Your health care provider tells you not to drink. ? You are pregnant, may be pregnant, or are planning to become pregnant.  If you drink alcohol: ? Limit how much you use to 0-1 drink a day. ? Limit intake if you are breastfeeding.  Be aware of how much alcohol is in your drink. In the [place] one drink equals one 12 oz bottle of beer (355 mL), one 5 oz glass of wine (148 mL), or one 1 oz glass of hard liquor (44 mL). General instructions  Schedule regular health, dental, and eye exams.  Stay current with your vaccines.  Tell your health  care provider if: ? You often feel depressed. ? You have ever been abused or do not feel safe at home. Summary  Adopting a healthy lifestyle and getting preventive care are important in promoting health  and wellness.  Follow your health care provider's instructions about healthy diet, exercising, and getting tested or screened for diseases.  Follow your health care provider's instructions on monitoring your cholesterol and blood pressure. This information is not intended to replace advice given to you by your health care provider. Make sure you discuss any questions you have with your health care provider. Document Revised: 07/29/2018 Document Reviewed: 07/29/2018 Elsevier Patient Education  2021 [employer]    Rose HillWanda K. Olanrewaju Osborn M.D.

## 2020-12-11 ENCOUNTER — Ambulatory Visit (INDEPENDENT_AMBULATORY_CARE_PROVIDER_SITE_OTHER): Payer: 59 | Admitting: Internal Medicine

## 2020-12-11 ENCOUNTER — Other Ambulatory Visit: Payer: Self-pay

## 2020-12-11 ENCOUNTER — Encounter: Payer: Self-pay | Admitting: Internal Medicine

## 2020-12-11 VITALS — BP 118/74 | HR 66 | Temp 98.4°F | Ht 63.5 in | Wt 128.8 lb

## 2020-12-11 DIAGNOSIS — Z79899 Other long term (current) drug therapy: Secondary | ICD-10-CM

## 2020-12-11 DIAGNOSIS — Z0000 Encounter for general adult medical examination without abnormal findings: Secondary | ICD-10-CM | POA: Diagnosis not present

## 2020-12-11 DIAGNOSIS — M7918 Myalgia, other site: Secondary | ICD-10-CM

## 2020-12-11 DIAGNOSIS — D72819 Decreased white blood cell count, unspecified: Secondary | ICD-10-CM

## 2020-12-11 DIAGNOSIS — Z13220 Encounter for screening for lipoid disorders: Secondary | ICD-10-CM

## 2020-12-11 LAB — CBC WITH DIFFERENTIAL/PLATELET
Basophils Absolute: 0 10*3/uL (ref 0.0–0.1)
Basophils Relative: 0.5 % (ref 0.0–3.0)
Eosinophils Absolute: 0.1 10*3/uL (ref 0.0–0.7)
Eosinophils Relative: 2.9 % (ref 0.0–5.0)
HCT: 37.4 % (ref 36.0–46.0)
Hemoglobin: 12.8 g/dL (ref 12.0–15.0)
Lymphocytes Relative: 49 % — ABNORMAL HIGH (ref 12.0–46.0)
Lymphs Abs: 1.6 10*3/uL (ref 0.7–4.0)
MCHC: 34.1 g/dL (ref 30.0–36.0)
MCV: 87.8 fl (ref 78.0–100.0)
Monocytes Absolute: 0.2 10*3/uL (ref 0.1–1.0)
Monocytes Relative: 6.6 % (ref 3.0–12.0)
Neutro Abs: 1.4 10*3/uL (ref 1.4–7.7)
Neutrophils Relative %: 41 % — ABNORMAL LOW (ref 43.0–77.0)
Platelets: 199 10*3/uL (ref 150.0–400.0)
RBC: 4.26 Mil/uL (ref 3.87–5.11)
RDW: 12.3 % (ref 11.5–15.5)
WBC: 3.3 10*3/uL — ABNORMAL LOW (ref 4.0–10.5)

## 2020-12-11 LAB — BASIC METABOLIC PANEL
BUN: 16 mg/dL (ref 6–23)
CO2: 27 mEq/L (ref 19–32)
Calcium: 9.1 mg/dL (ref 8.4–10.5)
Chloride: 105 mEq/L (ref 96–112)
Creatinine, Ser: 0.7 mg/dL (ref 0.40–1.20)
GFR: 97.07 mL/min (ref >60.00)
Glucose, Bld: 78 mg/dL (ref 70–99)
Potassium: 3.9 mEq/L (ref 3.5–5.1)
Sodium: 138 mEq/L (ref 135–145)

## 2020-12-11 LAB — LIPID PANEL
Cholesterol: 187 mg/dL (ref 0–200)
HDL: 72.5 mg/dL (ref >39.00)
LDL Cholesterol: 102 mg/dL — ABNORMAL HIGH (ref 0–99)
NonHDL: 114.56
Total CHOL/HDL Ratio: 3
Triglycerides: 63 mg/dL (ref 0.0–149.0)
VLDL: 12.6 mg/dL (ref 0.0–40.0)

## 2020-12-11 LAB — TSH: TSH: 1.44 u[IU]/mL (ref 0.35–4.50)

## 2020-12-11 LAB — HEPATIC FUNCTION PANEL
ALT: 17 U/L (ref 0–35)
AST: 15 U/L (ref 0–37)
Albumin: 3.9 g/dL (ref 3.5–5.2)
Alkaline Phosphatase: 70 U/L (ref 39–117)
Bilirubin, Direct: 0.1 mg/dL (ref 0.0–0.3)
Total Bilirubin: 0.4 mg/dL (ref 0.2–1.2)
Total Protein: 6.5 g/dL (ref 6.0–8.3)

## 2020-12-11 LAB — HEMOGLOBIN A1C: Hgb A1c MFr Bld: 5.7 % (ref 4.6–6.5)

## 2020-12-11 NOTE — Patient Instructions (Signed)
Send me  Info on massage therapy  letter.  Lab today .    Health Maintenance, Female Adopting a healthy lifestyle and getting preventive care are important in promoting health and wellness. Ask your health care provider about:  The right schedule for you to have regular tests and exams.  Things you can do on your own to prevent diseases and keep yourself healthy. What should I know about diet, weight, and exercise? Eat a healthy diet  Eat a diet that includes plenty of vegetables, fruits, low-fat dairy products, and lean protein.  Do not eat a lot of foods that are high in solid fats, added sugars, or sodium.   Maintain a healthy weight Body mass index (BMI) is used to identify weight problems. It estimates body fat based on height and weight. Your health care provider can help determine your BMI and help you achieve or maintain a healthy weight. Get regular exercise Get regular exercise. This is one of the most important things you can do for your health. Most adults should:  Exercise for at least 150 minutes each week. The exercise should increase your heart rate and make you sweat (moderate-intensity exercise).  Do strengthening exercises at least twice a week. This is in addition to the moderate-intensity exercise.  Spend less time sitting. Even light physical activity can be beneficial. Watch cholesterol and blood lipids Have your blood tested for lipids and cholesterol at 56 years of age, then have this test every 5 years. Have your cholesterol levels checked more often if:  Your lipid or cholesterol levels are high.  You are older than 56 years of age.  You are at high risk for heart disease. What should I know about cancer screening? Depending on your health history and family history, you may need to have cancer screening at various ages. This may include screening for:  Breast cancer.  Cervical cancer.  Colorectal cancer.  Skin cancer.  Lung cancer. What  should I know about heart disease, diabetes, and high blood pressure? Blood pressure and heart disease  High blood pressure causes heart disease and increases the risk of stroke. This is more likely to develop in people who have high blood pressure readings, are of African descent, or are overweight.  Have your blood pressure checked: ? Every 3-5 years if you are 46-27 years of age. ? Every year if you are 40 years old or older. Diabetes Have regular diabetes screenings. This checks your fasting blood sugar level. Have the screening done:  Once every three years after age 59 if you are at a normal weight and have a low risk for diabetes.  More often and at a younger age if you are overweight or have a high risk for diabetes. What should I know about preventing infection? Hepatitis B If you have a higher risk for hepatitis B, you should be screened for this virus. Talk with your health care provider to find out if you are at risk for hepatitis B infection. Hepatitis C Testing is recommended for:  Everyone born from [birth year] through 1966.  Anyone with known risk factors for hepatitis C. Sexually transmitted infections (STIs)  Get screened for STIs, including gonorrhea and chlamydia, if: ? You are sexually active and are younger than 56 years of age. ? You are older than 56 years of age and your health care provider tells you that you are at risk for this type of infection. ? Your sexual activity has changed since you were last  screened, and you are at increased risk for chlamydia or gonorrhea. Ask your health care provider if you are at risk.  Ask your health care provider about whether you are at high risk for HIV. Your health care provider may recommend a prescription medicine to help prevent HIV infection. If you choose to take medicine to prevent HIV, you should first get tested for HIV. You should then be tested every 3 months for as long as you are taking the medicine. Pregnancy  If  you are about to stop having your period (premenopausal) and you may become pregnant, seek counseling before you get pregnant.  Take 400 to 800 micrograms (mcg) of folic acid every day if you become pregnant.  Ask for birth control (contraception) if you want to prevent pregnancy. Osteoporosis and menopause Osteoporosis is a disease in which the bones lose minerals and strength with aging. This can result in bone fractures. If you are 70 years old or older, or if you are at risk for osteoporosis and fractures, ask your health care provider if you should:  Be screened for bone loss.  Take a calcium or vitamin D supplement to lower your risk of fractures.  Be given hormone replacement therapy (HRT) to treat symptoms of menopause. Follow these instructions at home: Lifestyle  Do not use any products that contain nicotine or tobacco, such as cigarettes, e-cigarettes, and chewing tobacco. If you need help quitting, ask your health care provider.  Do not use street drugs.  Do not share needles.  Ask your health care provider for help if you need support or information about quitting drugs. Alcohol use  Do not drink alcohol if: ? Your health care provider tells you not to drink. ? You are pregnant, may be pregnant, or are planning to become pregnant.  If you drink alcohol: ? Limit how much you use to 0-1 drink a day. ? Limit intake if you are breastfeeding.  Be aware of how much alcohol is in your drink. In the [place] one drink equals one 12 oz bottle of beer (355 mL), one 5 oz glass of wine (148 mL), or one 1 oz glass of hard liquor (44 mL). General instructions  Schedule regular health, dental, and eye exams.  Stay current with your vaccines.  Tell your health care provider if: ? You often feel depressed. ? You have ever been abused or do not feel safe at home. Summary  Adopting a healthy lifestyle and getting preventive care are important in promoting health and  wellness.  Follow your health care provider's instructions about healthy diet, exercising, and getting tested or screened for diseases.  Follow your health care provider's instructions on monitoring your cholesterol and blood pressure. This information is not intended to replace advice given to you by your health care provider. Make sure you discuss any questions you have with your health care provider. Document Revised: 07/29/2018 Document Reviewed: 07/29/2018 Elsevier Patient Education  2021 [employer].

## 2020-12-12 NOTE — Progress Notes (Signed)
Labs appear stable or favorable. No new advice.  Yearly check

## 2020-12-14 ENCOUNTER — Ambulatory Visit (INDEPENDENT_AMBULATORY_CARE_PROVIDER_SITE_OTHER): Payer: 59 | Admitting: *Deleted

## 2020-12-14 DIAGNOSIS — J309 Allergic rhinitis, unspecified: Secondary | ICD-10-CM

## 2020-12-25 ENCOUNTER — Ambulatory Visit (INDEPENDENT_AMBULATORY_CARE_PROVIDER_SITE_OTHER): Payer: 59 | Admitting: *Deleted

## 2020-12-25 DIAGNOSIS — J309 Allergic rhinitis, unspecified: Secondary | ICD-10-CM | POA: Diagnosis not present

## 2021-01-01 ENCOUNTER — Ambulatory Visit (INDEPENDENT_AMBULATORY_CARE_PROVIDER_SITE_OTHER): Payer: 59 | Admitting: *Deleted

## 2021-01-01 DIAGNOSIS — J309 Allergic rhinitis, unspecified: Secondary | ICD-10-CM | POA: Diagnosis not present

## 2021-01-08 ENCOUNTER — Other Ambulatory Visit: Payer: Self-pay

## 2021-01-08 ENCOUNTER — Ambulatory Visit: Payer: 59 | Attending: Internal Medicine

## 2021-01-08 DIAGNOSIS — Z23 Encounter for immunization: Secondary | ICD-10-CM

## 2021-01-08 NOTE — Progress Notes (Signed)
   Covid-19 Vaccination Clinic  Name:  Valerie [name]    MRN: [id number] DOB: August 15, 1966  01/08/2021  Valerie [name] was observed post Covid-19 immunization for 15 minutes without incident. She was provided with Vaccine Information Sheet and instruction to access the V-Safe system.   Valerie [name] was instructed to call 911 with any severe reactions post vaccine: Marland Kitchen Difficulty breathing  . Swelling of face and throat  . A fast heartbeat  . A bad rash all over body  . Dizziness and weakness   Immunizations Administered    Name Date Dose VIS Date Route   Moderna Covid-19 Booster Vaccine 01/08/2021  3:57 PM 0.25 mL 06/07/2020 Intramuscular   Manufacturer: Moderna   Lot: [id number]   NDC: 52841-324-40

## 2021-01-16 ENCOUNTER — Ambulatory Visit (INDEPENDENT_AMBULATORY_CARE_PROVIDER_SITE_OTHER): Payer: 59 | Admitting: *Deleted

## 2021-01-16 DIAGNOSIS — J309 Allergic rhinitis, unspecified: Secondary | ICD-10-CM | POA: Diagnosis not present

## 2021-01-24 ENCOUNTER — Ambulatory Visit (INDEPENDENT_AMBULATORY_CARE_PROVIDER_SITE_OTHER): Payer: 59

## 2021-01-24 DIAGNOSIS — J309 Allergic rhinitis, unspecified: Secondary | ICD-10-CM | POA: Diagnosis not present

## 2021-01-31 ENCOUNTER — Other Ambulatory Visit: Payer: Self-pay | Admitting: Internal Medicine

## 2021-01-31 DIAGNOSIS — Z1231 Encounter for screening mammogram for malignant neoplasm of breast: Secondary | ICD-10-CM

## 2021-02-05 ENCOUNTER — Ambulatory Visit (INDEPENDENT_AMBULATORY_CARE_PROVIDER_SITE_OTHER): Payer: 59

## 2021-02-05 DIAGNOSIS — J309 Allergic rhinitis, unspecified: Secondary | ICD-10-CM

## 2021-02-13 ENCOUNTER — Ambulatory Visit (INDEPENDENT_AMBULATORY_CARE_PROVIDER_SITE_OTHER): Payer: 59 | Admitting: *Deleted

## 2021-02-13 DIAGNOSIS — J309 Allergic rhinitis, unspecified: Secondary | ICD-10-CM | POA: Diagnosis not present

## 2021-02-15 ENCOUNTER — Ambulatory Visit (INDEPENDENT_AMBULATORY_CARE_PROVIDER_SITE_OTHER): Payer: 59

## 2021-02-15 DIAGNOSIS — J309 Allergic rhinitis, unspecified: Secondary | ICD-10-CM | POA: Diagnosis not present

## 2021-02-23 ENCOUNTER — Other Ambulatory Visit (HOSPITAL_COMMUNITY): Payer: Self-pay

## 2021-02-23 DIAGNOSIS — L03011 Cellulitis of right finger: Secondary | ICD-10-CM | POA: Diagnosis not present

## 2021-02-23 MED ORDER — SULFAMETHOXAZOLE-TRIMETHOPRIM 800-160 MG PO TABS
1.0000 | ORAL_TABLET | Freq: Two times a day (BID) | ORAL | 0 refills | Status: DC
  Filled 2021-02-23: qty 14, 7d supply, fill #0

## 2021-02-27 ENCOUNTER — Ambulatory Visit (INDEPENDENT_AMBULATORY_CARE_PROVIDER_SITE_OTHER): Payer: 59 | Admitting: *Deleted

## 2021-02-27 DIAGNOSIS — J309 Allergic rhinitis, unspecified: Secondary | ICD-10-CM

## 2021-03-06 ENCOUNTER — Ambulatory Visit (INDEPENDENT_AMBULATORY_CARE_PROVIDER_SITE_OTHER): Payer: 59

## 2021-03-06 DIAGNOSIS — J309 Allergic rhinitis, unspecified: Secondary | ICD-10-CM

## 2021-03-14 ENCOUNTER — Ambulatory Visit (INDEPENDENT_AMBULATORY_CARE_PROVIDER_SITE_OTHER): Payer: 59 | Admitting: *Deleted

## 2021-03-14 DIAGNOSIS — J309 Allergic rhinitis, unspecified: Secondary | ICD-10-CM

## 2021-03-27 ENCOUNTER — Ambulatory Visit: Payer: 59

## 2021-03-27 ENCOUNTER — Ambulatory Visit (INDEPENDENT_AMBULATORY_CARE_PROVIDER_SITE_OTHER): Payer: 59 | Admitting: *Deleted

## 2021-03-27 DIAGNOSIS — J309 Allergic rhinitis, unspecified: Secondary | ICD-10-CM

## 2021-04-09 ENCOUNTER — Other Ambulatory Visit: Payer: Self-pay

## 2021-04-09 ENCOUNTER — Ambulatory Visit
Admission: RE | Admit: 2021-04-09 | Discharge: 2021-04-09 | Disposition: A | Discharge status: Home / Self Care | Payer: 59 | Source: Ambulatory Visit | Attending: Internal Medicine | Admitting: Internal Medicine

## 2021-04-09 DIAGNOSIS — Z1231 Encounter for screening mammogram for malignant neoplasm of breast: Secondary | ICD-10-CM

## 2021-04-12 ENCOUNTER — Ambulatory Visit (INDEPENDENT_AMBULATORY_CARE_PROVIDER_SITE_OTHER): Payer: 59 | Admitting: *Deleted

## 2021-04-12 DIAGNOSIS — J309 Allergic rhinitis, unspecified: Secondary | ICD-10-CM | POA: Diagnosis not present

## 2021-04-24 DIAGNOSIS — H5212 Myopia, left eye: Secondary | ICD-10-CM | POA: Diagnosis not present

## 2021-04-25 ENCOUNTER — Other Ambulatory Visit (HOSPITAL_BASED_OUTPATIENT_CLINIC_OR_DEPARTMENT_OTHER): Payer: Self-pay

## 2021-04-25 ENCOUNTER — Ambulatory Visit (INDEPENDENT_AMBULATORY_CARE_PROVIDER_SITE_OTHER): Payer: 59

## 2021-04-25 DIAGNOSIS — J309 Allergic rhinitis, unspecified: Secondary | ICD-10-CM

## 2021-04-30 ENCOUNTER — Ambulatory Visit (INDEPENDENT_AMBULATORY_CARE_PROVIDER_SITE_OTHER): Payer: 59 | Admitting: *Deleted

## 2021-04-30 DIAGNOSIS — J309 Allergic rhinitis, unspecified: Secondary | ICD-10-CM | POA: Diagnosis not present

## 2021-05-11 ENCOUNTER — Other Ambulatory Visit: Payer: Self-pay

## 2021-05-14 ENCOUNTER — Ambulatory Visit (INDEPENDENT_AMBULATORY_CARE_PROVIDER_SITE_OTHER): Payer: 59

## 2021-05-14 DIAGNOSIS — J309 Allergic rhinitis, unspecified: Secondary | ICD-10-CM

## 2021-06-07 ENCOUNTER — Ambulatory Visit (INDEPENDENT_AMBULATORY_CARE_PROVIDER_SITE_OTHER): Payer: 59 | Admitting: *Deleted

## 2021-06-07 DIAGNOSIS — J309 Allergic rhinitis, unspecified: Secondary | ICD-10-CM

## 2021-06-14 ENCOUNTER — Other Ambulatory Visit (HOSPITAL_COMMUNITY)
Admission: RE | Admit: 2021-06-14 | Discharge: 2021-06-14 | Disposition: A | Discharge status: Home / Self Care | Payer: 59 | Source: Ambulatory Visit | Attending: Obstetrics & Gynecology | Admitting: Obstetrics & Gynecology

## 2021-06-14 ENCOUNTER — Other Ambulatory Visit: Payer: Self-pay

## 2021-06-14 ENCOUNTER — Encounter: Payer: Self-pay | Admitting: Obstetrics & Gynecology

## 2021-06-14 ENCOUNTER — Ambulatory Visit (INDEPENDENT_AMBULATORY_CARE_PROVIDER_SITE_OTHER): Payer: 59 | Admitting: Obstetrics & Gynecology

## 2021-06-14 VITALS — BP 114/70 | HR 76 | Resp 16 | Ht 63.0 in | Wt 127.0 lb

## 2021-06-14 DIAGNOSIS — Z01419 Encounter for gynecological examination (general) (routine) without abnormal findings: Secondary | ICD-10-CM | POA: Diagnosis not present

## 2021-06-14 DIAGNOSIS — Z780 Asymptomatic menopausal state: Secondary | ICD-10-CM

## 2021-06-14 NOTE — Progress Notes (Addendum)
    Valerie [name] April 30, 1966 [id number]   History:    55 y.o. G0 Married   RP:  Established patient presenting for annual gyn exam    HPI: Postmenopause, well on no HRT.  No PMB.  No pelvic pain.  No pain with IC.  Urine/BMs normal.  Breasts normal.  BMI 22.5.  Not exercising as regularly as usual.  Health labs with Fam MD.  Past medical history,surgical history, family history and social history were all reviewed and documented in the EPIC chart.  Gynecologic History Patient's last menstrual period was 11/28/2014.  Obstetric History OB History  Gravida Para Term Preterm AB Living  0 0 0 0 0 0  SAB IAB Ectopic Multiple Live Births  0 0 0 0 0     ROS: A ROS was performed and pertinent positives and negatives are included in the history.  GENERAL: No fevers or chills. HEENT: No change in vision, no earache, sore throat or sinus congestion. NECK: No pain or stiffness. CARDIOVASCULAR: No chest pain or pressure. No palpitations. PULMONARY: No shortness of breath, cough or wheeze. GASTROINTESTINAL: No abdominal pain, nausea, vomiting or diarrhea, melena or bright red blood per rectum. GENITOURINARY: No urinary frequency, urgency, hesitancy or dysuria. MUSCULOSKELETAL: No joint or muscle pain, no back pain, no recent trauma. DERMATOLOGIC: No rash, no itching, no lesions. ENDOCRINE: No polyuria, polydipsia, no heat or cold intolerance. No recent change in weight. HEMATOLOGICAL: No anemia or easy bruising or bleeding. NEUROLOGIC: No headache, seizures, numbness, tingling or weakness. PSYCHIATRIC: No depression, no loss of interest in normal activity or change in sleep pattern.     Exam:   BP 114/70   Pulse 76   Resp 16   Ht 5\' 3"  (1.6 m)   Wt 127 lb (57.6 kg)   LMP 11/28/2014   BMI 22.50 kg/m   Body mass index is 22.5 kg/m.  General appearance : Well developed well nourished female. No acute distress HEENT: Eyes: no retinal hemorrhage or exudates,  Neck supple, trachea midline, no  carotid bruits, no thyroidmegaly Lungs: Clear to auscultation, no rhonchi or wheezes, or rib retractions  Heart: Regular rate and rhythm, no murmurs or gallops Breast:Examined in sitting and supine position were symmetrical in appearance, no palpable masses or tenderness,  no skin retraction, no nipple inversion, no nipple discharge, no skin discoloration, no axillary or supraclavicular lymphadenopathy Abdomen: no palpable masses or tenderness, no rebound or guarding Extremities: no edema or skin discoloration or tenderness  Pelvic: Vulva: Normal             Vagina: No gross lesions or discharge  Cervix: No gross lesions or discharge.  Pap reflex done.  Uterus  AV, normal size, shape and consistency, non-tender and mobile  Adnexa  Without masses or tenderness  Anus: Normal   Assessment/Plan:  55 y.o. female for annual exam   1. Encounter for routine gynecological examination with Papanicolaou smear of cervix Postmenopause, well on no HRT.  No PMB.  No pelvic pain.  No pain with IC.  Pap reflex done today. Urine/BMs normal.  Breasts normal.  Screen mammo 03/2021 Neg.  BMI 22.5. Not exercising as regularly as usual.  Health labs with Fam MD. - Cytology - PAP( [facility])  2. Postmenopause Postmenopause, well on no HRT.  No PMB.   Other orders - APPLE CIDER VINEGAR PO; Take by mouth. - UNABLE TO FIND; Med Name: fat burner   04/2021 MD, 4:09 PM 06/14/2021

## 2021-06-15 ENCOUNTER — Encounter: Payer: Self-pay | Admitting: Obstetrics & Gynecology

## 2021-06-18 LAB — CYTOLOGY - PAP: Diagnosis: NEGATIVE

## 2021-06-19 ENCOUNTER — Ambulatory Visit: Payer: 59 | Admitting: Obstetrics & Gynecology

## 2021-06-21 ENCOUNTER — Ambulatory Visit: Payer: 59 | Admitting: Obstetrics & Gynecology

## 2021-07-05 ENCOUNTER — Ambulatory Visit (INDEPENDENT_AMBULATORY_CARE_PROVIDER_SITE_OTHER): Payer: 59

## 2021-07-05 DIAGNOSIS — J309 Allergic rhinitis, unspecified: Secondary | ICD-10-CM | POA: Diagnosis not present

## 2021-07-06 ENCOUNTER — Other Ambulatory Visit (HOSPITAL_COMMUNITY): Payer: Self-pay

## 2021-07-06 MED ORDER — OMRON 3 SERIES BP MONITOR DEVI
0 refills | Status: DC
  Filled 2021-07-06: qty 1, 1d supply, fill #0

## 2021-07-17 ENCOUNTER — Ambulatory Visit (INDEPENDENT_AMBULATORY_CARE_PROVIDER_SITE_OTHER): Payer: 59 | Admitting: *Deleted

## 2021-07-17 DIAGNOSIS — J309 Allergic rhinitis, unspecified: Secondary | ICD-10-CM

## 2021-07-27 ENCOUNTER — Ambulatory Visit (INDEPENDENT_AMBULATORY_CARE_PROVIDER_SITE_OTHER): Payer: 59

## 2021-07-27 DIAGNOSIS — J309 Allergic rhinitis, unspecified: Secondary | ICD-10-CM

## 2021-08-09 ENCOUNTER — Other Ambulatory Visit: Payer: Self-pay

## 2021-08-09 ENCOUNTER — Encounter: Payer: Self-pay | Admitting: Allergy

## 2021-08-09 ENCOUNTER — Ambulatory Visit: Payer: 59 | Admitting: Allergy

## 2021-08-09 VITALS — BP 104/62 | HR 82 | Temp 97.2°F | Resp 12 | Ht 62.6 in | Wt 131.6 lb

## 2021-08-09 DIAGNOSIS — J3089 Other allergic rhinitis: Secondary | ICD-10-CM | POA: Diagnosis not present

## 2021-08-09 MED ORDER — AZELASTINE HCL 0.1 % NA SOLN
1.0000 | Freq: Two times a day (BID) | NASAL | 5 refills | Status: DC | PRN
  Filled 2021-08-09: qty 30, 30d supply, fill #0

## 2021-08-09 MED ORDER — EPINEPHRINE 0.3 MG/0.3ML IJ SOAJ
0.3000 mg | Freq: Every day | INTRAMUSCULAR | 2 refills | Status: DC | PRN
  Filled 2021-08-09: qty 2, 1d supply, fill #0

## 2021-08-09 NOTE — Progress Notes (Signed)
Follow-up Note  RE: Valerie [name] MRN: [id number] DOB: [birth year] Date of Office Visit: 08/09/2021   History of present illness: Valerie [name] is a 55 y.o. female presenting today for follow-up of allergic rhinitis on immunotherapy.  She was last seen in the office on 04/27/2020 by myself.  She has done well since last visit without any major health changes, surgeries or hospitalizations.  She states her allergy symptoms are improved.  She has noted less voice changes, sore throat, facial pain and brain fog/headache symptoms since being back on immunotherapy.  She is almost to full maintenance dosing.  She is tolerating injections well without large local or systemic symptoms.  She continues to take zyrtec daily.  She uses astelin as needed.  She does note that it seems when she gets to maintenance (she has done immunotherapy course before) she notes itching of her back.   She states she has been having reflux and will take prilosec that helps.    Review of systems: Review of Systems  Constitutional: Negative.   HENT: Negative.    Eyes: Negative.   Respiratory: Negative.    Cardiovascular: Negative.   Gastrointestinal: Negative.   Musculoskeletal: Negative.   Skin: Negative.   Allergic/Immunologic: Negative.   Neurological: Negative.     All other systems negative unless noted above in HPI  Past medical/social/surgical/family history have been reviewed and are unchanged unless specifically indicated below.  No changes  Medication List: Current Outpatient Medications  Medication Sig Dispense Refill   APPLE CIDER VINEGAR PO Take by mouth.     Ascorbic Acid (VITAMIN C PO) Take by mouth.     BIOTIN PO Take by mouth.     cetirizine (ZYRTEC) 10 MG tablet Take 10 mg by mouth daily as needed for allergies.     cholecalciferol (VITAMIN D) 1000 UNITS tablet Take 1,000 Units by mouth daily.     Cyanocobalamin (VITAMIN B-12 PO) Take by mouth.     EPINEPHrine 0.3 mg/0.3 mL IJ SOAJ  injection INJECT 0.3 MG (1 SYRINGE) INTO THE MUSCLE ONCE FOR 1 DOSE AS NEEDED FOR LIFE-THREATENING ALLERGIC REACTIONS 2 each 2   Multiple Vitamin (MULTIVITAMIN WITH MINERALS) TABS Take 1 tablet by mouth as needed.     multivitamin-lutein (OCUVITE-LUTEIN) CAPS capsule Take 1 capsule by mouth daily.     Omega-3 Fatty Acids (FISH OIL) 1200 MG CAPS Take 1,200 mg by mouth daily.     TURMERIC PO Take by mouth.     UNABLE TO FIND Med Name: fat burner     valACYclovir (VALTREX) 1000 MG tablet Take 2 TABLETS BY MOUTH AND REPEAT IN 12 HOURS OR AS DIRECTED (Patient taking differently: Take 2 TABLETS BY MOUTH AND REPEAT IN 12 HOURS OR AS DIRECTED) 30 tablet 0   Blood Pressure Monitoring (OMRON 3 SERIES BP MONITOR) DEVI Use as directed. (Patient not taking: Reported on 08/09/2021) 1 each 0   No current facility-administered medications for this visit.     Known medication allergies: Allergies  Allergen Reactions   Augmentin [Amoxicillin-Pot Clavulanate] Diarrhea   Ceftin [Cefuroxime Axetil] Other (See Comments)    Severe yeast infection   Nsaids Nausea Only and Other (See Comments)    Abdominal pain / severe reflux/chest pain     Physical examination: Blood pressure 104/62, pulse 82, temperature (!) 97.2 F (36.2 C), temperature source Temporal, resp. rate 12, height 5' 2.6" (1.59 m), weight 131 lb 9.6 oz (59.7 kg), last menstrual period 11/28/2014, SpO2 100 %.  General: Alert, interactive, in no acute distress. HEENT: PERRLA, TMs pearly gray, turbinates non-edematous without discharge, post-pharynx non erythematous. Neck: Supple without lymphadenopathy. Lungs: Clear to auscultation without wheezing, rhonchi or rales. {no increased work of breathing. CV: Normal S1, S2 without murmurs. Abdomen: Nondistended, nontender. Skin: Warm and dry, without lesions or rashes. Extremities:  No clubbing, cyanosis or edema. Neuro:   Grossly intact.  Diagnositics/Labs: None today  Assessment and plan:    Allergic Rhinitis  - continue appropriate allergen avoidance measures  - continue allergen immunotherapy (allergy shots) per protocol and have access to your epinephrine device   - consider performing nasal saline rinse 3-7 days a week to help keep nose/sinuses clear  - continue daily antihistamine Zyrtec 10mg  or Xyzal 5mg  as needed.   If needed can take additional dose around your injections to help with itching of the back  - use Astelin 1-2 sprays twice a day as needed nasal drainage   Follow-up 12 months or sooner if needed    I appreciate the opportunity to take part in Helmi's care. Please do not hesitate to contact me with questions.  Sincerely,   , MD Allergy/Immunology [facility]

## 2021-08-09 NOTE — Patient Instructions (Addendum)
Allergies   - continue appropriate allergen avoidance measures  - continue allergen immunotherapy (allergy shots) per protocol and have access to your epinephrine device   - consider performing nasal saline rinse 3-7 days a week to help keep nose/sinuses clear  - continue daily antihistamine Zyrtec 10mg or Xyzal 5mg as needed.   If needed can take additional dose around your injections to help with itching of the back  - use Astelin 1-2 sprays twice a day as needed nasal drainage   Follow-up 12 months or sooner if needed  

## 2021-08-10 ENCOUNTER — Other Ambulatory Visit (HOSPITAL_COMMUNITY): Payer: Self-pay

## 2021-08-20 ENCOUNTER — Other Ambulatory Visit (HOSPITAL_COMMUNITY): Payer: Self-pay

## 2021-08-22 ENCOUNTER — Other Ambulatory Visit (HOSPITAL_COMMUNITY): Payer: Self-pay

## 2021-09-18 ENCOUNTER — Ambulatory Visit (INDEPENDENT_AMBULATORY_CARE_PROVIDER_SITE_OTHER): Payer: 59 | Admitting: *Deleted

## 2021-09-18 DIAGNOSIS — J309 Allergic rhinitis, unspecified: Secondary | ICD-10-CM | POA: Diagnosis not present

## 2021-09-20 ENCOUNTER — Ambulatory Visit (INDEPENDENT_AMBULATORY_CARE_PROVIDER_SITE_OTHER): Payer: 59

## 2021-09-20 DIAGNOSIS — J309 Allergic rhinitis, unspecified: Secondary | ICD-10-CM

## 2021-09-27 ENCOUNTER — Ambulatory Visit (INDEPENDENT_AMBULATORY_CARE_PROVIDER_SITE_OTHER): Payer: 59

## 2021-09-27 DIAGNOSIS — J309 Allergic rhinitis, unspecified: Secondary | ICD-10-CM

## 2021-10-02 ENCOUNTER — Ambulatory Visit (INDEPENDENT_AMBULATORY_CARE_PROVIDER_SITE_OTHER): Payer: 59 | Admitting: *Deleted

## 2021-10-02 DIAGNOSIS — J309 Allergic rhinitis, unspecified: Secondary | ICD-10-CM | POA: Diagnosis not present

## 2021-10-23 DIAGNOSIS — J3081 Allergic rhinitis due to animal (cat) (dog) hair and dander: Secondary | ICD-10-CM | POA: Diagnosis not present

## 2021-10-23 NOTE — Progress Notes (Signed)
VIAL EXP 10-24-22 ?

## 2021-10-24 ENCOUNTER — Ambulatory Visit (INDEPENDENT_AMBULATORY_CARE_PROVIDER_SITE_OTHER): Payer: 59

## 2021-10-24 DIAGNOSIS — J309 Allergic rhinitis, unspecified: Secondary | ICD-10-CM

## 2021-11-08 ENCOUNTER — Ambulatory Visit (INDEPENDENT_AMBULATORY_CARE_PROVIDER_SITE_OTHER): Payer: 59

## 2021-11-08 DIAGNOSIS — J309 Allergic rhinitis, unspecified: Secondary | ICD-10-CM | POA: Diagnosis not present

## 2021-11-14 ENCOUNTER — Ambulatory Visit (INDEPENDENT_AMBULATORY_CARE_PROVIDER_SITE_OTHER): Payer: 59

## 2021-11-14 DIAGNOSIS — J309 Allergic rhinitis, unspecified: Secondary | ICD-10-CM

## 2021-12-07 ENCOUNTER — Ambulatory Visit (INDEPENDENT_AMBULATORY_CARE_PROVIDER_SITE_OTHER): Payer: 59

## 2021-12-07 DIAGNOSIS — J309 Allergic rhinitis, unspecified: Secondary | ICD-10-CM

## 2021-12-11 ENCOUNTER — Ambulatory Visit (INDEPENDENT_AMBULATORY_CARE_PROVIDER_SITE_OTHER): Payer: 59

## 2021-12-11 DIAGNOSIS — J309 Allergic rhinitis, unspecified: Secondary | ICD-10-CM

## 2021-12-17 ENCOUNTER — Ambulatory Visit (INDEPENDENT_AMBULATORY_CARE_PROVIDER_SITE_OTHER): Payer: 59

## 2021-12-17 DIAGNOSIS — J309 Allergic rhinitis, unspecified: Secondary | ICD-10-CM

## 2021-12-28 ENCOUNTER — Ambulatory Visit (INDEPENDENT_AMBULATORY_CARE_PROVIDER_SITE_OTHER): Payer: 59

## 2021-12-28 DIAGNOSIS — J309 Allergic rhinitis, unspecified: Secondary | ICD-10-CM | POA: Diagnosis not present

## 2022-01-08 ENCOUNTER — Encounter: Payer: Self-pay | Admitting: Internal Medicine

## 2022-01-08 ENCOUNTER — Ambulatory Visit (INDEPENDENT_AMBULATORY_CARE_PROVIDER_SITE_OTHER): Payer: 59

## 2022-01-08 ENCOUNTER — Ambulatory Visit (INDEPENDENT_AMBULATORY_CARE_PROVIDER_SITE_OTHER): Payer: 59 | Admitting: Internal Medicine

## 2022-01-08 VITALS — BP 112/72 | HR 66 | Temp 98.2°F | Ht 62.6 in | Wt 131.6 lb

## 2022-01-08 DIAGNOSIS — Z13220 Encounter for screening for lipoid disorders: Secondary | ICD-10-CM

## 2022-01-08 DIAGNOSIS — Z516 Encounter for desensitization to allergens: Secondary | ICD-10-CM

## 2022-01-08 DIAGNOSIS — Z823 Family history of stroke: Secondary | ICD-10-CM

## 2022-01-08 DIAGNOSIS — Z0000 Encounter for general adult medical examination without abnormal findings: Secondary | ICD-10-CM | POA: Diagnosis not present

## 2022-01-08 DIAGNOSIS — D72819 Decreased white blood cell count, unspecified: Secondary | ICD-10-CM | POA: Diagnosis not present

## 2022-01-08 DIAGNOSIS — J309 Allergic rhinitis, unspecified: Secondary | ICD-10-CM | POA: Diagnosis not present

## 2022-01-08 DIAGNOSIS — Z79899 Other long term (current) drug therapy: Secondary | ICD-10-CM | POA: Diagnosis not present

## 2022-01-08 LAB — CBC WITH DIFFERENTIAL/PLATELET
Basophils Absolute: 0 10*3/uL (ref 0.0–0.1)
Basophils Relative: 0.4 % (ref 0.0–3.0)
Eosinophils Absolute: 0.1 10*3/uL (ref 0.0–0.7)
Eosinophils Relative: 1.9 % (ref 0.0–5.0)
HCT: 37.5 % (ref 36.0–46.0)
Hemoglobin: 12.7 g/dL (ref 12.0–15.0)
Lymphocytes Relative: 44.6 % (ref 12.0–46.0)
Lymphs Abs: 2 10*3/uL (ref 0.7–4.0)
MCHC: 33.8 g/dL (ref 30.0–36.0)
MCV: 88.5 fl (ref 78.0–100.0)
Monocytes Absolute: 0.3 10*3/uL (ref 0.1–1.0)
Monocytes Relative: 5.8 % (ref 3.0–12.0)
Neutro Abs: 2.2 10*3/uL (ref 1.4–7.7)
Neutrophils Relative %: 47.3 % (ref 43.0–77.0)
Platelets: 198 10*3/uL (ref 150.0–400.0)
RBC: 4.23 Mil/uL (ref 3.87–5.11)
RDW: 12.3 % (ref 11.5–15.5)
WBC: 4.5 10*3/uL (ref 4.0–10.5)

## 2022-01-08 LAB — BASIC METABOLIC PANEL
BUN: 9 mg/dL (ref 6–23)
CO2: 30 mEq/L (ref 19–32)
Calcium: 9.2 mg/dL (ref 8.4–10.5)
Chloride: 102 mEq/L (ref 96–112)
Creatinine, Ser: 0.63 mg/dL (ref 0.40–1.20)
GFR: 98.82 mL/min (ref >60.00)
Glucose, Bld: 89 mg/dL (ref 70–99)
Potassium: 4.1 mEq/L (ref 3.5–5.1)
Sodium: 135 mEq/L (ref 135–145)

## 2022-01-08 LAB — HEPATIC FUNCTION PANEL
ALT: 19 U/L (ref 0–35)
AST: 20 U/L (ref 0–37)
Albumin: 4.1 g/dL (ref 3.5–5.2)
Alkaline Phosphatase: 59 U/L (ref 39–117)
Bilirubin, Direct: 0.1 mg/dL (ref 0.0–0.3)
Total Bilirubin: 0.4 mg/dL (ref 0.2–1.2)
Total Protein: 6.6 g/dL (ref 6.0–8.3)

## 2022-01-08 LAB — HEMOGLOBIN A1C: Hgb A1c MFr Bld: 5.7 % (ref 4.6–6.5)

## 2022-01-08 LAB — LIPID PANEL
Cholesterol: 194 mg/dL (ref 0–200)
HDL: 75.1 mg/dL (ref >39.00)
LDL Cholesterol: 109 mg/dL — ABNORMAL HIGH (ref 0–99)
NonHDL: 118.6
Total CHOL/HDL Ratio: 3
Triglycerides: 46 mg/dL (ref 0.0–149.0)
VLDL: 9.2 mg/dL (ref 0.0–40.0)

## 2022-01-08 LAB — TSH: TSH: 1.14 u[IU]/mL (ref 0.35–5.50)

## 2022-01-08 NOTE — Patient Instructions (Signed)
Good to see you today . Normal exam.   Lab pending . Attention to lifestyle intervention healthy eating and exercise .  Get more sleep.

## 2022-01-08 NOTE — Progress Notes (Signed)
Lipids favorable  rest labs normal range The 10-year ASCVD risk score (Arnett DK, et al., 2019) is: 1.2%   Values used to calculate the score:     Age: 56 years     Sex: Female     Is Non-Hispanic African American: No     Diabetic: No     Tobacco smoker: No     Systolic Blood Pressure: [id number] mmHg     Is BP treated: No     HDL Cholesterol: 75.1 mg/dL     Total Cholesterol: 194 mg/dL

## 2022-01-08 NOTE — Progress Notes (Signed)
Chief Complaint  Patient presents with   Annual Exam    Fasting     HPI: Patient  Valerie [name]  56 y.o. comes in today for Preventive Health Care visit  Reg care team  Allergy  shots  not yet to maintanance .  Gyne:utd    Health Maintenance  Topic Date Due   COVID-19 Vaccine (1) 07/11/2022 (Originally 08/08/1966)   Hepatitis C Screening  07/11/2022 (Originally 02/07/1983)   HIV Screening  07/11/2022 (Originally 02/07/1980)   Zoster Vaccines- Shingrix (1 of 2) 07/11/2022 (Originally 02/07/1984)   INFLUENZA VACCINE  03/19/2022   MAMMOGRAM  04/10/2023   PAP SMEAR-Modifier  06/14/2024   TETANUS/TDAP  12/04/2024   COLONOSCOPY (Pts 45-27yrs Insurance coverage will need to be confirmed)  11/15/2025   HPV VACCINES  Aged Out   Health Maintenance Review LIFESTYLE:  Exercise:  nothing so far .  Busy  :mom living with her  but is active  Tobacco/ETS: no Alcohol:  rare  Sugar beverages:  honey  agave  2 per day  Sleep: 5-6  Drug use: no HH of  3  1 cats  Work: ft extra     ROS:  sometimes  swallowing issue poss from pnd allergy no  other dysphagia obstructive sx  IMM/ Allergy: No unusual infections.  Allergy .   REST of 12 system review negative except as per HPI   Past Medical History:  Diagnosis Date   Allergy    cats   Anemia    NOS   Female fertility problem    hx of treatment   GERD (gastroesophageal reflux disease)    HSV infection    labialis   RECTAL BLEEDING 07/10/2007   Qualifier: Diagnosis of  By: Fabian Sharp MD, Neta Mends 2008 colonoscopy [facility].     Past Surgical History:  Procedure Laterality Date   CYST REMOVAL HAND Right    DILITATION & CURRETTAGE/HYSTROSCOPY WITH VERSAPOINT RESECTION  08/27/2012   Procedure: DILATATION & CURETTAGE/HYSTEROSCOPY WITH VERSAPOINT RESECTION;  Surgeon: Genia Del, MD;  Location: [facility];  Service: Gynecology;;   LASIK  2009   mono vision   TRIGGER FINGER RELEASE Right 06/02/2017   Procedure: RELEASE TRIGGER  FINGER/A-1 PULLEY RIGHT RING TRIGGER RELEASE;  Surgeon: Betha Loa, MD;  Location: [facility];  Service: Orthopedics;  Laterality: Right;    Family History  Problem Relation Age of Onset   Diabetes Father    Hyperlipidemia Father    Heart disease Father    Stroke Father    Allergic rhinitis Mother    Colon polyps Brother        elev TG   Allergic rhinitis Brother    Alcohol abuse Other    Breast cancer Maternal Aunt        diagnosed in her 110's   Stroke Maternal Grandmother    Diabetes Maternal Grandmother    Heart disease Maternal Grandmother    Diabetes Maternal Grandfather    Hyperlipidemia Brother        elevated triglycerides   Liver disease Maternal Uncle        alcohol related   Colon cancer Neg Hx     Social History   Socioeconomic History   Marital status: Married    Spouse name: Not on file   Number of children: Not on file   Years of education: Not on file   Highest education level: Not on file  Occupational History   Occupation: PA    Employer: [employer]    Comment: Rehab  Tobacco Use   Smoking status: Never   Smokeless tobacco: Never  Vaping Use   Vaping Use: Never used  Substance and Sexual Activity   Alcohol use: Not Currently    Comment: ONCE Q 2 MONTHS   Drug use: No   Sexual activity: Yes    Partners: Male    Birth control/protection: Post-menopausal    Comment: 1st intercourse- 29, partners- 1, married- 9 yrs   Other Topics Concern   Not on file  Social History Narrative   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     Ascorbic Acid (VITAMIN C PO) Take by mouth.     azelastine (ASTELIN) 0.1 % nasal spray Place 1-2 sprays into both nostrils 2 (two) times daily as needed. 30 mL 5   BIOTIN PO Take by mouth.     Blood Pressure Monitoring (OMRON 3 SERIES BP MONITOR) DEVI Use as directed. 1 each 0   cetirizine (ZYRTEC) 10 MG tablet Take 10 mg by mouth daily as needed for allergies.     cholecalciferol (VITAMIN D) 1000 UNITS tablet Take 1,000 Units by mouth daily.     Cyanocobalamin (VITAMIN B-12 PO) Take by mouth.     EPINEPHrine 0.3 mg/0.3 mL IJ SOAJ injection INJECT 0.3 MG (1 SYRINGE) INTO THE MUSCLE ONCE FOR 1 DOSE AS NEEDED FOR LIFE-THREATENING ALLERGIC REACTIONS 2 each 2   Multiple Vitamin (MULTIVITAMIN WITH MINERALS) TABS Take 1 tablet by mouth as needed.     multivitamin-lutein (OCUVITE-LUTEIN) CAPS capsule Take 1 capsule by mouth daily.     Omega-3 Fatty Acids (FISH OIL) 1200 MG CAPS Take 1,200 mg by mouth daily.     TURMERIC PO Take by mouth.     UNABLE TO FIND Med Name: fat burner     valACYclovir (VALTREX) 1000 MG tablet Take 2 TABLETS BY MOUTH AND REPEAT IN 12 HOURS OR AS DIRECTED (Patient taking differently: Take 2 TABLETS BY MOUTH AND REPEAT IN 12 HOURS OR AS DIRECTED) 30 tablet 0   zinc gluconate 50 MG tablet Take 50 mg by mouth daily.     No facility-administered medications prior to visit.     EXAM:  BP 112/72 (BP Location: Left Arm, Patient Position: Sitting, Cuff Size: Normal)   Pulse 66   Temp 98.2 F (36.8 C) (Oral)   Ht 5' 2.6" (1.59 m)   Wt 131 lb 9.6 oz (59.7 kg)   LMP 11/28/2014   SpO2 99%   BMI 23.61 kg/m   Body mass index is 23.61 kg/m. Wt Readings from Last 3 Encounters:  01/08/22 131 lb 9.6 oz (59.7 kg)  08/09/21 131 lb 9.6 oz (59.7 kg)  06/14/21 127 lb (57.6 kg)    Physical Exam: Vital signs reviewed GNF:AOZHGEN:This is a well-developed well-nourished alert cooperative    who appearsr stated age in no acute distress.  HEENT:  normocephalic atraumatic , Eyes: PERRL EOM's full,  conjunctiva clear, Nares: paten,t no deformity discharge or tenderness., Ears: no deformity EAC's clear TMs with normal landmarks.NECK: supple without masses, thyromegaly or bruits. CHEST/PULM:  Clear to auscultation and percussion breath sounds equal no wheeze , rales or rhonchi. No chest wall deformities or tenderness. Breast: normal by inspection . No dimpling, discharge, masses, tenderness or discharge . CV: PMI is nondisplaced, S1 S2 no gallops, murmurs, rubs. Peripheral pulses are full without delay.No JVD .  ABDOMEN: Bowel sounds normal nontender  No guard or rebound, no hepato splenomegal no CVA tenderness.   Extremtities:  No clubbing cyanosis or edema, no acute joint swelling or redness no focal atrophy NEURO:  Oriented x3, cranial nerves 3-12 appear to be intact, no obvious focal weakness,gait within normal limits no abnormal reflexes or asymmetrical SKIN: No acute rashes normal turgor, color, no bruising or petechiae. PSYCH: Oriented, good eye contact, no obvious depression anxiety, cognition and judgment appear normal. LN: no cervical axillary inguinal adenopathy  Lab Results  Component Value Date   WBC 4.5 01/08/2022   HGB 12.7 01/08/2022   HCT 37.5 01/08/2022   PLT 198.0 01/08/2022   GLUCOSE 89 01/08/2022   CHOL 194 01/08/2022   TRIG 46.0 01/08/2022   HDL 75.10 01/08/2022   LDLCALC 109 (H) 01/08/2022   ALT 19 01/08/2022   AST 20 01/08/2022   NA 135 01/08/2022   K 4.1 01/08/2022   CL 102 01/08/2022   CREATININE 0.63 01/08/2022   BUN 9 01/08/2022   CO2 30 01/08/2022   TSH 1.14 01/08/2022   HGBA1C 5.7 01/08/2022    BP Readings from Last 3 Encounters:  01/08/22 112/72  08/09/21 104/62  06/14/21 114/70    Lab plan reviewed with patient , fasting   ASSESSMENT AND PLAN:  Discussed the following assessment and plan:    ICD-10-CM   1. Visit for preventive health examination  Z00.00 Basic metabolic panel    CBC  with Differential/Platelet    Hemoglobin A1c    Hepatic function panel    Lipid panel    TSH    TSH    Lipid panel    Hepatic function panel    Hemoglobin A1c    CBC with Differential/Platelet    Basic metabolic panel    2. Medication management  Z79.899 Basic metabolic panel    CBC with Differential/Platelet    Hemoglobin A1c    Hepatic function panel    Lipid panel    TSH    TSH    Lipid panel    Hepatic function panel    Hemoglobin A1c    CBC with Differential/Platelet    Basic metabolic panel    3. Screening, lipid  Z13.220 Basic metabolic panel    CBC with Differential/Platelet    Hemoglobin A1c    Hepatic function panel    Lipid panel    TSH    TSH    Lipid panel    Hepatic function panel    Hemoglobin A1c    CBC with Differential/Platelet    Basic metabolic panel    4. Leukopenia, unspecified type  D72.819 Basic metabolic panel    CBC with Differential/Platelet    Hemoglobin A1c    Hepatic function panel    Lipid panel    TSH    TSH    Lipid panel    Hepatic function panel    Hemoglobin A1c    CBC with Differential/Platelet    Basic metabolic panel    5. Family history of  stroke  Z82.3 Basic metabolic panel    CBC with Differential/Platelet    Hemoglobin A1c    Hepatic function panel    Lipid panel    TSH    TSH    Lipid panel    Hepatic function panel    Hemoglobin A1c    CBC with Differential/Platelet    Basic metabolic panel    6. Allergy desensitization therapy  Z51.6     Monitoring labs   Cont lsi  yearly visit  utd on other parameters  Let us know if any progressive  throat swallowing sx  Return in about 1 year (around 01/09/2023) for depending on results.  Patient Care Team: Lyndsy Gilberto, Neta Mends, MD as PCP - General Sidney Ace, MD (Allergy) Genia Del, MD as Attending Physician (Obstetrics and Gynecology) Judi Saa, DO as Attending Physician (Sports Medicine) Patient Instructions  Good to see you today . Normal  exam.   Lab pending . Attention to lifestyle intervention healthy eating and exercise .  Get more sleep.   Neta Mends. Jaevon Paras M.D.

## 2022-01-15 ENCOUNTER — Ambulatory Visit (INDEPENDENT_AMBULATORY_CARE_PROVIDER_SITE_OTHER): Payer: 59

## 2022-01-15 DIAGNOSIS — J309 Allergic rhinitis, unspecified: Secondary | ICD-10-CM | POA: Diagnosis not present

## 2022-01-18 ENCOUNTER — Ambulatory Visit (INDEPENDENT_AMBULATORY_CARE_PROVIDER_SITE_OTHER): Payer: 59 | Admitting: *Deleted

## 2022-01-18 DIAGNOSIS — J309 Allergic rhinitis, unspecified: Secondary | ICD-10-CM | POA: Diagnosis not present

## 2022-01-25 ENCOUNTER — Ambulatory Visit (INDEPENDENT_AMBULATORY_CARE_PROVIDER_SITE_OTHER): Payer: 59

## 2022-01-25 DIAGNOSIS — J309 Allergic rhinitis, unspecified: Secondary | ICD-10-CM | POA: Diagnosis not present

## 2022-01-29 ENCOUNTER — Ambulatory Visit (INDEPENDENT_AMBULATORY_CARE_PROVIDER_SITE_OTHER): Payer: 59

## 2022-01-29 DIAGNOSIS — J309 Allergic rhinitis, unspecified: Secondary | ICD-10-CM | POA: Diagnosis not present

## 2022-02-26 ENCOUNTER — Ambulatory Visit (INDEPENDENT_AMBULATORY_CARE_PROVIDER_SITE_OTHER): Payer: 59

## 2022-02-26 DIAGNOSIS — J309 Allergic rhinitis, unspecified: Secondary | ICD-10-CM | POA: Diagnosis not present

## 2022-03-11 ENCOUNTER — Ambulatory Visit (INDEPENDENT_AMBULATORY_CARE_PROVIDER_SITE_OTHER): Payer: 59

## 2022-03-11 DIAGNOSIS — J309 Allergic rhinitis, unspecified: Secondary | ICD-10-CM

## 2022-03-19 ENCOUNTER — Ambulatory Visit (INDEPENDENT_AMBULATORY_CARE_PROVIDER_SITE_OTHER): Payer: 59

## 2022-03-19 DIAGNOSIS — J309 Allergic rhinitis, unspecified: Secondary | ICD-10-CM

## 2022-03-28 ENCOUNTER — Ambulatory Visit (INDEPENDENT_AMBULATORY_CARE_PROVIDER_SITE_OTHER): Payer: 59

## 2022-03-28 DIAGNOSIS — J309 Allergic rhinitis, unspecified: Secondary | ICD-10-CM | POA: Diagnosis not present

## 2022-04-02 ENCOUNTER — Ambulatory Visit (INDEPENDENT_AMBULATORY_CARE_PROVIDER_SITE_OTHER): Payer: 59

## 2022-04-02 DIAGNOSIS — J309 Allergic rhinitis, unspecified: Secondary | ICD-10-CM | POA: Diagnosis not present

## 2022-04-09 ENCOUNTER — Ambulatory Visit (INDEPENDENT_AMBULATORY_CARE_PROVIDER_SITE_OTHER): Payer: 59 | Admitting: *Deleted

## 2022-04-09 DIAGNOSIS — J309 Allergic rhinitis, unspecified: Secondary | ICD-10-CM

## 2022-04-16 ENCOUNTER — Ambulatory Visit (INDEPENDENT_AMBULATORY_CARE_PROVIDER_SITE_OTHER): Payer: 59 | Admitting: *Deleted

## 2022-04-16 DIAGNOSIS — J309 Allergic rhinitis, unspecified: Secondary | ICD-10-CM

## 2022-04-25 ENCOUNTER — Ambulatory Visit (INDEPENDENT_AMBULATORY_CARE_PROVIDER_SITE_OTHER): Payer: 59

## 2022-04-25 DIAGNOSIS — J309 Allergic rhinitis, unspecified: Secondary | ICD-10-CM

## 2022-05-03 ENCOUNTER — Ambulatory Visit (INDEPENDENT_AMBULATORY_CARE_PROVIDER_SITE_OTHER): Payer: 59

## 2022-05-03 DIAGNOSIS — J309 Allergic rhinitis, unspecified: Secondary | ICD-10-CM | POA: Diagnosis not present

## 2022-05-13 ENCOUNTER — Encounter: Payer: Self-pay | Admitting: Family Medicine

## 2022-05-13 ENCOUNTER — Ambulatory Visit: Payer: 59 | Admitting: Family Medicine

## 2022-05-13 ENCOUNTER — Other Ambulatory Visit (HOSPITAL_COMMUNITY): Payer: Self-pay

## 2022-05-13 VITALS — BP 118/70 | HR 86 | Temp 98.5°F | Resp 12 | Ht 62.6 in | Wt 125.2 lb

## 2022-05-13 DIAGNOSIS — R112 Nausea with vomiting, unspecified: Secondary | ICD-10-CM | POA: Diagnosis not present

## 2022-05-13 DIAGNOSIS — K529 Noninfective gastroenteritis and colitis, unspecified: Secondary | ICD-10-CM

## 2022-05-13 LAB — POC INFLUENZA A&B (BINAX/QUICKVUE)
Influenza A, POC: NEGATIVE
Influenza B, POC: NEGATIVE

## 2022-05-13 LAB — POC COVID19 BINAXNOW: SARS Coronavirus 2 Ag: NEGATIVE

## 2022-05-13 MED ORDER — ONDANSETRON 4 MG PO TBDP
4.0000 mg | ORAL_TABLET | Freq: Three times a day (TID) | ORAL | 0 refills | Status: AC | PRN
Start: 2022-05-13 — End: 2022-05-16
  Filled 2022-05-13: qty 9, 3d supply, fill #0

## 2022-05-13 MED ORDER — ONDANSETRON HCL 4 MG/2ML IJ SOLN
4.0000 mg | Freq: Once | INTRAMUSCULAR | Status: AC
  Administered 2022-05-13: 4 mg via INTRAMUSCULAR

## 2022-05-13 NOTE — Patient Instructions (Addendum)
A few things to remember from today's visit:  Acute gastroenteritis - Plan: POC COVID-19, POC Influenza A&B(BINAX/QUICKVUE)  Nausea and vomiting in adult - Plan: ondansetron (ZOFRAN-ODT) 4 MG disintegrating tablet Viral Gastroenteritis, Adult  Viral gastroenteritis is also known as the stomach flu. This condition may affect your stomach, small intestine, and large intestine. It can cause sudden watery diarrhea, fever, and vomiting. This condition is caused by many different viruses. These viruses can be passed from person to person very easily (are contagious). Diarrhea and vomiting can make you feel weak and cause you to become dehydrated. You may not be able to keep fluids down. Dehydration can make you tired and thirsty, cause you to have a dry mouth, and decrease how often you urinate. It is important to replace the fluids that you lose from diarrhea and vomiting. What are the causes? Gastroenteritis is caused by many viruses, including rotavirus and norovirus. Norovirus is the most common cause in adults. You can get sick after being exposed to the viruses from other people. You can also get sick by: Eating food, drinking water, or touching a surface contaminated with one of these viruses. Sharing utensils or other personal items with an infected person. What increases the risk? You are more likely to develop this condition if you: Have a weak body defense system (immune system). Live with one or more children who are younger than 2 years. Live in a nursing home. Travel on cruise ships. What are the signs or symptoms? Symptoms of this condition start suddenly 1-3 days after exposure to a virus. Symptoms may last for a few days or for as long as a week. Common symptoms include watery diarrhea and vomiting. Other symptoms include: Fever. Headache. Fatigue. Pain in the abdomen. Chills. Weakness. Nausea. Muscle aches. Loss of appetite. How is this diagnosed? This condition is  diagnosed with a medical history and physical exam. You may also have a stool test to check for viruses or other infections. How is this treated? This condition typically goes away on its own. The focus of treatment is to prevent dehydration and restore lost fluids (rehydration). This condition may be treated with: An oral rehydration solution (ORS) to replace important salts and minerals (electrolytes) in your body. Take this if told by your health care provider. This is a drink that is sold at pharmacies and retail stores. Medicines to help with your symptoms. Probiotic supplements to reduce symptoms of diarrhea. Fluids given through an IV, if dehydration is severe. Older adults and people with other diseases or a weak immune system are at higher risk for dehydration. Follow these instructions at home: Eating and drinking  Take an ORS as told by your health care provider. Drink clear fluids in small amounts as you are able. Clear fluids include: Water. Ice chips. Diluted fruit juice. Low-calorie sports drinks. Drink enough fluid to keep your urine pale yellow. Eat small amounts of healthy foods every 3-4 hours as you are able. This may include whole grains, fruits, vegetables, lean meats, and yogurt. Avoid fluids that contain a lot of sugar or caffeine, such as energy drinks, sports drinks, and soda. Avoid spicy or fatty foods. Avoid alcohol. General instructions  Wash your hands often, especially after having diarrhea or vomiting. If soap and water are not available, use hand sanitizer. Make sure that all people in your household wash their hands well and often. Take over-the-counter and prescription medicines only as told by your health care provider. Rest at home while you  recover. Watch your condition for any changes. Take a warm bath to relieve any burning or pain from frequent diarrhea episodes. Keep all follow-up visits. This is important. Contact a health care provider if  you: Cannot keep fluids down. Have symptoms that get worse. Have new symptoms. Feel light-headed or dizzy. Have muscle cramps. Get help right away if you: Have chest pain. Have trouble breathing or you are breathing very quickly. Have a fast heartbeat. Feel extremely weak or you faint. Have a severe headache, a stiff neck, or both. Have a rash. Have severe pain, cramping, or bloating in your abdomen. Have skin that feels cold and clammy. Feel confused. Have pain when you urinate. Have signs of dehydration, such as: Dark urine, very little urine, or no urine. Cracked lips. Dry mouth. Sunken eyes. Sleepiness. Weakness. Have signs of bleeding, such as: Seeing blood in your vomit. Having vomit that looks like coffee grounds. Having bloody or black stools or stools that look like tar. These symptoms may be an emergency. Get help right away. Call 911. Do not wait to see if the symptoms will go away. Do not drive yourself to the hospital. Summary Viral gastroenteritis is also known as the stomach flu. It can cause sudden watery diarrhea, fever, and vomiting. This condition can be passed from person to person very easily (is contagious). Take an oral rehydration solution (ORS) if told by your health care provider. This is a drink that is sold at pharmacies and retail stores. Wash your hands often, especially after having diarrhea or vomiting. If soap and water are not available, use hand sanitizer. This information is not intended to replace advice given to you by your health care provider. Make sure you discuss any questions you have with your health care provider. Document Revised: 06/04/2021 Document Reviewed: 06/04/2021 Elsevier Patient Education  [place].  If you need refills for medications you take chronically, please call your pharmacy. Do not use My Chart to request refills or for acute issues that need immediate attention. If you send a my chart message, it may  take a few days to be addressed, specially if I am not in the office.  Please be sure medication list is accurate. If a new problem present, please set up appointment sooner than planned today.

## 2022-05-13 NOTE — Progress Notes (Unsigned)
ACUTE VISIT Chief Complaint  Patient presents with   Vomiting    Started around 1am this morning, having stomach discomfort.    HPI: [name] is a 56 y.o. female, who is here today complaining of < 24 hours of GI symptoms as described above. Started with nausea last night and vomiting today around 1 am. Had food at church and last night at home she had tomatoes juice and "old" cashews before going to bed. Her husband and mother in law ate same food at church and they are not sick.  She had food content initially but her last episode of vomiting around 7 am was yellowish.  Emesis  This is a new problem. The current episode started today. The problem has been gradually improving. The emesis has an appearance of stomach contents. There has been no fever. Associated symptoms include abdominal pain, arthralgias, chills, diarrhea and myalgias. Pertinent negatives include no chest pain, coughing, dizziness, fever, headaches, sweats, URI or weight loss. She has tried nothing for the symptoms.  Had a formed stool and 2 episodes of diarrhea x 2. Abdominal wall soreness and sore throat, both she attributes to vomiting.  She has had coconut water and crackers.  Review of Systems  Constitutional:  Positive for chills. Negative for fever and weight loss.  HENT:  Negative for congestion and rhinorrhea.   Respiratory:  Negative for cough, shortness of breath and wheezing.   Cardiovascular:  Negative for chest pain.  Gastrointestinal:  Positive for abdominal pain, diarrhea, nausea and vomiting. Negative for blood in stool.  Endocrine: Negative for cold intolerance and heat intolerance.  Genitourinary:  Negative for decreased urine volume, dysuria and hematuria.  Musculoskeletal:  Positive for arthralgias and myalgias.  Skin:  Negative for rash.  Neurological:  Negative for dizziness, syncope and headaches.  Hematological:  Negative for adenopathy. Does not bruise/bleed easily.  Rest see  pertinent positives and negatives per HPI.  Current Outpatient Medications on File Prior to Visit  Medication Sig Dispense Refill   APPLE CIDER VINEGAR PO Take by mouth.     Ascorbic Acid (VITAMIN C PO) Take by mouth.     azelastine (ASTELIN) 0.1 % nasal spray Place 1-2 sprays into both nostrils 2 (two) times daily as needed. 30 mL 5   BIOTIN PO Take by mouth.     Blood Pressure Monitoring (OMRON 3 SERIES BP MONITOR) DEVI Use as directed. 1 each 0   cetirizine (ZYRTEC) 10 MG tablet Take 10 mg by mouth daily as needed for allergies.     cholecalciferol (VITAMIN D) 1000 UNITS tablet Take 1,000 Units by mouth daily.     Cyanocobalamin (VITAMIN B-12 PO) Take by mouth.     EPINEPHrine 0.3 mg/0.3 mL IJ SOAJ injection INJECT 0.3 MG (1 SYRINGE) INTO THE MUSCLE ONCE FOR 1 DOSE AS NEEDED FOR LIFE-THREATENING ALLERGIC REACTIONS 2 each 2   Multiple Vitamin (MULTIVITAMIN WITH MINERALS) TABS Take 1 tablet by mouth as needed.     multivitamin-lutein (OCUVITE-LUTEIN) CAPS capsule Take 1 capsule by mouth daily.     Omega-3 Fatty Acids (FISH OIL) 1200 MG CAPS Take 1,200 mg by mouth daily.     TURMERIC PO Take by mouth.     UNABLE TO FIND Med Name: fat burner     valACYclovir (VALTREX) 1000 MG tablet Take 2 TABLETS BY MOUTH AND REPEAT IN 12 HOURS OR AS DIRECTED (Patient taking differently: Take 2 TABLETS BY MOUTH AND REPEAT IN 12 HOURS OR AS DIRECTED)  30 tablet 0   zinc gluconate 50 MG tablet Take 50 mg by mouth daily.     No current facility-administered medications on file prior to visit.   Past Medical History:  Diagnosis Date   Allergy    cats   Anemia    NOS   Female fertility problem    hx of treatment   GERD (gastroesophageal reflux disease)    HSV infection    labialis   RECTAL BLEEDING 07/10/2007   Qualifier: Diagnosis of  By: Regis Bill MD, [facility] Brooking 2008 colonoscopy [facility].    Allergies  Allergen Reactions   Augmentin [Amoxicillin-Pot Clavulanate] Diarrhea   Ceftin [Cefuroxime Axetil]  Other (See Comments)    Severe yeast infection   Nsaids Nausea Only and Other (See Comments)    Abdominal pain / severe reflux/chest pain   Social History   Socioeconomic History   Marital status: Married    Spouse name: Not on file   Number of children: Not on file   Years of education: Not on file   Highest education level: Not on file  Occupational History   Occupation: PA    Employer: [employer]    Comment: Rehab  Tobacco Use   Smoking status: Never   Smokeless tobacco: Never  Vaping Use   Vaping Use: Never used  Substance and Sexual Activity   Alcohol use: Not Currently    Comment: ONCE Q 2 MONTHS   Drug use: No   Sexual activity: Yes    Partners: Male    Birth control/protection: Post-menopausal    Comment: 1st intercourse- 89, partners- 57, married- 45 yrs   Other Topics Concern   Not on file  Social History Narrative   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  Physical Exam Vitals and nursing note reviewed.  Constitutional:      General: She is not in acute distress.    Appearance: She is well-developed, well-groomed and normal weight. She is not ill-appearing.  HENT:     Head: Normocephalic and atraumatic.     Mouth/Throat:     Mouth: Mucous membranes are moist.     Pharynx: Oropharynx is clear.  Eyes:     General: No scleral icterus.    Conjunctiva/sclera: Conjunctivae normal.  Cardiovascular:     Rate and Rhythm: Normal rate and regular rhythm.     Heart sounds: No murmur  heard. Pulmonary:     Effort: Pulmonary effort is normal. No respiratory distress.     Breath sounds: Normal breath sounds.  Abdominal:     General: Bowel sounds are normal. There is no distension.     Palpations: Abdomen is soft. There is no hepatomegaly or mass.     Tenderness: There is no abdominal tenderness.  Lymphadenopathy:     Cervical: No cervical adenopathy.     Upper Body:     Right upper body:  No supraclavicular adenopathy.     Left upper body: No supraclavicular adenopathy.  Skin:    General: Skin is warm.     Findings: No erythema or rash.  Neurological:     Mental Status: She is alert and oriented to person, place, and time.  Psychiatric:        Mood and Affect: Mood and affect normal.   ASSESSMENT AND PLAN:  [name] was seen today for vomiting.  Diagnoses and all orders for this visit: Orders Placed This Encounter  Procedures   POC COVID-19   POC Influenza A&B(BINAX/QUICKVUE)    Acute gastroenteritis Most likely viral etiology, so symptomatic treatment recommended for now. Rapid flu and COVID 19 test negative. Other possible causes discussed but at this time I do not think further work-up is necessary. Monitor for new symptoms. OTC Imodium could be used but I do not recommend unless 6 or more stools daily. Oral hydration, diluted apple juice or Pedialyte are good options. Bland and light diet if tolerated. Clearly instructed about warning signs. F/U as needed. Recommend repeating COVID 19 test at home in 2 days oif still sick,before if fever presents.  Nausea and vomiting in adult Here in the office and after verbal consent, she received Zofran 4 mg IM x 1 and stayed in observation for 15 min. She will continue with oral Zofran 4 mg tid prn. Continue adequate hydration, small sips at the time.  -     ondansetron (ZOFRAN-ODT) 4 MG disintegrating tablet; Take 1 tablet (4 mg total) by mouth every 8 (eight) hours as needed for up to 3 days for nausea or  vomiting. -     ondansetron (ZOFRAN) injection 4 mg  No follow-ups on file.  Daud Cayer G. [place], MD  [facility]. [place] office.

## 2022-05-14 ENCOUNTER — Ambulatory Visit
Admission: RE | Admit: 2022-05-14 | Discharge: 2022-05-14 | Disposition: A | Discharge status: Home / Self Care | Payer: 59 | Source: Ambulatory Visit | Attending: Internal Medicine | Admitting: Internal Medicine

## 2022-05-14 ENCOUNTER — Other Ambulatory Visit: Payer: Self-pay | Admitting: Internal Medicine

## 2022-05-14 DIAGNOSIS — Z1231 Encounter for screening mammogram for malignant neoplasm of breast: Secondary | ICD-10-CM | POA: Diagnosis not present

## 2022-05-16 ENCOUNTER — Encounter: Payer: Self-pay | Admitting: Family Medicine

## 2022-06-20 ENCOUNTER — Ambulatory Visit (INDEPENDENT_AMBULATORY_CARE_PROVIDER_SITE_OTHER): Payer: 59

## 2022-06-20 DIAGNOSIS — J309 Allergic rhinitis, unspecified: Secondary | ICD-10-CM | POA: Diagnosis not present

## 2022-06-25 ENCOUNTER — Ambulatory Visit (INDEPENDENT_AMBULATORY_CARE_PROVIDER_SITE_OTHER): Payer: 59

## 2022-06-25 DIAGNOSIS — J309 Allergic rhinitis, unspecified: Secondary | ICD-10-CM

## 2022-07-29 ENCOUNTER — Ambulatory Visit (INDEPENDENT_AMBULATORY_CARE_PROVIDER_SITE_OTHER): Payer: 59

## 2022-07-29 DIAGNOSIS — J309 Allergic rhinitis, unspecified: Secondary | ICD-10-CM | POA: Diagnosis not present

## 2022-08-22 DIAGNOSIS — J3081 Allergic rhinitis due to animal (cat) (dog) hair and dander: Secondary | ICD-10-CM

## 2022-08-23 NOTE — Progress Notes (Signed)
VIAL EXP 08-24-23

## 2022-08-28 ENCOUNTER — Ambulatory Visit: Payer: Self-pay

## 2022-09-03 ENCOUNTER — Encounter: Payer: Self-pay | Admitting: Obstetrics & Gynecology

## 2022-09-03 ENCOUNTER — Ambulatory Visit: Payer: Commercial Managed Care - PPO | Admitting: Obstetrics & Gynecology

## 2022-09-03 ENCOUNTER — Ambulatory Visit (INDEPENDENT_AMBULATORY_CARE_PROVIDER_SITE_OTHER): Payer: Commercial Managed Care - PPO

## 2022-09-03 VITALS — BP 110/74 | HR 80 | Ht 62.75 in | Wt 130.0 lb

## 2022-09-03 DIAGNOSIS — J309 Allergic rhinitis, unspecified: Secondary | ICD-10-CM | POA: Diagnosis not present

## 2022-09-03 DIAGNOSIS — Z780 Asymptomatic menopausal state: Secondary | ICD-10-CM | POA: Diagnosis not present

## 2022-09-03 DIAGNOSIS — Z01419 Encounter for gynecological examination (general) (routine) without abnormal findings: Secondary | ICD-10-CM

## 2022-09-03 NOTE — Progress Notes (Signed)
Valerie [name] 02-22-1966 [id number]   History:    57 y.o. G0 Married   RP:  Established patient presenting for annual gyn exam    HPI: Postmenopause, well on no HRT.  No PMB.  No pelvic pain.  No pain with IC.  No h/o abnormal Pap.  Last Pap Neg 05/2021.  Will repeat Pap at 3 years.  Urine/BMs normal. Colono 2017.  Breasts normal. Mammo Neg 04/2022.  BMI 23.21.  Not exercising as regularly as usual.  Would like to do a Bone Density.  Health labs with Fam MD. Flu vaccine at work.  Past medical history,surgical history, family history and social history were all reviewed and documented in the EPIC chart.  Gynecologic History Patient's last menstrual period was 11/28/2014.  Obstetric History OB History  Gravida Para Term Preterm AB Living  0 0 0 0 0 0  SAB IAB Ectopic Multiple Live Births  0 0 0 0 0     ROS: A ROS was performed and pertinent positives and negatives are included in the history. GENERAL: No fevers or chills. HEENT: No change in vision, no earache, sore throat or sinus congestion. NECK: No pain or stiffness. CARDIOVASCULAR: No chest pain or pressure. No palpitations. PULMONARY: No shortness of breath, cough or wheeze. GASTROINTESTINAL: No abdominal pain, nausea, vomiting or diarrhea, melena or bright red blood per rectum. GENITOURINARY: No urinary frequency, urgency, hesitancy or dysuria. MUSCULOSKELETAL: No joint or muscle pain, no back pain, no recent trauma. DERMATOLOGIC: No rash, no itching, no lesions. ENDOCRINE: No polyuria, polydipsia, no heat or cold intolerance. No recent change in weight. HEMATOLOGICAL: No anemia or easy bruising or bleeding. NEUROLOGIC: No headache, seizures, numbness, tingling or weakness. PSYCHIATRIC: No depression, no loss of interest in normal activity or change in sleep pattern.     Exam:   BP 110/74   Pulse 80   Ht 5' 2.75" (1.594 m)   Wt 130 lb (59 kg)   LMP 11/28/2014 Comment: SA  SpO2 99%   BMI 23.21 kg/m   Body mass index is  23.21 kg/m.  General appearance : Well developed well nourished female. No acute distress HEENT: Eyes: no retinal hemorrhage or exudates,  Neck supple, trachea midline, no carotid bruits, no thyroidmegaly Lungs: Clear to auscultation, no rhonchi or wheezes, or rib retractions  Heart: Regular rate and rhythm, no murmurs or gallops Breast:Examined in sitting and supine position were symmetrical in appearance, no palpable masses or tenderness,  no skin retraction, no nipple inversion, no nipple discharge, no skin discoloration, no axillary or supraclavicular lymphadenopathy Abdomen: no palpable masses or tenderness, no rebound or guarding Extremities: no edema or skin discoloration or tenderness  Pelvic: Vulva: Normal             Vagina: No gross lesions or discharge  Cervix: No gross lesions or discharge  Uterus  AV, normal size, shape and consistency, non-tender and mobile  Adnexa  Without masses or tenderness  Anus: Normal   Assessment/Plan:  57 y.o. female for annual exam   1. Well female exam with routine gynecological exam Postmenopause, well on no HRT.  No PMB.  No pelvic pain. No pain with IC.  No h/o abnormal Pap.  Last Pap Neg 05/2021.  Will repeat Pap at 3 years.  Urine/BMs normal. Colono 2017.  Breasts normal. Mammo Neg 04/2022.  BMI 23.21.  Not exercising as regularly as usual.  Would like to do a Bone Density.  Health labs with Fam MD. Flu vaccine  at work.  2. Postmenopause Postmenopause x 10 years, well on no HRT.  No PMB.  No pelvic pain. No pain with IC. BMI 23.21.  Not exercising as regularly as usual.  Would like to do a Bone Density. [facility]  Vit D, Ca++ 1.5 g/d total, increase wt bearing activities. - DG Bone Density; Future at the [place].  Princess Bruins MD, 4:19 PM

## 2022-09-12 ENCOUNTER — Ambulatory Visit (INDEPENDENT_AMBULATORY_CARE_PROVIDER_SITE_OTHER): Payer: Commercial Managed Care - PPO

## 2022-09-12 DIAGNOSIS — J309 Allergic rhinitis, unspecified: Secondary | ICD-10-CM

## 2022-09-19 ENCOUNTER — Other Ambulatory Visit (HOSPITAL_COMMUNITY): Payer: Self-pay

## 2022-09-19 ENCOUNTER — Other Ambulatory Visit: Payer: Self-pay | Admitting: Internal Medicine

## 2022-09-19 MED ORDER — VALACYCLOVIR HCL 1 G PO TABS
2000.0000 mg | ORAL_TABLET | ORAL | 0 refills | Status: AC
  Filled 2022-09-19: qty 30, 15d supply, fill #0
  Filled 2022-09-30: qty 30, 10d supply, fill #0

## 2022-09-24 ENCOUNTER — Ambulatory Visit (INDEPENDENT_AMBULATORY_CARE_PROVIDER_SITE_OTHER): Payer: Commercial Managed Care - PPO

## 2022-09-24 DIAGNOSIS — J309 Allergic rhinitis, unspecified: Secondary | ICD-10-CM | POA: Diagnosis not present

## 2022-09-30 ENCOUNTER — Ambulatory Visit (INDEPENDENT_AMBULATORY_CARE_PROVIDER_SITE_OTHER): Payer: Commercial Managed Care - PPO | Admitting: *Deleted

## 2022-09-30 ENCOUNTER — Other Ambulatory Visit (HOSPITAL_COMMUNITY): Payer: Self-pay

## 2022-09-30 DIAGNOSIS — J309 Allergic rhinitis, unspecified: Secondary | ICD-10-CM | POA: Diagnosis not present

## 2022-09-30 DIAGNOSIS — H52223 Regular astigmatism, bilateral: Secondary | ICD-10-CM | POA: Diagnosis not present

## 2022-10-04 ENCOUNTER — Other Ambulatory Visit (HOSPITAL_COMMUNITY): Payer: Self-pay

## 2022-10-07 ENCOUNTER — Other Ambulatory Visit (HOSPITAL_COMMUNITY): Payer: Self-pay

## 2022-10-17 ENCOUNTER — Ambulatory Visit (INDEPENDENT_AMBULATORY_CARE_PROVIDER_SITE_OTHER): Payer: Commercial Managed Care - PPO

## 2022-10-17 DIAGNOSIS — J309 Allergic rhinitis, unspecified: Secondary | ICD-10-CM

## 2022-10-22 ENCOUNTER — Ambulatory Visit (INDEPENDENT_AMBULATORY_CARE_PROVIDER_SITE_OTHER): Payer: Commercial Managed Care - PPO

## 2022-10-22 DIAGNOSIS — J309 Allergic rhinitis, unspecified: Secondary | ICD-10-CM

## 2022-10-24 ENCOUNTER — Ambulatory Visit: Payer: Commercial Managed Care - PPO | Admitting: Allergy

## 2022-10-24 ENCOUNTER — Encounter: Payer: Self-pay | Admitting: Allergy

## 2022-10-24 ENCOUNTER — Other Ambulatory Visit: Payer: Self-pay

## 2022-10-24 VITALS — BP 116/70 | HR 62 | Temp 97.9°F | Resp 14 | Ht 62.75 in | Wt 132.2 lb

## 2022-10-24 DIAGNOSIS — J309 Allergic rhinitis, unspecified: Secondary | ICD-10-CM

## 2022-10-24 DIAGNOSIS — J3089 Other allergic rhinitis: Secondary | ICD-10-CM

## 2022-10-24 MED ORDER — EPINEPHRINE 0.3 MG/0.3ML IJ SOAJ: 0.3000 mg | INTRAMUSCULAR | 1 refills | Status: DC | PRN

## 2022-10-24 MED ORDER — RYALTRIS 665-25 MCG/ACT NA SUSP: 2.0000 | Freq: Two times a day (BID) | NASAL | 5 refills | Status: DC | PRN

## 2022-10-24 NOTE — Progress Notes (Signed)
Follow-up Note  RE: Valerie [name] MRN: [id number] DOB: 12/16/1965 Date of Office Visit: 10/24/2022   History of present illness: Valerie [name] is a 57 y.o. female presenting today for follow-up of allergic rhinitis.  She was last seen in the office on 08/09/21 by myself.  She has not had any major health changes, surgeries or hospitalizations since this time.  She has been dealing with an eye issue with what looks to be a stye of the right upper eyelid.  It is currently getting treated she states it is improving. Otherwise with her allergies she is on immunotherapy and she does tolerate the injections well.  She has not been very consistent with her immunotherapy but she is currently in our green vial which is our next the last vial before the maintenance vial.  She does report she will be more consistent with use so that she can get to the maintenance vial.  She does report having some itchy eyes and runny nose and nasal itch.  She does use Zyrtec daily and this still seems to be effective for her.  She has used Astelin for nasal drainage control.  Review of systems: Review of Systems  Constitutional: Negative.   HENT:         See HPI  Eyes:        See HPI  Respiratory: Negative.    Cardiovascular: Negative.   Gastrointestinal: Negative.   Musculoskeletal: Negative.   Skin: Negative.   Allergic/Immunologic: Negative.   Neurological: Negative.      All other systems negative unless noted above in HPI  Past medical/social/surgical/family history have been reviewed and are unchanged unless specifically indicated below.  No changes  Medication List: Current Outpatient Medications  Medication Sig Dispense Refill   APPLE CIDER VINEGAR PO Take by mouth.     Ascorbic Acid (VITAMIN C PO) Take by mouth.     cetirizine (ZYRTEC) 10 MG tablet Take 10 mg by mouth daily as needed for allergies.     cholecalciferol (VITAMIN D) 1000 UNITS tablet Take 1,000 Units by mouth daily.      Cyanocobalamin (VITAMIN B-12 PO) Take by mouth.     EPINEPHrine (EPIPEN 2-PAK) 0.3 mg/0.3 mL IJ SOAJ injection Inject 0.3 mg into the muscle as needed for anaphylaxis. 0.3 mL 1   EPINEPHrine 0.3 mg/0.3 mL IJ SOAJ injection INJECT 0.3 MG (1 SYRINGE) INTO THE MUSCLE ONCE FOR 1 DOSE AS NEEDED FOR LIFE-THREATENING ALLERGIC REACTIONS 2 each 2   multivitamin-lutein (OCUVITE-LUTEIN) CAPS capsule Take 1 capsule by mouth daily.     Olopatadine-Mometasone (RYALTRIS) [id number] MCG/ACT SUSP Place 2 sprays into the nose 2 (two) times daily as needed (runny or stuffy nose). 29 g 5   Omega-3 Fatty Acids (FISH OIL) 1200 MG CAPS Take 1,200 mg by mouth daily.     TURMERIC PO Take by mouth.     valACYclovir (VALTREX) 1000 MG tablet Take 2 tablets (2,000 mg total) by mouth and repeat in 12 hours or as directed 30 tablet 0   zinc gluconate 50 MG tablet Take 50 mg by mouth daily.     BIOTIN PO Take by mouth.     No current facility-administered medications for this visit.     Known medication allergies: Allergies  Allergen Reactions   Augmentin [Amoxicillin-Pot Clavulanate] Diarrhea   Ceftin [Cefuroxime Axetil] Other (See Comments)    Severe yeast infection   Nsaids Nausea Only and Other (See Comments)    Abdominal pain /  severe reflux/chest pain     Physical examination: Blood pressure 116/70, pulse 62, temperature 97.9 F (36.6 C), temperature source Temporal, resp. rate 14, height 5' 2.75" (1.594 m), weight 132 lb 3.2 oz (60 kg), last menstrual period 11/28/2014, SpO2 98 %.  General: Alert, interactive, in no acute distress. HEENT: PERRLA, right upper lid with a papule, TMs pearly gray, turbinates minimally edematous without discharge, post-pharynx non erythematous. Neck: Supple without lymphadenopathy. Lungs: Clear to auscultation without wheezing, rhonchi or rales. {no increased work of breathing. CV: Normal S1, S2 without murmurs. Abdomen: Nondistended, nontender. Skin: Warm and dry, without lesions  or rashes. Extremities:  No clubbing, cyanosis or edema. Neuro:   Grossly intact.  Diagnositics/Labs: Immunotherapy injection provided today   Assessment and plan:   Allergic rhinitis  - continue appropriate allergen avoidance measures  - continue allergen immunotherapy (allergy shots) per protocol and have access to your epinephrine device.  Getting close to maintenance dosing.  - consider performing nasal saline rinse 3-7 days a week to help keep nose/sinuses clear  - continue daily antihistamine Zyrtec 10mg  or Xyzal 5mg  as needed.   If needed can take additional dose around your injections to help with itching of the back  - Provided with Ryaltris nasal spray that helps with both nasal congestion and drainage control.  You can use Ryaltris 2 sprays each nostril twice a day as needed.   Follow-up 12 months or sooner if needed  I appreciate the opportunity to take part in Jabrea's care. Please do not hesitate to contact me with questions.  Sincerely,   Prudy Feeler, MD Allergy/Immunology [facility]

## 2022-10-24 NOTE — Patient Instructions (Addendum)
Allergies   - continue appropriate allergen avoidance measures  - continue allergen immunotherapy (allergy shots) per protocol and have access to your epinephrine device.  Getting close to maintenance dosing.  - consider performing nasal saline rinse 3-7 days a week to help keep nose/sinuses clear  - continue daily antihistamine Zyrtec 10mg  or Xyzal 5mg  as needed.   If needed can take additional dose around your injections to help with itching of the back  - Provided with Ryaltris nasal spray that helps with both nasal congestion and drainage control.  You can use Ryaltris 2 sprays each nostril twice a day as needed.   Follow-up 12 months or sooner if needed

## 2022-10-29 ENCOUNTER — Other Ambulatory Visit (HOSPITAL_COMMUNITY): Payer: Self-pay

## 2022-11-05 ENCOUNTER — Ambulatory Visit (INDEPENDENT_AMBULATORY_CARE_PROVIDER_SITE_OTHER): Payer: Commercial Managed Care - PPO

## 2022-11-05 DIAGNOSIS — J309 Allergic rhinitis, unspecified: Secondary | ICD-10-CM | POA: Diagnosis not present

## 2022-11-14 ENCOUNTER — Ambulatory Visit (INDEPENDENT_AMBULATORY_CARE_PROVIDER_SITE_OTHER): Payer: Commercial Managed Care - PPO

## 2022-11-14 DIAGNOSIS — J309 Allergic rhinitis, unspecified: Secondary | ICD-10-CM

## 2022-11-21 ENCOUNTER — Other Ambulatory Visit (HOSPITAL_COMMUNITY): Payer: Self-pay

## 2022-11-21 MED ORDER — TRIAMCINOLONE ACETONIDE 0.1 % MT PSTE
PASTE | Freq: Three times a day (TID) | OROMUCOSAL | 10 refills | Status: DC
  Filled 2022-11-21: qty 5, 7d supply, fill #0

## 2022-11-28 ENCOUNTER — Ambulatory Visit (INDEPENDENT_AMBULATORY_CARE_PROVIDER_SITE_OTHER): Payer: Commercial Managed Care - PPO

## 2022-11-28 DIAGNOSIS — J309 Allergic rhinitis, unspecified: Secondary | ICD-10-CM

## 2022-11-29 ENCOUNTER — Other Ambulatory Visit (HOSPITAL_COMMUNITY): Payer: Self-pay

## 2022-12-05 ENCOUNTER — Ambulatory Visit (INDEPENDENT_AMBULATORY_CARE_PROVIDER_SITE_OTHER): Payer: Commercial Managed Care - PPO

## 2022-12-05 DIAGNOSIS — J309 Allergic rhinitis, unspecified: Secondary | ICD-10-CM | POA: Diagnosis not present

## 2022-12-19 ENCOUNTER — Ambulatory Visit (INDEPENDENT_AMBULATORY_CARE_PROVIDER_SITE_OTHER): Payer: Commercial Managed Care - PPO

## 2022-12-19 DIAGNOSIS — J309 Allergic rhinitis, unspecified: Secondary | ICD-10-CM

## 2022-12-26 ENCOUNTER — Ambulatory Visit (INDEPENDENT_AMBULATORY_CARE_PROVIDER_SITE_OTHER): Payer: Commercial Managed Care - PPO

## 2022-12-26 DIAGNOSIS — J309 Allergic rhinitis, unspecified: Secondary | ICD-10-CM | POA: Diagnosis not present

## 2022-12-31 ENCOUNTER — Encounter: Payer: Self-pay | Admitting: Family

## 2022-12-31 ENCOUNTER — Other Ambulatory Visit (HOSPITAL_COMMUNITY): Payer: Self-pay

## 2022-12-31 ENCOUNTER — Ambulatory Visit: Payer: Commercial Managed Care - PPO | Admitting: Family

## 2022-12-31 VITALS — BP 120/66 | HR 85 | Temp 98.2°F | Ht 62.75 in | Wt 132.7 lb

## 2022-12-31 DIAGNOSIS — B9689 Other specified bacterial agents as the cause of diseases classified elsewhere: Secondary | ICD-10-CM

## 2022-12-31 DIAGNOSIS — H9201 Otalgia, right ear: Secondary | ICD-10-CM

## 2022-12-31 DIAGNOSIS — J019 Acute sinusitis, unspecified: Secondary | ICD-10-CM | POA: Diagnosis not present

## 2022-12-31 MED ORDER — METHYLPREDNISOLONE 4 MG PO TBPK
ORAL_TABLET | ORAL | 0 refills | Status: DC
  Filled 2022-12-31: qty 21, 6d supply, fill #0

## 2022-12-31 MED ORDER — AMOXICILLIN 500 MG PO CAPS
500.0000 mg | ORAL_CAPSULE | Freq: Three times a day (TID) | ORAL | 0 refills | Status: AC
  Filled 2022-12-31: qty 30, 10d supply, fill #0

## 2022-12-31 NOTE — Progress Notes (Signed)
Acute Office Visit  Subjective:     Patient ID: Valerie [name], female    DOB: 1965-09-06, 57 y.o.   MRN: [id number]  Chief Complaint  Patient presents with  . Ear Pain    Patient complains of right ear pain, x4 weeks   . Headache    Patient complains of headaches, x1 week     HPI Patient is in today pain behind her right ear that has gotten worse over the last 4 weeks.  She reports getting new eyeglasses about 6 to 8 weeks ago and initially thought that it was the pressure from the eyeglasses.  However, she has noticed that the pain is slightly above where her eyeglasses go behind her right ear.  She describes it as an aching, annoying pain.  Also reports having allergy issues that have worsened to include sneezing, cough, congestion, sinus pressure and pain.  This has been ongoing for several weeks.  Has been taking Xyzal, Sudafed and Benadryl that has helped some but the symptoms persist.  Review of Systems  HENT:  Positive for congestion and sinus pain.        Pain behind the right ear  Psychiatric/Behavioral: Negative.    All other systems reviewed and are negative. Past Medical History:  Diagnosis Date  . Allergy    cats  . Anemia    NOS  . 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 GERD (gastroesophageal reflux disease)   . HSV infection    labialis  . RECTAL BLEEDING 07/10/2007   Qualifier: Diagnosis of  By: Fabian Sharp MD, Neta Mends 2008 colonoscopy [facility].     Social History   Socioeconomic History  . Marital status: Married    Spouse name: Not on file  . Number of children: Not on file  . Years of education: Not on file  . Highest education level: Not on file  Occupational History  . Occupation: PA    Employer: [employer]    Comment: Rehab  Tobacco Use  . Smoking status: Never  . Smokeless tobacco: Never  Vaping Use  . Vaping Use: Never used  Substance and Sexual Activity  . Alcohol use: Yes    Comment: once every  . Drug use: No  . Sexual  activity: Yes    Partners: Male    Birth control/protection: Post-menopausal    Comment: 1st intercourse- 29, partners- 1  Other Topics Concern  . 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 CYST REMOVAL HAND Right   . DILITATION & CURRETTAGE/HYSTROSCOPY WITH VERSAPOINT RESECTION  08/27/2012   Procedure: DILATATION & CURETTAGE/HYSTEROSCOPY WITH VERSAPOINT RESECTION;  Surgeon: Genia Del, MD;  Location: [facility];  Service: Gynecology;;  . LASIK  2009   mono vision  . TRIGGER FINGER RELEASE Right 06/02/2017   Procedure: RELEASE TRIGGER FINGER/A-1 PULLEY RIGHT RING TRIGGER RELEASE;  Surgeon: Betha Loa, MD;  Location:  [facility];  Service: Orthopedics;  Laterality: Right;    Family History  Problem Relation Age of Onset  . Hypertension Mother   . Stroke Mother   . Allergic rhinitis Mother   . Diabetes Father   . Hyperlipidemia Father   . Heart disease Father   . Stroke Father   . Colon polyps Brother        elev TG  . Allergic rhinitis Brother   . Breast cancer Maternal Aunt        diagnosed in her 31's  . Liver disease Maternal Uncle        alcohol related  . Stroke Maternal Grandmother   . Diabetes Maternal Grandmother   . Heart disease Maternal Grandmother   . Diabetes Maternal Grandfather   . Alcohol abuse Other     Allergies  Allergen Reactions  . Augmentin [Amoxicillin-Pot Clavulanate] Diarrhea  . Ceftin [Cefuroxime Axetil] Other (See Comments)    Severe yeast infection  .  Nsaids Nausea Only and Other (See Comments)    Abdominal pain / severe reflux/chest pain    Current Outpatient Medications on File Prior to Visit  Medication Sig Dispense Refill  . APPLE CIDER VINEGAR PO Take by mouth.    . Ascorbic Acid (VITAMIN C PO) Take by mouth.    . cetirizine (ZYRTEC) 10 MG tablet Take 10 mg by mouth daily as needed for allergies.    . cholecalciferol (VITAMIN D) 1000 UNITS tablet Take 1,000 Units by mouth daily.    . Cyanocobalamin (VITAMIN B-12 PO) Take by mouth.    . EPINEPHrine (EPIPEN 2-PAK) 0.3 mg/0.3 mL IJ SOAJ injection Inject 0.3 mg into the muscle as needed for anaphylaxis. 0.3 mL 1  . EPINEPHrine 0.3 mg/0.3 mL IJ SOAJ injection INJECT 0.3 MG (1 SYRINGE) INTO THE MUSCLE ONCE FOR 1 DOSE AS NEEDED FOR LIFE-THREATENING ALLERGIC REACTIONS 2 each 2  . multivitamin-lutein (OCUVITE-LUTEIN) CAPS capsule Take 1 capsule by mouth daily.    . Olopatadine-Mometasone (RYALTRIS) X543819 MCG/ACT SUSP Place 2 sprays into the nose 2 (two) times daily as needed (runny or stuffy nose). 29 g 5  . Omega-3 Fatty Acids (FISH OIL) 1200 MG CAPS Take 1,200 mg by mouth daily.    Marland Kitchen triamcinolone (KENALOG) 0.1 % paste Apply thin layer to affected areas up to three times a day. Press small dab to lesion until thin film develops, do not rub in 5 g 10  . TURMERIC PO Take by mouth.    . valACYclovir (VALTREX) 1000 MG tablet Take 2 tablets (2,000 mg total) by mouth and repeat in 12 hours or as directed 30 tablet 0  . zinc gluconate 50 MG tablet Take 50 mg by mouth daily.     No current facility-administered medications on file prior to visit.    BP 120/66 (BP Location: Left Arm, Patient Position: Sitting, Cuff Size: Normal)   Pulse 85   Temp 98.2 F (36.8 C) (Oral)   Ht 5' 2.75" (1.594 m)   Wt 132 lb 11.2 oz (60.2 kg)   LMP 11/28/2014 Comment: SA  SpO2 99%   BMI 23.69 kg/m chart      Objective:    BP 120/66 (BP Location: Left Arm, Patient Position: Sitting, Cuff Size: Normal)    Pulse 85   Temp 98.2 F (36.8 C) (Oral)   Ht 5' 2.75" (1.594 m)   Wt 132 lb 11.2 oz (60.2 kg)   LMP 11/28/2014 Comment: SA  SpO2 99%   BMI  23.69 kg/m    Physical Exam Vitals reviewed.  Constitutional:      Appearance: She is well-developed and normal weight.  HENT:     Right Ear: Hearing, tympanic membrane, ear canal and external ear normal.     Left Ear: Hearing, tympanic membrane, ear canal and external ear normal.     Ears:      Comments: Tenderness behind the right ear    Nose:     Right Sinus: Maxillary sinus tenderness and frontal sinus tenderness present.     Left Sinus: No maxillary sinus tenderness or frontal sinus tenderness.     Mouth/Throat:     Mouth: Mucous membranes are moist.  Eyes:     Extraocular Movements: Extraocular movements intact.     Pupils: Pupils are equal, round, and reactive to light.  Cardiovascular:     Rate and Rhythm: Normal rate and regular rhythm.     Pulses: Normal pulses.     Heart sounds: Normal heart sounds.  Pulmonary:     Effort: Pulmonary effort is normal.     Breath sounds: Normal breath sounds.  Musculoskeletal:        General: Normal range of motion.  Skin:    General: Skin is warm and dry.  Neurological:     General: No focal deficit present.     Mental Status: She is alert and oriented to person, place, and time.  Psychiatric:        Mood and Affect: Mood normal.        Behavior: Behavior normal.   No results found for any visits on 12/31/22.      Assessment & Plan:   Problem List Items Addressed This Visit   None Visit Diagnoses     Acute bacterial sinusitis    -  Primary   Relevant Medications   methylPREDNISolone (MEDROL DOSEPAK) 4 MG TBPK tablet   amoxicillin (AMOXIL) 500 MG capsule   Ear pain, right           Meds ordered this encounter  Medications  . methylPREDNISolone (MEDROL DOSEPAK) 4 MG TBPK tablet    Sig: As directed    Dispense:  21 tablet    Refill:  0  . amoxicillin (AMOXIL) 500 MG  capsule    Sig: Take 1 capsule (500 mg total) by mouth 3 (three) times daily for 10 days.    Dispense:  30 capsule    Refill:  0   I suspect that the pain behind her ear is referred from her new eyeglasses.  They may be too snug behind her ear.  Continue Xyzal.  Will treat with a Medrol Dosepak and amoxicillin.  Advised patient to call the office if symptoms worsen or persist.  Recheck as scheduled and sooner as needed. No follow-ups on file.  Eulis Foster, FNP

## 2023-01-09 ENCOUNTER — Ambulatory Visit (INDEPENDENT_AMBULATORY_CARE_PROVIDER_SITE_OTHER): Payer: Commercial Managed Care - PPO

## 2023-01-09 DIAGNOSIS — J309 Allergic rhinitis, unspecified: Secondary | ICD-10-CM

## 2023-01-16 ENCOUNTER — Encounter: Payer: Self-pay | Admitting: Allergy

## 2023-01-16 ENCOUNTER — Other Ambulatory Visit (HOSPITAL_COMMUNITY): Payer: Self-pay

## 2023-01-16 ENCOUNTER — Other Ambulatory Visit: Payer: Self-pay

## 2023-01-16 ENCOUNTER — Ambulatory Visit: Payer: Commercial Managed Care - PPO | Admitting: Allergy

## 2023-01-16 VITALS — BP 108/68 | HR 71 | Temp 98.6°F | Resp 14

## 2023-01-16 DIAGNOSIS — J328 Other chronic sinusitis: Secondary | ICD-10-CM

## 2023-01-16 DIAGNOSIS — J3089 Other allergic rhinitis: Secondary | ICD-10-CM | POA: Diagnosis not present

## 2023-01-16 MED ORDER — DOXYCYCLINE MONOHYDRATE 100 MG PO TABS
100.0000 mg | ORAL_TABLET | Freq: Two times a day (BID) | ORAL | 0 refills | Status: AC
  Filled 2023-01-16: qty 28, 14d supply, fill #0

## 2023-01-16 NOTE — Progress Notes (Signed)
Follow-up Note  RE: Valerie [name] MRN: [id number] DOB: 27-Jul-1966 Date of Office Visit: 01/16/2023   History of present illness: Valerie [name] is a 57 y.o. female presenting today for sick visit.  She has history of allergic rhinitis on immunotherapy.  She was last seen in the on 10/24/2022 by myself.  She has been having a aching pain on the right side of head in the mastoid area that she is now starting to feel on left side.  She also reports having sinus pain and headache.  She reports having these symptoms for about several months that is worsening.  She has also noticed some increase in nasal congestion and drainage. She has already had a course of amoxicillin and medrol dose pack 12/31/22.  This helped with her symptoms but did not completely resolve the symptoms.  She does continue to do her allergen immunotherapy and is taking her antihistamine either Zyrtec or Xyzal.  She states she is currently not using the nasal spray Ryaltris.  Also currently not performing nasal saline rinse.   Review of systems: Review of Systems  Constitutional: Negative.   HENT:         See HPI  Eyes: Negative.   Respiratory: Negative.    Cardiovascular: Negative.   Gastrointestinal: Negative.   Musculoskeletal: Negative.   Skin: Negative.   Allergic/Immunologic: Negative.   Neurological: Negative.      All other systems negative unless noted above in HPI  Past medical/social/surgical/family history have been reviewed and are unchanged unless specifically indicated below.  No changes  Medication List: Current Outpatient Medications  Medication Sig Dispense Refill   APPLE CIDER VINEGAR PO Take by mouth.     Ascorbic Acid (VITAMIN C PO) Take by mouth.     cetirizine (ZYRTEC) 10 MG tablet Take 10 mg by mouth daily as needed for allergies.     cholecalciferol (VITAMIN D) 1000 UNITS tablet Take 1,000 Units by mouth daily.     Cyanocobalamin (VITAMIN B-12 PO) Take by mouth.     doxycycline  (ADOXA) 100 MG tablet Take 1 tablet (100 mg total) by mouth 2 (two) times daily for 14 days. 28 tablet 0   EPINEPHrine (EPIPEN 2-PAK) 0.3 mg/0.3 mL IJ SOAJ injection Inject 0.3 mg into the muscle as needed for anaphylaxis. 0.3 mL 1   Omega-3 Fatty Acids (FISH OIL) 1200 MG CAPS Take 1,200 mg by mouth daily.     TURMERIC PO Take by mouth.     valACYclovir (VALTREX) 1000 MG tablet Take 2 tablets (2,000 mg total) by mouth and repeat in 12 hours or as directed 30 tablet 0   zinc gluconate 50 MG tablet Take 50 mg by mouth daily.     methylPREDNISolone (MEDROL DOSEPAK) 4 MG TBPK tablet Take as directed (Patient not taking: Reported on 01/16/2023) 21 tablet 0   multivitamin-lutein (OCUVITE-LUTEIN) CAPS capsule Take 1 capsule by mouth daily. (Patient not taking: Reported on 01/16/2023)     Olopatadine-Mometasone (RYALTRIS) 665-25 MCG/ACT SUSP Place 2 sprays into the nose 2 (two) times daily as needed (runny or stuffy nose). (Patient not taking: Reported on 01/16/2023) 29 g 5   triamcinolone (KENALOG) 0.1 % paste Apply thin layer to affected areas up to three times a day. Press small dab to lesion until thin film develops, do not rub in (Patient not taking: Reported on 01/16/2023) 5 g 10   No current facility-administered medications for this visit.     Known medication allergies: Allergies  Allergen Reactions   Augmentin [Amoxicillin-Pot Clavulanate] Diarrhea   Ceftin [Cefuroxime Axetil] Other (See Comments)    Severe yeast infection   Nsaids Nausea Only and Other (See Comments)    Abdominal pain / severe reflux/chest pain     Physical examination: Blood pressure 108/68, pulse 71, temperature 98.6 F (37 C), temperature source Temporal, resp. rate 14, last menstrual period 11/28/2014, SpO2 98 %.  General: Alert, interactive, in no acute distress. HEENT: PERRLA, TMs pearly gray, turbinates minimally edematous without discharge, post-pharynx non erythematous. Neck: Supple without  lymphadenopathy. Lungs: Clear to auscultation without wheezing, rhonchi or rales. {no increased work of breathing. CV: Normal S1, S2 without murmurs. Abdomen: Nondistended, nontender. Skin: Warm and dry, without lesions or rashes. Extremities:  No clubbing, cyanosis or edema. Neuro:   Grossly intact.  Diagnositics/Labs: None today  Assessment and plan: Sinusitis - incomplete resolution of sinusitis with Amoxcillin course and steroid taper pack - will extend course with Doxycyclin 100mg  1 tab twice a day for next 14 days - use Ryaltris 2 sprays each nostril twice a day.  Best if used after nasal rinse.  - recommend performing nasal saline rinse.  Use distilled water or boil water and let cool to room temperature - monitor for fever, hearing changes, ringing of ears, vision changes, fever, neck pain as these would be reasons to obtain imaging studies  Allergic rhinitis  - continue appropriate allergen avoidance measures  - continue allergen immunotherapy (allergy shots) per protocol and have access to your epinephrine device.    - recommend nasal saline rinse 3-7 days a week to help keep nose/sinuses clear  - continue daily antihistamine Zyrtec 10mg  or Xyzal 5mg  as needed.   If needed can take additional dose around your injections to help with itching of the back  - use Ryaltris nasal spray that helps with both nasal congestion and drainage control.  You can use Ryaltris 2 sprays each nostril twice a day as needed.   Follow-up 12 months or sooner if needed   I appreciate the opportunity to take part in Blayke's care. Please do not hesitate to contact me with questions.  Sincerely,   Margo Aye, MD Allergy/Immunology [facility]

## 2023-01-16 NOTE — Patient Instructions (Addendum)
Sinusitis - incomplete resolution of sinusitis with Amoxcillin course and steroid taper pack - will extend course with Doxycyclin 100mg  1 tab twice a day for next 14 days - use Ryaltris 2 sprays each nostril twice a day.  Best if used after nasal rinse.  - recommend performing nasal saline rinse.  Use distilled water or boil water and let cool to room temperature - monitor for fever, hearing changes, ringing of ears, vision changes, fever, neck pain as these would be reasons to obtain imaging studies  Allergies   - continue appropriate allergen avoidance measures  - continue allergen immunotherapy (allergy shots) per protocol and have access to your epinephrine device.    - recommend nasal saline rinse 3-7 days a week to help keep nose/sinuses clear  - continue daily antihistamine Zyrtec 10mg  or Xyzal 5mg  as needed.   If needed can take additional dose around your injections to help with itching of the back  - use Ryaltris nasal spray that helps with both nasal congestion and drainage control.  You can use Ryaltris 2 sprays each nostril twice a day as needed.   Follow-up 12 months or sooner if needed

## 2023-02-06 ENCOUNTER — Ambulatory Visit (INDEPENDENT_AMBULATORY_CARE_PROVIDER_SITE_OTHER): Payer: Commercial Managed Care - PPO | Admitting: *Deleted

## 2023-02-06 DIAGNOSIS — J309 Allergic rhinitis, unspecified: Secondary | ICD-10-CM

## 2023-02-18 ENCOUNTER — Ambulatory Visit (INDEPENDENT_AMBULATORY_CARE_PROVIDER_SITE_OTHER): Payer: Commercial Managed Care - PPO

## 2023-02-18 DIAGNOSIS — J309 Allergic rhinitis, unspecified: Secondary | ICD-10-CM

## 2023-03-10 ENCOUNTER — Telehealth (INDEPENDENT_AMBULATORY_CARE_PROVIDER_SITE_OTHER): Payer: Commercial Managed Care - PPO | Admitting: Family Medicine

## 2023-03-10 ENCOUNTER — Other Ambulatory Visit (HOSPITAL_COMMUNITY): Payer: Self-pay

## 2023-03-10 ENCOUNTER — Encounter: Payer: Self-pay | Admitting: Family Medicine

## 2023-03-10 DIAGNOSIS — U071 COVID-19: Secondary | ICD-10-CM | POA: Diagnosis not present

## 2023-03-10 MED ORDER — MOLNUPIRAVIR EUA 200MG CAPSULE
4.0000 | ORAL_CAPSULE | Freq: Two times a day (BID) | ORAL | 0 refills | Status: AC
Start: 2023-03-10 — End: 2023-03-15
  Filled 2023-03-10: qty 40, 5d supply, fill #0

## 2023-03-10 NOTE — Progress Notes (Signed)
Virtual Visit via Video Note  I connected with Valerie [name] on 03/10/23 at  1:00 PM EDT by a video enabled telemedicine application and verified that I am speaking with the correct person using two identifiers.  Location patient: home Location provider:work or home office Persons participating in the virtual visit: patient, provider  I discussed the limitations of evaluation and management by telemedicine and the availability of in person appointments. The patient expressed understanding and agreed to proceed.  Chief Complaint  Patient presents with   Covid Positive    Yesterday evening. Symptoms of chills, cough thought it was the flu. Tested neg on Saturday. Taking tylenol for body ache, mucinex DM for cough    HPI: Pt is a 57 yo female followed by Dr. Fabian Sharp who was seen for acute concern.  Pt was in [place] for a conference last wk.  She developed body aches and chills on Sat.  COVID test was negative at that time.  Pt took another home COVID yesterday which was positive.  No longer having chills today.  Endorses cough, HA, sweating, ST, decreased appetite.  Denies fever, ear pain/pressure, n/v, diarrhea, SOB, wheezing.  Took Tylenol and Mucinex DM.  This is the first time pt has had COVID.   ROS: See pertinent positives and negatives per HPI.  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     Past Surgical History:  Procedure Laterality Date   CYST REMOVAL HAND Right    DILITATION & CURRETTAGE/HYSTROSCOPY WITH VERSAPOINT RESECTION  08/27/2012   Procedure: DILATATION & CURETTAGE/HYSTEROSCOPY WITH VERSAPOINT RESECTION;  Surgeon: Genia Del, MD;  Location: [facility];  Service: Gynecology;;   LASIK  2009   mono vision   TRIGGER  FINGER RELEASE Right 06/02/2017   Procedure: RELEASE TRIGGER FINGER/A-1 PULLEY RIGHT RING TRIGGER RELEASE;  Surgeon: Betha Loa, MD;  Location: [facility];  Service: Orthopedics;  Laterality: Right;    Family History  Problem Relation Age of Onset   Hypertension Mother    Stroke Mother    Allergic rhinitis Mother    Diabetes Father    Hyperlipidemia Father    Heart disease Father    Stroke Father    Colon polyps Brother        elev TG   Allergic rhinitis Brother    Breast cancer Maternal Aunt        diagnosed in her 23's   Liver disease Maternal Uncle        alcohol related   Stroke Maternal Grandmother    Diabetes Maternal Grandmother    Heart disease Maternal Grandmother    Diabetes Maternal Grandfather    Alcohol abuse Other      Current Outpatient Medications:    APPLE CIDER VINEGAR PO, Take by mouth., Disp: , Rfl:    Ascorbic Acid (VITAMIN C PO), Take by mouth., Disp: , Rfl:    cetirizine (ZYRTEC) 10 MG tablet, Take 10 mg by mouth daily as needed for allergies., Disp: , Rfl:    cholecalciferol (VITAMIN D) 1000 UNITS tablet, Take 1,000 Units by mouth daily., Disp: , Rfl:    Cyanocobalamin (VITAMIN B-12 PO), Take by mouth., Disp: ,  Rfl:    EPINEPHrine (EPIPEN 2-PAK) 0.3 mg/0.3 mL IJ SOAJ injection, Inject 0.3 mg into the muscle as needed for anaphylaxis., Disp: 0.3 mL, Rfl: 1   Omega-3 Fatty Acids (FISH OIL) 1200 MG CAPS, Take 1,200 mg by mouth daily., Disp: , Rfl:    TURMERIC PO, Take by mouth., Disp: , Rfl:    valACYclovir (VALTREX) 1000 MG tablet, Take 2 tablets (2,000 mg total) by mouth and repeat in 12 hours or as directed, Disp: 30 tablet, Rfl: 0   zinc gluconate 50 MG tablet, Take 50 mg by mouth daily., Disp: , Rfl:    multivitamin-lutein (OCUVITE-LUTEIN) CAPS capsule, Take 1 capsule by mouth daily. (Patient not taking: Reported on 01/16/2023), Disp: , Rfl:    Olopatadine-Mometasone (RYALTRIS) 665-25 MCG/ACT SUSP, Place 2 sprays into the nose 2 (two)  times daily as needed (runny or stuffy nose). (Patient not taking: Reported on 01/16/2023), Disp: 29 g, Rfl: 5   triamcinolone (KENALOG) 0.1 % paste, Apply thin layer to affected areas up to three times a day. Press small dab to lesion until thin film develops, do not rub in (Patient not taking: Reported on 01/16/2023), Disp: 5 g, Rfl: 10  EXAM:  VITALS per patient if applicable:  RR between 12-20 bpm  GENERAL: alert, oriented, appears well and in no acute distress  HEENT: atraumatic, conjunctiva clear, no obvious abnormalities on inspection of external nose and ears  NECK: normal movements of the head and neck  LUNGS: on inspection no signs of respiratory distress, breathing rate appears normal, no obvious gross SOB, gasping or wheezing  CV: no obvious cyanosis  MS: moves all visible extremities without noticeable abnormality  PSYCH/NEURO: pleasant and cooperative, no obvious depression or anxiety, speech and thought processing grossly intact  ASSESSMENT AND PLAN:  Discussed the following assessment and plan:  COVID-19 virus infection - Plan: molnupiravir EUA (LAGEVRIO) 200 mg CAPS capsule Symptoms starting 03/07/23  with positive home COVID test 03/09/23.  Discussed r/b/a of antiviral medications.  Pt wishes to start.  Rx for Molnupiravir sen to pharmacy.  Continue supportive care.  Given strict precautions.  F/u w/ pcp prn   I discussed the assessment and treatment plan with the patient. The patient was provided an opportunity to ask questions and all were answered. The patient agreed with the plan and demonstrated an understanding of the instructions.   The patient was advised to call back or seek an in-person evaluation if the symptoms worsen or if the condition fails to improve as anticipated.   Deeann Saint, MD

## 2023-03-19 ENCOUNTER — Encounter (INDEPENDENT_AMBULATORY_CARE_PROVIDER_SITE_OTHER): Payer: Self-pay

## 2023-03-20 ENCOUNTER — Ambulatory Visit (INDEPENDENT_AMBULATORY_CARE_PROVIDER_SITE_OTHER): Payer: Commercial Managed Care - PPO

## 2023-03-20 DIAGNOSIS — J309 Allergic rhinitis, unspecified: Secondary | ICD-10-CM

## 2023-04-03 ENCOUNTER — Ambulatory Visit (INDEPENDENT_AMBULATORY_CARE_PROVIDER_SITE_OTHER): Payer: Commercial Managed Care - PPO | Admitting: *Deleted

## 2023-04-03 DIAGNOSIS — J309 Allergic rhinitis, unspecified: Secondary | ICD-10-CM | POA: Diagnosis not present

## 2023-04-07 NOTE — Progress Notes (Unsigned)
No chief complaint on file.   HPI: Patient  Valerie [name]  57 y.o. comes in today for Preventive Health Care visit    Allergic rhinitis  GYNE  Health Maintenance  Topic Date Due   HIV Screening  Never done   Hepatitis C Screening  Never done   Zoster Vaccines- Shingrix (1 of 2) Never done   COVID-19 Vaccine (1) 01/08/2021   INFLUENZA VACCINE  03/20/2023   MAMMOGRAM  05/14/2024   PAP SMEAR-Modifier  06/14/2024   DTaP/Tdap/Td (3 - Td or Tdap) 12/04/2024   Colonoscopy  11/15/2025   HPV VACCINES  Aged Out   Health Maintenance Review LIFESTYLE:  Exercise:   Tobacco/ETS: Alcohol:  Sugar beverages: Sleep: Drug use: no HH of  Work:    ROS:  GEN/ HEENT: No fever, significant weight changes sweats headaches vision problems hearing changes, CV/ PULM; No chest pain shortness of breath cough, syncope,edema  change in exercise tolerance. GI /GU: No adominal pain, vomiting, change in bowel habits. No blood in the stool. No significant GU symptoms. SKIN/HEME: ,no acute skin rashes suspicious lesions or bleeding. No lymphadenopathy, nodules, masses.  NEURO/ PSYCH:  No neurologic signs such as weakness numbness. No depression anxiety. IMM/ Allergy: No unusual infections.  Allergy .   REST of 12 system review negative except as per HPI   Past Medical History:  Diagnosis Date   Allergy    cats   Anemia    NOS   Female fertility problem    hx of treatment   GERD (gastroesophageal reflux disease)    HSV infection    labialis   RECTAL BLEEDING 07/10/2007   Qualifier: Diagnosis of  By: Fabian Sharp MD, Neta Mends 2008 colonoscopy [facility].     Past Surgical History:  Procedure Laterality Date   CYST REMOVAL HAND Right    DILITATION & CURRETTAGE/HYSTROSCOPY WITH VERSAPOINT RESECTION  08/27/2012   Procedure: DILATATION & CURETTAGE/HYSTEROSCOPY WITH VERSAPOINT RESECTION;  Surgeon: Genia Del, MD;  Location: [facility];  Service: Gynecology;;   LASIK  2009   mono vision   TRIGGER  FINGER RELEASE Right 06/02/2017   Procedure: RELEASE TRIGGER FINGER/A-1 PULLEY RIGHT RING TRIGGER RELEASE;  Surgeon: Betha Loa, MD;  Location: [facility];  Service: Orthopedics;  Laterality: Right;    Family History  Problem Relation Age of Onset   Hypertension Mother    Stroke Mother    Allergic rhinitis Mother    Diabetes Father    Hyperlipidemia Father    Heart disease Father    Stroke Father    Colon polyps Brother        elev TG   Allergic rhinitis Brother    Breast cancer Maternal Aunt        diagnosed in her 36's   Liver disease Maternal Uncle        alcohol related   Stroke Maternal Grandmother    Diabetes Maternal Grandmother    Heart disease Maternal Grandmother    Diabetes Maternal Grandfather    Alcohol abuse Other     Social History   Socioeconomic History   Marital status: Married    Spouse name: Not on file   Number of children: Not on file   Years of education: Not on file   Highest education level: Not on file  Occupational History   Occupation: PA    Employer: [employer]    Comment: Rehab  Tobacco Use   Smoking status: Never    Passive exposure: Never  Smokeless tobacco: Never  Vaping Use   Vaping status: Never Used  Substance and Sexual Activity   Alcohol use: Yes    Comment: once every   Drug use: No   Sexual activity: Yes    Partners: Male    Birth control/protection: Post-menopausal    Comment: 1st intercourse- 29, partners- 1  Other Topics Concern   Not on file  Social History Narrative   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     Ascorbic Acid (VITAMIN C PO) Take by mouth.     cetirizine (ZYRTEC) 10 MG tablet Take 10 mg by mouth daily as needed for allergies.     cholecalciferol (VITAMIN D) 1000 UNITS tablet Take 1,000 Units by mouth daily.     Cyanocobalamin (VITAMIN B-12 PO) Take by mouth.     EPINEPHrine (EPIPEN 2-PAK) 0.3 mg/0.3 mL IJ SOAJ injection Inject 0.3 mg into the muscle as needed for anaphylaxis. 0.3 mL 1   multivitamin-lutein (OCUVITE-LUTEIN) CAPS capsule Take 1 capsule by mouth daily. (Patient not taking: Reported on 01/16/2023)     Olopatadine-Mometasone (RYALTRIS) 665-25 MCG/ACT SUSP Place 2 sprays into the nose 2 (two) times daily as needed (runny or stuffy nose). (Patient not taking: Reported on 01/16/2023) 29 g 5   Omega-3 Fatty Acids (FISH OIL) 1200 MG CAPS Take 1,200 mg by mouth daily.     triamcinolone (KENALOG) 0.1 % paste Apply thin layer to affected areas up to three times a day. Press small dab to lesion until thin film develops, do not rub in (Patient not taking: Reported on 01/16/2023) 5 g 10   TURMERIC PO Take by mouth.     valACYclovir (VALTREX) 1000 MG tablet Take 2 tablets (2,000 mg total) by mouth and repeat in 12 hours or as directed 30 tablet 0   zinc gluconate 50 MG tablet Take 50 mg by mouth daily.     No facility-administered medications prior to visit.     EXAM:  LMP 11/28/2014 Comment: SA  There is no height or weight on file to calculate BMI. Wt Readings from Last 3 Encounters:  12/31/22 132 lb 11.2 oz (60.2 kg)  10/24/22 132 lb 3.2 oz (60 kg)  09/03/22 130 lb (59 kg)    Physical Exam: Vital signs reviewed WUJ:WJXB is a well-developed well-nourished alert cooperative    who appearsr stated age in no acute distress.  HEENT: normocephalic atraumatic , Eyes: PERRL EOM's full, conjunctiva clear, Nares: paten,t no deformity discharge or tenderness., Ears: no deformity EAC's clear TMs with normal  landmarks. Mouth: clear OP, no lesions, edema.  Moist mucous membranes. Dentition in adequate repair. NECK: supple without masses, thyromegaly or bruits. CHEST/PULM:  Clear to auscultation and percussion breath sounds equal no wheeze , rales or rhonchi. No chest wall deformities or tenderness. Breast: normal by inspection . No dimpling, discharge, masses, tenderness or discharge . CV: PMI is nondisplaced, S1 S2 no gallops, murmurs, rubs.  Peripheral pulses are full without delay.No JVD .  ABDOMEN: Bowel sounds normal nontender  No guard or rebound, no hepato splenomegal no CVA tenderness.  Extremtities:  No clubbing cyanosis or edema, no acute joint swelling or redness no focal atrophy NEURO:  Oriented x3, cranial nerves 3-12 appear to be intact, no obvious focal weakness,gait within normal limits no abnormal reflexes or asymmetrical SKIN: No acute rashes normal turgor, color, no bruising or petechiae. PSYCH: Oriented, good eye contact, no obvious depression anxiety, cognition and judgment appear normal. LN: no cervical axillary adenopathy  Lab Results  Component Value Date   WBC 4.5 01/08/2022   HGB 12.7 01/08/2022   HCT 37.5 01/08/2022   PLT 198.0 01/08/2022   GLUCOSE 89 01/08/2022   CHOL 194 01/08/2022   TRIG 46.0 01/08/2022   HDL 75.10 01/08/2022   LDLCALC 109 (H) 01/08/2022   ALT 19 01/08/2022   AST 20 01/08/2022   NA 135 01/08/2022   K 4.1 01/08/2022   CL 102 01/08/2022   CREATININE 0.63 01/08/2022   BUN 9 01/08/2022   CO2 30 01/08/2022   TSH 1.14 01/08/2022   HGBA1C 5.7 01/08/2022    BP Readings from Last 3 Encounters:  01/16/23 108/68  12/31/22 120/66  10/24/22 116/70    Lab plan reviewed with patient   ASSESSMENT AND PLAN:  Discussed the following assessment and plan:    ICD-10-CM   1. Visit for preventive health examination  Z00.00     2. Medication management  Z79.899     3. Family history of stroke  Z82.3      No follow-ups on file.  Patient Care  Team: Devantae Babe, Neta Mends, MD as PCP - General Sidney Ace, MD (Allergy) Genia Del, MD as Attending Physician (Obstetrics and Gynecology) Judi Saa, DO as Attending Physician (Sports Medicine) There are no Patient Instructions on file for this visit.  Neta Mends. Berline Semrad M.D.

## 2023-04-08 ENCOUNTER — Ambulatory Visit (INDEPENDENT_AMBULATORY_CARE_PROVIDER_SITE_OTHER): Payer: Commercial Managed Care - PPO | Admitting: *Deleted

## 2023-04-08 ENCOUNTER — Encounter: Payer: Self-pay | Admitting: Internal Medicine

## 2023-04-08 ENCOUNTER — Ambulatory Visit: Payer: Commercial Managed Care - PPO | Admitting: Internal Medicine

## 2023-04-08 VITALS — BP 106/70 | HR 73 | Temp 98.1°F | Ht 63.0 in | Wt 131.6 lb

## 2023-04-08 DIAGNOSIS — Z823 Family history of stroke: Secondary | ICD-10-CM

## 2023-04-08 DIAGNOSIS — Z13220 Encounter for screening for lipoid disorders: Secondary | ICD-10-CM | POA: Diagnosis not present

## 2023-04-08 DIAGNOSIS — D72819 Decreased white blood cell count, unspecified: Secondary | ICD-10-CM

## 2023-04-08 DIAGNOSIS — Z114 Encounter for screening for human immunodeficiency virus [HIV]: Secondary | ICD-10-CM | POA: Diagnosis not present

## 2023-04-08 DIAGNOSIS — Z79899 Other long term (current) drug therapy: Secondary | ICD-10-CM | POA: Diagnosis not present

## 2023-04-08 DIAGNOSIS — Z0000 Encounter for general adult medical examination without abnormal findings: Secondary | ICD-10-CM

## 2023-04-08 DIAGNOSIS — J309 Allergic rhinitis, unspecified: Secondary | ICD-10-CM

## 2023-04-08 DIAGNOSIS — Z1159 Encounter for screening for other viral diseases: Secondary | ICD-10-CM

## 2023-04-08 LAB — TSH: TSH: 1.2 u[IU]/mL (ref 0.35–5.50)

## 2023-04-08 LAB — LIPID PANEL
Cholesterol: 203 mg/dL — ABNORMAL HIGH (ref 0–200)
HDL: 70.7 mg/dL (ref >39.00)
LDL Cholesterol: 119 mg/dL — ABNORMAL HIGH (ref 0–99)
NonHDL: 132.62
Total CHOL/HDL Ratio: 3
Triglycerides: 69 mg/dL (ref 0.0–149.0)
VLDL: 13.8 mg/dL (ref 0.0–40.0)

## 2023-04-08 LAB — COMPREHENSIVE METABOLIC PANEL
ALT: 16 U/L (ref 0–35)
AST: 17 U/L (ref 0–37)
Albumin: 4 g/dL (ref 3.5–5.2)
Alkaline Phosphatase: 68 U/L (ref 39–117)
BUN: 11 mg/dL (ref 6–23)
CO2: 27 mEq/L (ref 19–32)
Calcium: 9 mg/dL (ref 8.4–10.5)
Chloride: 104 mEq/L (ref 96–112)
Creatinine, Ser: 0.67 mg/dL (ref 0.40–1.20)
GFR: 96.52 mL/min (ref >60.00)
Glucose, Bld: 101 mg/dL — ABNORMAL HIGH (ref 70–99)
Potassium: 4.1 mEq/L (ref 3.5–5.1)
Sodium: 138 mEq/L (ref 135–145)
Total Bilirubin: 0.4 mg/dL (ref 0.2–1.2)
Total Protein: 6.8 g/dL (ref 6.0–8.3)

## 2023-04-08 LAB — CBC WITH DIFFERENTIAL/PLATELET
Basophils Absolute: 0 10*3/uL (ref 0.0–0.1)
Basophils Relative: 0.4 % (ref 0.0–3.0)
Eosinophils Absolute: 0.1 10*3/uL (ref 0.0–0.7)
Eosinophils Relative: 2 % (ref 0.0–5.0)
HCT: 38.6 % (ref 36.0–46.0)
Hemoglobin: 12.6 g/dL (ref 12.0–15.0)
Lymphocytes Relative: 45.4 % (ref 12.0–46.0)
Lymphs Abs: 1.4 10*3/uL (ref 0.7–4.0)
MCHC: 32.6 g/dL (ref 30.0–36.0)
MCV: 89 fl (ref 78.0–100.0)
Monocytes Absolute: 0.2 10*3/uL (ref 0.1–1.0)
Monocytes Relative: 7.1 % (ref 3.0–12.0)
Neutro Abs: 1.4 10*3/uL (ref 1.4–7.7)
Neutrophils Relative %: 45.1 % (ref 43.0–77.0)
Platelets: 200 10*3/uL (ref 150.0–400.0)
RBC: 4.34 Mil/uL (ref 3.87–5.11)
RDW: 12.9 % (ref 11.5–15.5)
WBC: 3.2 10*3/uL — ABNORMAL LOW (ref 4.0–10.5)

## 2023-04-08 LAB — HEMOGLOBIN A1C: Hgb A1c MFr Bld: 5.7 % (ref 4.6–6.5)

## 2023-04-08 NOTE — Patient Instructions (Signed)
Good to see you today . Lab update.  Continue lifestyle intervention healthy eating and exercise . As possible

## 2023-04-08 NOTE — Progress Notes (Signed)
Results stable, ldl 113 A1c 5.7  Gfr hepatic normal range

## 2023-04-09 ENCOUNTER — Other Ambulatory Visit: Payer: Self-pay | Admitting: Internal Medicine

## 2023-04-09 DIAGNOSIS — Z01419 Encounter for gynecological examination (general) (routine) without abnormal findings: Secondary | ICD-10-CM

## 2023-04-09 DIAGNOSIS — Z780 Asymptomatic menopausal state: Secondary | ICD-10-CM

## 2023-04-09 LAB — HIV ANTIBODY (ROUTINE TESTING W REFLEX): HIV 1&2 Ab, 4th Generation: NONREACTIVE

## 2023-04-09 LAB — HEPATITIS C ANTIBODY: Hepatitis C Ab: NONREACTIVE

## 2023-04-14 ENCOUNTER — Ambulatory Visit
Admission: RE | Admit: 2023-04-14 | Discharge: 2023-04-14 | Disposition: A | Discharge status: Home / Self Care | Payer: Commercial Managed Care - PPO | Source: Ambulatory Visit | Attending: Obstetrics & Gynecology | Admitting: Obstetrics & Gynecology

## 2023-04-14 DIAGNOSIS — Z780 Asymptomatic menopausal state: Secondary | ICD-10-CM

## 2023-04-14 DIAGNOSIS — E349 Endocrine disorder, unspecified: Secondary | ICD-10-CM | POA: Diagnosis not present

## 2023-04-14 DIAGNOSIS — N958 Other specified menopausal and perimenopausal disorders: Secondary | ICD-10-CM | POA: Diagnosis not present

## 2023-04-14 NOTE — Progress Notes (Signed)
Normal range Dexa

## 2023-04-22 ENCOUNTER — Ambulatory Visit (INDEPENDENT_AMBULATORY_CARE_PROVIDER_SITE_OTHER): Payer: Commercial Managed Care - PPO | Admitting: *Deleted

## 2023-04-22 DIAGNOSIS — J309 Allergic rhinitis, unspecified: Secondary | ICD-10-CM | POA: Diagnosis not present

## 2023-05-01 ENCOUNTER — Ambulatory Visit (INDEPENDENT_AMBULATORY_CARE_PROVIDER_SITE_OTHER): Payer: Commercial Managed Care - PPO

## 2023-05-01 DIAGNOSIS — J309 Allergic rhinitis, unspecified: Secondary | ICD-10-CM | POA: Diagnosis not present

## 2023-05-13 ENCOUNTER — Ambulatory Visit (INDEPENDENT_AMBULATORY_CARE_PROVIDER_SITE_OTHER): Payer: Self-pay

## 2023-05-13 DIAGNOSIS — J309 Allergic rhinitis, unspecified: Secondary | ICD-10-CM

## 2023-05-22 ENCOUNTER — Ambulatory Visit (INDEPENDENT_AMBULATORY_CARE_PROVIDER_SITE_OTHER): Payer: Commercial Managed Care - PPO

## 2023-05-22 DIAGNOSIS — J309 Allergic rhinitis, unspecified: Secondary | ICD-10-CM

## 2023-05-27 ENCOUNTER — Ambulatory Visit (INDEPENDENT_AMBULATORY_CARE_PROVIDER_SITE_OTHER): Payer: Commercial Managed Care - PPO | Admitting: *Deleted

## 2023-05-27 DIAGNOSIS — J309 Allergic rhinitis, unspecified: Secondary | ICD-10-CM | POA: Diagnosis not present

## 2023-05-29 ENCOUNTER — Other Ambulatory Visit (HOSPITAL_COMMUNITY): Payer: Self-pay

## 2023-05-29 MED ORDER — TRIAMCINOLONE ACETONIDE 0.1 % MT PSTE
1.0000 | PASTE | Freq: Three times a day (TID) | OROMUCOSAL | 99 refills | Status: AC
  Filled 2023-05-29: qty 5, 2d supply, fill #0

## 2023-06-03 ENCOUNTER — Ambulatory Visit (INDEPENDENT_AMBULATORY_CARE_PROVIDER_SITE_OTHER): Payer: Commercial Managed Care - PPO

## 2023-06-03 DIAGNOSIS — J309 Allergic rhinitis, unspecified: Secondary | ICD-10-CM | POA: Diagnosis not present

## 2023-06-10 ENCOUNTER — Other Ambulatory Visit (HOSPITAL_COMMUNITY): Payer: Self-pay

## 2023-06-16 ENCOUNTER — Telehealth: Payer: Self-pay | Admitting: Internal Medicine

## 2023-06-16 NOTE — Telephone Encounter (Signed)
Says she was bitten by cat and wonders if she needs an antibiotic

## 2023-06-17 ENCOUNTER — Ambulatory Visit (INDEPENDENT_AMBULATORY_CARE_PROVIDER_SITE_OTHER): Payer: Commercial Managed Care - PPO | Admitting: *Deleted

## 2023-06-17 DIAGNOSIS — J309 Allergic rhinitis, unspecified: Secondary | ICD-10-CM | POA: Diagnosis not present

## 2023-06-18 ENCOUNTER — Ambulatory Visit: Payer: Commercial Managed Care - PPO | Admitting: Family Medicine

## 2023-06-18 ENCOUNTER — Encounter: Payer: Self-pay | Admitting: Family Medicine

## 2023-06-18 VITALS — BP 110/70 | HR 72 | Temp 97.6°F | Ht 63.0 in | Wt 136.5 lb

## 2023-06-18 DIAGNOSIS — W5501XA Bitten by cat, initial encounter: Secondary | ICD-10-CM

## 2023-06-18 DIAGNOSIS — S80872A Other superficial bite, left lower leg, initial encounter: Secondary | ICD-10-CM

## 2023-06-18 NOTE — Progress Notes (Signed)
Established Patient Office Visit  Subjective   Patient ID: Valerie [name], female    DOB: February 26, 1966  Age: 57 y.o. MRN: [id number]  Chief Complaint  Patient presents with   Animal Bite    Patient complains of cat bite, x2 days     HPI   Valerie [name] is evaluated for cat bite.  This up for Monday.  This is her cat.  Cat is an Art therapist.  Patient had pajamas on at the time and the cat bit into her leg and she pulled away.  She has somewhat of a longitudinal superficial mark on her leg which is left lateral leg.  No fevers or chills.  No erythema.  No significant tenderness or swelling.  No warmth.  Patient's last tetanus 2016.  She thinks the cat has been fully vaccinated but again this is an indoor cat.  Cat has not been acting sickly in any way.  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    Past Surgical History:  Procedure Laterality Date   CYST REMOVAL HAND Right    DILITATION & CURRETTAGE/HYSTROSCOPY WITH VERSAPOINT RESECTION  08/27/2012   Procedure: DILATATION & CURETTAGE/HYSTEROSCOPY WITH VERSAPOINT RESECTION;  Surgeon: Genia Del, MD;  Location: [facility];  Service: Gynecology;;   LASIK  2009   mono vision   TRIGGER FINGER RELEASE Right 06/02/2017   Procedure: RELEASE TRIGGER FINGER/A-1 PULLEY RIGHT RING TRIGGER RELEASE;  Surgeon: Betha Loa, MD;  Location: [facility];  Service: Orthopedics;  Laterality: Right;    reports that she has never smoked. She has never been exposed to tobacco smoke. She has never used smokeless tobacco. She reports current alcohol use. She reports that she does not use drugs. family history includes Alcohol abuse in an other family member; Allergic rhinitis in her brother and mother;  Breast cancer in her maternal aunt; Colon polyps in her brother; Diabetes in her father, maternal grandfather, and maternal grandmother; Heart disease in her father and maternal grandmother; Hyperlipidemia in her father; Hypertension in her mother; Liver disease in her maternal uncle; Stroke in her father, maternal grandmother, and mother. Allergies  Allergen Reactions   Augmentin [Amoxicillin-Pot Clavulanate] Diarrhea   Ceftin [Cefuroxime Axetil] Other (See Comments)    Severe yeast infection   Nsaids Nausea Only and Other (See Comments)    Abdominal pain / severe reflux/chest pain    Review of Systems  Constitutional:  Negative for chills and fever.      Objective:     BP 110/70 (BP Location: Left Arm, Patient Position: Sitting, Cuff Size: Normal)   Pulse 72   Temp 97.6 F (36.4 C) (Oral)   Ht 5\' 3"  (1.6 m)   Wt 136 lb 8 oz (61.9 kg)   LMP 11/28/2014 Comment: SA  SpO2 100%   BMI 24.18 kg/m  BP Readings from Last 3 Encounters:  06/18/23 110/70  04/08/23 106/70  01/16/23 108/68   Wt Readings from Last 3 Encounters:  06/18/23 136 lb 8 oz (61.9 kg)  04/08/23 131 lb 9.6 oz (59.7 kg)  12/31/22 132 lb 11.2 oz (60.2 kg)      Physical Exam Vitals reviewed.  Constitutional:      General: She is not in acute distress.    Appearance: Normal appearance. She is not ill-appearing.  Cardiovascular:     Rate and Rhythm: Normal rate.  Skin:    Comments: Left lateral leg reveals about 3 cm superficial longitudinal wound.  There is no swelling.  No warmth.  No erythema.  Nontender.  No cellulitis changes.  No fluctuance.  Neurological:     Mental Status: She is alert.      No results found for any visits on 06/18/23.    The 10-year ASCVD risk score (Arnett DK, et al., 2019) is: 1.7%    Assessment & Plan:   Problem List Items Addressed This Visit   None Visit Diagnoses     Cat bite, initial encounter    -  Primary     Superficial bite left lateral leg.  This is  about 48 hours since bite and fortunately no signs of cellulitis at this time.  Her tetanus is up-to-date. Cat is an indoor cat so essentially no risk for rabies  -Follow-up immediately for any increased redness, swelling, warmth, or pain  No follow-ups on file.    Evelena Peat, MD

## 2023-06-18 NOTE — Patient Instructions (Signed)
Follow for any signs of infection such as redness, swelling, or warmth.

## 2023-07-08 ENCOUNTER — Ambulatory Visit (INDEPENDENT_AMBULATORY_CARE_PROVIDER_SITE_OTHER): Payer: Self-pay

## 2023-07-08 DIAGNOSIS — J309 Allergic rhinitis, unspecified: Secondary | ICD-10-CM

## 2023-07-30 ENCOUNTER — Ambulatory Visit (INDEPENDENT_AMBULATORY_CARE_PROVIDER_SITE_OTHER): Payer: Self-pay

## 2023-07-30 DIAGNOSIS — J309 Allergic rhinitis, unspecified: Secondary | ICD-10-CM

## 2023-08-04 DIAGNOSIS — J3081 Allergic rhinitis due to animal (cat) (dog) hair and dander: Secondary | ICD-10-CM | POA: Diagnosis not present

## 2023-08-05 NOTE — Progress Notes (Signed)
VIALS EXP 08-04-24

## 2023-08-07 ENCOUNTER — Ambulatory Visit (INDEPENDENT_AMBULATORY_CARE_PROVIDER_SITE_OTHER): Payer: Commercial Managed Care - PPO

## 2023-08-07 DIAGNOSIS — J309 Allergic rhinitis, unspecified: Secondary | ICD-10-CM

## 2023-08-18 ENCOUNTER — Ambulatory Visit (INDEPENDENT_AMBULATORY_CARE_PROVIDER_SITE_OTHER): Payer: Self-pay | Admitting: *Deleted

## 2023-08-18 ENCOUNTER — Other Ambulatory Visit (HOSPITAL_COMMUNITY): Payer: Self-pay

## 2023-08-18 DIAGNOSIS — J309 Allergic rhinitis, unspecified: Secondary | ICD-10-CM | POA: Diagnosis not present

## 2023-08-18 MED ORDER — EPINEPHRINE 0.3 MG/0.3ML IJ SOAJ
0.3000 mg | INTRAMUSCULAR | 1 refills | Status: AC | PRN
  Filled 2023-08-18: qty 2, 7d supply, fill #0

## 2023-08-28 ENCOUNTER — Ambulatory Visit (INDEPENDENT_AMBULATORY_CARE_PROVIDER_SITE_OTHER): Payer: Commercial Managed Care - PPO

## 2023-08-28 DIAGNOSIS — J309 Allergic rhinitis, unspecified: Secondary | ICD-10-CM | POA: Diagnosis not present

## 2023-09-11 ENCOUNTER — Ambulatory Visit (INDEPENDENT_AMBULATORY_CARE_PROVIDER_SITE_OTHER): Payer: Commercial Managed Care - PPO

## 2023-09-11 DIAGNOSIS — J309 Allergic rhinitis, unspecified: Secondary | ICD-10-CM

## 2023-09-16 ENCOUNTER — Ambulatory Visit (INDEPENDENT_AMBULATORY_CARE_PROVIDER_SITE_OTHER): Payer: Self-pay | Admitting: *Deleted

## 2023-09-16 DIAGNOSIS — J309 Allergic rhinitis, unspecified: Secondary | ICD-10-CM | POA: Diagnosis not present

## 2023-09-22 ENCOUNTER — Other Ambulatory Visit (HOSPITAL_BASED_OUTPATIENT_CLINIC_OR_DEPARTMENT_OTHER): Payer: Self-pay

## 2023-09-22 MED ORDER — COVID-19 MRNA VAC-TRIS(PFIZER) 30 MCG/0.3ML IM SUSY
0.3000 mL | PREFILLED_SYRINGE | Freq: Once | INTRAMUSCULAR | 0 refills | Status: AC
  Filled 2023-09-22: qty 0.3, 1d supply, fill #0

## 2023-09-23 ENCOUNTER — Other Ambulatory Visit: Payer: Self-pay | Admitting: Internal Medicine

## 2023-09-23 DIAGNOSIS — Z1231 Encounter for screening mammogram for malignant neoplasm of breast: Secondary | ICD-10-CM

## 2023-09-24 ENCOUNTER — Other Ambulatory Visit: Payer: Self-pay

## 2023-09-24 ENCOUNTER — Encounter: Payer: Self-pay | Admitting: Allergy

## 2023-09-24 ENCOUNTER — Ambulatory Visit: Payer: Commercial Managed Care - PPO | Admitting: Allergy

## 2023-09-24 ENCOUNTER — Ambulatory Visit
Admission: RE | Admit: 2023-09-24 | Discharge: 2023-09-24 | Discharge status: Home / Self Care | Payer: Commercial Managed Care - PPO | Source: Ambulatory Visit | Attending: Internal Medicine | Admitting: Internal Medicine

## 2023-09-24 ENCOUNTER — Other Ambulatory Visit (HOSPITAL_COMMUNITY): Payer: Self-pay

## 2023-09-24 VITALS — BP 110/70 | HR 87 | Temp 98.1°F | Resp 16 | Wt 141.0 lb

## 2023-09-24 DIAGNOSIS — J328 Other chronic sinusitis: Secondary | ICD-10-CM

## 2023-09-24 DIAGNOSIS — J3089 Other allergic rhinitis: Secondary | ICD-10-CM

## 2023-09-24 DIAGNOSIS — Z1231 Encounter for screening mammogram for malignant neoplasm of breast: Secondary | ICD-10-CM | POA: Diagnosis not present

## 2023-09-24 MED ORDER — DOXYCYCLINE HYCLATE 100 MG PO TABS
100.0000 mg | ORAL_TABLET | Freq: Two times a day (BID) | ORAL | 0 refills | Status: DC
  Filled 2023-09-24: qty 20, 10d supply, fill #0

## 2023-09-24 MED ORDER — RYALTRIS 665-25 MCG/ACT NA SUSP: 2.0000 | Freq: Two times a day (BID) | NASAL | 5 refills | Status: DC | PRN

## 2023-09-24 NOTE — Patient Instructions (Addendum)
 Sinus infection - take course of Doxycyclin 100mg  1 tab twice a day for next 10 days - use Ryaltris  2 sprays each nostril twice a day.  Best if used after nasal rinse.  - recommend performing nasal saline rinse.  Use distilled water or boil water and let cool to room temperature - monitor for fever - get adequate rest and stay well hydrated  Allergies   - continue appropriate allergen avoidance measures  - continue allergen immunotherapy (allergy shots) per protocol and have access to your epinephrine  device.    - recommend nasal saline rinse 3-7 days a week to help keep nose/sinuses clear  - continue daily antihistamine Zyrtec 10mg  or Xyzal 5mg  as needed.   If needed can take additional dose around your injections to help with itching of the back  - use Ryaltris  nasal spray that helps with both nasal congestion and drainage control.  You can use Ryaltris  2 sprays each nostril twice a day as needed.   Follow-up 6-12 months or sooner if needed

## 2023-09-24 NOTE — Progress Notes (Signed)
 Follow-up Note  RE: Valerie [name] MRN: [id number] DOB: 1966-03-09 Date of Office Visit: 09/24/2023   History of present illness: Valerie [name] is a 58 y.o. female presenting today for sick visit.  She has history of allergic rhinitis on immunotherapy.  She was last seen in the office on 01/16/2023 by myself. Discussed the use of AI scribe software for clinical note transcription with the patient, who gave verbal consent to proceed.  She has been experiencing sinus congestion and facial pain for approximately a week and a half. The pain is throbbing and located frontally and at the back of her head. She feels 'clogged' without any nasal discharge and experiences tenderness in her cheeks and teeth, particularly on the right side. No fever, hot sweats, chills, cough, or shortness of breath. She is aware of people around her being sick but has not experienced any flu-like symptoms herself.  She has been managing her symptoms with Tylenol  and extra Zyrtec. She has not used any nasal sprays recently but mentions past use of Ryaltris .   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 She is unsure if there is connection between her lichen planus and her sinus issues.      Review of systems: 10pt ROS negative unless noted in HPI  All other systems negative unless noted above in HPI  Past medical/social/surgical/family history have been reviewed and are unchanged unless specifically indicated below.  No changes  Medication List: Current Outpatient Medications  Medication Sig Dispense Refill   APPLE CIDER VINEGAR PO Take by mouth.     Ascorbic Acid (VITAMIN C PO) Take by mouth.     cetirizine (ZYRTEC) 10 MG tablet Take 10 mg by mouth daily as needed for allergies.     cholecalciferol (VITAMIN D) 1000 UNITS tablet Take 1,000 Units by mouth daily.     Cyanocobalamin (VITAMIN B-12 PO) Take by mouth.     doxycycline  (VIBRA -TABS) 100 MG  tablet Take 1 tablet (100 mg total) by mouth 2 (two) times daily. 20 tablet 0   EPINEPHrine  (EPIPEN  2-PAK) 0.3 mg/0.3 mL IJ SOAJ injection Inject 0.3 mg into the muscle as needed for anaphylaxis. 0.3 mL 1   EPINEPHrine  (EPIPEN  2-PAK) 0.3 mg/0.3 mL IJ SOAJ injection Inject 0.3 mg into the muscle as needed for anaphylaxis. 2 each 1   multivitamin-lutein (OCUVITE-LUTEIN) CAPS capsule Take 1 capsule by mouth daily.     Omega-3 Fatty Acids (FISH OIL) 1200 MG CAPS Take 1,200 mg by mouth daily.     TURMERIC PO Take by mouth.     valACYclovir  (VALTREX ) 1000 MG tablet Take 2 tablets (2,000 mg total) by mouth and repeat in 12 hours or as directed (Patient taking differently: Take 2,000 mg by mouth as needed.) 30 tablet 0   zinc gluconate 50 MG tablet Take 50 mg by mouth daily.     Olopatadine-Mometasone (RYALTRIS ) 665-25 MCG/ACT SUSP Place 2 sprays into the nose 2 (two) times daily as needed (runny or stuffy nose). 29 g 5   triamcinolone  (KENALOG ) 0.1 % paste Press a small dab (thin layer) to lesion until thin film develops, don't rub in up to 3 times a day. (Patient not taking: Reported on 09/24/2023) 5 g 99   No current facility-administered medications for this visit.     Known medication allergies: Allergies  Allergen Reactions   Augmentin [Amoxicillin -Pot Clavulanate] Diarrhea   Ceftin [Cefuroxime Axetil] Other (See Comments)  Severe yeast infection   Nsaids Nausea Only and Other (See Comments)    Abdominal pain / severe reflux/chest pain     Physical examination: Blood pressure 110/70, pulse 87, temperature 98.1 F (36.7 C), temperature source Temporal, resp. rate 16, weight 141 lb (64 kg), last menstrual period 11/28/2014, SpO2 98%.  General: Alert, interactive, in no acute distress. HEENT: PERRLA, TMs pearly gray, turbinates moderately edematous with thick discharge, post-pharynx non erythematous, no erythema around last several upper teeth on the upper left thigh around the  canine. Neck: Supple without lymphadenopathy. Lungs: Clear to auscultation without wheezing, rhonchi or rales. {no increased work of breathing. CV: Normal S1, S2 without murmurs. Abdomen: Nondistended, nontender. Skin: Warm and dry, without lesions or rashes. Extremities:  No clubbing, cyanosis or edema. Neuro:   Grossly intact.  Diagnositics/Labs: None today   Assessment and plan:   Sinus infection - take course of Doxycyclin 100mg  1 tab twice a day for next 10 days - use Ryaltris  2 sprays each nostril twice a day.  Best if used after nasal rinse.  - recommend performing nasal saline rinse.  Use distilled water or boil water and let cool to room temperature - monitor for fever - get adequate rest and stay well hydrated  Allergies   - continue appropriate allergen avoidance measures  - continue allergen immunotherapy (allergy shots) per protocol and have access to your epinephrine  device.    - recommend nasal saline rinse 3-7 days a week to help keep nose/sinuses clear  - continue daily antihistamine Zyrtec 10mg  or Xyzal 5mg  as needed.   If needed can take additional dose around your injections to help with itching of the back  - use Ryaltris  nasal spray that helps with both nasal congestion and drainage control.  You can use Ryaltris  2 sprays each nostril twice a day as needed.  Follow-up 6-12 months or sooner if needed   I appreciate the opportunity to take part in Olevia's care. Please do not hesitate to contact me with questions.  Sincerely,   Danita Brain, MD Allergy/Immunology [facility]

## 2023-09-26 ENCOUNTER — Ambulatory Visit: Payer: Commercial Managed Care - PPO | Admitting: Allergy

## 2023-10-02 ENCOUNTER — Ambulatory Visit (INDEPENDENT_AMBULATORY_CARE_PROVIDER_SITE_OTHER): Payer: Self-pay | Admitting: *Deleted

## 2023-10-02 DIAGNOSIS — J309 Allergic rhinitis, unspecified: Secondary | ICD-10-CM | POA: Diagnosis not present

## 2023-10-07 ENCOUNTER — Other Ambulatory Visit (HOSPITAL_COMMUNITY): Payer: Self-pay

## 2023-10-07 ENCOUNTER — Ambulatory Visit (INDEPENDENT_AMBULATORY_CARE_PROVIDER_SITE_OTHER): Payer: Self-pay | Admitting: *Deleted

## 2023-10-07 ENCOUNTER — Telehealth: Payer: Self-pay | Admitting: Allergy

## 2023-10-07 DIAGNOSIS — J309 Allergic rhinitis, unspecified: Secondary | ICD-10-CM | POA: Diagnosis not present

## 2023-10-07 MED ORDER — DOXYCYCLINE HYCLATE 100 MG PO TABS
100.0000 mg | ORAL_TABLET | Freq: Two times a day (BID) | ORAL | 0 refills | Status: AC
  Filled 2023-10-07: qty 14, 7d supply, fill #0

## 2023-10-07 NOTE — Telephone Encounter (Addendum)
Patient called stating the pain behind her head is back and the antibiotics that she was prescribed helped her with that. Patient states she finished the antibiotics on Saturday Morning and is requesting a second round of antibiotics.

## 2023-10-07 NOTE — Telephone Encounter (Signed)
Called and notified patient of the note per Dr. Delorse Lek. I sent in the antibiotic to the pharmacy on file and patient verbalized understanding.

## 2023-10-16 ENCOUNTER — Ambulatory Visit (INDEPENDENT_AMBULATORY_CARE_PROVIDER_SITE_OTHER): Payer: Self-pay

## 2023-10-16 DIAGNOSIS — J309 Allergic rhinitis, unspecified: Secondary | ICD-10-CM | POA: Diagnosis not present

## 2023-10-28 ENCOUNTER — Ambulatory Visit (INDEPENDENT_AMBULATORY_CARE_PROVIDER_SITE_OTHER): Payer: Self-pay

## 2023-10-28 DIAGNOSIS — J309 Allergic rhinitis, unspecified: Secondary | ICD-10-CM

## 2023-10-29 ENCOUNTER — Encounter: Payer: Self-pay | Admitting: Obstetrics and Gynecology

## 2023-10-29 ENCOUNTER — Other Ambulatory Visit (HOSPITAL_COMMUNITY)
Admission: RE | Admit: 2023-10-29 | Discharge: 2023-10-29 | Disposition: A | Discharge status: Home / Self Care | Source: Ambulatory Visit | Attending: Obstetrics and Gynecology | Admitting: Obstetrics and Gynecology

## 2023-10-29 ENCOUNTER — Ambulatory Visit (INDEPENDENT_AMBULATORY_CARE_PROVIDER_SITE_OTHER): Payer: Commercial Managed Care - PPO | Admitting: Obstetrics and Gynecology

## 2023-10-29 VITALS — BP 104/64 | HR 85 | Temp 98.2°F | Ht 63.75 in | Wt 137.0 lb

## 2023-10-29 DIAGNOSIS — Z124 Encounter for screening for malignant neoplasm of cervix: Secondary | ICD-10-CM

## 2023-10-29 DIAGNOSIS — Z8041 Family history of malignant neoplasm of ovary: Secondary | ICD-10-CM | POA: Diagnosis not present

## 2023-10-29 DIAGNOSIS — Z1331 Encounter for screening for depression: Secondary | ICD-10-CM

## 2023-10-29 DIAGNOSIS — Z01419 Encounter for gynecological examination (general) (routine) without abnormal findings: Secondary | ICD-10-CM

## 2023-10-29 NOTE — Assessment & Plan Note (Signed)
 Cervical cancer screening performed according to ASCCP guidelines. Encouraged annual mammogram screening Colonoscopy UTD DXA UTD Labs and immunizations with her primary Encouraged safe sexual practices as indicated Encouraged healthy lifestyle practices with diet and exercise For patients under 50-59yo, I recommend 1200mg  calcium daily and 600IU of vitamin D daily.

## 2023-10-29 NOTE — Progress Notes (Signed)
 58 y.o. G0P0000 female here for annual exam. Married. Inpatient PT at [facility] Fm hx of breast and ovarian cancer, 2 maternal aunts. Will refer to genetics.  Patient's last menstrual period was 11/28/2014.   Abnormal bleeding: none Pelvic discharge or pain: none Breast mass, nipple discharge or skin changes : none  Last PAP:     Component Value Date/Time   DIAGPAP  06/14/2021 1621    - Negative for intraepithelial lesion or malignancy (NILM)   ADEQPAP  06/14/2021 1621    Satisfactory for evaluation. The presence or absence of an   ADEQPAP  06/14/2021 1621    endocervical/transformation zone component cannot be determined because   ADEQPAP of atrophy. 06/14/2021 1621   Last mammogram: 09/24/23 BIRADS 1, density c Last colonoscopy: 11/16/15, q71yr DXA: 04/14/23 normal Sexually active: yes  Exercising: no Smoker: no  GYN HISTORY: Np significant history  OB History  Gravida Para Term Preterm AB Living  0 0 0 0 0 0  SAB IAB Ectopic Multiple Live Births  0 0 0 0 0    Past Medical History:  Diagnosis Date   Allergy    cats   Anemia    NOS   Female fertility problem    hx of treatment   GERD (gastroesophageal reflux disease)    HSV infection    labialis   RECTAL BLEEDING 07/10/2007   Qualifier: Diagnosis of  By: Fabian Sharp MD, Neta Mends 2008 colonoscopy [facility].     Past Surgical History:  Procedure Laterality Date   CYST REMOVAL HAND Right    DILITATION & CURRETTAGE/HYSTROSCOPY WITH VERSAPOINT RESECTION  08/27/2012   Procedure: DILATATION & CURETTAGE/HYSTEROSCOPY WITH VERSAPOINT RESECTION;  Surgeon: Genia Del, MD;  Location: [facility];  Service: Gynecology;;   LASIK  2009   mono vision   TRIGGER FINGER RELEASE Right 06/02/2017   Procedure: RELEASE TRIGGER FINGER/A-1 PULLEY RIGHT RING TRIGGER RELEASE;  Surgeon: Betha Loa, MD;  Location: [facility];  Service: Orthopedics;  Laterality: Right;    Current Outpatient Medications on File Prior to  Visit  Medication Sig Dispense Refill   APPLE CIDER VINEGAR PO Take by mouth.     Ascorbic Acid (VITAMIN C PO) Take by mouth.     cetirizine (ZYRTEC) 10 MG tablet Take 10 mg by mouth daily as needed for allergies.     cholecalciferol (VITAMIN D) 1000 UNITS tablet Take 1,000 Units by mouth daily.     Cyanocobalamin (VITAMIN B-12 PO) Take by mouth.     EPINEPHrine (EPIPEN 2-PAK) 0.3 mg/0.3 mL IJ SOAJ injection Inject 0.3 mg into the muscle as needed for anaphylaxis. 0.3 mL 1   EPINEPHrine (EPIPEN 2-PAK) 0.3 mg/0.3 mL IJ SOAJ injection Inject 0.3 mg into the muscle as needed for anaphylaxis. 2 each 1   Omega-3 Fatty Acids (FISH OIL) 1200 MG CAPS Take 1,200 mg by mouth daily.     triamcinolone (KENALOG) 0.1 % paste Press a small dab (thin layer) to lesion until thin film develops, don't rub in up to 3 times a day. 5 g 99   TURMERIC PO Take by mouth.     valACYclovir (VALTREX) 1000 MG tablet Take 2 tablets (2,000 mg total) by mouth and repeat in 12 hours or as directed (Patient taking differently: Take 2,000 mg by mouth as needed.) 30 tablet 0   No current facility-administered medications on file prior to visit.    Social History   Socioeconomic History   Marital status: Married  Spouse name: Not on file   Number of children: Not on file   Years of education: Not on file   Highest education level: Not on file  Occupational History   Occupation: PA    Employer: [employer]    Comment: Rehab  Tobacco Use   Smoking status: Never    Passive exposure: Never   Smokeless tobacco: Never  Vaping Use   Vaping status: Never Used  Substance and Sexual Activity   Alcohol use: Yes    Comment: once every   Drug use: No   Sexual activity: Yes    Partners: Male    Birth control/protection: Post-menopausal    Comment: 1st intercourse- 29, partners- 1  Other Topics Concern   Not on file  Social History Narrative   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   Divorced - remarried   Regular  exercise - yes   Cat exposure BF   Form [place] in [place] x 27 years   hhof 2 pet cat   Social Drivers of Corporate investment banker Strain: Low Risk  (04/08/2023)   Overall Financial Resource Strain (CARDIA)    Difficulty of Paying Living Expenses: Not hard at all  Food Insecurity: No Food Insecurity (04/08/2023)   Hunger Vital Sign    Worried About Running Out of Food in the Last Year: Never true    Ran Out of Food in the Last Year: Never true  Transportation Needs: No Transportation Needs (04/08/2023)   PRAPARE - Administrator, Civil Service (Medical): No    Lack of Transportation (Non-Medical): No  Physical Activity: Insufficiently Active (04/08/2023)   Exercise Vital Sign    Days of Exercise per Week: 1 day    Minutes of Exercise per Session: 30 min  Stress: No Stress Concern Present (04/08/2023)   [employer] of Occupational Health - Occupational Stress Questionnaire    Feeling of Stress : Not at all  Social Connections: Socially Integrated (04/08/2023)   Social Connection and Isolation Panel [NHANES]    Frequency of Communication with Friends and Family: More than three times a week    Frequency of Social Gatherings with Friends and Family: Twice a week    Attends Religious Services: More than 4 times per year    Active Member of [employer] or Organizations: Yes    Attends Engineer, structural: More than 4 times per year    Marital Status: Married  Catering manager Violence: Not At Risk (04/08/2023)   Humiliation, Afraid, Rape, and Kick questionnaire    Fear of Current or Ex-Partner: No    Emotionally Abused: No    Physically Abused: No    Sexually Abused: No    Family History  Problem Relation Age of Onset   Hypertension Mother    Stroke Mother    Allergic rhinitis Mother    Diabetes Father    Hyperlipidemia Father    Heart disease Father    Stroke Father    Colon polyps Brother        elev TG   Allergic rhinitis Brother    Breast cancer  Maternal Aunt 40 - 49   Ovarian cancer Maternal Aunt 60 - 69   Liver disease Maternal Uncle        alcohol related   Stroke Maternal Grandmother    Diabetes Maternal Grandmother    Heart disease Maternal Grandmother    Diabetes Maternal Grandfather    Alcohol abuse Cousin  Cancer - Colon Cousin     Allergies  Allergen Reactions   Augmentin [Amoxicillin-Pot Clavulanate] Diarrhea   Ceftin [Cefuroxime Axetil] Other (See Comments)    Severe yeast infection   Nsaids Nausea Only and Other (See Comments)    Abdominal pain / severe reflux/chest pain      PE Today's Vitals   10/29/23 1528  BP: 104/64  Pulse: 85  Temp: 98.2 F (36.8 C)  TempSrc: Oral  SpO2: 98%  Weight: 137 lb (62.1 kg)  Height: 5' 3.75" (1.619 m)   Body mass index is 23.7 kg/m.  Physical Exam Vitals reviewed. Exam conducted with a chaperone present.  Constitutional:      General: She is not in acute distress.    Appearance: Normal appearance.  HENT:     Head: Normocephalic and atraumatic.     Nose: Nose normal.  Eyes:     Extraocular Movements: Extraocular movements intact.     Conjunctiva/sclera: Conjunctivae normal.  Neck:     Thyroid: No thyroid mass, thyromegaly or thyroid tenderness.  Pulmonary:     Effort: Pulmonary effort is normal.  Chest:     Chest wall: No mass or tenderness.  Breasts:    Right: Normal. No swelling, mass, nipple discharge, skin change or tenderness.     Left: Normal. No swelling, mass, nipple discharge, skin change or tenderness.  Abdominal:     General: There is no distension.     Palpations: Abdomen is soft.     Tenderness: There is no abdominal tenderness.  Genitourinary:    General: Normal vulva.     Exam position: Lithotomy position.     Urethra: No prolapse.     Vagina: Normal. No vaginal discharge or bleeding.     Cervix: Normal. No lesion.     Uterus: Normal. Not enlarged and not tender.      Adnexa: Right adnexa normal and left adnexa normal.   Musculoskeletal:        General: Normal range of motion.     Cervical back: Normal range of motion.  Lymphadenopathy:     Upper Body:     Right upper body: No axillary adenopathy.     Left upper body: No axillary adenopathy.     Lower Body: No right inguinal adenopathy. No left inguinal adenopathy.  Skin:    General: Skin is warm and dry.  Neurological:     General: No focal deficit present.     Mental Status: She is alert.  Psychiatric:        Mood and Affect: Mood normal.        Behavior: Behavior normal.      Assessment and Plan:        Well woman exam with routine gynecological exam Assessment & Plan: Cervical cancer screening performed according to ASCCP guidelines. Encouraged annual mammogram screening Colonoscopy UTD DXA UTD Labs and immunizations with her primary Encouraged safe sexual practices as indicated Encouraged healthy lifestyle practices with diet and exercise For patients under 50-70yo, I recommend 1200mg  calcium daily and 600IU of vitamin D daily.    Cervical cancer screening -     Cytology - PAP  Family history of ovarian cancer -     Ambulatory referral to Genetics   Rosalyn Gess, MD

## 2023-10-29 NOTE — Patient Instructions (Signed)

## 2023-10-31 LAB — CYTOLOGY - PAP
Comment: NEGATIVE
Diagnosis: NEGATIVE
High risk HPV: NEGATIVE

## 2023-11-03 ENCOUNTER — Encounter: Payer: Self-pay | Admitting: Obstetrics and Gynecology

## 2023-11-06 ENCOUNTER — Telehealth: Payer: Self-pay | Admitting: Genetic Counselor

## 2023-11-07 ENCOUNTER — Ambulatory Visit (INDEPENDENT_AMBULATORY_CARE_PROVIDER_SITE_OTHER): Payer: Self-pay

## 2023-11-07 DIAGNOSIS — J309 Allergic rhinitis, unspecified: Secondary | ICD-10-CM

## 2023-11-13 ENCOUNTER — Ambulatory Visit (INDEPENDENT_AMBULATORY_CARE_PROVIDER_SITE_OTHER): Payer: Self-pay

## 2023-11-13 DIAGNOSIS — J309 Allergic rhinitis, unspecified: Secondary | ICD-10-CM

## 2023-12-02 ENCOUNTER — Ambulatory Visit (INDEPENDENT_AMBULATORY_CARE_PROVIDER_SITE_OTHER): Payer: Self-pay

## 2023-12-02 DIAGNOSIS — J309 Allergic rhinitis, unspecified: Secondary | ICD-10-CM

## 2023-12-10 ENCOUNTER — Other Ambulatory Visit (HOSPITAL_COMMUNITY): Payer: Self-pay

## 2023-12-10 MED ORDER — TRIAMCINOLONE ACETONIDE 0.1 % MT PSTE
1.0000 | PASTE | Freq: Three times a day (TID) | OROMUCOSAL | 12 refills | Status: DC
  Filled 2023-12-10: qty 5, 2d supply, fill #0
  Filled 2024-01-08: qty 5, 5d supply, fill #0

## 2023-12-16 ENCOUNTER — Ambulatory Visit (INDEPENDENT_AMBULATORY_CARE_PROVIDER_SITE_OTHER): Payer: Self-pay

## 2023-12-16 DIAGNOSIS — J309 Allergic rhinitis, unspecified: Secondary | ICD-10-CM

## 2023-12-22 ENCOUNTER — Other Ambulatory Visit (HOSPITAL_COMMUNITY): Payer: Self-pay

## 2023-12-24 ENCOUNTER — Other Ambulatory Visit: Payer: Self-pay

## 2023-12-25 ENCOUNTER — Other Ambulatory Visit

## 2023-12-25 ENCOUNTER — Inpatient Hospital Stay: Admitting: Genetic Counselor

## 2023-12-30 ENCOUNTER — Ambulatory Visit (INDEPENDENT_AMBULATORY_CARE_PROVIDER_SITE_OTHER): Payer: Self-pay

## 2023-12-30 DIAGNOSIS — J309 Allergic rhinitis, unspecified: Secondary | ICD-10-CM | POA: Diagnosis not present

## 2024-01-08 ENCOUNTER — Other Ambulatory Visit (HOSPITAL_COMMUNITY): Payer: Self-pay

## 2024-01-08 MED ORDER — TRIAMCINOLONE ACETONIDE 0.1 % MT PSTE
PASTE | OROMUCOSAL | 99 refills | Status: DC
  Filled 2024-01-08: qty 5, 30d supply, fill #0

## 2024-01-26 ENCOUNTER — Encounter: Payer: Self-pay | Admitting: Family Medicine

## 2024-01-26 ENCOUNTER — Other Ambulatory Visit (HOSPITAL_COMMUNITY): Payer: Self-pay

## 2024-01-26 ENCOUNTER — Ambulatory Visit: Admitting: Family Medicine

## 2024-01-26 VITALS — BP 110/70 | HR 78 | Temp 98.5°F | Wt 139.2 lb

## 2024-01-26 DIAGNOSIS — S0006XA Insect bite (nonvenomous) of scalp, initial encounter: Secondary | ICD-10-CM

## 2024-01-26 DIAGNOSIS — W57XXXA Bitten or stung by nonvenomous insect and other nonvenomous arthropods, initial encounter: Secondary | ICD-10-CM | POA: Diagnosis not present

## 2024-01-26 MED ORDER — DOXYCYCLINE HYCLATE 100 MG PO TABS
100.0000 mg | ORAL_TABLET | Freq: Two times a day (BID) | ORAL | 0 refills | Status: DC
  Filled 2024-01-26: qty 20, 10d supply, fill #0

## 2024-01-26 NOTE — Progress Notes (Signed)
   Subjective:    Patient ID: Valerie [name], female    DOB: [birth year], 58 y.o.   MRN: [id number]  HPI Here for a lesion on her forehead that her hair stylist noticed 3 weeks ago. This does not bother her, but she is concerned about it.    Review of Systems  Constitutional: Negative.   Respiratory: Negative.    Cardiovascular: Negative.        Objective:   Physical Exam Constitutional:      Appearance: Normal appearance.  Cardiovascular:     Rate and Rhythm: Normal rate and regular rhythm.     Pulses: Normal pulses.     Heart sounds: Normal heart sounds.  Pulmonary:     Effort: Pulmonary effort is normal.     Breath sounds: Normal breath sounds.  Skin:    Comments: There is a small tick embedded in the skin of her left forehead at the hairline   Neurological:     Mental Status: She is alert.           Assessment & Plan:  Tick bite. The tick was removed using forceps. We will cover with 10 days of Doxycycline .  [name], MD

## 2024-01-29 ENCOUNTER — Ambulatory Visit (INDEPENDENT_AMBULATORY_CARE_PROVIDER_SITE_OTHER): Payer: Self-pay

## 2024-01-29 DIAGNOSIS — J309 Allergic rhinitis, unspecified: Secondary | ICD-10-CM | POA: Diagnosis not present

## 2024-02-06 ENCOUNTER — Ambulatory Visit (INDEPENDENT_AMBULATORY_CARE_PROVIDER_SITE_OTHER): Payer: Self-pay

## 2024-02-06 DIAGNOSIS — J309 Allergic rhinitis, unspecified: Secondary | ICD-10-CM | POA: Diagnosis not present

## 2024-02-19 ENCOUNTER — Inpatient Hospital Stay: Attending: Internal Medicine | Admitting: Genetic Counselor

## 2024-02-19 ENCOUNTER — Ambulatory Visit

## 2024-02-19 ENCOUNTER — Inpatient Hospital Stay

## 2024-02-19 ENCOUNTER — Encounter: Payer: Self-pay | Admitting: Genetic Counselor

## 2024-02-19 ENCOUNTER — Other Ambulatory Visit: Payer: Self-pay | Admitting: Genetic Counselor

## 2024-02-19 DIAGNOSIS — Z803 Family history of malignant neoplasm of breast: Secondary | ICD-10-CM

## 2024-02-19 DIAGNOSIS — J309 Allergic rhinitis, unspecified: Secondary | ICD-10-CM

## 2024-02-19 DIAGNOSIS — Z8041 Family history of malignant neoplasm of ovary: Secondary | ICD-10-CM | POA: Diagnosis not present

## 2024-02-19 DIAGNOSIS — Z1379 Encounter for other screening for genetic and chromosomal anomalies: Secondary | ICD-10-CM

## 2024-02-19 DIAGNOSIS — Z800 Family history of malignant neoplasm of digestive organs: Secondary | ICD-10-CM | POA: Diagnosis not present

## 2024-02-19 LAB — GENETIC SCREENING ORDER

## 2024-02-19 NOTE — Progress Notes (Signed)
 REFERRING PROVIDER: Charlett Apolinar POUR, MD [address] [place],  [place] [address]  PRIMARY PROVIDER:  Charlett Apolinar POUR, MD  PRIMARY REASON FOR VISIT:  1. Family history of ovarian cancer   2. Family history of breast cancer   3. Family history of colon cancer    HISTORY OF PRESENT ILLNESS:   [name], a 58 y.o. female, was seen for a [place] cancer genetics consultation at the request of Dr. Charlett due to a family history of cancer.  Valerie [name] presents to clinic today to discuss the possibility of a hereditary predisposition to cancer, to discuss genetic testing, and to further clarify her future cancer risks, as well as potential cancer risks for family members.   Valerie [name] is a 58 y.o. female with no personal history of cancer.    RISK FACTORS:  Menarche was at age 40-15.  First live birth at age n/a.  OCP use for approximately 1 years.  Ovaries intact: yes.  Uterus intact: yes.  Menopausal status: postmenopausal, menopause at age 23 HRT use: 0 years. Colonoscopy: yes; normal. Mammogram within the last year: yes. Number of breast biopsies: 0. Any excessive radiation exposure in the past: no  Past Medical History:  Diagnosis Date   Allergy    cats   Anemia    NOS   Female fertility problem    hx of treatment   GERD (gastroesophageal reflux disease)    HSV infection    labialis   RECTAL BLEEDING 07/10/2007   Qualifier: Diagnosis of  By: Charlett MD, Apolinar POUR 2008 colonoscopy [facility].     Past Surgical History:  Procedure Laterality Date   CYST REMOVAL HAND Right    DILITATION & CURRETTAGE/HYSTROSCOPY WITH VERSAPOINT RESECTION  08/27/2012   Procedure: DILATATION & CURETTAGE/HYSTEROSCOPY WITH VERSAPOINT RESECTION;  Surgeon: Percilla Burly, MD;  Location: [facility];  Service: Gynecology;;   LASIK  2009   mono vision   TRIGGER FINGER RELEASE Right 06/02/2017   Procedure: RELEASE TRIGGER FINGER/A-1 PULLEY RIGHT RING TRIGGER RELEASE;  Surgeon: Murrell Drivers, MD;   Location: [facility];  Service: Orthopedics;  Laterality: Right;    Social History   Socioeconomic History   Marital status: Married    Spouse name: Not on file   Number of children: Not on file   Years of education: Not on file   Highest education level: Not on file  Occupational History   Occupation: PA    Employer: [employer]    Comment: Rehab  Tobacco Use   Smoking status: Never    Passive exposure: Never   Smokeless tobacco: Never  Vaping Use   Vaping status: Never Used  Substance and Sexual Activity   Alcohol use: Yes    Comment: once every   Drug use: No   Sexual activity: Yes    Partners: Male    Birth control/protection: Post-menopausal    Comment: 1st intercourse- 29, partners- 1  Other Topics Concern   Not on file  Social History Narrative   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   Divorced - remarried   Regular exercise - yes   Cat exposure BF   Form [place] in [place] x 27 years   hhof 2 pet cat   Social Drivers of Corporate investment banker Strain: Low Risk  (04/08/2023)   Overall Financial Resource Strain (CARDIA)    Difficulty of Paying Living Expenses: Not hard at all  Food Insecurity: No Food Insecurity (04/08/2023)  Hunger Vital Sign    Worried About Running Out of Food in the Last Year: Never true    Ran Out of Food in the Last Year: Never true  Transportation Needs: No Transportation Needs (04/08/2023)   PRAPARE - Administrator, Civil Service (Medical): No    Lack of Transportation (Non-Medical): No  Physical Activity: Insufficiently Active (04/08/2023)   Exercise Vital Sign    Days of Exercise per Week: 1 day    Minutes of Exercise per Session: 30 min  Stress: No Stress Concern Present (04/08/2023)   [employer] of Occupational Health - Occupational Stress Questionnaire    Feeling of Stress : Not at all  Social Connections: Socially Integrated (04/08/2023)   Social Connection and Isolation Panel    Frequency of  Communication with Friends and Family: More than three times a week    Frequency of Social Gatherings with Friends and Family: Twice a week    Attends Religious Services: More than 4 times per year    Active Member of [employer] or Organizations: Yes    Attends Engineer, structural: More than 4 times per year    Marital Status: Married     FAMILY HISTORY:  We obtained a detailed, 4-generation family history.  Significant diagnoses are listed below: Family History  Problem Relation Age of Onset   Colon polyps Brother        elev TG   Ovarian cancer Maternal Aunt 34 - 69   Colon cancer Cousin 36 - 9       maternal first cousin   Breast cancer Other 33 - 68       maternal first cousin once removed     Valerie [name] is unaware of previous family history of genetic testing for hereditary cancer risks. There is no reported Ashkenazi Jewish ancestry.   GENETIC COUNSELING ASSESSMENT: Valerie [name] is a 58 y.o. female with a family history of cancer which is somewhat suggestive of a hereditary predisposition to cancer. We, therefore, discussed and recommended the following at today's visit.   DISCUSSION: We discussed that 5 - 10% of cancer is hereditary, with most cases of hereditary breast and ovarian cancer associated with BRCA1/2.  There are other genes that can be associated with hereditary breast and ovarian cancer syndromes.  We discussed that testing is beneficial for several reasons, including knowing about other cancer risks, identifying potential screening and risk-reduction options that may be appropriate, and to understanding if other family members could be at risk for cancer and allowing them to undergo genetic testing.  We reviewed the characteristics, features and inheritance patterns of hereditary cancer syndromes. We also discussed genetic testing, including the appropriate family members to test, the process of testing, insurance coverage and turn-around-time for results. We  discussed the implications of a negative, positive, carrier and/or variant of uncertain significant result. We discussed that negative results would be uninformative given that Ms. Toto does not have a personal history of cancer. We recommended Ms. Hornberger pursue genetic testing for a panel that contains genes associated with breast, ovarian, and colon cancer.  Ms. Spaid was offered a common hereditary cancer panel (40 genes) and an expanded pan-cancer panel (77 genes). Ms. Eberly was informed of the benefits and limitations of each panel, including that expanded pan-cancer panels contain several genes that do not have clear management guidelines at this point in time.  We also discussed that as the number of genes included on a panel increases, the  chances of variants of uncertain significance increases.  After considering the benefits and limitations of each gene panel, Ms. Lesnick elected to have [facility] CancerNext-Expanded Panel.  The CancerNext-Expanded gene panel offered by [facility] and includes sequencing, rearrangement, and RNA analysis for the following 77 genes: AIP, ALK, APC, ATM, AXIN2, BAP1, BARD1, BMPR1A, BRCA1, BRCA2, BRIP1, CDC73, CDH1, CDK4, CDKN1B, CDKN2A, CEBPA, CHEK2, CTNNA1, DDX41, DICER1, ETV6, FH, FLCN, GATA2, LZTR1, MAX, MBD4, MEN1, MET, MLH1, MSH2, MSH3, MSH6, MUTYH, NF1, NF2, NTHL1, PALB2, PHOX2B, PMS2, POT1, PRKAR1A, PTCH1, PTEN, RAD51C, RAD51D, RB1, RET, RPS20, RUNX1, SDHA, SDHAF2, SDHB, SDHC, SDHD, SMAD4, SMARCA4, SMARCB1, SMARCE1, STK11, SUFU, TMEM127, TP53, TSC1, TSC2, VHL, and WT1 (sequencing and deletion/duplication); EGFR, HOXB13, KIT, MITF, PDGFRA, POLD1, and POLE (sequencing only); EPCAM and GREM1 (deletion/duplication only).    Based on Ms. Boger's family history of cancer, she meets medical criteria for genetic testing. Though Ms. Honold is not personally affected, there are no affected family members that are willing/able to undergo hereditary cancer testing. Her family lives in  [place]. Despite that she meets criteria, she may still have an out of pocket cost. We discussed that if her out of pocket cost for testing is over $100, the laboratory should contact them to discuss self-pay prices, patient pay assistance programs, if applicable, and other billing options.  We discussed that some people do not want to undergo genetic testing due to fear of genetic discrimination.  A federal law called the Genetic Information Non-Discrimination Act (GINA) of 2008 helps protect individuals against genetic discrimination based on their genetic test results.  It impacts both health insurance and employment.  With health insurance, it protects against increased premiums, being kicked off insurance or being forced to take a test in order to be insured.  For employment it protects against hiring, firing and promoting decisions based on genetic test results.  GINA does not apply to those in the [employer], those who work for companies with less than 15 employees, and new life insurance or long-term disability insurance policies.  Health status due to a cancer diagnosis is not protected under GINA.  PLAN: After considering the risks, benefits, and limitations, Ms. Delia provided informed consent to pursue genetic testing and the blood sample was sent to [facility] for analysis of the CancerNext-Expanded Panel. Results should be available within approximately 2-3 weeks' time, at which point they will be disclosed by telephone to Ms. Bodkins, as will any additional recommendations warranted by these results. Ms. Gossett will receive a summary of her genetic counseling visit and a copy of her results once available. This information will also be available in Epic.   Ms. Mcinroy questions were answered to her satisfaction today. Our contact information was provided should additional questions or concerns arise. Thank you for the referral and allowing us  to share in the care of your patient.   Giles Currie, MS, [facility] Genetic Counselor [email or web address] (P) [phone]  40 minutes were spent on the date of the encounter in service to the patient including preparation, face-to-face consultation, documentation and care coordination. The patient was seen alone.  Drs. Gudena and/or Lanny were available to discuss this case as needed.  _______________________________________________________________________ For Office Staff:  Number of people involved in session: 1 Was an Intern/ student involved with case: no

## 2024-03-01 ENCOUNTER — Ambulatory Visit: Admitting: Family Medicine

## 2024-03-01 ENCOUNTER — Telehealth: Payer: Self-pay | Admitting: Genetic Counselor

## 2024-03-01 ENCOUNTER — Other Ambulatory Visit (HOSPITAL_BASED_OUTPATIENT_CLINIC_OR_DEPARTMENT_OTHER): Payer: Self-pay

## 2024-03-01 ENCOUNTER — Encounter: Payer: Self-pay | Admitting: Family Medicine

## 2024-03-01 ENCOUNTER — Encounter: Payer: Self-pay | Admitting: Genetic Counselor

## 2024-03-01 VITALS — BP 122/80 | HR 88 | Resp 16 | Ht 63.75 in | Wt 139.2 lb

## 2024-03-01 DIAGNOSIS — M549 Dorsalgia, unspecified: Secondary | ICD-10-CM

## 2024-03-01 DIAGNOSIS — M545 Low back pain, unspecified: Secondary | ICD-10-CM | POA: Diagnosis not present

## 2024-03-01 DIAGNOSIS — G8929 Other chronic pain: Secondary | ICD-10-CM

## 2024-03-01 DIAGNOSIS — Z1379 Encounter for other screening for genetic and chromosomal anomalies: Secondary | ICD-10-CM | POA: Insufficient documentation

## 2024-03-01 MED ORDER — GABAPENTIN 100 MG PO CAPS
100.0000 mg | ORAL_CAPSULE | Freq: Two times a day (BID) | ORAL | 0 refills | Status: AC
Start: 2024-03-01 — End: ?
  Filled 2024-03-01: qty 60, 30d supply, fill #0

## 2024-03-01 MED ORDER — METHOCARBAMOL 500 MG PO TABS
500.0000 mg | ORAL_TABLET | Freq: Three times a day (TID) | ORAL | 0 refills | Status: AC | PRN
Start: 2024-03-01 — End: ?
  Filled 2024-03-01: qty 45, 15d supply, fill #0

## 2024-03-01 NOTE — Telephone Encounter (Signed)
 I contacted Ms. Hamill to discuss her genetic testing results. No pathogenic variants were identified in the 77 genes analyzed. 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 Detailed clinic note to follow.  The test report has been scanned into EPIC and is located under the Molecular Pathology section of the Results Review tab.  A portion of the result report is included below for reference.   Amberleigh Gerken, MS, [facility] Genetic Counselor [email or web address] (P) [phone]

## 2024-03-01 NOTE — Patient Instructions (Addendum)
 A few things to remember from today's visit:  Upper back pain, chronic - Plan: Ambulatory referral to Physical Therapy, methocarbamol  (ROBAXIN ) 500 MG tablet  Chronic right-sided low back pain, unspecified whether sciatica present - Plan: Ambulatory referral to Physical Therapy, methocarbamol  (ROBAXIN ) 500 MG tablet, gabapentin  (NEURONTIN ) 100 MG capsule Gabapentin  1 cap 2 times daily. Methocarbamol  causes drowsiness. Arrange appt with Dr Charlett for 3-4 weeks follow up.  If you need refills for medications you take chronically, please call your pharmacy. Do not use My Chart to request refills or for acute issues that need immediate attention. If you send a my chart message, it may take a few days to be addressed, specially if I am not in the office.  Please be sure medication list is accurate. If a new problem present, please set up appointment sooner than planned today.

## 2024-03-01 NOTE — Progress Notes (Signed)
 ACUTE VISIT Chief Complaint  Patient presents with   Spasms    Right shoulder down to lower back & leg    Discussed the use of AI scribe software for clinical note transcription with the patient, who gave verbal consent to proceed.  History of Present Illness Valerie [name] is a 58 year old female with PMHx significant for GERD and chronic pain presents with muscle spasms and lower back pain.  She has been experiencing muscle spasms and lower back pain, which have worsened over the past four weeks. The pain originates in the right trapezium, extends to the interscapular region, lower back, and sometimes radiates to her leg, particularly at night. The pain is described as achy, interfering with sleep, and is rated as 4 to 5 on average, escalating to 7 when severe.  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 An MRI of her lower back in May 2019 revealed a bulging disc.   She has previously consulted an orthopedist and undergone therapies such as deep tissue massage, trigger point injections, and dry needling, which provided temporary relief.  Her current medication regimen includes occasional use of her husband's gabapentin  300 mg at night, which is effective but causes wooziness the next day. She has previously used gabapentin  100 mg once or twice daily and Robaxin  for muscle relaxation.   She cannot take NSAIDs like ibuprofen, naproxen, Celebrex, or meloxicam due to gastrointestinal issues.  No fever,chills,cough, abdominal pain, saddle anesthesia, LE numbness or tingling, changes in bowel habits, or urinary symptoms.  Review of Systems  HENT:  Negative for sore throat.   Respiratory:  Negative for shortness of breath and wheezing.   Cardiovascular:  Negative for chest pain, palpitations and leg swelling.  Gastrointestinal:  Negative for abdominal pain, nausea and vomiting.  Genitourinary:  Negative for decreased urine volume,  dysuria and hematuria.  Skin:  Negative for rash.  Neurological:  Negative for syncope and weakness.  Psychiatric/Behavioral:  Negative for confusion.   See other pertinent positives and negatives in HPI.  Current Outpatient Medications on File Prior to Visit  Medication Sig Dispense Refill   APPLE CIDER VINEGAR PO Take by mouth.     Ascorbic Acid (VITAMIN C PO) Take by mouth.     Berberine Chloride (BERBERINE HCI PO) Take by mouth.     cetirizine (ZYRTEC) 10 MG tablet Take 10 mg by mouth daily as needed for allergies.     cholecalciferol (VITAMIN D) 1000 UNITS tablet Take 1,000 Units by mouth daily.     Cyanocobalamin (VITAMIN B-12 PO) Take by mouth.     doxycycline  (VIBRA -TABS) 100 MG tablet Take 1 tablet (100 mg total) by mouth 2 (two) times daily. 20 tablet 0   EPINEPHrine  (EPIPEN  2-PAK) 0.3 mg/0.3 mL IJ SOAJ injection Inject 0.3 mg into the muscle as needed for anaphylaxis. 0.3 mL 1   EPINEPHrine  (EPIPEN  2-PAK) 0.3 mg/0.3 mL IJ SOAJ injection Inject 0.3 mg into the muscle as needed for anaphylaxis. 2 each 1   Omega-3 Fatty Acids (FISH OIL) 1200 MG CAPS Take 1,200 mg by mouth daily.     triamcinolone  (KENALOG ) 0.1 % paste Press a small dab (thin layer) to lesion until thin film develops, don't rub in up to 3 times a day. 5 g 99   triamcinolone  (KENALOG ) 0.1 % paste Apply  a thin layer to the affected area up to 3 (three) times daily. Press a  small dab to lesion until thin film develops, don't rub in 5 g 12   TURMERIC PO Take by mouth.     valACYclovir  (VALTREX ) 1000 MG tablet Take 2 tablets (2,000 mg total) by mouth and repeat in 12 hours or as directed 30 tablet 0   No current facility-administered medications on file prior to visit.    Past Medical History:  Diagnosis Date   Allergy    cats   Anemia    NOS   Cataract    Female fertility problem    hx of treatment   GERD (gastroesophageal reflux disease)    HSV infection    labialis   RECTAL BLEEDING 07/10/2007    Qualifier: Diagnosis of  By: Charlett MD, Apolinar POUR 2008 colonoscopy [facility].    Allergies  Allergen Reactions   Augmentin [Amoxicillin -Pot Clavulanate] Diarrhea   Ceftin [Cefuroxime Axetil] Other (See Comments)    Severe yeast infection   Nsaids Nausea Only and Other (See Comments)    Abdominal pain / severe reflux/chest pain    Social History   Socioeconomic History   Marital status: Married    Spouse name: Not on file   Number of children: Not on file   Years of education: Not on file   Highest education level: Not on file  Occupational History   Occupation: PA    Employer: [employer]    Comment: Rehab  Tobacco Use   Smoking status: Never    Passive exposure: Never   Smokeless tobacco: Never  Vaping Use   Vaping status: Never Used  Substance and Sexual Activity   Alcohol use: Yes    Comment: once every   Drug use: No   Sexual activity: Yes    Partners: Male    Birth control/protection: Post-menopausal    Comment: 1st intercourse- 29, partners- 1  Other Topics Concern   Not on file  Social History Narrative   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   Divorced - remarried   Regular exercise - yes   Cat exposure BF   Form [place] in [place] x 27 years   hhof 2 pet cat   Social Drivers of Corporate investment banker Strain: Low Risk  (04/08/2023)   Overall Financial Resource Strain (CARDIA)    Difficulty of Paying Living Expenses: Not hard at all  Food Insecurity: No Food Insecurity (04/08/2023)   Hunger Vital Sign    Worried About Running Out of Food in the Last Year: Never true    Ran Out of Food in the Last Year: Never true  Transportation Needs: No Transportation Needs (04/08/2023)   PRAPARE - Administrator, Civil Service (Medical): No    Lack of Transportation (Non-Medical): No  Physical Activity: Insufficiently Active (04/08/2023)   Exercise Vital Sign    Days of Exercise per Week: 1 day    Minutes of Exercise per Session: 30 min  Stress: No Stress  Concern Present (04/08/2023)   [employer] of Occupational Health - Occupational Stress Questionnaire    Feeling of Stress : Not at all  Social Connections: Socially Integrated (04/08/2023)   Social Connection and Isolation Panel    Frequency of Communication with Friends and Family: More than three times a week    Frequency of Social Gatherings with Friends and Family: Twice a week    Attends Religious Services: More than 4 times per year    Active Member of Clubs or Organizations: Yes    Attends  Club or Organization Meetings: More than 4 times per year    Marital Status: Married   Vitals:   03/01/24 1419  BP: 122/80  Pulse: 88  Resp: 16  SpO2: 99%   Body mass index is 24.09 kg/m.  Physical Exam Vitals and nursing note reviewed.  Constitutional:      General: She is not in acute distress.    Appearance: She is well-developed. She is not ill-appearing.  HENT:     Head: Normocephalic and atraumatic.  Eyes:     Conjunctiva/sclera: Conjunctivae normal.  Cardiovascular:     Rate and Rhythm: Normal rate and regular rhythm.     Heart sounds: No murmur heard. Pulmonary:     Effort: Pulmonary effort is normal. No respiratory distress.     Breath sounds: Normal breath sounds.  Abdominal:     Palpations: Abdomen is soft. There is no hepatomegaly or mass.     Tenderness: There is no abdominal tenderness.  Musculoskeletal:     Cervical back: Tenderness present.     Thoracic back: Tenderness present.     Lumbar back: Tenderness present. Negative right straight leg raise test and negative left straight leg raise test.       Back:  Lymphadenopathy:     Cervical: No cervical adenopathy.  Skin:    General: Skin is warm.     Findings: No erythema or rash.  Neurological:     General: No focal deficit present.     Mental Status: She is alert and oriented to person, place, and time.     Gait: Gait normal.     Deep Tendon Reflexes:     Reflex Scores:      Patellar reflexes are  2+ on the right side and 2+ on the left side. Psychiatric:        Mood and Affect: Mood and affect normal.   ASSESSMENT AND PLAN:  Upper back pain, chronic PT will be arranged. Gabapentin  and Methocarbamol  recommended. Monitor for new symptoms. I do not think imaging is needed today.  -     Ambulatory referral to Physical Therapy -     Methocarbamol ; Take 1 tablet (500 mg total) by mouth every 8 (eight) hours as needed for muscle spasms.  Dispense: 45 tablet; Refill: 0  Chronic right-sided low back pain, unspecified whether sciatica present With radiation to RLE. She reports that gabapentin  100 mg twice daily has helped in the past, prescription sent. Methocarbamol  500 mg 3 times daily as needed also recommended. We discussed some side effects of medications. She agrees with PT evaluation, in the past dry needling has helped. Follow-up with PCP in 3 to 4 weeks.  -     Ambulatory referral to Physical Therapy -     Methocarbamol ; Take 1 tablet (500 mg total) by mouth every 8 (eight) hours as needed for muscle spasms.  Dispense: 45 tablet; Refill: 0 -     Gabapentin ; Take 1 capsule (100 mg total) by mouth 2 (two) times daily.  Dispense: 60 capsule; Refill: 0  I spent a total of 30 minutes in both face to face and non face to face activities for this visit on the date of this encounter. During this time history was obtained and documented, examination was performed, prior imaging reviewed, and assessment/plan discussed.  Return in about 22 days (around 03/23/2024) for Back pain with PCP.  Amellia Panik G. [place], MD  [facility]. [place] office.

## 2024-03-03 ENCOUNTER — Ambulatory Visit: Attending: Family Medicine | Admitting: Physical Therapy

## 2024-03-03 ENCOUNTER — Encounter: Payer: Self-pay | Admitting: Physical Therapy

## 2024-03-03 ENCOUNTER — Other Ambulatory Visit: Payer: Self-pay

## 2024-03-03 DIAGNOSIS — M549 Dorsalgia, unspecified: Secondary | ICD-10-CM | POA: Diagnosis not present

## 2024-03-03 DIAGNOSIS — M545 Low back pain, unspecified: Secondary | ICD-10-CM | POA: Diagnosis not present

## 2024-03-03 DIAGNOSIS — G8929 Other chronic pain: Secondary | ICD-10-CM | POA: Insufficient documentation

## 2024-03-03 DIAGNOSIS — R252 Cramp and spasm: Secondary | ICD-10-CM | POA: Diagnosis present

## 2024-03-03 DIAGNOSIS — M542 Cervicalgia: Secondary | ICD-10-CM | POA: Diagnosis present

## 2024-03-03 DIAGNOSIS — M5416 Radiculopathy, lumbar region: Secondary | ICD-10-CM | POA: Insufficient documentation

## 2024-03-03 NOTE — Therapy (Signed)
 OUTPATIENT PHYSICAL THERAPY CERVICO/THORACO/LUMBAR EVALUATION   Patient Name: Valerie [name] MRN: [id number] DOB:[birth year], 58 y.o., female Today's Date: 03/03/2024  END OF SESSION:  PT End of Session - 03/03/24 1102     Visit Number 1    Date for PT Re-Evaluation 04/28/24    Authorization Type [facility] [employer]    PT Start Time 1103    PT Stop Time 1141    PT Time Calculation (min) 38 min    Activity Tolerance Patient tolerated treatment well    Behavior During Therapy [facility] for tasks assessed/performed          Past Medical History:  Diagnosis Date   Allergy    cats   Anemia    NOS   Cataract    Female fertility problem    hx of treatment   GERD (gastroesophageal reflux disease)    HSV infection    labialis   RECTAL BLEEDING 07/10/2007   Qualifier: Diagnosis of  By: Charlett MD, Apolinar POUR 2008 colonoscopy [facility].    Past Surgical History:  Procedure Laterality Date   CYST REMOVAL HAND Right    DILITATION & CURRETTAGE/HYSTROSCOPY WITH VERSAPOINT RESECTION  08/27/2012   Procedure: DILATATION & CURETTAGE/HYSTEROSCOPY WITH VERSAPOINT RESECTION;  Surgeon: Marie-Lyne Lavoie, MD;  Location: [facility];  Service: Gynecology;;   EYE SURGERY  Lasik   LASIK  2009   mono vision   TRIGGER FINGER RELEASE Right 06/02/2017   Procedure: RELEASE TRIGGER FINGER/A-1 PULLEY RIGHT RING TRIGGER RELEASE;  Surgeon: Murrell Drivers, MD;  Location: [facility];  Service: Orthopedics;  Laterality: Right;   Patient Active Problem List   Diagnosis Date Noted   Genetic testing 03/01/2024   Well woman exam with routine gynecological exam 10/29/2023   Bicipital tendinitis of right shoulder 02/16/2020   Myofascial muscle pain 11/08/2019   Chronic bilateral thoracic back pain 11/08/2019   Pain of both shoulder joints 09/21/2015   Impingement syndrome of right shoulder 03/08/2015   Trigger point of right shoulder region 03/08/2015   Low back pain 03/16/2014   Wrist pain, right 02/09/2014    Nonallopathic lesion of cervical region 11/03/2013   Nonallopathic lesion of thoracic region 11/03/2013   Nonallopathic lesion-rib cage 11/03/2013   Nonallopathic lesion of lumbosacral region 11/03/2013   Nonallopathic lesion of sacral region 11/03/2013   Imbalance 10/27/2012   Neck pain 10/27/2012   Headache 07/15/2012   Abdominal cramps 02/25/2012   Abnormal stools 02/25/2012   Delayed menses 02/25/2012   Visit for preventive health examination 12/03/2011   Abdominal bloating 12/03/2011   DYSESTHESIA 09/20/2009   ARREST OF BONE DEVELOPMENT OR GROWTH 07/24/2007   DERMATOPHYTOSIS OF NAIL 07/10/2007   ALLERGIC RHINITIS 07/10/2007   GERD 07/10/2007   BLOOD IN STOOL 07/10/2007   HERPES LABIALIS, HX OF 07/10/2007    PCP: Charlett Apolinar POUR, MD   REFERRING PROVIDER: [place], Betty G, MD    REFERRING DIAG:  M54.9,G89.29 (ICD-10-CM) - Upper back pain, chronic  M54.50,G89.29 (ICD-10-CM) - Chronic right-sided low back pain, unspecified whether sciatica present    Rationale for Evaluation and Treatment: Rehabilitation  THERAPY DIAG:  Cervicalgia  Cramp and spasm  Radiculopathy, lumbar region  ONSET DATE: 3 weeks ago  SUBJECTIVE:  SUBJECTIVE STATEMENT: 3 weeks ago my MIL passed away and I was caring for her. My R arm hurt for the first week, but that is gone.  I have pain in my R shoulder and my R hip. Years ago I started with L shoulder pain and it is chronic. I have tightness in my suboccipital area and scalp sometimes.   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 Yes: NPRS scale: 5/10 up to 6-7/10 Pain location: Right shoulder Pain description: ache tight twisting Aggravating factors: stress, gardening, cleaning, carrying   Relieving factors: massage, heat, tylenol , ice  Are you having pain? Yes: NPRS scale: 2-3/10 Pain location: R hip in to R leg  Pain description: achy Aggravating factors: prolonged sitting, car 2 hours Relieving factors: standing  PRECAUTIONS: None  RED FLAGS: None   WEIGHT BEARING RESTRICTIONS: No  FALLS:  Has patient fallen in last 6 months? Yes. 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  DIAGNOSTIC FINDINGS:  2019 MRI IMPRESSION: 1. At L4-5 there is a mild broad-based disc bulge. Mild bilateral facet arthropathy and bilateral lateral recess narrowing. 2. At L5-S1 there is a broad-based disc bulge with a small central disc protrusion.  PATIENT SURVEYS:  NDI  13 / 50 = 26.0 % Quick dash 20.5 / 100 = 20.5 %  COGNITION: Overall cognitive status: Within functional limits for tasks assessed    MUSCLE LENGTH: Tight UT, pectoralis, cervical paraspinals  POSTURE: rounded shoulders and forward head  PALPATION: TTP in B UT, SO, paraspinals, pecs Spinal mobiliy decreased in mid thoracic   CERVICAL ROM:  rotation 40 R  30 L pain turning L into R upper arm, flexion full pain down into thoracic spin Lateral flex R 23   L 30 pain into L shoulder  UE ROM: Full B except R ABD 160 (full PROM)  LUMBAR ROM: Limited with R rotation 25% feels in R hip also with R SB, else ROM WNL  Negative cervical special tests    TREATMENT DATE:                                                                                                                               03/03/24 See pt ed and HEP    PATIENT EDUCATION:  Education details: PT eval  findings, anticipated POC, initial HEP, postural awareness, and role of TPDN   Person educated: Patient Education method: Explanation, Demonstration, Tactile cues, Verbal cues, and Handouts Education comprehension: verbalized understanding and returned demonstration  HOME EXERCISE PROGRAM: Access Code: [email or web address] URL: [email or web address] Date:  03/03/2024 Prepared by: Mliss  Exercises - Seated Cervical Retraction  - 2 x daily - 7 x weekly - 1 sets - 10 reps - 5 hold - Seated Cervical Rotation AROM  - 1 x daily - 7 x weekly - 1 sets - 10 reps - 5 hold - Seated Cervical Sidebending AROM  - 1 x daily - 7 x weekly - 1 sets - 10 reps - 5 hold - Seated Cervical Flexion AROM  - 1 x daily - 7 x weekly - 1 sets - 10 reps - 5 hold - Sidelying Thoracic Rotation with Open Book  - 1-2 x daily - 7 x weekly - 1 sets - 10 reps - Doorway Pec Stretch at 90 Degrees Abduction  - 2 x daily - 7 x weekly - 1 sets - 3 reps - 30 seconds hold  ASSESSMENT:  CLINICAL IMPRESSION: Patient is a 58 y.o. female who was seen today for physical therapy evaluation and treatment for upper back and low back pain. She reports her main issue is more upper back and neck. She has chronic R lumbar radiculpathy as well. She demonstrates limited cervical ROM, decreased flexibility in B UQ, decreased thoracic and cervical spinal mobility with pain and postural deficits. Pain is affecting most ADLs. She has some pain with R shoulder ABD, but otherwise B shoulder ROM is WNL. She does have ongoing radicular pain into the R hip and thigh as well. She will benefit from skilled PT to address these deficits.  .   OBJECTIVE IMPAIRMENTS: decreased activity tolerance, decreased ROM, decreased strength, increased muscle spasms, impaired flexibility, postural dysfunction, and pain.   ACTIVITY LIMITATIONS: carrying, lifting, sleeping, reach over head, and hygiene/grooming  PARTICIPATION LIMITATIONS: meal prep, cleaning, laundry,  driving, occupation, and yard work  PERSONAL FACTORS: Profession and 1 comorbidity: chronic shoulder and LBP are also affecting patient's functional outcome.   REHAB POTENTIAL: Excellent  CLINICAL DECISION MAKING: Evolving/moderate complexity  EVALUATION COMPLEXITY: Moderate   GOALS: Goals reviewed with patient? Yes  SHORT TERM GOALS: Target date: 03/31/2024   Patient will be independent with initial HEP.  Baseline:  Goal status: INITIAL 2.  Decreased pain in the neck and shoulders by 50% with ADLs  Baseline:  Goal status: INITIAL    LONG TERM GOALS: Target date: 04/28/2024   Patient will be independent with advanced/ongoing HEP to improve outcomes and carryover.  Baseline:  Goal status: INITIAL  2.  Patient will report 85% improvement in neck pain to improve QOL.  Baseline:  Goal status: INITIAL  3.  Patient will demonstrate full pain free cervical ROM for safety with driving.  Baseline:  Goal status:INITIAL  4.   Patient will report 3/50 on NDI to demonstrate improved functional ability.  Baseline: 13/50 Goal status: INITIAL    PLAN:  PT FREQUENCY: 2x/week  PT DURATION: 8 weeks  PLANNED INTERVENTIONS: [id number]- PT Re-evaluation, 97110-Therapeutic exercises, 97530- Therapeutic activity, 97112- Neuromuscular re-education, 97535- Self Care, [id number]- Manual therapy, [phone]- Aquatic Therapy, [phone]- Electrical stimulation (unattended), [phone]- Traction (mechanical), [phone] (1-2 muscles), [id number] (3+ muscles)- Dry Needling, Patient/Family education, Taping, Joint mobilization, Spinal manipulation, Spinal mobilization, Cryotherapy, and Moist heat.  PLAN FOR NEXT SESSION: Review and progress HEP,  manual/DN to B UT, LS, pecs, cspine, SO; cervical and throracic spinal mobs, cervical ROM, postural strengthening   Mliss Cummins, PT 03/03/24 3:25 PM

## 2024-03-03 NOTE — Patient Instructions (Signed)

## 2024-03-05 ENCOUNTER — Ambulatory Visit: Payer: Self-pay | Admitting: Genetic Counselor

## 2024-03-05 DIAGNOSIS — Z1379 Encounter for other screening for genetic and chromosomal anomalies: Secondary | ICD-10-CM

## 2024-03-05 NOTE — Progress Notes (Signed)
 HPI:   Valerie [name] was previously seen in the [facility] due to a family history of cancer and concerns regarding a hereditary predisposition to cancer. Please refer to our prior cancer genetics clinic note for more information regarding our discussion, assessment and recommendations, at the time. Valerie [name] recent genetic test results were disclosed to her, as were recommendations warranted by these results. These results and recommendations are discussed in more detail below.  CANCER HISTORY:  Oncology History   No history exists.    FAMILY HISTORY:  We obtained a detailed, 4-generation family history.  Significant diagnoses are listed below:      Family History  Problem Relation Age of Onset   Colon polyps Brother          elev TG   Ovarian cancer Maternal Aunt 75 - 69   Colon cancer Cousin 34 - 1        maternal first cousin   Breast cancer Other 42 - 37        maternal first cousin once removed       Valerie [name] is unaware of previous family history of genetic testing for hereditary cancer risks. There is no reported Ashkenazi Jewish ancestry.   GENETIC TEST RESULTS:  The [facility] CancerNext-Expanded Panel found no pathogenic mutations.   The CancerNext-Expanded gene panel offered by [facility] and includes sequencing, rearrangement, and RNA analysis for the following 77 genes: AIP, ALK, APC, ATM, AXIN2, BAP1, BARD1, BMPR1A, BRCA1, BRCA2, BRIP1, CDC73, CDH1, CDK4, CDKN1B, CDKN2A, CEBPA, CHEK2, CTNNA1, DDX41, DICER1, ETV6, FH, FLCN, GATA2, LZTR1, MAX, MBD4, MEN1, MET, MLH1, MSH2, MSH3, MSH6, MUTYH, NF1, NF2, NTHL1, PALB2, PHOX2B, PMS2, POT1, PRKAR1A, PTCH1, PTEN, RAD51C, RAD51D, RB1, RET, RPS20, RUNX1, SDHA, SDHAF2, SDHB, SDHC, SDHD, SMAD4, SMARCA4, SMARCB1, SMARCE1, STK11, SUFU, TMEM127, TP53, TSC1, TSC2, VHL, and WT1 (sequencing and deletion/duplication); EGFR, HOXB13, KIT, MITF, PDGFRA, POLD1, and POLE (sequencing only); EPCAM and GREM1 (deletion/duplication  only).   The test report has been scanned into EPIC and is located under the Molecular Pathology section of the Results Review tab.  A portion of the result report is included below for reference. Genetic testing reported out on 02/29/2024.       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  At this time, it is unknown if this variant is associated with an increased risk for cancer or if it is benign, but most uncertain variants are reclassified to benign. It should not be used to make medical management decisions. With time, we suspect the laboratory will determine the significance of this variant, if any. If the laboratory reclassifies this variant, we will attempt to contact Valerie [name] to discuss it further.   Even though a pathogenic variant was not identified, possible explanations for the cancer in the family may include: There may be no hereditary risk for cancer in the family. The cancers in her family may be due to other genetic or environmental factors. There may be a gene mutation in one of these genes that current testing methods cannot detect, but that chance is small. There could be another gene that has not yet been discovered, or that we have not yet tested, that is responsible for the cancer diagnoses in the family.  It is also possible there is a hereditary cause for the cancer in the family that Valerie [name] did not inherit.  Therefore, it is important to remain in touch with cancer genetics  in the future so that we can continue to offer Valerie [name] the most up to date genetic testing.   ADDITIONAL GENETIC TESTING:  We discussed with Valerie [name] that her genetic testing was fairly extensive.  If there are genes identified to increase cancer risk that can be analyzed in the future, we would be happy to discuss and coordinate this testing at that time.   CANCER SCREENING RECOMMENDATIONS:  Valerie [name] test result is considered negative  (normal).  This means that we have not identified a hereditary cause for her family history of cancer at this time.   An individual's cancer risk and medical management are not determined by genetic test results alone. Overall cancer risk assessment incorporates additional factors, including personal medical history, family history, and any available genetic information that may result in a personalized plan for cancer prevention and surveillance. Therefore, it is recommended she continue to follow the cancer management and screening guidelines provided by her primary healthcare provider.  RECOMMENDATIONS FOR FAMILY MEMBERS:   Other members of the family may still carry a pathogenic variant in one of these genes that Valerie [name] did not inherit. Based on the family history, we recommend her mother have genetic counseling and testing.  We do not recommend familial testing for the APC variant of uncertain significance (VUS).  FOLLOW-UP:  Cancer genetics is a rapidly advancing field and it is possible that new genetic tests will be appropriate for her and/or her family members in the future. We encouraged her to remain in contact with cancer genetics on an annual basis so we can update her personal and family histories and let her know of advances in cancer genetics that may benefit this family.   Our contact number was provided. Valerie [name] questions were answered to her satisfaction, and she knows she is welcome to call us  at anytime with additional questions or concerns.   Valerie Ellender, MS, [facility] Genetic Counselor [email or web address] (P) [phone]

## 2024-03-09 ENCOUNTER — Encounter: Payer: Self-pay | Admitting: Rehabilitative and Restorative Service Providers"

## 2024-03-09 ENCOUNTER — Ambulatory Visit: Admitting: Rehabilitative and Restorative Service Providers"

## 2024-03-09 DIAGNOSIS — M542 Cervicalgia: Secondary | ICD-10-CM | POA: Diagnosis not present

## 2024-03-09 DIAGNOSIS — M5416 Radiculopathy, lumbar region: Secondary | ICD-10-CM

## 2024-03-09 DIAGNOSIS — R252 Cramp and spasm: Secondary | ICD-10-CM

## 2024-03-09 NOTE — Therapy (Signed)
 OUTPATIENT PHYSICAL THERAPY TREATMENT NOTE   Patient Name: Valerie [name] MRN: [id number] DOB:[birth year], 58 y.o., female Today's Date: 03/09/2024  END OF SESSION:  PT End of Session - 03/09/24 0743     Visit Number 2    Date for PT Re-Evaluation 04/28/24    Authorization Type [facility] [employer]    PT Start Time [phone]    PT Stop Time 0805    PT Time Calculation (min) 26 min    Activity Tolerance Patient tolerated treatment well    Behavior During Therapy [facility] for tasks assessed/performed          Past Medical History:  Diagnosis Date   Allergy    cats   Anemia    NOS   Cataract    Female fertility problem    hx of treatment   GERD (gastroesophageal reflux disease)    HSV infection    labialis   RECTAL BLEEDING 07/10/2007   Qualifier: Diagnosis of  By: Charlett MD, Apolinar POUR 2008 colonoscopy [place].    Past Surgical History:  Procedure Laterality Date   CYST REMOVAL HAND Right    DILITATION & CURRETTAGE/HYSTROSCOPY WITH VERSAPOINT RESECTION  08/27/2012   Procedure: DILATATION & CURETTAGE/HYSTEROSCOPY WITH VERSAPOINT RESECTION;  Surgeon: Marie-Lyne Lavoie, MD;  Location: [facility];  Service: Gynecology;;   EYE SURGERY  Lasik   LASIK  2009   mono vision   TRIGGER FINGER RELEASE Right 06/02/2017   Procedure: RELEASE TRIGGER FINGER/A-1 PULLEY RIGHT RING TRIGGER RELEASE;  Surgeon: Murrell Drivers, MD;  Location: [facility];  Service: Orthopedics;  Laterality: Right;   Patient Active Problem List   Diagnosis Date Noted   Genetic testing 03/01/2024   Well woman exam with routine gynecological exam 10/29/2023   Bicipital tendinitis of right shoulder 02/16/2020   Myofascial muscle pain 11/08/2019   Chronic bilateral thoracic back pain 11/08/2019   Pain of both shoulder joints 09/21/2015   Impingement syndrome of right shoulder 03/08/2015   Trigger point of right shoulder region 03/08/2015   Low back pain 03/16/2014   Wrist pain, right 02/09/2014   Nonallopathic lesion  of cervical region 11/03/2013   Nonallopathic lesion of thoracic region 11/03/2013   Nonallopathic lesion-rib cage 11/03/2013   Nonallopathic lesion of lumbosacral region 11/03/2013   Nonallopathic lesion of sacral region 11/03/2013   Imbalance 10/27/2012   Neck pain 10/27/2012   Headache 07/15/2012   Abdominal cramps 02/25/2012   Abnormal stools 02/25/2012   Delayed menses 02/25/2012   Visit for preventive health examination 12/03/2011   Abdominal bloating 12/03/2011   DYSESTHESIA 09/20/2009   ARREST OF BONE DEVELOPMENT OR GROWTH 07/24/2007   DERMATOPHYTOSIS OF NAIL 07/10/2007   ALLERGIC RHINITIS 07/10/2007   GERD 07/10/2007   BLOOD IN STOOL 07/10/2007   HERPES LABIALIS, HX OF 07/10/2007    PCP: Charlett Apolinar POUR, MD   REFERRING PROVIDER: [place], Betty G, MD    REFERRING DIAG:  M54.9,G89.29 (ICD-10-CM) - Upper back pain, chronic  M54.50,G89.29 (ICD-10-CM) - Chronic right-sided low back pain, unspecified whether sciatica present    Rationale for Evaluation and Treatment: Rehabilitation  THERAPY DIAG:  Cervicalgia  Cramp and spasm  Radiculopathy, lumbar region  ONSET DATE: 3 weeks ago  SUBJECTIVE:  SUBJECTIVE STATEMENT: Patient states that she did some yard work and was down on her hands and knees did note that the stretching in that position helped.  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 Yes: NPRS scale: 4-5/10 Pain location: Right shoulder Pain description: ache tight twisting Aggravating factors: stress, gardening, cleaning, carrying  Relieving factors: massage, heat, tylenol , ice  Are you having pain? Yes: NPRS scale: 5/10 Pain location: low back Pain description: achy Aggravating factors:  prolonged sitting, car 2 hours Relieving factors: standing  PRECAUTIONS: None  RED FLAGS: None   WEIGHT BEARING RESTRICTIONS: No  FALLS:  Has patient fallen in last 6 months? Yes. 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  DIAGNOSTIC FINDINGS:  2019 MRI IMPRESSION: 1. At L4-5 there is a mild broad-based disc bulge. Mild bilateral facet arthropathy and bilateral lateral recess narrowing. 2. At L5-S1 there is a broad-based disc bulge with a small central disc protrusion.  PATIENT SURVEYS:  Eval: NDI  13 / 50 = 26.0 % Quick dash 20.5 / 100 = 20.5 %  COGNITION: Overall cognitive status: Within functional limits for tasks assessed    MUSCLE LENGTH: Tight UT, pectoralis, cervical paraspinals  POSTURE: rounded shoulders and forward head  PALPATION: TTP in B UT, SO, paraspinals, pecs Spinal mobiliy decreased in mid thoracic   CERVICAL ROM:  rotation 40 R  30 L pain turning L into R upper arm, flexion full pain down into thoracic spin Lateral flex R 23   L 30 pain into L shoulder  UE ROM: Full B except R ABD 160 (full PROM)  LUMBAR ROM: Limited with R rotation 25% feels in R hip also with R SB, else ROM WNL  Negative cervical special tests    TREATMENT DATE:                                                                                                                               03/09/2024: Nustep level 4 x3 min with PT present to discuss status Standing hamstring stretch on stairs 2x20 sec bilat Seated piriformis stretch 2x20 sec bilat Seated 3 way blue pball rollout x3 each direction Quadruped rocking x10 Quadruped tail  wag x10 Quadruped transversus abdominus contraction x10 Quadruped cat/cow x10   03/03/24 See pt ed and HEP    PATIENT EDUCATION:  Education details: PT eval findings, anticipated POC, initial HEP, postural awareness, and role of TPDN   Person educated: Patient Education method: Explanation, Demonstration, Tactile cues, Verbal cues, and Handouts Education comprehension: verbalized understanding and returned demonstration  HOME EXERCISE PROGRAM: Access Code: [email or web address] URL: [email or web address] Date: 03/09/2024 Prepared by: Jarrell Laming  Exercises - Seated Cervical Retraction  - 2 x daily - 7 x weekly - 1 sets - 10 reps - 5 hold - Seated Cervical Rotation AROM  - 1 x daily - 7 x weekly - 1 sets - 10 reps - 5 hold - Seated Cervical Sidebending AROM  - 1 x daily - 7 x weekly - 1 sets - 10 reps - 5 hold - Seated Cervical Flexion AROM  - 1 x daily - 7 x weekly - 1 sets - 10 reps - 5 hold - Sidelying Thoracic Rotation with Open Book  - 1-2 x daily - 7 x weekly - 1 sets - 10 reps - Doorway Pec Stretch at 90 Degrees Abduction  - 2 x daily - 7 x weekly - 1 sets - 3 reps - 30 seconds hold - Seated Hamstring Stretch  - 1 x daily - 7 x weekly - 2 reps - 20 sec hold - Seated Piriformis Stretch with Trunk Bend  - 1 x daily - 7 x weekly - 2 reps - 20 sec hold - Seated Thoracic Flexion and Rotation with Swiss Ball  - 1 x daily - 7 x weekly - 10 reps - Cat Cow  - 1 x daily - 7 x weekly - 2 sets - 10 reps - Quadruped Rocking Backward  - 1 x daily - 7 x weekly - 2 sets - 10 reps - Quadruped Transversus Abdominis Bracing  - 1 x daily - 7 x weekly - 2 sets - 10 reps  ASSESSMENT:  CLINICAL IMPRESSION: Ms Krikorian presents to skilled PT stating that she was able to do some yard work this weekend and she did note that when she was on her hands and knees, she felt a good stretch for her back.  Patient states that her back is bothering her more today than her shoulder/cervical region.  Patient  able to perform more exercises today to focus on lower body stretching and stability.  Patient reported that the seated 3 way physioball stretch felt good and added this and quadruped exercises to her HEP.  Patient with great progress during session and great return of exercises.  Patient continues to require skilled PT to progress towards goal related activities.  OBJECTIVE IMPAIRMENTS: decreased activity tolerance, decreased ROM, decreased strength, increased muscle spasms, impaired flexibility, postural dysfunction, and pain.   ACTIVITY LIMITATIONS: carrying, lifting, sleeping, reach over head, and hygiene/grooming  PARTICIPATION LIMITATIONS: meal prep, cleaning, laundry, driving, occupation, and yard work  PERSONAL FACTORS: Profession and 1 comorbidity: chronic shoulder and LBP are also affecting patient's functional outcome.   REHAB POTENTIAL: Excellent  CLINICAL DECISION MAKING: Evolving/moderate complexity  EVALUATION COMPLEXITY: Moderate   GOALS: Goals reviewed with patient? Yes  SHORT TERM GOALS: Target date: 03/31/2024   Patient will be independent with initial HEP.  Baseline:  Goal status: Met on 03/09/24  2.  Decreased pain in the neck and shoulders by 50% with ADLs  Baseline:  Goal status: INITIAL    LONG TERM GOALS: Target date: 04/28/2024   Patient will be independent with advanced/ongoing HEP to improve outcomes and carryover.  Baseline:  Goal status: Ongoing  2.  Patient will report 85% improvement in neck pain to improve QOL.  Baseline:  Goal status: INITIAL  3.  Patient will demonstrate full pain free cervical ROM for safety with driving.  Baseline:  Goal status:INITIAL  4.   Patient  will report 3/50 on NDI to demonstrate improved functional ability.  Baseline: 13/50 Goal status: INITIAL    PLAN:  PT FREQUENCY: 2x/week  PT DURATION: 8 weeks  PLANNED INTERVENTIONS: 97164- PT Re-evaluation, 97110-Therapeutic exercises, 97530- Therapeutic  activity, 97112- Neuromuscular re-education, 97535- Self Care, [id number]- Manual therapy, [phone]- Aquatic Therapy, [phone]- Electrical stimulation (unattended), [phone]- Traction (mechanical), [phone] (1-2 muscles), [phone] (3+ muscles)- Dry Needling, Patient/Family education, Taping, Joint mobilization, Spinal manipulation, Spinal mobilization, Cryotherapy, and Moist heat.  PLAN FOR NEXT SESSION: Review and progress HEP,  manual/DN to B UT, LS, pecs, cspine, SO; cervical and throracic spinal mobs, cervical ROM, postural strengthening    Jarrell Laming, PT, DPT 03/09/24, 8:24 AM  [facility] [address] [place], [place] [address] Phone # [phone] Fax [phone]

## 2024-03-11 ENCOUNTER — Ambulatory Visit: Admitting: Rehabilitative and Restorative Service Providers"

## 2024-03-11 ENCOUNTER — Ambulatory Visit (INDEPENDENT_AMBULATORY_CARE_PROVIDER_SITE_OTHER)

## 2024-03-11 ENCOUNTER — Encounter: Payer: Self-pay | Admitting: Rehabilitative and Restorative Service Providers"

## 2024-03-11 DIAGNOSIS — J309 Allergic rhinitis, unspecified: Secondary | ICD-10-CM | POA: Diagnosis not present

## 2024-03-11 DIAGNOSIS — R252 Cramp and spasm: Secondary | ICD-10-CM

## 2024-03-11 DIAGNOSIS — M542 Cervicalgia: Secondary | ICD-10-CM

## 2024-03-11 DIAGNOSIS — M5416 Radiculopathy, lumbar region: Secondary | ICD-10-CM

## 2024-03-11 NOTE — Therapy (Signed)
 OUTPATIENT PHYSICAL THERAPY TREATMENT NOTE   Patient Name: Valerie [name] MRN: [id number] DOB:11/17/1965, 58 y.o., female Today's Date: 03/11/2024  END OF SESSION:  PT End of Session - 03/11/24 0736     Visit Number 3    Date for PT Re-Evaluation 04/28/24    Authorization Type [facility] [employer]    PT Start Time [phone]    PT Stop Time 0802    PT Time Calculation (min) 28 min    Activity Tolerance Patient tolerated treatment well    Behavior During Therapy [facility] for tasks assessed/performed          Past Medical History:  Diagnosis Date   Allergy    cats   Anemia    NOS   Cataract    Female fertility problem    hx of treatment   GERD (gastroesophageal reflux disease)    HSV infection    labialis   RECTAL BLEEDING 07/10/2007   Qualifier: Diagnosis of  By: Charlett MD, Apolinar POUR 2008 colonoscopy [facility].    Past Surgical History:  Procedure Laterality Date   CYST REMOVAL HAND Right    DILITATION & CURRETTAGE/HYSTROSCOPY WITH VERSAPOINT RESECTION  08/27/2012   Procedure: DILATATION & CURETTAGE/HYSTEROSCOPY WITH VERSAPOINT RESECTION;  Surgeon: Marie-Lyne Lavoie, MD;  Location: [facility];  Service: Gynecology;;   EYE SURGERY  Lasik   LASIK  2009   mono vision   TRIGGER FINGER RELEASE Right 06/02/2017   Procedure: RELEASE TRIGGER FINGER/A-1 PULLEY RIGHT RING TRIGGER RELEASE;  Surgeon: Murrell Drivers, MD;  Location: [facility];  Service: Orthopedics;  Laterality: Right;   Patient Active Problem List   Diagnosis Date Noted   Genetic testing 03/01/2024   Well woman exam with routine gynecological exam 10/29/2023   Bicipital tendinitis of right shoulder 02/16/2020   Myofascial muscle pain 11/08/2019   Chronic bilateral thoracic back pain 11/08/2019   Pain of both shoulder joints 09/21/2015   Impingement syndrome of right shoulder 03/08/2015   Trigger point of right shoulder region 03/08/2015   Low back pain 03/16/2014   Wrist pain, right 02/09/2014   Nonallopathic lesion  of cervical region 11/03/2013   Nonallopathic lesion of thoracic region 11/03/2013   Nonallopathic lesion-rib cage 11/03/2013   Nonallopathic lesion of lumbosacral region 11/03/2013   Nonallopathic lesion of sacral region 11/03/2013   Imbalance 10/27/2012   Neck pain 10/27/2012   Headache 07/15/2012   Abdominal cramps 02/25/2012   Abnormal stools 02/25/2012   Delayed menses 02/25/2012   Visit for preventive health examination 12/03/2011   Abdominal bloating 12/03/2011   DYSESTHESIA 09/20/2009   ARREST OF BONE DEVELOPMENT OR GROWTH 07/24/2007   DERMATOPHYTOSIS OF NAIL 07/10/2007   ALLERGIC RHINITIS 07/10/2007   GERD 07/10/2007   BLOOD IN STOOL 07/10/2007   HERPES LABIALIS, HX OF 07/10/2007    PCP: Charlett Apolinar POUR, MD   REFERRING PROVIDER: [place], Betty G, MD    REFERRING DIAG:  M54.9,G89.29 (ICD-10-CM) - Upper back pain, chronic  M54.50,G89.29 (ICD-10-CM) - Chronic right-sided low back pain, unspecified whether sciatica present    Rationale for Evaluation and Treatment: Rehabilitation  THERAPY DIAG:  Cervicalgia  Cramp and spasm  Radiculopathy, lumbar region  ONSET DATE: 3 weeks ago  SUBJECTIVE:  SUBJECTIVE STATEMENT: Patient states that her back is feeling better, but she is still having right shoulder pain.  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 Yes: NPRS scale: 5/10 Pain location: Right shoulder Pain description: ache tight twisting Aggravating factors: stress, gardening, cleaning, carrying  Relieving factors: massage, heat, tylenol , ice  Are you having pain? Yes: NPRS scale: 4/10 Pain location: low back Pain description: achy Aggravating factors: prolonged sitting, car 2 hours Relieving factors:  standing  PRECAUTIONS: None  RED FLAGS: None   WEIGHT BEARING RESTRICTIONS: No  FALLS:  Has patient fallen in last 6 months? Yes. 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  DIAGNOSTIC FINDINGS:  2019 MRI IMPRESSION: 1. At L4-5 there is a mild broad-based disc bulge. Mild bilateral facet arthropathy and bilateral lateral recess narrowing. 2. At L5-S1 there is a broad-based disc bulge with a small central disc protrusion.  PATIENT SURVEYS:  Eval: NDI  13 / 50 = 26.0 % Quick dash 20.5 / 100 = 20.5 %  COGNITION: Overall cognitive status: Within functional limits for tasks assessed    MUSCLE LENGTH: Tight UT, pectoralis, cervical paraspinals  POSTURE: rounded shoulders and forward head  PALPATION: TTP in B UT, SO, paraspinals, pecs Spinal mobiliy decreased in mid thoracic   CERVICAL ROM:  rotation 40 R  30 L pain turning L into R upper arm, flexion full pain down into thoracic spin Lateral flex R 23   L 30 pain into L shoulder  UE ROM: Full B except R ABD 160 (full PROM)  LUMBAR ROM: Limited with R rotation 25% feels in R hip also with R SB, else ROM WNL  Negative cervical special tests    TREATMENT DATE:                                                                                                                               03/11/2024: Nustep level 5 x5 min with PT present to discuss status Seated 3 way blue pball rollout x5 each direction Quadruped rocking x10 Quadruped cat/cow x10 Standing 3 way scapular stabilization with blue loop x5 bilat Standing lower trap lift off 2x10 Standing 3 way shoulder elevation with  2# dumbbells x10 each   03/09/2024: Nustep level 4 x3 min with PT present to discuss status Standing hamstring stretch on stairs 2x20 sec bilat Seated piriformis stretch 2x20 sec bilat Seated 3 way blue pball rollout x3 each direction Quadruped rocking x10 Quadruped tail wag x10 Quadruped transversus abdominus contraction x10 Quadruped cat/cow x10    PATIENT EDUCATION:  Education details: PT eval findings, anticipated POC, initial HEP, postural awareness, and role of TPDN   Person educated: Patient Education method: Explanation, Demonstration, Tactile cues, Verbal cues, and Handouts Education comprehension: verbalized understanding and returned demonstration  HOME EXERCISE PROGRAM: Access Code: [email or web address] URL: [email or web address] Date: 03/09/2024 Prepared by: Jarrell Laming  Exercises - Seated Cervical Retraction  - 2 x daily - 7 x weekly - 1 sets - 10 reps - 5 hold - Seated Cervical Rotation AROM  - 1 x daily - 7 x weekly - 1 sets - 10 reps - 5 hold - Seated Cervical Sidebending AROM  - 1 x daily - 7 x weekly - 1 sets - 10 reps - 5 hold - Seated Cervical Flexion AROM  - 1 x daily - 7 x weekly - 1 sets - 10 reps - 5 hold - Sidelying Thoracic Rotation with Open Book  - 1-2 x daily - 7 x weekly - 1 sets - 10 reps - Doorway Pec Stretch at 90 Degrees Abduction  - 2 x daily - 7 x weekly - 1 sets - 3 reps - 30 seconds hold - Seated Hamstring Stretch  - 1 x daily - 7 x weekly - 2 reps - 20 sec hold - Seated Piriformis Stretch with Trunk Bend  - 1 x daily - 7 x weekly - 2 reps - 20 sec hold - Seated Thoracic Flexion and Rotation with Swiss Ball  - 1 x daily - 7 x weekly - 10 reps - Cat Cow  - 1 x daily - 7 x weekly - 2 sets - 10 reps - Quadruped Rocking Backward  - 1 x daily - 7 x weekly - 2 sets - 10 reps - Quadruped Transversus Abdominis Bracing  - 1 x daily - 7 x weekly - 2 sets - 10 reps  ASSESSMENT:  CLINICAL IMPRESSION: Ms Kettlewell presents to skilled PT stating  that her back is feeling better, but she is having some pain in her shoulder.  Patient was able to demonstrate good understanding of new quadruped exercises added last session.  Able to add in scapular stabilization exercises during session today.  Patient did have some discomfort with shoulder horizontal abduction in standing with reporting feeling the muscles working.  Patient continues to require skilled PT to progress towards goal related activities.   OBJECTIVE IMPAIRMENTS: decreased activity tolerance, decreased ROM, decreased strength, increased muscle spasms, impaired flexibility, postural dysfunction, and pain.   ACTIVITY LIMITATIONS: carrying, lifting, sleeping, reach over head, and hygiene/grooming  PARTICIPATION LIMITATIONS: meal prep, cleaning, laundry, driving, occupation, and yard work  PERSONAL FACTORS: Profession and 1 comorbidity: chronic shoulder and LBP are also affecting patient's functional outcome.   REHAB POTENTIAL: Excellent  CLINICAL DECISION MAKING: Evolving/moderate complexity  EVALUATION COMPLEXITY: Moderate   GOALS: Goals reviewed with patient? Yes  SHORT TERM GOALS: Target date: 03/31/2024   Patient will be independent with initial HEP.  Baseline:  Goal status: Met on 03/09/24  2.  Decreased pain in the neck and shoulders by 50% with ADLs  Baseline:  Goal status: Ongoing    LONG TERM GOALS: Target date: 04/28/2024   Patient will be independent with advanced/ongoing HEP to improve outcomes and carryover.  Baseline:  Goal status: Ongoing  2.  Patient will report 85% improvement in neck pain to improve QOL.  Baseline:  Goal status: INITIAL  3.  Patient will demonstrate full pain free cervical ROM for safety with driving.  Baseline:  Goal status:INITIAL  4.  Patient will report 3/50 on NDI to demonstrate improved functional ability.  Baseline: 13/50 Goal status: INITIAL    PLAN:  PT FREQUENCY: 2x/week  PT DURATION: 8 weeks  PLANNED  INTERVENTIONS: 97164- PT Re-evaluation, 97110-Therapeutic exercises, 97530- Therapeutic activity, 97112- Neuromuscular re-education, 97535- Self Care, 02859- Manual therapy, [phone]- Aquatic Therapy, [phone]- Electrical stimulation (unattended), [phone]- Traction (mechanical), [phone] (1-2 muscles), [phone] (3+ muscles)- Dry Needling, Patient/Family education, Taping, Joint mobilization, Spinal manipulation, Spinal mobilization, Cryotherapy, and Moist heat.  PLAN FOR NEXT SESSION: Review and progress HEP,  manual/DN to B UT, LS, pecs, cspine, SO; cervical and throracic spinal mobs, cervical ROM, postural strengthening    Jarrell Laming, PT, DPT 03/11/24, 8:27 AM  [facility] [address], [address] [place], [place] [address] Phone # [phone] Fax [phone]

## 2024-03-12 ENCOUNTER — Encounter: Payer: Self-pay | Admitting: Genetic Counselor

## 2024-03-15 ENCOUNTER — Ambulatory Visit: Payer: Self-pay

## 2024-03-15 DIAGNOSIS — M542 Cervicalgia: Secondary | ICD-10-CM

## 2024-03-15 DIAGNOSIS — R252 Cramp and spasm: Secondary | ICD-10-CM

## 2024-03-15 DIAGNOSIS — M5416 Radiculopathy, lumbar region: Secondary | ICD-10-CM

## 2024-03-15 NOTE — Patient Instructions (Signed)

## 2024-03-15 NOTE — Therapy (Addendum)
 OUTPATIENT PHYSICAL THERAPY TREATMENT NOTE   Patient Name: Valerie [name] MRN: [id number] DOB:1966-08-10, 58 y.o., female Today's Date: 03/15/2024  END OF SESSION:  PT End of Session - 03/15/24 1708     Visit Number 4    Date for PT Re-Evaluation 04/28/24    Authorization Type [facility] [employer]    PT Start Time 1618    PT Stop Time 1709    PT Time Calculation (min) 51 min    Activity Tolerance Patient tolerated treatment well    Behavior During Therapy [facility] for tasks assessed/performed           Past Medical History:  Diagnosis Date   Allergy    cats   Anemia    NOS   Cataract    Female fertility problem    hx of treatment   GERD (gastroesophageal reflux disease)    HSV infection    labialis   RECTAL BLEEDING 07/10/2007   Qualifier: Diagnosis of  By: Charlett MD, Apolinar POUR 2008 colonoscopy [facility].    Past Surgical History:  Procedure Laterality Date   CYST REMOVAL HAND Right    DILITATION & CURRETTAGE/HYSTROSCOPY WITH VERSAPOINT RESECTION  08/27/2012   Procedure: DILATATION & CURETTAGE/HYSTEROSCOPY WITH VERSAPOINT RESECTION;  Surgeon: Marie-Lyne Lavoie, MD;  Location: [facility];  Service: Gynecology;;   EYE SURGERY  Lasik   LASIK  2009   mono vision   TRIGGER FINGER RELEASE Right 06/02/2017   Procedure: RELEASE TRIGGER FINGER/A-1 PULLEY RIGHT RING TRIGGER RELEASE;  Surgeon: Murrell Drivers, MD;  Location: [facility];  Service: Orthopedics;  Laterality: Right;   Patient Active Problem List   Diagnosis Date Noted   Genetic testing 03/01/2024   Well woman exam with routine gynecological exam 10/29/2023   Bicipital tendinitis of right shoulder 02/16/2020   Myofascial muscle pain 11/08/2019   Chronic bilateral thoracic back pain 11/08/2019   Pain of both shoulder joints 09/21/2015   Impingement syndrome of right shoulder 03/08/2015   Trigger point of right shoulder region 03/08/2015   Low back pain 03/16/2014   Wrist pain, right 02/09/2014   Nonallopathic  lesion of cervical region 11/03/2013   Nonallopathic lesion of thoracic region 11/03/2013   Nonallopathic lesion-rib cage 11/03/2013   Nonallopathic lesion of lumbosacral region 11/03/2013   Nonallopathic lesion of sacral region 11/03/2013   Imbalance 10/27/2012   Neck pain 10/27/2012   Headache 07/15/2012   Abdominal cramps 02/25/2012   Abnormal stools 02/25/2012   Delayed menses 02/25/2012   Visit for preventive health examination 12/03/2011   Abdominal bloating 12/03/2011   DYSESTHESIA 09/20/2009   ARREST OF BONE DEVELOPMENT OR GROWTH 07/24/2007   DERMATOPHYTOSIS OF NAIL 07/10/2007   ALLERGIC RHINITIS 07/10/2007   GERD 07/10/2007   BLOOD IN STOOL 07/10/2007   HERPES LABIALIS, HX OF 07/10/2007    PCP: Charlett Apolinar POUR, MD   REFERRING PROVIDER: Charlett Apolinar POUR, MD    REFERRING DIAG:  M54.9,G89.29 (ICD-10-CM) - Upper back pain, chronic  M54.50,G89.29 (ICD-10-CM) - Chronic right-sided low back pain, unspecified whether sciatica present    Rationale for Evaluation and Treatment: Rehabilitation  THERAPY DIAG:  Cervicalgia  Cramp and spasm  Radiculopathy, lumbar region  ONSET DATE: 3 weeks ago  SUBJECTIVE:  SUBJECTIVE STATEMENT: I think I want to do some dry needling.  I am sore and stiff on the Rt side of my upper back.    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 Yes: NPRS scale: 5/10 Pain location: Rt thoracic Pain description: ache tight twisting Aggravating factors: stress, gardening, cleaning, carrying  Relieving factors: massage, heat, tylenol , ice  Are you having pain? Yes: NPRS scale: 4/10 Pain location: low back Pain description: achy Aggravating factors: prolonged sitting, car 2  hours Relieving factors: standing  PRECAUTIONS: None  RED FLAGS: None   WEIGHT BEARING RESTRICTIONS: No  FALLS:  Has patient fallen in last 6 months? Yes. 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  DIAGNOSTIC FINDINGS:  2019 MRI IMPRESSION: 1. At L4-5 there is a mild broad-based disc bulge. Mild bilateral facet arthropathy and bilateral lateral recess narrowing. 2. At L5-S1 there is a broad-based disc bulge with a small central disc protrusion.  PATIENT SURVEYS:  Eval: NDI  13 / 50 = 26.0 % Quick dash 20.5 / 100 = 20.5 %  COGNITION: Overall cognitive status: Within functional limits for tasks assessed    MUSCLE LENGTH: Tight UT, pectoralis, cervical paraspinals  POSTURE: rounded shoulders and forward head  PALPATION: TTP in B UT, SO, paraspinals, pecs Spinal mobiliy decreased in mid thoracic   CERVICAL ROM:  rotation 40 R  30 L pain turning L into R upper arm, flexion full pain down into thoracic spin Lateral flex R 23   L 30 pain into L shoulder  UE ROM: Full B except R ABD 160 (full PROM)  LUMBAR ROM: Limited with R rotation 25% feels in R hip also with R SB, else ROM WNL  Negative cervical special tests    TREATMENT DATE:                                                                                                                                03/15/2024: Nustep level 5 x6 min with PT present to discuss status Seated 3 way blue pball rollout x5 each direction Standing open book stretch at wall: x10 each  Standing 3 way scapular stabilization with blue loop x5 bilat Standing 3 way shoulder elevation with 2#  dumbbells x10 each Trigger Point Dry Needling  Initial Treatment: Pt instructed on Dry Needling rational, procedures, and possible side effects. Pt instructed to expect mild to moderate muscle soreness later in the day and/or into the next day.  Pt instructed in methods to reduce muscle soreness. Pt instructed to continue prescribed  HEP. Because Dry Needling was performed over or adjacent to a lung field, pt was educated on S/S of pneumothorax and to seek immediate medical attention should they occur.  Patient was educated on signs and symptoms of infection and other risk factors and advised to seek medical attention should they occur.  Patient verbalized understanding of these instructions and education.   Patient Verbal Consent Given: Yes Education Handout Provided: Yes Muscles Treated: bil cervical multifidi, thoracic ,multifidi, Rt rhomboids, upper trap and subscapularis Electrical Stimulation Performed: No Treatment Response/Outcome: Utilized skilled palpation to identify bony landmarks and trigger points.  Able to illicit twitch response and muscle elongation.  Soft tissue mobilization to muscles needled following DN to further promote tissue elongation and decreased pain.      03/11/2024: Nustep level 5 x5 min with PT present to discuss status Seated 3 way blue pball rollout x5 each direction Quadruped rocking x10 Quadruped cat/cow x10 Standing 3 way scapular stabilization with blue loop x5 bilat Standing lower trap lift off 2x10 Standing 3 way shoulder elevation with 2# dumbbells x10 each   03/09/2024: Nustep level 4 x3 min with PT present to discuss status Standing hamstring stretch on stairs 2x20 sec bilat Seated piriformis stretch 2x20 sec bilat Seated 3 way blue pball rollout x3 each direction Quadruped rocking x10 Quadruped tail wag x10 Quadruped transversus abdominus contraction x10 Quadruped cat/cow x10    PATIENT EDUCATION:  Education details: PT eval findings,  anticipated POC, initial HEP, postural awareness, and role of TPDN   Person educated: Patient Education method: Explanation, Demonstration, Tactile cues, Verbal cues, and Handouts Education comprehension: verbalized understanding and returned demonstration  HOME EXERCISE PROGRAM: Access Code: [email or web address] URL: [email or web address] Date: 03/09/2024 Prepared by: Jarrell Laming  Exercises - Seated Cervical Retraction  - 2 x daily - 7 x weekly - 1 sets - 10 reps - 5 hold - Seated Cervical Rotation AROM  - 1 x daily - 7 x weekly - 1 sets - 10 reps - 5 hold - Seated Cervical Sidebending AROM  - 1 x daily - 7 x weekly - 1 sets - 10 reps - 5 hold - Seated Cervical Flexion AROM  - 1 x daily - 7 x weekly - 1 sets - 10 reps - 5 hold - Sidelying Thoracic Rotation with Open Book  - 1-2 x daily - 7 x weekly - 1 sets - 10 reps - Doorway Pec Stretch at 90 Degrees Abduction  - 2 x daily - 7 x weekly - 1 sets - 3 reps - 30 seconds hold - Seated Hamstring Stretch  - 1 x daily - 7 x weekly - 2 reps - 20 sec hold - Seated Piriformis Stretch with Trunk Bend  - 1 x daily - 7 x weekly - 2 reps - 20 sec hold - Seated Thoracic Flexion and Rotation with Swiss Ball  - 1 x daily - 7 x weekly - 10 reps - Cat Cow  - 1 x daily - 7 x weekly - 2 sets - 10 reps - Quadruped Rocking Backward  - 1 x daily - 7 x weekly - 2 sets - 10 reps - Quadruped Transversus Abdominis Bracing  - 1 x daily - 7 x weekly - 2 sets - 10 reps  ASSESSMENT:  CLINICAL IMPRESSION: Pt reports that she continues to have pain and tension in the Rt thoracic pain. She did yardwork over the weekend and had some increased pain with this.  She was agreeable to dry needling today  to address muscle tension. Pt has been independent and compliant with HEP for flexibility. Good response to dry needling with improved tissue mobility and twitch response to all regions treated. Patient will benefit from skilled PT to address the below impairments and  improve overall function.   OBJECTIVE IMPAIRMENTS: decreased activity tolerance, decreased ROM, decreased strength, increased muscle spasms, impaired flexibility, postural dysfunction, and pain.   ACTIVITY LIMITATIONS: carrying, lifting, sleeping, reach over head, and hygiene/grooming  PARTICIPATION LIMITATIONS: meal prep, cleaning, laundry, driving, occupation, and yard work  PERSONAL FACTORS: Profession and 1 comorbidity: chronic shoulder and LBP are also affecting patient's functional outcome.   REHAB POTENTIAL: Excellent  CLINICAL DECISION MAKING: Evolving/moderate complexity  EVALUATION COMPLEXITY: Moderate   GOALS: Goals reviewed with patient? Yes  SHORT TERM GOALS: Target date: 03/31/2024   Patient will be independent with initial HEP.  Baseline:  Goal status: Met on 03/09/24  2.  Decreased pain in the neck and shoulders by 50% with ADLs  Baseline:  Goal status: Ongoing    LONG TERM GOALS: Target date: 04/28/2024   Patient will be independent with advanced/ongoing HEP to improve outcomes and carryover.  Baseline:  Goal status: Ongoing  2.  Patient will report 85% improvement in neck pain to improve QOL.  Baseline:  Goal status: INITIAL  3.  Patient will demonstrate full pain free cervical ROM for safety with driving.  Baseline:  Goal status:INITIAL  4.   Patient will report 3/50 on NDI to demonstrate improved functional ability.  Baseline: 13/50 Goal status: INITIAL    PLAN:  PT FREQUENCY: 2x/week  PT DURATION: 8 weeks  PLANNED INTERVENTIONS: 97164- PT Re-evaluation, 97110-Therapeutic exercises, 97530- Therapeutic activity, 97112- Neuromuscular re-education, 97535- Self Care, 02859- Manual therapy, [phone]- Aquatic Therapy, [phone]- Electrical stimulation (unattended), [phone]- Traction (mechanical), [phone] (1-2 muscles), [phone] (3+ muscles)- Dry Needling, Patient/Family education, Taping, Joint mobilization, Spinal manipulation, Spinal mobilization, Cryotherapy,  and Moist heat.  PLAN FOR NEXT SESSION: Review and progress HEP,  see how pt responded to dry needling , cervical ROM, postural strengthening    Burnard Joy, PT 03/15/24 5:12 PM   [facility] [address], [address] [place], [place] [address] Phone # [phone] Fax [phone]

## 2024-03-16 ENCOUNTER — Ambulatory Visit (INDEPENDENT_AMBULATORY_CARE_PROVIDER_SITE_OTHER)

## 2024-03-16 DIAGNOSIS — J309 Allergic rhinitis, unspecified: Secondary | ICD-10-CM | POA: Diagnosis not present

## 2024-03-18 ENCOUNTER — Encounter: Admitting: Physical Therapy

## 2024-03-18 ENCOUNTER — Ambulatory Visit

## 2024-03-18 DIAGNOSIS — M5416 Radiculopathy, lumbar region: Secondary | ICD-10-CM

## 2024-03-18 DIAGNOSIS — M542 Cervicalgia: Secondary | ICD-10-CM

## 2024-03-18 DIAGNOSIS — R252 Cramp and spasm: Secondary | ICD-10-CM

## 2024-03-18 NOTE — Therapy (Signed)
 OUTPATIENT PHYSICAL THERAPY TREATMENT NOTE   Patient Name: Valerie [name] MRN: [id number] DOB:26-Mar-1966, 58 y.o., female Today's Date: 03/18/2024  END OF SESSION:  PT End of Session - 03/18/24 0807     Visit Number 5    Date for PT Re-Evaluation 04/28/24    Authorization Type [facility] [employer]    PT Start Time [phone]    PT Stop Time 0807    PT Time Calculation (min) 36 min    Activity Tolerance Patient tolerated treatment well    Behavior During Therapy [facility] for tasks assessed/performed            Past Medical History:  Diagnosis Date   Allergy    cats   Anemia    NOS   Cataract    Female fertility problem    hx of treatment   GERD (gastroesophageal reflux disease)    HSV infection    labialis   RECTAL BLEEDING 07/10/2007   Qualifier: Diagnosis of  By: Charlett MD, Apolinar POUR 2008 colonoscopy [place].    Past Surgical History:  Procedure Laterality Date   CYST REMOVAL HAND Right    DILITATION & CURRETTAGE/HYSTROSCOPY WITH VERSAPOINT RESECTION  08/27/2012   Procedure: DILATATION & CURETTAGE/HYSTEROSCOPY WITH VERSAPOINT RESECTION;  Surgeon: Marie-Lyne Lavoie, MD;  Location: [facility];  Service: Gynecology;;   EYE SURGERY  Lasik   LASIK  2009   mono vision   TRIGGER FINGER RELEASE Right 06/02/2017   Procedure: RELEASE TRIGGER FINGER/A-1 PULLEY RIGHT RING TRIGGER RELEASE;  Surgeon: Murrell Drivers, MD;  Location: [facility];  Service: Orthopedics;  Laterality: Right;   Patient Active Problem List   Diagnosis Date Noted   Genetic testing 03/01/2024   Well woman exam with routine gynecological exam 10/29/2023   Bicipital tendinitis of right shoulder 02/16/2020   Myofascial muscle pain 11/08/2019   Chronic bilateral thoracic back pain 11/08/2019   Pain of both shoulder joints 09/21/2015   Impingement syndrome of right shoulder 03/08/2015   Trigger point of right shoulder region 03/08/2015   Low back pain 03/16/2014   Wrist pain, right 02/09/2014   Nonallopathic  lesion of cervical region 11/03/2013   Nonallopathic lesion of thoracic region 11/03/2013   Nonallopathic lesion-rib cage 11/03/2013   Nonallopathic lesion of lumbosacral region 11/03/2013   Nonallopathic lesion of sacral region 11/03/2013   Imbalance 10/27/2012   Neck pain 10/27/2012   Headache 07/15/2012   Abdominal cramps 02/25/2012   Abnormal stools 02/25/2012   Delayed menses 02/25/2012   Visit for preventive health examination 12/03/2011   Abdominal bloating 12/03/2011   DYSESTHESIA 09/20/2009   ARREST OF BONE DEVELOPMENT OR GROWTH 07/24/2007   DERMATOPHYTOSIS OF NAIL 07/10/2007   ALLERGIC RHINITIS 07/10/2007   GERD 07/10/2007   BLOOD IN STOOL 07/10/2007   HERPES LABIALIS, HX OF 07/10/2007    PCP: Charlett Apolinar POUR, MD   REFERRING PROVIDER: [place], Betty G, MD    REFERRING DIAG:  M54.9,G89.29 (ICD-10-CM) - Upper back pain, chronic  M54.50,G89.29 (ICD-10-CM) - Chronic right-sided low back pain, unspecified whether sciatica present    Rationale for Evaluation and Treatment: Rehabilitation  THERAPY DIAG:  Cramp and spasm  Radiculopathy, lumbar region  Cervicalgia  ONSET DATE: 3 weeks ago  SUBJECTIVE:  SUBJECTIVE STATEMENT: It was weird, I had a spasm in my Rt rhomboid on Tuesday after dry needling.  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 Yes: NPRS scale: 5/10 Pain location: Rt thoracic Pain description: ache tight twisting Aggravating factors: stress, gardening, cleaning, carrying  Relieving factors: massage, heat, tylenol , ice  Are you having pain? Yes: NPRS scale: 4/10 Pain location: low back Pain description: achy Aggravating  factors: prolonged sitting, car 2 hours Relieving factors: standing  PRECAUTIONS: None  RED FLAGS: None   WEIGHT BEARING RESTRICTIONS: No  FALLS:  Has patient fallen in last 6 months? Yes. 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  DIAGNOSTIC FINDINGS:  2019 MRI IMPRESSION: 1. At L4-5 there is a mild broad-based disc bulge. Mild bilateral facet arthropathy and bilateral lateral recess narrowing. 2. At L5-S1 there is a broad-based disc bulge with a small central disc protrusion.  PATIENT SURVEYS:  Eval: NDI  13 / 50 = 26.0 % Quick dash 20.5 / 100 = 20.5 %  COGNITION: Overall cognitive status: Within functional limits for tasks assessed    MUSCLE LENGTH: Tight UT, pectoralis, cervical paraspinals  POSTURE: rounded shoulders and forward head  PALPATION: TTP in B UT, SO, paraspinals, pecs Spinal mobiliy decreased in mid thoracic   CERVICAL ROM:  rotation 40 R  30 L pain turning L into R upper arm, flexion full pain down into thoracic spin Lateral flex R 23   L 30 pain into L shoulder  03/18/24:  Cervical sidebending: Lt 35 Rt 48. Rotation: Rt: 65   Lt 65  UE ROM: Full B except R ABD 160 (full PROM)  LUMBAR ROM: Limited with R rotation 25% feels in R hip also with R SB, else ROM WNL  Negative cervical special tests    TREATMENT DATE:                                                                                                                                03/18/2024: Nustep level 5 x5 min with PT present to discuss status Seated 3 way blue pball rollout x5 each direction Cervical A/ROM into sidebending and rotation  3x20 seconds- measures taken- see above.  Standing 3 way scapular stabilization with blue loop x5 bilat Standing 3 way shoulder elevation with 2# dumbbells x10 each Manual: PA mobs T1-10 grade 2-3, Rt scapular mobs  03/15/2024: Nustep level 5 x6 min with PT present to discuss status Seated 3 way blue  pball rollout x5 each direction Standing open book stretch at wall: x10 each  Standing 3 way scapular stabilization with blue loop x5 bilat Standing 3 way shoulder elevation with 2# dumbbells x10 each Trigger Point Dry Needling  Initial Treatment: Pt instructed on Dry Needling rational, procedures, and possible side effects. Pt instructed to expect mild to moderate muscle soreness later in the day and/or into the next day.  Pt instructed in methods to reduce muscle soreness. Pt instructed to continue prescribed HEP. Because Dry Needling was performed over or adjacent to a lung field, pt was educated on S/S of pneumothorax and to seek immediate medical attention should they occur.  Patient was educated on signs and symptoms of infection and other risk factors and advised to seek medical attention should they occur.  Patient verbalized understanding of these instructions and education.   Patient Verbal Consent Given: Yes Education Handout Provided: Yes Muscles Treated: bil cervical multifidi, thoracic ,multifidi, Rt rhomboids, upper trap and subscapularis Electrical Stimulation Performed: No Treatment Response/Outcome: Utilized skilled palpation to identify bony landmarks and trigger points.  Able to illicit twitch response and muscle elongation.  Soft tissue mobilization to muscles needled following DN to further promote tissue elongation and decreased pain.      03/11/2024: Nustep level 5 x5 min with PT present to discuss status Seated 3 way blue pball rollout x5 each direction Quadruped rocking x10 Quadruped cat/cow x10 Standing 3 way scapular stabilization with blue loop x5  bilat Standing lower trap lift off 2x10 Standing 3 way shoulder elevation with 2# dumbbells x10 each   PATIENT EDUCATION:  Education details: PT eval findings, anticipated POC, initial HEP, postural awareness, and role of TPDN   Person educated: Patient Education method: Explanation, Demonstration, Tactile cues, Verbal cues, and Handouts Education comprehension: verbalized understanding and returned demonstration  HOME EXERCISE PROGRAM: Access Code: [email or web address] URL: [email or web address] Date: 03/09/2024 Prepared by: Jarrell Laming  Exercises - Seated Cervical Retraction  - 2 x daily - 7 x weekly - 1 sets - 10 reps - 5 hold - Seated Cervical Rotation AROM  - 1 x daily - 7 x weekly - 1 sets - 10 reps - 5 hold - Seated Cervical Sidebending AROM  - 1 x daily - 7 x weekly - 1 sets - 10 reps - 5 hold - Seated Cervical Flexion AROM  - 1 x daily - 7 x weekly - 1 sets - 10 reps - 5 hold - Sidelying Thoracic Rotation with Open Book  - 1-2 x daily - 7 x weekly - 1 sets - 10 reps - Doorway Pec Stretch at 90 Degrees Abduction  - 2 x daily - 7 x weekly - 1 sets - 3 reps - 30 seconds hold - Seated Hamstring Stretch  - 1 x daily - 7 x weekly - 2 reps - 20 sec hold - Seated Piriformis Stretch with Trunk Bend  - 1 x daily - 7 x weekly - 2 reps - 20 sec hold - Seated Thoracic Flexion and Rotation with Swiss Ball  - 1 x daily - 7 x weekly - 10 reps - Cat Cow  - 1 x daily - 7 x weekly - 2 sets - 10 reps - Quadruped Rocking Backward  - 1 x daily - 7 x weekly - 2 sets - 10 reps - Quadruped Transversus Abdominis Bracing  - 1 x daily - 7 x weekly - 2 sets - 10 reps  ASSESSMENT:  CLINICAL IMPRESSION: Pt had an episode of  spasm the day after dry needling in the rhomboid. Pt has good and bad days. Cervical A/ROM is improved in all directions.  Reduced PA mobility in the thoracic spine with pain T4-7 and in Rt scapula.  PT monitored throughout session for pain and alignment.  Patient will benefit  from skilled PT to address the below impairments and improve overall function.   OBJECTIVE IMPAIRMENTS: decreased activity tolerance, decreased ROM, decreased strength, increased muscle spasms, impaired flexibility, postural dysfunction, and pain.   ACTIVITY LIMITATIONS: carrying, lifting, sleeping, reach over head, and hygiene/grooming  PARTICIPATION LIMITATIONS: meal prep, cleaning, laundry, driving, occupation, and yard work  PERSONAL FACTORS: Profession and 1 comorbidity: chronic shoulder and LBP are also affecting patient's functional outcome.   REHAB POTENTIAL: Excellent  CLINICAL DECISION MAKING: Evolving/moderate complexity  EVALUATION COMPLEXITY: Moderate   GOALS: Goals reviewed with patient? Yes  SHORT TERM GOALS: Target date: 03/31/2024   Patient will be independent with initial HEP.  Baseline:  Goal status: Met on 03/09/24  2.  Decreased pain in the neck and shoulders by 50% with ADLs  Baseline: 25% (03/18/24) Goal status: Ongoing    LONG TERM GOALS: Target date: 04/28/2024   Patient will be independent with advanced/ongoing HEP to improve outcomes and carryover.  Baseline: 03/18/24 Goal status: MET  2.  Patient will report 85% improvement in neck pain to improve QOL.  Baseline:  Goal status: INITIAL  3.  Patient will demonstrate full pain free cervical ROM for safety with driving.  Baseline:  Goal status:INITIAL  4.   Patient will report 3/50 on NDI to demonstrate improved functional ability.  Baseline: 13/50 Goal status: INITIAL    PLAN:  PT FREQUENCY: 2x/week  PT DURATION: 8 weeks  PLANNED INTERVENTIONS: 97164- PT Re-evaluation, 97110-Therapeutic exercises, 97530- Therapeutic activity, 97112- Neuromuscular re-education, 97535- Self Care, [id number]- Manual therapy, [phone]- Aquatic Therapy, [phone]- Electrical stimulation (unattended), [phone]- Traction (mechanical), [phone] (1-2 muscles), [id number] (3+ muscles)- Dry Needling, Patient/Family education, Taping, Joint  mobilization, Spinal manipulation, Spinal mobilization, Cryotherapy, and Moist heat.  PLAN FOR NEXT SESSION: Review and progress HEP,  cervical ROM, postural strengthening.  Pt will miss next week due to work schedule.      Burnard Joy, PT 03/18/24 8:50 AM   [facility] [address] [place], [place] [address] Phone # [phone] Fax [phone]

## 2024-03-30 NOTE — Therapy (Signed)
 OUTPATIENT PHYSICAL THERAPY TREATMENT NOTE   Patient Name: Valerie [name] MRN: [id number] DOB:07-30-1966, 58 y.o., female Today's Date: 03/31/2024  END OF SESSION:  PT End of Session - 03/31/24 1623     Visit Number 6    Date for PT Re-Evaluation 04/28/24    Authorization Type [facility] [employer]    PT Start Time 0422    PT Stop Time 0510    PT Time Calculation (min) 48 min    Activity Tolerance Patient tolerated treatment well    Behavior During Therapy [facility] for tasks assessed/performed             Past Medical History:  Diagnosis Date   Allergy    cats   Anemia    NOS   Cataract    Female fertility problem    hx of treatment   GERD (gastroesophageal reflux disease)    HSV infection    labialis   RECTAL BLEEDING 07/10/2007   Qualifier: Diagnosis of  By: Charlett MD, Apolinar POUR 2008 colonoscopy [facility].    Past Surgical History:  Procedure Laterality Date   CYST REMOVAL HAND Right    DILITATION & CURRETTAGE/HYSTROSCOPY WITH VERSAPOINT RESECTION  08/27/2012   Procedure: DILATATION & CURETTAGE/HYSTEROSCOPY WITH VERSAPOINT RESECTION;  Surgeon: Marie-Lyne Lavoie, MD;  Location: [facility];  Service: Gynecology;;   EYE SURGERY  Lasik   LASIK  2009   mono vision   TRIGGER FINGER RELEASE Right 06/02/2017   Procedure: RELEASE TRIGGER FINGER/A-1 PULLEY RIGHT RING TRIGGER RELEASE;  Surgeon: Murrell Drivers, MD;  Location: [facility];  Service: Orthopedics;  Laterality: Right;   Patient Active Problem List   Diagnosis Date Noted   Genetic testing 03/01/2024   Well woman exam with routine gynecological exam 10/29/2023   Bicipital tendinitis of right shoulder 02/16/2020   Myofascial muscle pain 11/08/2019   Chronic bilateral thoracic back pain 11/08/2019   Pain of both shoulder joints 09/21/2015   Impingement syndrome of right shoulder 03/08/2015   Trigger point of right shoulder region 03/08/2015   Low back pain 03/16/2014   Wrist pain, right 02/09/2014   Nonallopathic  lesion of cervical region 11/03/2013   Nonallopathic lesion of thoracic region 11/03/2013   Nonallopathic lesion-rib cage 11/03/2013   Nonallopathic lesion of lumbosacral region 11/03/2013   Nonallopathic lesion of sacral region 11/03/2013   Imbalance 10/27/2012   Neck pain 10/27/2012   Headache 07/15/2012   Abdominal cramps 02/25/2012   Abnormal stools 02/25/2012   Delayed menses 02/25/2012   Visit for preventive health examination 12/03/2011   Abdominal bloating 12/03/2011   DYSESTHESIA 09/20/2009   ARREST OF BONE DEVELOPMENT OR GROWTH 07/24/2007   DERMATOPHYTOSIS OF NAIL 07/10/2007   ALLERGIC RHINITIS 07/10/2007   GERD 07/10/2007   BLOOD IN STOOL 07/10/2007   HERPES LABIALIS, HX OF 07/10/2007    PCP: Charlett Apolinar POUR, MD   REFERRING PROVIDER: [place], Betty G, MD    REFERRING DIAG:  M54.9,G89.29 (ICD-10-CM) - Upper back pain, chronic  M54.50,G89.29 (ICD-10-CM) - Chronic right-sided low back pain, unspecified whether sciatica present    Rationale for Evaluation and Treatment: Rehabilitation  THERAPY DIAG:  Cervicalgia  Cramp and spasm  Radiculopathy, lumbar region  ONSET DATE: 3 weeks ago  SUBJECTIVE:  SUBJECTIVE STATEMENT: Trying to do my stretches everyday and it makes a difference. I was able to weed this weekend and it was okay. I feel a catch in my rhomboids region.   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 Yes: NPRS scale: 3/10 Pain location: Rt thoracic Pain description: ache tight twisting Aggravating factors: stress, gardening, cleaning, carrying  Relieving factors: massage, heat, tylenol , ice  Are you having pain? Yes: NPRS scale: 3/10 Pain location: low back Pain description:  achy Aggravating factors: prolonged sitting, car 2 hours Relieving factors: standing  PRECAUTIONS: None  RED FLAGS: None   WEIGHT BEARING RESTRICTIONS: No  FALLS:  Has patient fallen in last 6 months? Yes. 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  DIAGNOSTIC FINDINGS:  2019 MRI IMPRESSION: 1. At L4-5 there is a mild broad-based disc bulge. Mild bilateral facet arthropathy and bilateral lateral recess narrowing. 2. At L5-S1 there is a broad-based disc bulge with a small central disc protrusion.  PATIENT SURVEYS:  Eval: NDI  13 / 50 = 26.0 % Quick dash 20.5 / 100 = 20.5 %  COGNITION: Overall cognitive status: Within functional limits for tasks assessed    MUSCLE LENGTH: Tight UT, pectoralis, cervical paraspinals  POSTURE: rounded shoulders and forward head  PALPATION: TTP in B UT, SO, paraspinals, pecs Spinal mobiliy decreased in mid thoracic   CERVICAL ROM:  rotation 40 R  30 L pain turning L into R upper arm, flexion full pain down into thoracic spin Lateral flex R 23   L 30 pain into L shoulder  03/18/24:  Cervical sidebending: Lt 35 Rt 48. Rotation: Rt: 65   Lt 65  UE ROM: Full B except R ABD 160 (full PROM)  LUMBAR ROM: Limited with R rotation 25% feels in R hip also with R SB, else ROM WNL  Negative cervical special tests    TREATMENT DATE:                                                                                                                                03/31/2024: Nustep level 5 x6 min with PT present to discuss status and monitor form Standing 3 way scapular stabilization with yellow loop x5 bilat  - some pain R anterior shoulder at end of reps Doorway stretch (high) 3 x 30 sec - attempted various positions Manual: PA mobs T1-7  Trigger Point Dry Needling  Subsequent Treatment: Instructions provided previously at initial dry needling treatment.   Patient Verbal Consent Given: Yes Education Handout Provided: Previously Provided  Muscles Treated: B rhomboids, pectoralis, UT, T and C multifidi Electrical Stimulation Performed: No Treatment Response/Outcome: Utilized skilled palpation to identify bony landmarks and trigger points.  Able to illicit twitch response and muscle elongation.  Soft tissue mobilization to muscles needled to further promote tissue elongation and decreased pain.     MHP at end of session to neck    03/18/2024: Nustep level 5 x6 min with PT present to discuss status Seated 3 way blue pball rollout x5 each direction Cervical A/ROM into sidebending and rotation 3x20 seconds- measures taken- see above.  Standing 3 way scapular stabilization with blue loop x5 bilat Standing 3 way shoulder elevation with 2# dumbbells x10 each Manual: PA mobs T1-10 grade 2-3, Rt scapular mobs  03/15/2024: Nustep level 5 x6 min with PT present to discuss status Seated 3 way blue pball rollout x5 each direction Standing open book stretch at wall: x10 each  Standing 3 way scapular stabilization with blue loop x5 bilat Standing 3 way shoulder elevation with 2# dumbbells x10 each Trigger Point Dry Needling  Initial Treatment: Pt instructed on Dry Needling rational, procedures, and possible side effects. Pt instructed to expect mild to moderate muscle soreness later in the day and/or into the next day.  Pt instructed in methods to reduce muscle soreness. Pt instructed to continue prescribed HEP. Because Dry Needling was performed over or adjacent to a lung field, pt was educated on S/S of pneumothorax and to seek immediate medical attention should they occur.  Patient was educated on  signs and symptoms of infection and other risk factors and advised to seek medical attention should they occur.  Patient verbalized understanding of these instructions and education.   Patient Verbal Consent Given: Yes Education Handout Provided: Yes Muscles Treated: bil cervical multifidi, thoracic ,multifidi, Rt rhomboids, upper trap and subscapularis Electrical Stimulation Performed: No Treatment Response/Outcome: Utilized skilled palpation to identify bony landmarks and trigger points.  Able to illicit twitch response and muscle elongation.  Soft tissue mobilization to muscles needled following DN to further promote tissue elongation and decreased pain.      03/11/2024: Nustep level 5 x5 min with PT present to discuss status Seated 3 way blue pball rollout x5 each direction Quadruped rocking x10 Quadruped cat/cow x10 Standing 3 way scapular stabilization with blue loop x5 bilat Standing lower trap lift off 2x10 Standing 3 way shoulder elevation with 2# dumbbells x10 each   PATIENT EDUCATION:  Education details: PT eval findings, anticipated POC, initial HEP, postural awareness, and role of TPDN   Person educated: Patient Education method: Explanation, Demonstration, Tactile cues, Verbal cues, and Handouts Education comprehension: verbalized understanding and returned demonstration  HOME EXERCISE PROGRAM: Access Code: [email or web address] URL: [email or web address] Date: 03/09/2024 Prepared by: Jarrell Laming  Exercises - Seated Cervical Retraction  - 2 x daily - 7 x weekly - 1 sets - 10 reps - 5 hold - Seated Cervical Rotation AROM  - 1 x daily - 7 x weekly - 1 sets - 10 reps - 5 hold - Seated Cervical Sidebending AROM  - 1 x daily - 7 x weekly - 1 sets - 10 reps - 5 hold - Seated Cervical Flexion AROM  - 1 x daily - 7 x weekly - 1 sets - 10 reps - 5 hold - Sidelying Thoracic Rotation with Open Book  - 1-2 x daily - 7 x weekly - 1 sets - 10 reps - Doorway Pec Stretch at 90  Degrees Abduction  - 2 x daily -  7 x weekly - 1 sets - 3 reps - 30 seconds hold - Seated Hamstring Stretch  - 1 x daily - 7 x weekly - 2 reps - 20 sec hold - Seated Piriformis Stretch with Trunk Bend  - 1 x daily - 7 x weekly - 2 reps - 20 sec hold - Seated Thoracic Flexion and Rotation with Swiss Ball  - 1 x daily - 7 x weekly - 10 reps - Cat Cow  - 1 x daily - 7 x weekly - 2 sets - 10 reps - Quadruped Rocking Backward  - 1 x daily - 7 x weekly - 2 sets - 10 reps - Quadruped Transversus Abdominis Bracing  - 1 x daily - 7 x weekly - 2 sets - 10 reps  ASSESSMENT:  CLINICAL IMPRESSION: Patient continues to report improvements, but reports ongoing tightness in upper back. She continues to have excellent response to DN in UT and rhomboids. No significant twitches in B pectoralis muscles although patient could feel it. Thoracic spinal mobility is still limited. She continues to demonstrate potential for improvement and would benefit from continued skilled therapy to address impairments.    OBJECTIVE IMPAIRMENTS: decreased activity tolerance, decreased ROM, decreased strength, increased muscle spasms, impaired flexibility, postural dysfunction, and pain.   ACTIVITY LIMITATIONS: carrying, lifting, sleeping, reach over head, and hygiene/grooming  PARTICIPATION LIMITATIONS: meal prep, cleaning, laundry, driving, occupation, and yard work  PERSONAL FACTORS: Profession and 1 comorbidity: chronic shoulder and LBP are also affecting patient's functional outcome.   REHAB POTENTIAL: Excellent  CLINICAL DECISION MAKING: Evolving/moderate complexity  EVALUATION COMPLEXITY: Moderate   GOALS: Goals reviewed with patient? Yes  SHORT TERM GOALS: Target date: 03/31/2024   Patient will be independent with initial HEP.  Baseline:  Goal status: Met on 03/09/24  2.  Decreased pain in the neck and shoulders by 50% with ADLs  Baseline: 25% (03/18/24) Goal status: Ongoing    LONG TERM GOALS: Target  date: 04/28/2024   Patient will be independent with advanced/ongoing HEP to improve outcomes and carryover.  Baseline: 03/18/24 Goal status: MET  2.  Patient will report 85% improvement in neck pain to improve QOL.  Baseline:  Goal status: INITIAL  3.  Patient will demonstrate full pain free cervical ROM for safety with driving.  Baseline:  Goal status:INITIAL  4.   Patient will report 3/50 on NDI to demonstrate improved functional ability.  Baseline: 13/50 Goal status: INITIAL    PLAN:  PT FREQUENCY: 2x/week  PT DURATION: 8 weeks  PLANNED INTERVENTIONS: 97164- PT Re-evaluation, 97110-Therapeutic exercises, 97530- Therapeutic activity, 97112- Neuromuscular re-education, 97535- Self Care, 02859- Manual therapy, [phone]- Aquatic Therapy, [phone]- Electrical stimulation (unattended), [phone]- Traction (mechanical), [phone] (1-2 muscles), 20561 (3+ muscles)- Dry Needling, Patient/Family education, Taping, Joint mobilization, Spinal manipulation, Spinal mobilization, Cryotherapy, and Moist heat.  PLAN FOR NEXT SESSION: Review and progress HEP,  cervical ROM, postural strengthening.  Assess DN continue with T mobs.   Mliss Cummins, PT  03/31/24 5:19 PM   [facility] [address], [address] [place], [place] [address] Phone # [phone] Fax [phone]

## 2024-03-31 ENCOUNTER — Encounter: Payer: Self-pay | Admitting: Physical Therapy

## 2024-03-31 ENCOUNTER — Ambulatory Visit: Attending: Family Medicine | Admitting: Physical Therapy

## 2024-03-31 DIAGNOSIS — R252 Cramp and spasm: Secondary | ICD-10-CM | POA: Insufficient documentation

## 2024-03-31 DIAGNOSIS — M5416 Radiculopathy, lumbar region: Secondary | ICD-10-CM | POA: Insufficient documentation

## 2024-03-31 DIAGNOSIS — M542 Cervicalgia: Secondary | ICD-10-CM | POA: Diagnosis not present

## 2024-04-01 ENCOUNTER — Ambulatory Visit (INDEPENDENT_AMBULATORY_CARE_PROVIDER_SITE_OTHER)

## 2024-04-01 DIAGNOSIS — J309 Allergic rhinitis, unspecified: Secondary | ICD-10-CM | POA: Diagnosis not present

## 2024-04-12 ENCOUNTER — Ambulatory Visit: Admitting: Physical Therapy

## 2024-04-14 ENCOUNTER — Ambulatory Visit: Admitting: Internal Medicine

## 2024-04-14 ENCOUNTER — Encounter: Payer: Self-pay | Admitting: Internal Medicine

## 2024-04-14 VITALS — BP 100/72 | HR 74 | Temp 97.8°F | Ht 63.75 in | Wt 140.8 lb

## 2024-04-14 DIAGNOSIS — M549 Dorsalgia, unspecified: Secondary | ICD-10-CM

## 2024-04-14 DIAGNOSIS — D72819 Decreased white blood cell count, unspecified: Secondary | ICD-10-CM

## 2024-04-14 DIAGNOSIS — Z79899 Other long term (current) drug therapy: Secondary | ICD-10-CM

## 2024-04-14 DIAGNOSIS — G8929 Other chronic pain: Secondary | ICD-10-CM | POA: Diagnosis not present

## 2024-04-14 DIAGNOSIS — Z0000 Encounter for general adult medical examination without abnormal findings: Secondary | ICD-10-CM | POA: Diagnosis not present

## 2024-04-14 DIAGNOSIS — Z13220 Encounter for screening for lipoid disorders: Secondary | ICD-10-CM | POA: Diagnosis not present

## 2024-04-14 DIAGNOSIS — R1310 Dysphagia, unspecified: Secondary | ICD-10-CM | POA: Diagnosis not present

## 2024-04-14 NOTE — Progress Notes (Signed)
 Chief Complaint  Patient presents with   Medical Management of Chronic Issues    Pt is follow up on back pain. Seeing PT. Pt reports back feels a bit better.     HPI: Valerie [name] 58 y.o. come in for Chronic disease management  See notes  Dr [place]  who ordered PT   Shoulder neck tightness  and off and on   since June  Helping  family member  physically n nursing home. And m in law who eventually passed  . Stopped to day  ongoing pt  but needs fy  Massage therapy at onset  and dry needling .   Not on meds now.  Sleep  about 5  hours for  now  Ocass coughing episode w water water  ? A swallowing issue .wonders if cervical spondylosis coud be issue  Not a problem    with pills    and not the same as ge reflux.   ROS: See pertinent positives and negatives per HPI. No weakness noted   Past Medical History:  Diagnosis Date   Allergy    cats   Anemia    NOS   Cataract    Female fertility problem    hx of treatment   GERD (gastroesophageal reflux disease)    HSV infection    labialis   RECTAL BLEEDING 07/10/2007   Qualifier: Diagnosis of  By: Charlett MD, Apolinar POUR 2008 colonoscopy [facility].     Family History  Problem Relation Age of Onset   Hypertension Mother    Stroke Mother    Allergic rhinitis Mother    Miscarriages / [place] Mother    Diabetes Father    Hyperlipidemia Father    Heart disease Father    Stroke Father    Arthritis Father    Hypertension Father    Colon polyps Brother        elev TG   Allergic rhinitis Brother    Ovarian cancer Maternal Aunt 84 - 69   Liver disease Maternal Uncle        alcohol related   Stroke Maternal Grandmother    Diabetes Maternal Grandmother    Heart disease Maternal Grandmother    Diabetes Maternal Grandfather    Alcohol abuse Cousin    Colon cancer Cousin 15 - 51       maternal first cousin   Breast cancer Other 48 - 28       maternal first cousin once removed   Alcohol abuse Paternal Uncle    Alcohol abuse  Paternal Uncle    Arthritis Sister    Hypertension Sister    Miscarriages / Stillbirths Sister    Cancer Maternal Aunt    Diabetes Maternal Uncle    Early death Paternal Uncle    Hyperlipidemia Brother    Hypertension Brother     Social History   Socioeconomic History   Marital status: Married    Spouse name: Not on file   Number of children: Not on file   Years of education: Not on file   Highest education level: Not on file  Occupational History   Occupation: PA    Employer: [employer]    Comment: Rehab  Tobacco Use   Smoking status: Never    Passive exposure: Never   Smokeless tobacco: Never  Vaping Use   Vaping status: Never Used  Substance and Sexual Activity   Alcohol use: Yes    Comment: once every   Drug  use: No   Sexual activity: Yes    Partners: Male    Birth control/protection: Post-menopausal    Comment: 1st intercourse- 29, partners- 1  Other Topics Concern   Not on file  Social History Narrative   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   Divorced - remarried   Regular exercise - yes   Cat exposure BF   Form [place] in [place] x 27 years   hhof 2 pet cat   Social Drivers of Corporate investment banker Strain: Low Risk  (04/08/2023)   Overall Financial Resource Strain (CARDIA)    Difficulty of Paying Living Expenses: Not hard at all  Food Insecurity: No Food Insecurity (04/08/2023)   Hunger Vital Sign    Worried About Running Out of Food in the Last Year: Never true    Ran Out of Food in the Last Year: Never true  Transportation Needs: No Transportation Needs (04/08/2023)   PRAPARE - Administrator, Civil Service (Medical): No    Lack of Transportation (Non-Medical): No  Physical Activity: Insufficiently Active (04/08/2023)   Exercise Vital Sign    Days of Exercise per Week: 1 day    Minutes of Exercise per Session: 30 min  Stress: No Stress Concern Present (04/08/2023)   [employer] of Occupational Health - Occupational Stress  Questionnaire    Feeling of Stress : Not at all  Social Connections: Socially Integrated (04/08/2023)   Social Connection and Isolation Panel    Frequency of Communication with Friends and Family: More than three times a week    Frequency of Social Gatherings with Friends and Family: Twice a week    Attends Religious Services: More than 4 times per year    Active Member of [employer] or Organizations: Yes    Attends Engineer, structural: More than 4 times per year    Marital Status: Married    Outpatient Medications Prior to Visit  Medication Sig Dispense Refill   APPLE CIDER VINEGAR PO Take by mouth.     Ascorbic Acid (VITAMIN C PO) Take by mouth.     Berberine Chloride (BERBERINE HCI PO) Take by mouth.     cetirizine (ZYRTEC) 10 MG tablet Take 10 mg by mouth daily as needed for allergies.     cholecalciferol (VITAMIN D) 1000 UNITS tablet Take 1,000 Units by mouth daily.     Cyanocobalamin (VITAMIN B-12 PO) Take by mouth.     EPINEPHrine  (EPIPEN  2-PAK) 0.3 mg/0.3 mL IJ SOAJ injection Inject 0.3 mg into the muscle as needed for anaphylaxis. 2 each 1   Omega-3 Fatty Acids (FISH OIL) 1200 MG CAPS Take 1,200 mg by mouth daily.     TURMERIC PO Take by mouth.     valACYclovir  (VALTREX ) 1000 MG tablet Take 2 tablets (2,000 mg total) by mouth and repeat in 12 hours or as directed (Patient taking differently: Take 2,000 mg by mouth as needed.) 30 tablet 0   gabapentin  (NEURONTIN ) 100 MG capsule Take 1 capsule (100 mg total) by mouth 2 (two) times daily. (Patient not taking: Reported on 04/14/2024) 60 capsule 0   methocarbamol  (ROBAXIN ) 500 MG tablet Take 1 tablet (500 mg total) by mouth every 8 (eight) hours as needed for muscle spasms. (Patient not taking: Reported on 04/14/2024) 45 tablet 0   triamcinolone  (KENALOG ) 0.1 % paste Press a small dab (thin layer) to lesion until thin film develops, don't rub in up to 3 times a day. (Patient not taking: Reported  on 04/14/2024) 5 g 99   triamcinolone   (KENALOG ) 0.1 % paste Apply  a thin layer to the affected area up to 3 (three) times daily. Press a small dab to lesion until thin film develops, don't rub in 5 g 12   doxycycline  (VIBRA -TABS) 100 MG tablet Take 1 tablet (100 mg total) by mouth 2 (two) times daily. 20 tablet 0   EPINEPHrine  (EPIPEN  2-PAK) 0.3 mg/0.3 mL IJ SOAJ injection Inject 0.3 mg into the muscle as needed for anaphylaxis. 0.3 mL 1   No facility-administered medications prior to visit.     EXAM:  BP 100/72 (BP Location: Left Arm, Patient Position: Sitting, Cuff Size: Normal)   Pulse 74   Temp 97.8 F (36.6 C) (Oral)   Ht 5' 3.75 (1.619 m)   Wt 140 lb 12.8 oz (63.9 kg)   LMP 11/28/2014 Comment: SA  SpO2 98%   BMI 24.36 kg/m   Body mass index is 24.36 kg/m.  GENERAL: vitals reviewed and listed above, alert, oriented, appears well hydrated and in no acute distress HEENT: atraumatic, conjunctiva  clear, no obvious abnormalities on inspection of external nose and ears OP : no lesion edema or exudate  tongue midline  NECK: no obvious masses on inspection palpation  LUNGS: clear to auscultation bilaterally, no wheezes, rales or rhonchi, good air movement CV: HRRR, no clubbing cyanosis or  peripheral edema nl cap refill  MS: moves all extremities without noticeable focal  abnormality PSYCH: pleasant and cooperative, no obvious depression or anxiety Lab Results  Component Value Date   WBC 3.2 (L) 04/08/2023   HGB 12.6 04/08/2023   HCT 38.6 04/08/2023   PLT 200.0 04/08/2023   GLUCOSE 101 (H) 04/08/2023   CHOL 203 (H) 04/08/2023   TRIG 69.0 04/08/2023   HDL 70.70 04/08/2023   LDLCALC 119 (H) 04/08/2023   ALT 16 04/08/2023   AST 17 04/08/2023   NA 138 04/08/2023   K 4.1 04/08/2023   CL 104 04/08/2023   CREATININE 0.67 04/08/2023   BUN 11 04/08/2023   CO2 27 04/08/2023   TSH 1.20 04/08/2023   HGBA1C 5.7 04/08/2023   BP Readings from Last 3 Encounters:  04/14/24 100/72  03/01/24 122/80  01/26/24 110/70    Advise updated labs  fasting pre PV visit  ASSESSMENT AND PLAN:  Discussed the following assessment and plan:  Swallowing problem - Plan: SLP modified barium swallow, Basic metabolic panel with GFR, CBC with Differential/Platelet, Hemoglobin A1c, Hepatic function panel, Lipid panel, TSH  Upper back pain, chronic - Plan: Basic metabolic panel with GFR, CBC with Differential/Platelet, Hemoglobin A1c, Hepatic function panel, Lipid panel, TSH, C-reactive protein  Medication management - Plan: Basic metabolic panel with GFR, CBC with Differential/Platelet, Hemoglobin A1c, Hepatic function panel, Lipid panel, TSH, C-reactive protein  Leukopenia, unspecified type - Plan: Basic metabolic panel with GFR, CBC with Differential/Platelet, Hemoglobin A1c, Hepatic function panel, Lipid panel, TSH, C-reactive protein  Screening, lipid - Plan: Basic metabolic panel with GFR, CBC with Differential/Platelet, Hemoglobin A1c, Hepatic function panel, Lipid panel, TSH  lab orders t for preventive health examination - Plan: Basic metabolic panel with GFR, CBC with Differential/Platelet, Hemoglobin A1c, Hepatic function panel, Lipid panel, TSH Continue PT and  order swallowing testing  for now  Optimize sleep and muscle exercises at home for now  -Patient advised to return or notify health care team  if  new concerns arise. In interim   Patient Instructions  Optimize  exercise and posture .  And  continue PT.   Sleep. Swallowing study . Get labs done before next PV   Kaydn Kumpf K. Kennan Detter M.D.

## 2024-04-14 NOTE — Patient Instructions (Addendum)
 Optimize  exercise and posture .  And continue PT.   Sleep. Swallowing study . Get labs done before next PV

## 2024-04-15 ENCOUNTER — Other Ambulatory Visit (HOSPITAL_COMMUNITY): Payer: Self-pay | Admitting: Internal Medicine

## 2024-04-15 ENCOUNTER — Ambulatory Visit (INDEPENDENT_AMBULATORY_CARE_PROVIDER_SITE_OTHER)

## 2024-04-15 ENCOUNTER — Encounter: Payer: Self-pay | Admitting: Rehabilitative and Restorative Service Providers"

## 2024-04-15 ENCOUNTER — Ambulatory Visit: Admitting: Rehabilitative and Restorative Service Providers"

## 2024-04-15 DIAGNOSIS — R1310 Dysphagia, unspecified: Secondary | ICD-10-CM

## 2024-04-15 DIAGNOSIS — M5416 Radiculopathy, lumbar region: Secondary | ICD-10-CM | POA: Diagnosis not present

## 2024-04-15 DIAGNOSIS — M542 Cervicalgia: Secondary | ICD-10-CM | POA: Diagnosis not present

## 2024-04-15 DIAGNOSIS — R252 Cramp and spasm: Secondary | ICD-10-CM

## 2024-04-15 DIAGNOSIS — J309 Allergic rhinitis, unspecified: Secondary | ICD-10-CM

## 2024-04-15 NOTE — Therapy (Signed)
 OUTPATIENT PHYSICAL THERAPY TREATMENT NOTE   Patient Name: Valerie [name] MRN: [id number] DOB:03/31/1966, 58 y.o., female Today's Date: 04/15/2024  END OF SESSION:  PT End of Session - 04/15/24 0726     Visit Number 7    Date for PT Re-Evaluation 04/28/24    Authorization Type [facility] [employer]    PT Start Time [phone]    PT Stop Time 0800    PT Time Calculation (min) 33 min    Activity Tolerance Patient tolerated treatment well    Behavior During Therapy [facility] for tasks assessed/performed             Past Medical History:  Diagnosis Date   Allergy    cats   Anemia    NOS   Cataract    Female fertility problem    hx of treatment   GERD (gastroesophageal reflux disease)    HSV infection    labialis   RECTAL BLEEDING 07/10/2007   Qualifier: Diagnosis of  By: Charlett MD, Apolinar POUR 2008 colonoscopy [facility].    Past Surgical History:  Procedure Laterality Date   CYST REMOVAL HAND Right    DILITATION & CURRETTAGE/HYSTROSCOPY WITH VERSAPOINT RESECTION  08/27/2012   Procedure: DILATATION & CURETTAGE/HYSTEROSCOPY WITH VERSAPOINT RESECTION;  Surgeon: Marie-Lyne Lavoie, MD;  Location: [facility];  Service: Gynecology;;   EYE SURGERY  Lasik   LASIK  2009   mono vision   TRIGGER FINGER RELEASE Right 06/02/2017   Procedure: RELEASE TRIGGER FINGER/A-1 PULLEY RIGHT RING TRIGGER RELEASE;  Surgeon: Murrell Drivers, MD;  Location: [facility];  Service: Orthopedics;  Laterality: Right;   Patient Active Problem List   Diagnosis Date Noted   Genetic testing 03/01/2024   Well woman exam with routine gynecological exam 10/29/2023   Bicipital tendinitis of right shoulder 02/16/2020   Myofascial muscle pain 11/08/2019   Chronic bilateral thoracic back pain 11/08/2019   Pain of both shoulder joints 09/21/2015   Impingement syndrome of right shoulder 03/08/2015   Trigger point of right shoulder region 03/08/2015   Low back pain 03/16/2014   Wrist pain, right 02/09/2014   Nonallopathic  lesion of cervical region 11/03/2013   Nonallopathic lesion of thoracic region 11/03/2013   Nonallopathic lesion-rib cage 11/03/2013   Nonallopathic lesion of lumbosacral region 11/03/2013   Nonallopathic lesion of sacral region 11/03/2013   Imbalance 10/27/2012   Neck pain 10/27/2012   Headache 07/15/2012   Abdominal cramps 02/25/2012   Abnormal stools 02/25/2012   Delayed menses 02/25/2012   Visit for preventive health examination 12/03/2011   Abdominal bloating 12/03/2011   DYSESTHESIA 09/20/2009   ARREST OF BONE DEVELOPMENT OR GROWTH 07/24/2007   DERMATOPHYTOSIS OF NAIL 07/10/2007   ALLERGIC RHINITIS 07/10/2007   GERD 07/10/2007   BLOOD IN STOOL 07/10/2007   HERPES LABIALIS, HX OF 07/10/2007    PCP: Charlett Apolinar POUR, MD   REFERRING PROVIDER: [place], Betty G, MD    REFERRING DIAG:  M54.9,G89.29 (ICD-10-CM) - Upper back pain, chronic  M54.50,G89.29 (ICD-10-CM) - Chronic right-sided low back pain, unspecified whether sciatica present    Rationale for Evaluation and Treatment: Rehabilitation  THERAPY DIAG:  Cramp and spasm  Radiculopathy, lumbar region  Cervicalgia  ONSET DATE: 3 weeks ago  SUBJECTIVE:  SUBJECTIVE STATEMENT: Patient states that needling helped.  She reports that her trip went well and she got a lot of steps in.  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 Yes: NPRS scale: 4/10 Pain location: Rt thoracic/shoulder Pain description: ache tight twisting Aggravating factors: stress, gardening, cleaning, carrying  Relieving factors: massage, heat, tylenol , ice  Are you having pain? Yes: NPRS scale: 5/10 Pain location: low back Pain description: achy Aggravating factors: prolonged sitting, car  2 hours Relieving factors: standing  PRECAUTIONS: None  RED FLAGS: None   WEIGHT BEARING RESTRICTIONS: No  FALLS:  Has patient fallen in last 6 months? Yes. 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  DIAGNOSTIC FINDINGS:  2019 MRI IMPRESSION: 1. At L4-5 there is a mild broad-based disc bulge. Mild bilateral facet arthropathy and bilateral lateral recess narrowing. 2. At L5-S1 there is a broad-based disc bulge with a small central disc protrusion.  PATIENT SURVEYS:  Eval: NDI  13 / 50 = 26.0 % Quick dash 20.5 / 100 = 20.5 %  COGNITION: Overall cognitive status: Within functional limits for tasks assessed    MUSCLE LENGTH: Tight UT, pectoralis, cervical paraspinals  POSTURE: rounded shoulders and forward head  PALPATION: TTP in B UT, SO, paraspinals, pecs Spinal mobiliy decreased in mid thoracic   CERVICAL ROM:  rotation 40 R  30 L pain turning L into R upper arm, flexion full pain down into thoracic spin Lateral flex R 23   L 30 pain into L shoulder  03/18/24:  Cervical sidebending: Lt 35 Rt 48. Rotation: Rt: 65   Lt 65  UE ROM: Full B except R ABD 160 (full PROM)  LUMBAR ROM: Limited with R rotation 25% feels in R hip also with R SB, else ROM WNL  Negative cervical special tests    TREATMENT DATE:                                                                                                                                04/15/2024: Nustep level 5 x5 min with PT present to discuss status and monitor form Seated 3 way blue pball rollout x5 each direction Prone on elbows x2 min Prone press-up x10 Prone hip extension  x10 bilat Prone alt UE/hip extension x10 bilat Quadruped transversus abdominus contraction 2x10 Bear planks 3x 10 sec Side planks on elbow 3x5 sec bilat Standing open books at wall with red tband 2x10 bilat Standing 3 way scapular stabilization with yellow loop 2x5 bilat    03/31/2024: Nustep level 5 x6  min with PT present to discuss status and monitor form Standing 3 way scapular stabilization with yellow loop x5 bilat - some pain R anterior shoulder at end of reps Doorway stretch (high) 3 x 30 sec - attempted various positions Manual: PA mobs T1-7  Trigger Point Dry Needling Subsequent Treatment: Instructions provided previously at initial dry needling treatment.  Patient Verbal Consent Given: Yes Education Handout Provided: Previously Provided Muscles Treated: B rhomboids, pectoralis, UT, T and C multifidi Electrical Stimulation Performed: No Treatment Response/Outcome: Utilized skilled palpation to identify bony landmarks and trigger points.  Able to illicit twitch response and muscle elongation.  Soft tissue mobilization to muscles needled to further promote tissue elongation and decreased pain.     MHP at end of session to neck    03/18/2024: Nustep level 5 x6 min with PT present to discuss status Seated 3 way blue pball rollout x5 each direction Cervical A/ROM into sidebending and rotation 3x20 seconds- measures taken- see above.  Standing 3 way scapular stabilization with blue loop x5 bilat Standing 3 way shoulder elevation with 2# dumbbells x10 each Manual: PA mobs T1-10 grade 2-3, Rt scapular mobs    PATIENT EDUCATION:  Education details: PT eval findings, anticipated POC, initial HEP, postural awareness, and role of TPDN   Person educated: Patient Education method: Explanation, Demonstration, Tactile cues, Verbal cues, and Handouts Education comprehension: verbalized understanding and returned demonstration  HOME EXERCISE PROGRAM: Access Code: [email or web address] URL:  [email or web address] Date: 03/09/2024 Prepared by: Jarrell Laming  Exercises - Seated Cervical Retraction  - 2 x daily - 7 x weekly - 1 sets - 10 reps - 5 hold - Seated Cervical Rotation AROM  - 1 x daily - 7 x weekly - 1 sets - 10 reps - 5 hold - Seated Cervical Sidebending AROM  - 1 x daily - 7 x weekly - 1 sets - 10 reps - 5 hold - Seated Cervical Flexion AROM  - 1 x daily - 7 x weekly - 1 sets - 10 reps - 5 hold - Sidelying Thoracic Rotation with Open Book  - 1-2 x daily - 7 x weekly - 1 sets - 10 reps - Doorway Pec Stretch at 90 Degrees Abduction  - 2 x daily - 7 x weekly - 1 sets - 3 reps - 30 seconds hold - Seated Hamstring Stretch  - 1 x daily - 7 x weekly - 2 reps - 20 sec hold - Seated Piriformis Stretch with Trunk Bend  - 1 x daily - 7 x weekly - 2 reps - 20 sec hold - Seated Thoracic Flexion and Rotation with Swiss Ball  - 1 x daily - 7 x weekly - 10 reps - Cat Cow  - 1 x daily - 7 x weekly - 2 sets - 10 reps - Quadruped Rocking Backward  - 1 x daily - 7 x weekly - 2 sets - 10 reps - Quadruped Transversus Abdominis Bracing  - 1 x daily - 7 x weekly - 2 sets - 10 reps  ASSESSMENT:  CLINICAL IMPRESSION: Ms Dredge presents to skilled PT reporting that she tried to do some of her exercises on vacation, but mainly was on her feet a lot.  Patient reported that her neck is feeling about 75% better since starting PT.  She reported more back pain today than cervical pain.  Patient with most difficulty with side planks today secondary to balance and muscle weakness.  Patient has met all short term goals and is  progressing towards long term goals at this time.  Patient continues to require skilled PT to progress towards goal related activities.  OBJECTIVE IMPAIRMENTS: decreased activity tolerance, decreased ROM, decreased strength, increased muscle spasms, impaired flexibility, postural dysfunction, and pain.   ACTIVITY LIMITATIONS: carrying, lifting, sleeping, reach over head,  and hygiene/grooming  PARTICIPATION LIMITATIONS: meal prep, cleaning, laundry, driving, occupation, and yard work  PERSONAL FACTORS: Profession and 1 comorbidity: chronic shoulder and LBP are also affecting patient's functional outcome.   REHAB POTENTIAL: Excellent  CLINICAL DECISION MAKING: Evolving/moderate complexity  EVALUATION COMPLEXITY: Moderate   GOALS: Goals reviewed with patient? Yes  SHORT TERM GOALS: Target date: 03/31/2024   Patient will be independent with initial HEP.  Baseline:  Goal status: Met on 03/09/24  2.  Decreased pain in the neck and shoulders by 50% with ADLs  Baseline: 25% (03/18/24) Goal status: Met on 04/15/24 (reports 75% better)    LONG TERM GOALS: Target date: 04/28/2024   Patient will be independent with advanced/ongoing HEP to improve outcomes and carryover.  Baseline:  Goal status: Ongoing  2.  Patient will report 85% improvement in neck pain to improve QOL.  Baseline:  Goal status: Ongoing (reports 75% improvement on 04/15/24)  3.  Patient will demonstrate full pain free cervical ROM for safety with driving.  Baseline:  Goal status: Ongoing  4.   Patient will report 3/50 on NDI to demonstrate improved functional ability.  Baseline: 13/50 Goal status: INITIAL    PLAN:  PT FREQUENCY: 2x/week  PT DURATION: 8 weeks  PLANNED INTERVENTIONS: 97164- PT Re-evaluation, 97110-Therapeutic exercises, 97530- Therapeutic activity, 97112- Neuromuscular re-education, 97535- Self Care, 02859- Manual therapy, [phone]- Aquatic Therapy, [phone]- Electrical stimulation (unattended), [phone]- Traction (mechanical), [phone] (1-2 muscles), [phone] (3+ muscles)- Dry Needling, Patient/Family education, Taping, Joint mobilization, Spinal manipulation, Spinal mobilization, Cryotherapy, and Moist heat.  PLAN FOR NEXT SESSION: Review and progress HEP,  cervical ROM, postural strengthening, manual/dry needling as needed, DASH and NDI   Jarrell Laming, PT, DPT 04/15/24,  8:12 AM  [facility] [address], [address] [place], [place] [address] Phone # [phone] Fax [phone]

## 2024-04-20 ENCOUNTER — Ambulatory Visit: Attending: Family Medicine | Admitting: Rehabilitative and Restorative Service Providers"

## 2024-04-20 ENCOUNTER — Encounter: Payer: Self-pay | Admitting: Rehabilitative and Restorative Service Providers"

## 2024-04-20 DIAGNOSIS — R293 Abnormal posture: Secondary | ICD-10-CM | POA: Diagnosis present

## 2024-04-20 DIAGNOSIS — R262 Difficulty in walking, not elsewhere classified: Secondary | ICD-10-CM | POA: Insufficient documentation

## 2024-04-20 DIAGNOSIS — R252 Cramp and spasm: Secondary | ICD-10-CM | POA: Insufficient documentation

## 2024-04-20 DIAGNOSIS — M542 Cervicalgia: Secondary | ICD-10-CM | POA: Insufficient documentation

## 2024-04-20 DIAGNOSIS — M6281 Muscle weakness (generalized): Secondary | ICD-10-CM | POA: Diagnosis present

## 2024-04-20 DIAGNOSIS — M5416 Radiculopathy, lumbar region: Secondary | ICD-10-CM | POA: Insufficient documentation

## 2024-04-20 NOTE — Therapy (Signed)
 OUTPATIENT PHYSICAL THERAPY TREATMENT NOTE   Patient Name: Valerie [name] MRN: [id number] DOB:30-Aug-1965, 58 y.o., female Today's Date: 04/20/2024  END OF SESSION:  PT End of Session - 04/20/24 0738     Visit Number 8    Date for PT Re-Evaluation 04/28/24    Authorization Type [facility] [employer]    PT Start Time [phone]    PT Stop Time 0802    PT Time Calculation (min) 26 min    Activity Tolerance Patient tolerated treatment well    Behavior During Therapy [facility] for tasks assessed/performed             Past Medical History:  Diagnosis Date   Allergy    cats   Anemia    NOS   Cataract    Female fertility problem    hx of treatment   GERD (gastroesophageal reflux disease)    HSV infection    labialis   RECTAL BLEEDING 07/10/2007   Qualifier: Diagnosis of  By: Charlett MD, Apolinar POUR 2008 colonoscopy [facility].    Past Surgical History:  Procedure Laterality Date   CYST REMOVAL HAND Right    DILITATION & CURRETTAGE/HYSTROSCOPY WITH VERSAPOINT RESECTION  08/27/2012   Procedure: DILATATION & CURETTAGE/HYSTEROSCOPY WITH VERSAPOINT RESECTION;  Surgeon: Marie-Lyne Lavoie, MD;  Location: [facility];  Service: Gynecology;;   EYE SURGERY  Lasik   LASIK  2009   mono vision   TRIGGER FINGER RELEASE Right 06/02/2017   Procedure: RELEASE TRIGGER FINGER/A-1 PULLEY RIGHT RING TRIGGER RELEASE;  Surgeon: Murrell Drivers, MD;  Location: [facility];  Service: Orthopedics;  Laterality: Right;   Patient Active Problem List   Diagnosis Date Noted   Genetic testing 03/01/2024   Well woman exam with routine gynecological exam 10/29/2023   Bicipital tendinitis of right shoulder 02/16/2020   Myofascial muscle pain 11/08/2019   Chronic bilateral thoracic back pain 11/08/2019   Pain of both shoulder joints 09/21/2015   Impingement syndrome of right shoulder 03/08/2015   Trigger point of right shoulder region 03/08/2015   Low back pain 03/16/2014   Wrist pain, right 02/09/2014   Nonallopathic  lesion of cervical region 11/03/2013   Nonallopathic lesion of thoracic region 11/03/2013   Nonallopathic lesion-rib cage 11/03/2013   Nonallopathic lesion of lumbosacral region 11/03/2013   Nonallopathic lesion of sacral region 11/03/2013   Imbalance 10/27/2012   Neck pain 10/27/2012   Headache 07/15/2012   Abdominal cramps 02/25/2012   Abnormal stools 02/25/2012   Delayed menses 02/25/2012   Visit for preventive health examination 12/03/2011   Abdominal bloating 12/03/2011   DYSESTHESIA 09/20/2009   ARREST OF BONE DEVELOPMENT OR GROWTH 07/24/2007   DERMATOPHYTOSIS OF NAIL 07/10/2007   ALLERGIC RHINITIS 07/10/2007   GERD 07/10/2007   BLOOD IN STOOL 07/10/2007   HERPES LABIALIS, HX OF 07/10/2007    PCP: Charlett Apolinar POUR, MD   REFERRING PROVIDER: [place], Betty G, MD    REFERRING DIAG:  M54.9,G89.29 (ICD-10-CM) - Upper back pain, chronic  M54.50,G89.29 (ICD-10-CM) - Chronic right-sided low back pain, unspecified whether sciatica present    Rationale for Evaluation and Treatment: Rehabilitation  THERAPY DIAG:  Cramp and spasm  Radiculopathy, lumbar region  Cervicalgia  ONSET DATE: 3 weeks ago  SUBJECTIVE:  SUBJECTIVE STATEMENT: Patient states that she is feeling some increased pain today secondary to doing a lot of paperwork this morning.  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 Yes: NPRS scale: 5/10 Pain location: Rt thoracic/shoulder Pain description: ache tight twisting Aggravating factors: stress, gardening, cleaning, carrying  Relieving factors: massage, heat, tylenol , ice  Are you having pain? Yes: NPRS scale: 5/10 Pain location: low back Pain description: achy Aggravating factors: prolonged  sitting, car 2 hours Relieving factors: standing  PRECAUTIONS: None  RED FLAGS: None   WEIGHT BEARING RESTRICTIONS: No  FALLS:  Has patient fallen in last 6 months? Yes. 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  DIAGNOSTIC FINDINGS:  2019 MRI IMPRESSION: 1. At L4-5 there is a mild broad-based disc bulge. Mild bilateral facet arthropathy and bilateral lateral recess narrowing. 2. At L5-S1 there is a broad-based disc bulge with a small central disc protrusion.  PATIENT SURVEYS:  Eval: NDI  13 / 50 = 26.0 % Quick dash 20.5 / 100 = 20.5 %  04/20/2024: Neck Disability Index:  6/50 = 12 % Quick DASH:  11.4/100 = 11.4 %   COGNITION: Overall cognitive status: Within functional limits for tasks assessed    MUSCLE LENGTH: Tight UT, pectoralis, cervical paraspinals  POSTURE: rounded shoulders and forward head  PALPATION: TTP in B UT, SO, paraspinals, pecs Spinal mobiliy decreased in mid thoracic   CERVICAL ROM:  rotation 40 R  30 L pain turning L into R upper arm, flexion full pain down into thoracic spin Lateral flex R 23   L 30 pain into L shoulder  03/18/24:  Cervical sidebending: Lt 35 Rt 48. Rotation: Rt: 65   Lt 65  UE ROM: Full B except R ABD 160 (full PROM)  LUMBAR ROM: Limited with R rotation 25% feels in R hip also with R SB, else ROM WNL  Negative cervical special tests    TREATMENT DATE:                                                                                                                                04/20/2024: Nustep level 5 x5 min with PT present to discuss status and monitor form Neck Disability  Index:  6/50 = 12 % Quick DASH:  11.4/100 = 11.4 % Education on using theracane and having patient trial use Standing 3 way scapular stabilization with yellow loop x5 bilat  Standing open books at wall with red tband x5 bilat Trigger Point Dry Needling Subsequent Treatment: Instructions provided previously at initial dry  needling treatment.  Patient Verbal Consent Given: Yes Education Handout Provided: Previously Provided Muscles Treated: right suboccipital, right upper trap, right rhomboids, right lats Electrical Stimulation Performed: No Treatment Response/Outcome: Utilized skilled palpation to identify bony landmarks and trigger points.  Able to illicit twitch response and muscle elongation.  Soft tissue mobilization following to further promote tissue elongation.     04/15/2024: Nustep level 5 x5 min with PT present to discuss status and monitor form Seated 3 way blue pball rollout x5 each direction Prone on elbows x2 min Prone press-up x10 Prone hip extension x10 bilat Prone alt UE/hip extension x10 bilat Quadruped transversus abdominus contraction 2x10 Bear planks 3x 10 sec Side planks on elbow 3x5 sec bilat Standing open books at wall with red tband 2x10 bilat Standing 3 way scapular stabilization with yellow loop 2x5 bilat    03/31/2024: Nustep level 5 x6 min with PT present to discuss status and monitor form Standing 3 way scapular stabilization with yellow loop x5 bilat - some pain R anterior shoulder at end of reps Doorway stretch (high) 3 x 30 sec - attempted various positions Manual: PA mobs T1-7  Trigger Point Dry Needling Subsequent Treatment: Instructions provided previously at initial dry needling treatment.  Patient Verbal Consent Given: Yes Education Handout Provided: Previously Provided Muscles Treated: B rhomboids, pectoralis, UT, T and C multifidi Electrical Stimulation Performed: No Treatment Response/Outcome: Utilized skilled palpation to identify bony  landmarks and trigger points.  Able to illicit twitch response and muscle elongation.  Soft tissue mobilization to muscles needled to further promote tissue elongation and decreased pain.     MHP at end of session to neck    PATIENT EDUCATION:  Education details: PT eval findings, anticipated POC, initial HEP, postural awareness, and role of TPDN   Person educated: Patient Education method: Explanation, Demonstration, Tactile cues, Verbal cues, and Handouts Education comprehension: verbalized understanding and returned demonstration  HOME EXERCISE PROGRAM: Access Code: [email or web address] URL: [email or web address] Date: 03/09/2024 Prepared by: Jarrell Laming  Exercises - Seated Cervical Retraction  - 2 x daily - 7 x weekly - 1 sets - 10 reps - 5 hold - Seated Cervical Rotation AROM  - 1 x daily - 7 x weekly - 1 sets - 10 reps - 5 hold - Seated Cervical Sidebending AROM  - 1 x daily - 7 x weekly - 1 sets - 10 reps - 5 hold - Seated Cervical Flexion AROM  - 1 x daily - 7 x weekly - 1 sets - 10 reps - 5 hold - Sidelying Thoracic Rotation with Open Book  - 1-2 x daily - 7 x weekly - 1 sets - 10 reps - Doorway Pec Stretch at 90 Degrees Abduction  - 2 x daily - 7 x weekly - 1 sets - 3 reps - 30 seconds hold - Seated Hamstring Stretch  - 1 x daily - 7 x weekly - 2 reps - 20 sec hold - Seated Piriformis Stretch with Trunk Bend  - 1 x daily - 7 x weekly - 2 reps - 20 sec hold - Seated Thoracic Flexion and Rotation with Swiss Ball  - 1 x daily - 7 x weekly - 10 reps - Cat Cow  - 1 x daily - 7 x weekly - 2 sets - 10 reps - Quadruped Rocking Backward  - 1 x daily - 7 x weekly - 2 sets - 10 reps - Quadruped Transversus Abdominis Bracing  - 1 x daily - 7 x weekly -  2 sets - 10 reps  ASSESSMENT:  CLINICAL IMPRESSION: Ms Pollitt presents to skilled PT reporting that she is having some increased pain today secondary to increased documentation over the weekend.  Patient states that she would like  some dry needling today.  Patient asking questions about a massage gun, but states that she feels that it is hard to get properly positioned.  Patient educated in use of a theracane for self trigger point release and she was able to properly return demonstration.  Patient with improvement noted on both DASH and Neck Disability Index and is progressing towards long term goal.  Patient wanted to review exercises with theraband from last session, so had patient perform again, with only minimal cuing required.  Patient  with good twitch response during dry needling, stating that her right side was feeling more impaired today.  Patient states that she may be interested in dry needling of left side next session.  Patient continues to require skilled PT to progress towards goal related activities.  OBJECTIVE IMPAIRMENTS: decreased activity tolerance, decreased ROM, decreased strength, increased muscle spasms, impaired flexibility, postural dysfunction, and pain.   ACTIVITY LIMITATIONS: carrying, lifting, sleeping, reach over head, and hygiene/grooming  PARTICIPATION LIMITATIONS: meal prep, cleaning, laundry, driving, occupation, and yard work  PERSONAL FACTORS: Profession and 1 comorbidity: chronic shoulder and LBP are also affecting patient's functional outcome.   REHAB POTENTIAL: Excellent  CLINICAL DECISION MAKING: Evolving/moderate complexity  EVALUATION COMPLEXITY: Moderate   GOALS: Goals reviewed with patient? Yes  SHORT TERM GOALS: Target date: 03/31/2024   Patient will be independent with initial HEP.  Baseline:  Goal status: Met on 03/09/24  2.  Decreased pain in the neck and shoulders by 50% with ADLs  Baseline: 25% (03/18/24) Goal status: Met on 04/15/24 (reports 75% better)    LONG TERM GOALS: Target date: 04/28/2024   Patient will be independent with advanced/ongoing HEP to improve outcomes and carryover.  Baseline:  Goal status: Ongoing  2.  Patient will report 85% improvement  in neck pain to improve QOL.  Baseline:  Goal status: Ongoing (reports 75% improvement on 04/15/24)  3.  Patient will demonstrate full pain free cervical ROM for safety with driving.  Baseline:  Goal status: Ongoing  4.   Patient will report 3/50 on NDI to demonstrate improved functional ability.  Baseline: 13/50 Goal status: Ongoing (see above)    PLAN:  PT FREQUENCY: 2x/week  PT DURATION: 8 weeks  PLANNED INTERVENTIONS: 97164- PT Re-evaluation, 97110-Therapeutic exercises, 97530- Therapeutic activity, 97112- Neuromuscular re-education, 97535- Self Care, 02859- Manual therapy, [phone]- Aquatic Therapy, G0283- Electrical stimulation (unattended), [phone]- Traction (mechanical), [phone] (1-2 muscles), [phone] (3+ muscles)- Dry Needling, Patient/Family education, Taping, Joint mobilization, Spinal manipulation, Spinal mobilization, Cryotherapy, and Moist heat.  PLAN FOR NEXT SESSION: Review and progress HEP,  cervical ROM, postural strengthening, manual/dry needling as needed   Jarrell Laming, PT, DPT 04/20/24, 8:30 AM  [facility] [address] [place], [place] [address] Phone # [phone] Fax [phone]

## 2024-04-22 ENCOUNTER — Encounter: Payer: Self-pay | Admitting: Rehabilitative and Restorative Service Providers"

## 2024-04-22 ENCOUNTER — Ambulatory Visit: Admitting: Rehabilitative and Restorative Service Providers"

## 2024-04-22 DIAGNOSIS — M5416 Radiculopathy, lumbar region: Secondary | ICD-10-CM

## 2024-04-22 DIAGNOSIS — R252 Cramp and spasm: Secondary | ICD-10-CM | POA: Diagnosis not present

## 2024-04-22 DIAGNOSIS — M542 Cervicalgia: Secondary | ICD-10-CM

## 2024-04-22 NOTE — Therapy (Signed)
 OUTPATIENT PHYSICAL THERAPY TREATMENT NOTE   Patient Name: Valerie [name] MRN: [id number] DOB:1966/04/29, 58 y.o., female Today's Date: 04/22/2024  END OF SESSION:  PT End of Session - 04/22/24 0734     Visit Number 9    Date for PT Re-Evaluation 04/28/24    Authorization Type [facility] [employer]    PT Start Time 0732    PT Stop Time 0802    PT Time Calculation (min) 30 min    Activity Tolerance Patient tolerated treatment well    Behavior During Therapy [facility] for tasks assessed/performed             Past Medical History:  Diagnosis Date   Allergy    cats   Anemia    NOS   Cataract    Female fertility problem    hx of treatment   GERD (gastroesophageal reflux disease)    HSV infection    labialis   RECTAL BLEEDING 07/10/2007   Qualifier: Diagnosis of  By: Charlett MD, Apolinar POUR 2008 colonoscopy [place].    Past Surgical History:  Procedure Laterality Date   CYST REMOVAL HAND Right    DILITATION & CURRETTAGE/HYSTROSCOPY WITH VERSAPOINT RESECTION  08/27/2012   Procedure: DILATATION & CURETTAGE/HYSTEROSCOPY WITH VERSAPOINT RESECTION;  Surgeon: Marie-Lyne Lavoie, MD;  Location: [facility];  Service: Gynecology;;   EYE SURGERY  Lasik   LASIK  2009   mono vision   TRIGGER FINGER RELEASE Right 06/02/2017   Procedure: RELEASE TRIGGER FINGER/A-1 PULLEY RIGHT RING TRIGGER RELEASE;  Surgeon: Murrell Drivers, MD;  Location: [facility];  Service: Orthopedics;  Laterality: Right;   Patient Active Problem List   Diagnosis Date Noted   Genetic testing 03/01/2024   Well woman exam with routine gynecological exam 10/29/2023   Bicipital tendinitis of right shoulder 02/16/2020   Myofascial muscle pain 11/08/2019   Chronic bilateral thoracic back pain 11/08/2019   Pain of both shoulder joints 09/21/2015   Impingement syndrome of right shoulder 03/08/2015   Trigger point of right shoulder region 03/08/2015   Low back pain 03/16/2014   Wrist pain, right 02/09/2014   Nonallopathic  lesion of cervical region 11/03/2013   Nonallopathic lesion of thoracic region 11/03/2013   Nonallopathic lesion-rib cage 11/03/2013   Nonallopathic lesion of lumbosacral region 11/03/2013   Nonallopathic lesion of sacral region 11/03/2013   Imbalance 10/27/2012   Neck pain 10/27/2012   Headache 07/15/2012   Abdominal cramps 02/25/2012   Abnormal stools 02/25/2012   Delayed menses 02/25/2012   Visit for preventive health examination 12/03/2011   Abdominal bloating 12/03/2011   DYSESTHESIA 09/20/2009   ARREST OF BONE DEVELOPMENT OR GROWTH 07/24/2007   DERMATOPHYTOSIS OF NAIL 07/10/2007   ALLERGIC RHINITIS 07/10/2007   GERD 07/10/2007   BLOOD IN STOOL 07/10/2007   HERPES LABIALIS, HX OF 07/10/2007    PCP: Charlett Apolinar POUR, MD   REFERRING PROVIDER: [place], Betty G, MD    REFERRING DIAG:  M54.9,G89.29 (ICD-10-CM) - Upper back pain, chronic  M54.50,G89.29 (ICD-10-CM) - Chronic right-sided low back pain, unspecified whether sciatica present    Rationale for Evaluation and Treatment: Rehabilitation  THERAPY DIAG:  Cramp and spasm  Radiculopathy, lumbar region  Cervicalgia  ONSET DATE: 3 weeks ago  SUBJECTIVE:  SUBJECTIVE STATEMENT: Patient states that she is feeling better after dry needling and would like to have her left side needled today.  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 Yes: NPRS scale: 3/10 Pain location: thoracic/shoulder Pain description: ache tight twisting Aggravating factors: stress, gardening, cleaning, carrying  Relieving factors: massage, heat, tylenol , ice  Are you having pain? Yes: NPRS scale: 5/10 Pain location: low back Pain description: achy Aggravating factors: prolonged sitting,  car 2 hours Relieving factors: standing  PRECAUTIONS: None  RED FLAGS: None   WEIGHT BEARING RESTRICTIONS: No  FALLS:  Has patient fallen in last 6 months? Yes. 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  DIAGNOSTIC FINDINGS:  2019 MRI IMPRESSION: 1. At L4-5 there is a mild broad-based disc bulge. Mild bilateral facet arthropathy and bilateral lateral recess narrowing. 2. At L5-S1 there is a broad-based disc bulge with a small central disc protrusion.  PATIENT SURVEYS:  Eval: NDI  13 / 50 = 26.0 % Quick dash 20.5 / 100 = 20.5 %  04/20/2024: Neck Disability Index:  6/50 = 12 % Quick DASH:  11.4/100 = 11.4 %   COGNITION: Overall cognitive status: Within functional limits for tasks assessed    MUSCLE LENGTH: Tight UT, pectoralis, cervical paraspinals  POSTURE: rounded shoulders and forward head  PALPATION: TTP in B UT, SO, paraspinals, pecs Spinal mobiliy decreased in mid thoracic   CERVICAL ROM:  rotation 40 R  30 L pain turning L into R upper arm, flexion full pain down into thoracic spin Lateral flex R 23   L 30 pain into L shoulder  03/18/24:  Cervical sidebending: Lt 35 Rt 48. Rotation: Rt: 65   Lt 65  UE ROM: Full B except R ABD 160 (full PROM)  LUMBAR ROM: Limited with R rotation 25% feels in R hip also with R SB, else ROM WNL  Negative cervical special tests    TREATMENT DATE:                                                                                                                                04/22/2024: Nustep level 5 x5 min with PT present to discuss status and monitor form Standing 3 way scapular  stabilization with yellow loop 2x5 bilat  Standing open books at wall with red tband x10 bilat Prone on elbows performing cervical diagonals 2x10 bilat Trigger Point Dry Needling Subsequent Treatment: Instructions provided previously at initial dry needling treatment.  Patient Verbal Consent Given: Yes Education Handout Provided: Previously Provided Muscles Treated: left  suboccipital, left upper trap, left rhomboids, left lats, left pec major Electrical Stimulation Performed: No Treatment Response/Outcome: Utilized skilled palpation to identify bony landmarks and trigger points.  Able to illicit twitch response and muscle elongation.  Soft tissue mobilization following to further promote tissue elongation.    04/20/2024: Nustep level 5 x5 min with PT present to discuss status and monitor form Neck Disability Index:  6/50 = 12 % Quick DASH:  11.4/100 = 11.4 % Education on using theracane and having patient trial use Standing 3 way scapular stabilization with yellow loop x5 bilat  Standing open books at wall with red tband x5 bilat Trigger Point Dry Needling Subsequent Treatment: Instructions provided previously at initial dry needling treatment.  Patient Verbal Consent Given: Yes Education Handout Provided: Previously Provided Muscles Treated: right suboccipital, right upper trap, right rhomboids, right lats Electrical Stimulation Performed: No Treatment Response/Outcome: Utilized skilled palpation to identify bony landmarks and trigger points.  Able to illicit twitch response and muscle elongation.  Soft tissue mobilization following to further promote tissue elongation.     04/15/2024: Nustep level 5 x5 min with PT present to discuss status and monitor form Seated 3 way blue pball rollout x5 each direction Prone on elbows x2 min Prone press-up x10 Prone hip extension x10 bilat Prone alt UE/hip extension x10 bilat Quadruped transversus abdominus contraction 2x10 Bear planks 3x 10  sec Side planks on elbow 3x5 sec bilat Standing open books at wall with red tband 2x10 bilat Standing 3 way scapular stabilization with yellow loop 2x5 bilat     PATIENT EDUCATION:  Education details: PT eval findings, anticipated POC, initial HEP, postural awareness, and role of TPDN   Person educated: Patient Education method: Explanation, Demonstration, Tactile cues, Verbal cues, and Handouts Education comprehension: verbalized understanding and returned demonstration  HOME EXERCISE PROGRAM: Access Code: [email or web address] URL: [email or web address] Date: 03/09/2024 Prepared by: Jarrell Laming  Exercises - Seated Cervical Retraction  - 2 x daily - 7 x weekly - 1 sets - 10 reps - 5 hold - Seated Cervical Rotation AROM  - 1 x daily - 7 x weekly - 1 sets - 10 reps - 5 hold - Seated Cervical Sidebending AROM  - 1 x daily - 7 x weekly - 1 sets - 10 reps - 5 hold - Seated Cervical Flexion AROM  - 1 x daily - 7 x weekly - 1 sets - 10 reps - 5 hold - Sidelying Thoracic Rotation with Open Book  - 1-2 x daily - 7 x weekly - 1 sets - 10 reps - Doorway Pec Stretch at 90 Degrees Abduction  - 2 x daily - 7 x weekly - 1 sets - 3 reps - 30 seconds hold - Seated Hamstring Stretch  - 1 x daily - 7 x weekly - 2 reps - 20 sec hold - Seated Piriformis Stretch with Trunk Bend  - 1 x daily - 7 x weekly - 2 reps - 20 sec hold - Seated Thoracic Flexion and Rotation with Swiss Ball  - 1 x daily - 7 x weekly - 10 reps - Cat Cow  - 1 x daily - 7 x weekly - 2 sets - 10 reps - Quadruped Rocking Backward  - 1 x daily - 7 x weekly - 2 sets - 10 reps - Quadruped Transversus Abdominis Bracing  - 1 x daily - 7 x weekly - 2 sets - 10 reps  ASSESSMENT:  CLINICAL IMPRESSION: Ms Veals presents to skilled PT reporting that  she is having less pain.  Patient reports that the exercises are going well and overall, she is feeling better, but not sure that she is ready for discharge next week, as she is still having  some pain.  Patient with good response with new exercise of prone on elbows performing cervical diagonal motion and added this to HEP.  Patient continues to progress with strengthening exercises in clinic to progress with scapular strengthening.  Patient continues to require skilled PT to progress with goal related activities.  OBJECTIVE IMPAIRMENTS: decreased activity tolerance, decreased ROM, decreased strength, increased muscle spasms, impaired flexibility, postural dysfunction, and pain.   ACTIVITY LIMITATIONS: carrying, lifting, sleeping, reach over head, and hygiene/grooming  PARTICIPATION LIMITATIONS: meal prep, cleaning, laundry, driving, occupation, and yard work  PERSONAL FACTORS: Profession and 1 comorbidity: chronic shoulder and LBP are also affecting patient's functional outcome.   REHAB POTENTIAL: Excellent  CLINICAL DECISION MAKING: Evolving/moderate complexity  EVALUATION COMPLEXITY: Moderate   GOALS: Goals reviewed with patient? Yes  SHORT TERM GOALS: Target date: 03/31/2024   Patient will be independent with initial HEP.  Baseline:  Goal status: Met on 03/09/24  2.  Decreased pain in the neck and shoulders by 50% with ADLs  Baseline: 25% (03/18/24) Goal status: Met on 04/15/24 (reports 75% better)    LONG TERM GOALS: Target date: 04/28/2024   Patient will be independent with advanced/ongoing HEP to improve outcomes and carryover.  Baseline:  Goal status: Ongoing  2.  Patient will report 85% improvement in neck pain to improve QOL.  Baseline:  Goal status: Ongoing (reports 75% improvement on 04/15/24)  3.  Patient will demonstrate full pain free cervical ROM for safety with driving.  Baseline:  Goal status: Ongoing  4.   Patient will report 3/50 on NDI to demonstrate improved functional ability.  Baseline: 13/50 Goal status: Ongoing (see above)    PLAN:  PT FREQUENCY: 2x/week  PT DURATION: 8 weeks  PLANNED INTERVENTIONS: 97164- PT Re-evaluation,  97110-Therapeutic exercises, 97530- Therapeutic activity, 97112- Neuromuscular re-education, 97535- Self Care, [id number]- Manual therapy, [phone]- Aquatic Therapy, [id number]- Electrical stimulation (unattended), [phone]- Traction (mechanical), [phone] (1-2 muscles), 20561 (3+ muscles)- Dry Needling, Patient/Family education, Taping, Joint mobilization, Spinal manipulation, Spinal mobilization, Cryotherapy, and Moist heat.  PLAN FOR NEXT SESSION: Review and progress HEP,  cervical ROM, postural strengthening, manual/dry needling as needed, REASSESSMENT   Jarrell Laming, PT, DPT 04/22/24, 8:29 AM  [facility] [address], [address] [place], [place] [address] Phone # [phone] Fax [phone]

## 2024-04-26 ENCOUNTER — Encounter: Payer: Self-pay | Admitting: Rehabilitative and Restorative Service Providers"

## 2024-04-26 ENCOUNTER — Ambulatory Visit: Admitting: Rehabilitative and Restorative Service Providers"

## 2024-04-26 DIAGNOSIS — R252 Cramp and spasm: Secondary | ICD-10-CM

## 2024-04-26 DIAGNOSIS — M542 Cervicalgia: Secondary | ICD-10-CM

## 2024-04-26 DIAGNOSIS — M5416 Radiculopathy, lumbar region: Secondary | ICD-10-CM

## 2024-04-26 NOTE — Therapy (Signed)
 OUTPATIENT PHYSICAL THERAPY TREATMENT NOTE AND REASSESSMENT NOTE   Patient Name: Valerie [name] MRN: [id number] DOB:Dec 28, 1965, 58 y.o., female Today's Date: 04/26/2024  END OF SESSION:  PT End of Session - 04/26/24 0735     Visit Number 10    Date for PT Re-Evaluation 06/18/24    Authorization Type [facility] [employer]    PT Start Time 0732    PT Stop Time 0800    PT Time Calculation (min) 28 min    Activity Tolerance Patient tolerated treatment well    Behavior During Therapy [facility] for tasks assessed/performed             Past Medical History:  Diagnosis Date   Allergy    cats   Anemia    NOS   Cataract    Female fertility problem    hx of treatment   GERD (gastroesophageal reflux disease)    HSV infection    labialis   RECTAL BLEEDING 07/10/2007   Qualifier: Diagnosis of  By: Charlett MD, Apolinar POUR 2008 colonoscopy [place].    Past Surgical History:  Procedure Laterality Date   CYST REMOVAL HAND Right    DILITATION & CURRETTAGE/HYSTROSCOPY WITH VERSAPOINT RESECTION  08/27/2012   Procedure: DILATATION & CURETTAGE/HYSTEROSCOPY WITH VERSAPOINT RESECTION;  Surgeon: Marie-Lyne Lavoie, MD;  Location: [facility];  Service: Gynecology;;   EYE SURGERY  Lasik   LASIK  2009   mono vision   TRIGGER FINGER RELEASE Right 06/02/2017   Procedure: RELEASE TRIGGER FINGER/A-1 PULLEY RIGHT RING TRIGGER RELEASE;  Surgeon: Murrell Drivers, MD;  Location: [facility];  Service: Orthopedics;  Laterality: Right;   Patient Active Problem List   Diagnosis Date Noted   Genetic testing 03/01/2024   Well woman exam with routine gynecological exam 10/29/2023   Bicipital tendinitis of right shoulder 02/16/2020   Myofascial muscle pain 11/08/2019   Chronic bilateral thoracic back pain 11/08/2019   Pain of both shoulder joints 09/21/2015   Impingement syndrome of right shoulder 03/08/2015   Trigger point of right shoulder region 03/08/2015   Low back pain 03/16/2014   Wrist pain, right  02/09/2014   Nonallopathic lesion of cervical region 11/03/2013   Nonallopathic lesion of thoracic region 11/03/2013   Nonallopathic lesion-rib cage 11/03/2013   Nonallopathic lesion of lumbosacral region 11/03/2013   Nonallopathic lesion of sacral region 11/03/2013   Imbalance 10/27/2012   Neck pain 10/27/2012   Headache 07/15/2012   Abdominal cramps 02/25/2012   Abnormal stools 02/25/2012   Delayed menses 02/25/2012   Visit for preventive health examination 12/03/2011   Abdominal bloating 12/03/2011   DYSESTHESIA 09/20/2009   ARREST OF BONE DEVELOPMENT OR GROWTH 07/24/2007   DERMATOPHYTOSIS OF NAIL 07/10/2007   ALLERGIC RHINITIS 07/10/2007   GERD 07/10/2007   BLOOD IN STOOL 07/10/2007   HERPES LABIALIS, HX OF 07/10/2007    PCP: Charlett Apolinar POUR, MD   REFERRING PROVIDER: [place], Betty G, MD    REFERRING DIAG:  M54.9,G89.29 (ICD-10-CM) - Upper back pain, chronic  M54.50,G89.29 (ICD-10-CM) - Chronic right-sided low back pain, unspecified whether sciatica present    Rationale for Evaluation and Treatment: Rehabilitation  THERAPY DIAG:  Cramp and spasm - Plan: PT plan of care cert/re-cert  Radiculopathy, lumbar region - Plan: PT plan of care cert/re-cert  Cervicalgia - Plan: PT plan of care cert/re-cert  ONSET DATE: 3 weeks ago  SUBJECTIVE:  SUBJECTIVE STATEMENT: Patient states that she went to her massage therapist and she told her that her muscles did not seem as tight as last month.  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 Yes: NPRS scale: 4/10 Pain location: thoracic/shoulder Pain description: ache tight twisting Aggravating factors: stress, gardening, cleaning, carrying  Relieving factors: massage, heat,  tylenol , ice  Are you having pain? Yes: NPRS scale: 0/10 Pain location: low back Pain description: achy Aggravating factors: prolonged sitting, car 2 hours Relieving factors: standing  PRECAUTIONS: None  RED FLAGS: None   WEIGHT BEARING RESTRICTIONS: No  FALLS:  Has patient fallen in last 6 months? Yes. 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  DIAGNOSTIC FINDINGS:  2019 MRI IMPRESSION: 1. At L4-5 there is a mild broad-based disc bulge. Mild bilateral facet arthropathy and bilateral lateral recess narrowing. 2. At L5-S1 there is a broad-based disc bulge with a small central disc protrusion.  PATIENT SURVEYS:  Eval: NDI  13 / 50 = 26.0 % Quick dash 20.5 / 100 = 20.5 %  04/20/2024: Neck Disability Index:  6/50 = 12 % Quick DASH:  11.4/100 = 11.4 %  04/26/2024: Quick DASH:  9.09%   COGNITION: Overall cognitive status: Within functional limits for tasks assessed    MUSCLE LENGTH: Tight UT, pectoralis, cervical paraspinals  POSTURE: rounded shoulders and forward head  PALPATION: TTP in B UT, SO, paraspinals, pecs Spinal mobiliy decreased in mid thoracic   CERVICAL ROM:  rotation 40 R  30 L pain turning L into R upper arm, flexion full pain down into thoracic spin Lateral flex R 23   L 30 pain into L shoulder  03/18/24:  Cervical sidebending: Lt 35 Rt 48. Rotation: Rt: 65   Lt 65  UE ROM: Full B except R ABD 160 (full PROM)  04/26/2024: Cervical flexion: 70 deg Cervical extension:  40 deg Cervical right sidebending: 40 deg Cervical left sidebending: 45 deg Cervical right rotation:  60 deg Cervical left rotation:  60  deg  LUMBAR ROM: Limited with R rotation 25% feels in R hip also with R SB, else ROM WNL  Negative cervical special tests    TREATMENT DATE:                                                                                                                                04/26/2024: Nustep level 5 x5 min with PT present to discuss status and monitor form Quick Dash Seated cervical SNAGs for rotation x10 Seated cervical SNAGs for extension x10  Quadruped reach unders with arm on soft foam roll x10 bilat (cuing to not push the stretch too far to have pain) Standing 3 way scapular stabilization with yellow loop 2x5 bilat  Standing pec doorway stretch 2x20 sec bilat Prone on elbows performing cervical diagonals x10 bilat   04/22/2024: Nustep level 5 x5 min with PT present to discuss status and monitor form Standing 3 way scapular stabilization with yellow loop 2x5 bilat  Standing open books at wall with red tband x10 bilat Prone on elbows performing cervical diagonals 2x10 bilat Trigger Point Dry Needling Subsequent Treatment: Instructions provided previously at initial dry needling treatment.  Patient Verbal Consent Given: Yes Education Handout Provided: Previously Provided Muscles Treated: left suboccipital, left upper trap, left rhomboids, left lats, left pec major Electrical Stimulation Performed: No Treatment Response/Outcome: Utilized skilled palpation to identify bony landmarks and trigger points.  Able to illicit twitch response and muscle elongation.  Soft tissue mobilization following to further promote tissue elongation.    04/20/2024: Nustep level 5 x5 min with PT present to discuss status and monitor form Neck Disability Index:  6/50 = 12 % Quick DASH:  11.4/100 = 11.4 % Education on using theracane and having patient trial use Standing 3 way scapular stabilization with yellow loop x5 bilat  Standing open books at wall with red tband x5 bilat Trigger Point Dry  Needling Subsequent Treatment: Instructions provided previously at initial dry needling treatment.  Patient Verbal Consent Given: Yes Education Handout Provided: Previously Provided Muscles Treated: right suboccipital, right upper trap, right rhomboids, right lats Electrical Stimulation Performed: No Treatment Response/Outcome: Utilized skilled palpation to identify bony landmarks and trigger points.  Able to illicit twitch response and muscle elongation.  Soft tissue mobilization following to further promote tissue elongation.      PATIENT EDUCATION:  Education details: PT eval findings, anticipated POC, initial HEP, postural awareness, and role of TPDN   Person educated: Patient Education method: Explanation, Demonstration, Tactile cues, Verbal cues, and Handouts Education comprehension: verbalized understanding and returned demonstration  HOME EXERCISE PROGRAM: Access Code: [email or web address] URL: [email or web address] Date: 04/26/2024 Prepared by: Jarrell Shelbylynn Walczyk  Exercises - Seated Cervical Retraction  - 2 x daily - 7 x weekly - 1 sets - 10 reps - 5 hold - Seated Cervical Sidebending AROM  - 1 x daily - 7 x weekly - 1 sets - 10 reps - 5 hold - Seated Assisted Cervical Rotation with Towel  - 1 x daily - 7 x weekly - 1 sets - 10 reps - Cervical Extension AROM with Strap  - 1 x daily - 7 x weekly - 1 sets - 10 reps - Sidelying Thoracic Rotation with Open Book  - 1-2 x daily - 7 x weekly - 1 sets - 10 reps - Doorway Pec Stretch at 90 Degrees Abduction  - 2 x daily - 7 x weekly - 1 sets - 3 reps - 30 seconds hold - Seated Hamstring Stretch  - 1 x daily - 7 x weekly - 2 reps - 20 sec hold - Seated Piriformis Stretch with Trunk Bend  - 1 x daily - 7 x weekly - 2 reps - 20 sec hold - Seated Thoracic Flexion and Rotation with Swiss Ball  - 1 x daily - 7 x weekly - 10 reps - Cat Cow  - 1 x daily - 7 x weekly - 2 sets - 10 reps - Quadruped Rocking Backward  - 1 x daily - 7 x weekly - 2  sets - 10 reps - Quadruped Transversus Abdominis Bracing  - 1 x daily - 7 x weekly - 2 sets - 10 reps - Bicep Stretch at Table  - 1 x daily - 7 x weekly - 1 sets - 3 reps - 30 sec hold - Cervical Diagonals Prone on Elbows  - 1 x daily - 7 x weekly - 2 sets - 10 reps  ASSESSMENT:  CLINICAL IMPRESSION: Ms Fillingim presents to skilled PT reporting that dry needling does still seem to be helping.  Patient continues to have increased pain, especially with increased documentation at work.  Patient with continued improvement with Quick DASH score today.  Patient is progressing with cervical A/ROM and no pain with movement, just a 'pulling'.  Patient continues to have some increased pain with certain activities.  Patient has not yet met all of her goals for therapy and would benefit from continued skilled PT of 1-2x/week for 8 additional weeks to progress towards goal related activities.  OBJECTIVE IMPAIRMENTS: decreased activity tolerance, decreased ROM, decreased strength, increased muscle spasms, impaired flexibility, postural dysfunction, and pain.   ACTIVITY LIMITATIONS: carrying, lifting, sleeping, reach over head, and hygiene/grooming  PARTICIPATION LIMITATIONS: meal prep, cleaning, laundry, driving, occupation, and yard work  PERSONAL FACTORS: Profession and 1 comorbidity: chronic shoulder and LBP are also affecting patient's functional outcome.   REHAB POTENTIAL: Excellent  CLINICAL DECISION MAKING: Evolving/moderate complexity  EVALUATION COMPLEXITY: Moderate   GOALS: Goals reviewed with patient? Yes  SHORT TERM GOALS: Target date: 03/31/2024   Patient will be independent with initial HEP.  Baseline:  Goal status: Met on 03/09/24  2.  Decreased pain in the neck and shoulders by 50% with ADLs  Baseline: 25% (03/18/24) Goal status: Met on 04/15/24 (reports 75% better)    LONG TERM GOALS: Target date: 06/18/2024   Patient will be independent with advanced/ongoing HEP to improve  outcomes and carryover.  Baseline:  Goal status: Ongoing  2.  Patient will report 85% improvement in neck pain to improve QOL.  Baseline:  Goal status: Ongoing (reports 75% improvement on 04/15/24)  3.  Patient will demonstrate full pain free cervical ROM for safety with driving.  Baseline:  Goal status: Met on 04/26/24   4.   Patient will report 3/50 on NDI to demonstrate improved functional ability.  Baseline: 13/50 Goal status: Ongoing (see above)    PLAN:  PT FREQUENCY: 1-2x/week  PT DURATION: 8 weeks  PLANNED INTERVENTIONS: 97164- PT Re-evaluation, 97750- Physical Performance Testing, 97110-Therapeutic exercises, 97530- Therapeutic activity, [id number]- Neuromuscular re-education, 97535- Self Care, [id number]- Manual therapy, [id number]- Gait training, [phone]- Canalith repositioning, [id number]- Aquatic Therapy, [phone]- Electrical stimulation (unattended), [phone]- Electrical stimulation (manual), [id number]- Vasopneumatic device, [id number]- Ultrasound, [id number]- Traction (mechanical), [id number]- Ionotophoresis 4mg /ml Dexamethasone , [id number] (1-2 muscles), [id number] (3+ muscles)- Dry Needling, Patient/Family education, Balance training, Stair training, Taping, Joint mobilization, Spinal manipulation, Spinal mobilization, Vestibular training, Cryotherapy, and Moist heat.  PLAN FOR NEXT SESSION: Review and progress HEP,  cervical ROM, postural strengthening, manual/dry needling as needed   Jarrell Laming, PT, DPT 04/26/24, 8:29 AM  [facility] [address] [place], [place] [address] Phone # [phone] Fax [phone]

## 2024-04-27 ENCOUNTER — Ambulatory Visit (INDEPENDENT_AMBULATORY_CARE_PROVIDER_SITE_OTHER)

## 2024-04-27 DIAGNOSIS — J309 Allergic rhinitis, unspecified: Secondary | ICD-10-CM

## 2024-04-27 NOTE — Therapy (Signed)
 OUTPATIENT PHYSICAL THERAPY TREATMENT NOTE   Patient Name: Valerie [name] MRN: [id number] DOB:[birth year], 58 y.o., female Today's Date: 04/27/2024  END OF SESSION:       Past Medical History:  Diagnosis Date   Allergy    cats   Anemia    NOS   Cataract    Female fertility problem    hx of treatment   GERD (gastroesophageal reflux disease)    HSV infection    labialis   RECTAL BLEEDING 07/10/2007   Qualifier: Diagnosis of  By: Charlett MD, Apolinar POUR 2008 colonoscopy [facility].    Past Surgical History:  Procedure Laterality Date   CYST REMOVAL HAND Right    DILITATION & CURRETTAGE/HYSTROSCOPY WITH VERSAPOINT RESECTION  08/27/2012   Procedure: DILATATION & CURETTAGE/HYSTEROSCOPY WITH VERSAPOINT RESECTION;  Surgeon: Marie-Lyne Lavoie, MD;  Location: [facility];  Service: Gynecology;;   EYE SURGERY  Lasik   LASIK  2009   mono vision   TRIGGER FINGER RELEASE Right 06/02/2017   Procedure: RELEASE TRIGGER FINGER/A-1 PULLEY RIGHT RING TRIGGER RELEASE;  Surgeon: Murrell Drivers, MD;  Location: [facility];  Service: Orthopedics;  Laterality: Right;   Patient Active Problem List   Diagnosis Date Noted   Genetic testing 03/01/2024   Well woman exam with routine gynecological exam 10/29/2023   Bicipital tendinitis of right shoulder 02/16/2020   Myofascial muscle pain 11/08/2019   Chronic bilateral thoracic back pain 11/08/2019   Pain of both shoulder joints 09/21/2015   Impingement syndrome of right shoulder 03/08/2015   Trigger point of right shoulder region 03/08/2015   Low back pain 03/16/2014   Wrist pain, right 02/09/2014   Nonallopathic lesion of cervical region 11/03/2013   Nonallopathic lesion of thoracic region 11/03/2013   Nonallopathic lesion-rib cage 11/03/2013   Nonallopathic lesion of lumbosacral region 11/03/2013   Nonallopathic lesion of sacral region 11/03/2013   Imbalance 10/27/2012   Neck pain 10/27/2012   Headache 07/15/2012   Abdominal cramps  02/25/2012   Abnormal stools 02/25/2012   Delayed menses 02/25/2012   Visit for preventive health examination 12/03/2011   Abdominal bloating 12/03/2011   DYSESTHESIA 09/20/2009   ARREST OF BONE DEVELOPMENT OR GROWTH 07/24/2007   DERMATOPHYTOSIS OF NAIL 07/10/2007   ALLERGIC RHINITIS 07/10/2007   GERD 07/10/2007   BLOOD IN STOOL 07/10/2007   HERPES LABIALIS, HX OF 07/10/2007    PCP: Charlett Apolinar POUR, MD   REFERRING PROVIDER: [place], Betty G, MD    REFERRING DIAG:  M54.9,G89.29 (ICD-10-CM) - Upper back pain, chronic  M54.50,G89.29 (ICD-10-CM) - Chronic right-sided low back pain, unspecified whether sciatica present    Rationale for Evaluation and Treatment: Rehabilitation  THERAPY DIAG:  No diagnosis found.  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 Yes: NPRS scale: 4/10 Pain location: thoracic/shoulder Pain description: ache tight twisting Aggravating factors: stress, gardening, cleaning, carrying  Relieving factors: massage, heat, tylenol , ice  Are you having pain? Yes: NPRS scale: 0/10 Pain location: low back Pain description: achy Aggravating factors: prolonged sitting, car 2 hours Relieving factors: standing  PRECAUTIONS: None  RED FLAGS: None   WEIGHT BEARING RESTRICTIONS: No  FALLS:  Has patient fallen in last 6 months? Yes. 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  DIAGNOSTIC FINDINGS:  2019 MRI IMPRESSION: 1. At L4-5 there is a mild broad-based disc bulge. Mild bilateral facet arthropathy and bilateral lateral recess narrowing. 2. At L5-S1 there is a broad-based disc bulge with a small central disc protrusion.  PATIENT SURVEYS:  Eval: NDI  13 / 50 = 26.0 % Quick dash 20.5 / 100 = 20.5 %  04/20/2024: Neck Disability Index:  6/50 = 12 % Quick DASH:  11.4/100 = 11.4 %  04/26/2024: Quick DASH:  9.09%   COGNITION: Overall cognitive status: Within functional limits for tasks assessed    MUSCLE LENGTH: Tight UT, pectoralis, cervical paraspinals  POSTURE: rounded shoulders and forward head  PALPATION: TTP in B UT, SO, paraspinals, pecs Spinal mobiliy decreased in mid thoracic   CERVICAL ROM:  rotation 40 R  30 L pain turning L into R upper arm, flexion full pain down into thoracic spin Lateral flex R 23   L 30 pain into L shoulder  03/18/24:  Cervical sidebending: Lt 35 Rt 48. Rotation: Rt: 65   Lt 65  UE ROM: Full B except R ABD 160 (full PROM)  04/26/2024: Cervical flexion: 70 deg Cervical extension:  40 deg Cervical right sidebending: 40 deg Cervical left sidebending: 45 deg Cervical right rotation:  60 deg Cervical left rotation:  60 deg  LUMBAR ROM: Limited with R rotation 25% feels in R hip also with R SB, else ROM WNL  Negative cervical special tests    TREATMENT DATE:                                                                                                                               04/28/2024: Nustep level 5 x5 min with PT present to discuss status and monitor form Seated cervical SNAGs for rotation x10 Seated cervical SNAGs for extension x10 Quadruped reach unders with arm on soft foam roll x10 bilat (cuing to not push the stretch too far to have pain) Standing 3 way scapular stabilization  with yellow loop 2x5 bilat   Standing pec doorway stretch 2x20 sec bilat Prone on elbows performing cervical diagonals x10 bilat   04/26/2024: Nustep level 5 x5 min with PT present to discuss status and monitor form Quick Dash Seated cervical SNAGs for rotation x10 Seated cervical SNAGs for extension x10 Quadruped reach unders with arm on soft foam roll x10 bilat (cuing to not push the stretch too far to have pain) Standing 3 way scapular stabilization with yellow loop 2x5 bilat  Standing pec doorway stretch 2x20 sec bilat Prone on elbows performing cervical diagonals x10 bilat   04/22/2024: Nustep level 5 x5 min with PT present to discuss status and monitor form Standing 3 way scapular stabilization with yellow loop 2x5 bilat  Standing open books at wall with red tband x10 bilat Prone on elbows performing cervical diagonals 2x10 bilat Trigger Point Dry Needling Subsequent Treatment: Instructions provided previously at initial dry needling treatment.  Patient Verbal Consent Given: Yes Education Handout Provided: Previously Provided Muscles Treated: left suboccipital, left upper trap, left rhomboids, left lats, left pec major Electrical Stimulation Performed: No Treatment Response/Outcome: Utilized skilled palpation to identify bony landmarks and trigger points.  Able to illicit twitch response and muscle elongation.  Soft tissue mobilization following to further promote tissue elongation.    04/20/2024: Nustep level 5 x5 min with PT present to discuss status and monitor form Neck Disability Index:  6/50 = 12 % Quick DASH:  11.4/100 = 11.4 % Education on using theracane and having patient trial use Standing 3 way scapular stabilization with yellow loop x5 bilat  Standing open books at wall with red tband x5 bilat Trigger Point Dry Needling Subsequent Treatment: Instructions provided previously at initial dry needling treatment.  Patient Verbal Consent Given: Yes Education Handout Provided: Previously  Provided Muscles Treated: right suboccipital, right upper trap, right rhomboids, right lats Electrical Stimulation Performed: No Treatment Response/Outcome: Utilized skilled palpation to identify bony landmarks and trigger points.  Able to illicit twitch response and muscle elongation.  Soft tissue mobilization following to further promote tissue elongation.      PATIENT EDUCATION:  Education details: PT eval findings, anticipated POC, initial HEP, postural awareness, and role of TPDN   Person educated: Patient Education method: Explanation, Demonstration, Tactile cues, Verbal cues, and Handouts Education comprehension: verbalized understanding and returned demonstration  HOME EXERCISE PROGRAM: Access Code: [email or web address] URL: [email or web address] Date: 04/26/2024 Prepared by: Jarrell Menke  Exercises - Seated Cervical Retraction  - 2 x daily - 7 x weekly - 1 sets - 10 reps - 5 hold - Seated Cervical Sidebending AROM  - 1 x daily - 7 x weekly - 1 sets - 10 reps - 5 hold - Seated Assisted Cervical Rotation with Towel  - 1 x daily - 7 x weekly - 1 sets - 10 reps - Cervical Extension AROM with Strap  - 1 x daily - 7 x weekly - 1 sets - 10 reps - Sidelying Thoracic Rotation with Open Book  - 1-2 x daily - 7 x weekly - 1 sets - 10 reps - Doorway Pec Stretch at 90 Degrees Abduction  - 2 x daily - 7 x weekly - 1 sets - 3 reps - 30 seconds hold - Seated Hamstring Stretch  - 1 x daily - 7 x weekly - 2 reps - 20 sec hold - Seated Piriformis Stretch with Trunk Bend  - 1 x daily - 7 x weekly - 2 reps - 20 sec hold - Seated  Thoracic Flexion and Rotation with Swiss Ball  - 1 x daily - 7 x weekly - 10 reps - Cat Cow  - 1 x daily - 7 x weekly - 2 sets - 10 reps - Quadruped Rocking Backward  - 1 x daily - 7 x weekly - 2 sets - 10 reps - Quadruped Transversus Abdominis Bracing  - 1 x daily - 7 x weekly - 2 sets - 10 reps - Bicep Stretch at Table  - 1 x daily - 7 x weekly - 1 sets - 3 reps -  30 sec hold - Cervical Diagonals Prone on Elbows  - 1 x daily - 7 x weekly - 2 sets - 10 reps  ASSESSMENT:  CLINICAL IMPRESSION: ***  OBJECTIVE IMPAIRMENTS: decreased activity tolerance, decreased ROM, decreased strength, increased muscle spasms, impaired flexibility, postural dysfunction, and pain.   ACTIVITY LIMITATIONS: carrying, lifting, sleeping, reach over head, and hygiene/grooming  PARTICIPATION LIMITATIONS: meal prep, cleaning, laundry, driving, occupation, and yard work  PERSONAL FACTORS: Profession and 1 comorbidity: chronic shoulder and LBP are also affecting patient's functional outcome.   REHAB POTENTIAL: Excellent  CLINICAL DECISION MAKING: Evolving/moderate complexity  EVALUATION COMPLEXITY: Moderate   GOALS: Goals reviewed with patient? Yes  SHORT TERM GOALS: Target date: 03/31/2024   Patient will be independent with initial HEP.  Baseline:  Goal status: Met on 03/09/24  2.  Decreased pain in the neck and shoulders by 50% with ADLs  Baseline: 25% (03/18/24) Goal status: Met on 04/15/24 (reports 75% better)    LONG TERM GOALS: Target date: 06/18/2024   Patient will be independent with advanced/ongoing HEP to improve outcomes and carryover.  Baseline:  Goal status: Ongoing  2.  Patient will report 85% improvement in neck pain to improve QOL.  Baseline:  Goal status: Ongoing (reports 75% improvement on 04/15/24)  3.  Patient will demonstrate full pain free cervical ROM for safety with driving.  Baseline:  Goal status: Met on 04/26/24   4.   Patient will report 3/50 on NDI to demonstrate improved functional ability.  Baseline: 13/50 Goal status: Ongoing (see above)    PLAN:  PT FREQUENCY: 1-2x/week  PT DURATION: 8 weeks  PLANNED INTERVENTIONS: 97164- PT Re-evaluation, 97750- Physical Performance Testing, 97110-Therapeutic exercises, 97530- Therapeutic activity, [id number]- Neuromuscular re-education, 97535- Self Care, 02859- Manual therapy, [id number]- Gait  training, [phone]- Canalith repositioning, [id number]- Aquatic Therapy, [phone]- Electrical stimulation (unattended), [phone]- Electrical stimulation (manual), [id number]- Vasopneumatic device, [id number]- Ultrasound, [id number]- Traction (mechanical), [id number]- Ionotophoresis 4mg /ml Dexamethasone , [id number] (1-2 muscles), [id number] (3+ muscles)- Dry Needling, Patient/Family education, Balance training, Stair training, Taping, Joint mobilization, Spinal manipulation, Spinal mobilization, Vestibular training, Cryotherapy, and Moist heat.  PLAN FOR NEXT SESSION: Review and progress HEP,  cervical ROM, postural strengthening, manual/dry needling as needed   Mliss Cummins, PT  04/27/24, 8:52 PM  [facility] [address], [address] [place], [place] [address] Phone # [phone] Fax [phone]

## 2024-04-28 ENCOUNTER — Ambulatory Visit: Admitting: Physical Therapy

## 2024-04-28 ENCOUNTER — Encounter: Payer: Self-pay | Admitting: Physical Therapy

## 2024-04-28 DIAGNOSIS — R252 Cramp and spasm: Secondary | ICD-10-CM

## 2024-04-28 DIAGNOSIS — M542 Cervicalgia: Secondary | ICD-10-CM

## 2024-04-28 DIAGNOSIS — M5416 Radiculopathy, lumbar region: Secondary | ICD-10-CM

## 2024-04-29 ENCOUNTER — Ambulatory Visit (HOSPITAL_COMMUNITY)
Admission: RE | Admit: 2024-04-29 | Discharge: 2024-04-29 | Disposition: A | Discharge status: Home / Self Care | Source: Ambulatory Visit | Attending: Internal Medicine | Admitting: Internal Medicine

## 2024-04-29 DIAGNOSIS — R1310 Dysphagia, unspecified: Secondary | ICD-10-CM | POA: Insufficient documentation

## 2024-04-29 NOTE — Progress Notes (Signed)
 Modified Barium Swallow Study  Patient Details  Name: Valerie [name] MRN: [id number] Date of Birth: 03-15-1966  Today's Date: 04/29/2024  HPI/PMH: HPI: pt is a 58 yo female referred for MBS by Dr Charlett.  Pt with PMH + for GERD, allergies, cataracts and anemia; presents with complaint of coughing with thin liquids - most notably with water.  Pt also with report of occasional painful swallowing *pointing to sternum*, throat clearing, belching, regurgitation and sensation of food and pills sticking in her throat.  She denies unintended weight loss, pulmonary infections nor requiring heimlich maneuver. Takes her medications with liquids - one a time and occasionally feels like they lodge in her throat. Pt had previously taken a reflux medication short term after having significant reflux symptoms including indigestion/chest pain.   Medication list includes Zyrtec, and multiple vitamins - including Vit D and C, fish oil, berberine, gummy beet, vinegar and turmeric.  She admits to regurgitation if she drinks soda - therefore avoids it. Admits to worsening issues with dysphagia over the last few months - potentially stress induced.   Clinical Impression: No data recorded Factors that may increase risk of adverse event in presence of aspiration Noe & Lianne 2021): No data recorded  Recommendations/Plan: Swallowing Evaluation Recommendations Swallowing Evaluation Recommendations Recommendations: PO diet PO Diet Recommendation: Regular; Thin liquids (Level 0) Liquid Administration via: Cup Medication Administration: Whole meds with liquid (one at a time) Supervision: Patient able to self-feed    Treatment Plan No data recorded   Recommendations Recommendations for follow up therapy are one component of a multi-disciplinary discharge planning process, led by the attending physician.  Recommendations may be updated based on patient status, additional functional criteria and insurance  authorization.  Assessment: Orofacial Exam: Orofacial Exam Oral Cavity: Oral Hygiene: WFL    Anatomy:  Anatomy: WFL   Boluses Administered: Boluses Administered Boluses Administered: Thin liquids (Level 0); Mildly thick liquids (Level 2, nectar thick); Puree; Solid     Oral Impairment Domain: Oral Impairment Domain Lip Closure: No labial escape Tongue control during bolus hold: Cohesive bolus between tongue to palatal seal Bolus preparation/mastication: Timely and efficient chewing and mashing Bolus transport/lingual motion: Brisk tongue motion Oral residue: Complete oral clearance Location of oral residue : N/A Initiation of pharyngeal swallow : Valleculae; Pyriform sinuses     Pharyngeal Impairment Domain: Pharyngeal Impairment Domain Soft palate elevation: No bolus between soft palate (SP)/pharyngeal wall (PW) Laryngeal elevation: Complete superior movement of thyroid  cartilage with complete approximation of arytenoids to epiglottic petiole Anterior hyoid excursion: Complete anterior movement Epiglottic movement: Partial inversion (tip of epiglottic deflection diminished with thin liquids at times) Laryngeal vestibule closure: Complete, no air/contrast in laryngeal vestibule Pharyngeal stripping wave : Present - complete Pharyngeal contraction (A/P view only): Complete Pharyngoesophageal segment opening: Complete distension and complete duration, no obstruction of flow Tongue base retraction: Trace column of contrast or air between tongue base and PPW Pharyngeal residue: Trace residue within or on pharyngeal structures Location of pharyngeal residue: -- (with solid only at vallecular region)     Esophageal Impairment Domain: Esophageal Impairment Domain Esophageal clearance upright position: Esophageal retention    Pill: Pill Consistency administered: Thin liquids (Level 0) Thin liquids (Level 0): [facility]    Penetration/Aspiration Scale Score: No data  recorded  Compensatory Strategies: No data recorded     General Information: Caregiver present: Yes   Diet Prior to this Study: Regular; Thin liquids (Level 0)    Temperature : Normal    Respiratory Status: Advance  Supplemental O2: None (Room air)    History of Recent Intubation: No   Behavior/Cognition: Alert; Cooperative; Pleasant mood  Self-Feeding Abilities: Able to self-feed  Baseline vocal quality/speech: Normal  No data recorded Volitional Swallow: Able to elicit  Exam Limitations: No limitations   Goal Planning: No data recorded No data recorded No data recorded No data recorded No data recorded  Pain: Pain Assessment Pain Assessment: No/denies pain    End of Session: Start Time:SLP Start Time (ACUTE ONLY): 1305  Stop Time: SLP Stop Time (ACUTE ONLY): 1330  Time Calculation:SLP Time Calculation (min) (ACUTE ONLY): 25 min  Charges: SLP Evaluations $ SLP Speech Visit: 1 Visit  SLP Evaluations $Outpatient MBS Swallow: 1 Procedure   SLP visit diagnosis: SLP Visit Diagnosis: Dysphagia, unspecified (R13.10)    Past Medical History:  Past Medical History:  Diagnosis Date   Allergy    cats   Anemia    NOS   Cataract    Female fertility problem    hx of treatment   GERD (gastroesophageal reflux disease)    HSV infection    labialis   RECTAL BLEEDING 07/10/2007   Qualifier: Diagnosis of  By: Charlett MD, Apolinar POUR 2008 colonoscopy [facility].    Past Surgical History:  Past Surgical History:  Procedure Laterality Date   CYST REMOVAL HAND Right    DILITATION & CURRETTAGE/HYSTROSCOPY WITH VERSAPOINT RESECTION  08/27/2012   Procedure: DILATATION & CURETTAGE/HYSTEROSCOPY WITH VERSAPOINT RESECTION;  Surgeon: Marie-Lyne Lavoie, MD;  Location: [facility];  Service: Gynecology;;   EYE SURGERY  Lasik   LASIK  2009   mono vision   TRIGGER FINGER RELEASE Right 06/02/2017   Procedure: RELEASE TRIGGER FINGER/A-1 PULLEY RIGHT RING TRIGGER RELEASE;   Surgeon: Murrell Drivers, MD;  Location: [facility];  Service: Orthopedics;  Laterality: Right;   Madelin POUR, MS [facility] SLP Acute Rehab Services Office [phone]  Nicolas Emmie Caldron 04/29/2024, 3:20 PM

## 2024-04-30 ENCOUNTER — Other Ambulatory Visit (INDEPENDENT_AMBULATORY_CARE_PROVIDER_SITE_OTHER)

## 2024-04-30 DIAGNOSIS — M549 Dorsalgia, unspecified: Secondary | ICD-10-CM | POA: Diagnosis not present

## 2024-04-30 DIAGNOSIS — R1310 Dysphagia, unspecified: Secondary | ICD-10-CM | POA: Diagnosis not present

## 2024-04-30 DIAGNOSIS — Z13220 Encounter for screening for lipoid disorders: Secondary | ICD-10-CM | POA: Diagnosis not present

## 2024-04-30 DIAGNOSIS — Z0000 Encounter for general adult medical examination without abnormal findings: Secondary | ICD-10-CM | POA: Diagnosis not present

## 2024-04-30 DIAGNOSIS — Z79899 Other long term (current) drug therapy: Secondary | ICD-10-CM | POA: Diagnosis not present

## 2024-04-30 DIAGNOSIS — G8929 Other chronic pain: Secondary | ICD-10-CM | POA: Diagnosis not present

## 2024-04-30 DIAGNOSIS — D72819 Decreased white blood cell count, unspecified: Secondary | ICD-10-CM | POA: Diagnosis not present

## 2024-04-30 LAB — CBC WITH DIFFERENTIAL/PLATELET
Basophils Absolute: 0 K/uL (ref 0.0–0.1)
Basophils Relative: 0.3 % (ref 0.0–3.0)
Eosinophils Absolute: 0 K/uL (ref 0.0–0.7)
Eosinophils Relative: 1.3 % (ref 0.0–5.0)
HCT: 31.4 % — ABNORMAL LOW (ref 36.0–46.0)
Hemoglobin: 10.6 g/dL — ABNORMAL LOW (ref 12.0–15.0)
Lymphocytes Relative: 42.5 % (ref 12.0–46.0)
Lymphs Abs: 1.3 K/uL (ref 0.7–4.0)
MCHC: 33.7 g/dL (ref 30.0–36.0)
MCV: 87.9 fl (ref 78.0–100.0)
Monocytes Absolute: 0.2 K/uL (ref 0.1–1.0)
Monocytes Relative: 5.3 % (ref 3.0–12.0)
Neutro Abs: 1.5 K/uL (ref 1.4–7.7)
Neutrophils Relative %: 50.6 % (ref 43.0–77.0)
Platelets: 202 K/uL (ref 150.0–400.0)
RBC: 3.57 Mil/uL — ABNORMAL LOW (ref 3.87–5.11)
RDW: 12.7 % (ref 11.5–15.5)
WBC: 3 K/uL — ABNORMAL LOW (ref 4.0–10.5)

## 2024-04-30 LAB — LIPID PANEL
Cholesterol: 172 mg/dL (ref 0–200)
HDL: 65.2 mg/dL (ref >39.00)
LDL Cholesterol: 98 mg/dL (ref 0–99)
NonHDL: 106.75
Total CHOL/HDL Ratio: 3
Triglycerides: 42 mg/dL (ref 0.0–149.0)
VLDL: 8.4 mg/dL (ref 0.0–40.0)

## 2024-04-30 LAB — BASIC METABOLIC PANEL WITH GFR
BUN: 11 mg/dL (ref 6–23)
CO2: 26 meq/L (ref 19–32)
Calcium: 8.8 mg/dL (ref 8.4–10.5)
Chloride: 106 meq/L (ref 96–112)
Creatinine, Ser: 0.73 mg/dL (ref 0.40–1.20)
GFR: 90.14 mL/min (ref >60.00)
Glucose, Bld: 98 mg/dL (ref 70–99)
Potassium: 4 meq/L (ref 3.5–5.1)
Sodium: 138 meq/L (ref 135–145)

## 2024-04-30 LAB — HEMOGLOBIN A1C: Hgb A1c MFr Bld: 5.8 % (ref 4.6–6.5)

## 2024-04-30 LAB — HEPATIC FUNCTION PANEL
ALT: 17 U/L (ref 0–35)
AST: 19 U/L (ref 0–37)
Albumin: 3.9 g/dL (ref 3.5–5.2)
Alkaline Phosphatase: 64 U/L (ref 39–117)
Bilirubin, Direct: 0.1 mg/dL (ref 0.0–0.3)
Total Bilirubin: 0.3 mg/dL (ref 0.2–1.2)
Total Protein: 6.3 g/dL (ref 6.0–8.3)

## 2024-04-30 LAB — C-REACTIVE PROTEIN: CRP: <1 mg/dL (ref 0.5–20.0)

## 2024-04-30 LAB — TSH: TSH: 1.88 u[IU]/mL (ref 0.35–5.50)

## 2024-05-03 ENCOUNTER — Ambulatory Visit

## 2024-05-03 ENCOUNTER — Ambulatory Visit: Payer: Self-pay | Admitting: Internal Medicine

## 2024-05-03 NOTE — Progress Notes (Signed)
 Sounds like  laryngeal reflux    may try acid blocker for now prilosec or nexium  and can discuss at fu

## 2024-05-04 ENCOUNTER — Other Ambulatory Visit: Payer: Self-pay | Admitting: Internal Medicine

## 2024-05-04 ENCOUNTER — Ambulatory Visit: Admitting: Internal Medicine

## 2024-05-04 ENCOUNTER — Encounter: Payer: Self-pay | Admitting: Internal Medicine

## 2024-05-04 VITALS — BP 110/70 | HR 67 | Temp 97.9°F | Ht 63.0 in | Wt 140.6 lb

## 2024-05-04 DIAGNOSIS — D649 Anemia, unspecified: Secondary | ICD-10-CM

## 2024-05-04 DIAGNOSIS — R1310 Dysphagia, unspecified: Secondary | ICD-10-CM | POA: Diagnosis not present

## 2024-05-04 DIAGNOSIS — Z0000 Encounter for general adult medical examination without abnormal findings: Secondary | ICD-10-CM

## 2024-05-04 DIAGNOSIS — D72819 Decreased white blood cell count, unspecified: Secondary | ICD-10-CM | POA: Diagnosis not present

## 2024-05-04 NOTE — Progress Notes (Addendum)
 Chief Complaint  Patient presents with   Annual Exam    Discuss lab.  No vaccines today. Going to get allergy shot later.     HPI: Patient  Valerie [name]  58 y.o. comes in today for Preventive Health Care visit   and fu of swallowing problem see last visit   study cw elr  Using only ppi as needed.  Abou once every few months  at most .   Donated blood 3 weeks ago  no excess bleeding  Noted wbc lo and wonder autoimmune  fingers  distal have pink thickening  nail eversion.  And ha d hx of mild po ana in past.  R foot can hurt near bunion  ? See foot doc .  Health Maintenance  Topic Date Due   COVID-19 Vaccine (6 - Pfizer risk 2024-25 season) 05/20/2024 (Originally 04/19/2024)   Zoster Vaccines- Shingrix (1 of 2) 08/03/2024 (Originally 02/07/1984)   Influenza Vaccine  11/16/2024 (Originally 03/19/2024)   Pneumococcal Vaccine: 50+ Years (1 of 1 - PCV) 05/04/2025 (Originally 02/07/2015)   Hepatitis B Vaccines 19-59 Average Risk (1 of 3 - 19+ 3-dose series) 05/04/2025 (Originally 02/07/1984)   DTaP/Tdap/Td (3 - Td or Tdap) 12/04/2024   Mammogram  09/23/2025   Colonoscopy  11/15/2025   Cervical Cancer Screening (HPV/Pap Cotest)  10/28/2028   Hepatitis C Screening  Completed   HIV Screening  Completed   HPV VACCINES  Aged Out   Meningococcal B Vaccine  Aged Out   Health Maintenance Review LIFESTYLE:  Exercise:   Tobacco/ETS: n Alcohol:    are sparse  Sugar beverages: caffiene    Sleep: about 4-5  Drug use: no HH of  Work: about 10 per day     ROS:  GEN/ HEENT: No fever, significant weight changes sweats headaches vision problems hearing changes, CV/ PULM; No chest pain shortness of breath cough, syncope,edema  change in exercise tolerance. GI /GU: No adominal pain, vomiting, change in bowel habits. No blood in the stool. No significant GU symptoms. SKIN/HEME: ,no acute skin rashes suspicious lesions or bleeding. No lymphadenopathy, nodules, masses.  NEURO/ PSYCH:  No neurologic  signs such as weakness numbness. No depression anxiety. IMM/ Allergy: No unusual infections.  Allergy .   REST of 12 system review negative except as per HPI   Past Medical History:  Diagnosis Date   Allergy    cats   Anemia    NOS   Cataract    Female fertility problem    hx of treatment   GERD (gastroesophageal reflux disease)    HSV infection    labialis   RECTAL BLEEDING 07/10/2007   Qualifier: Diagnosis of  By: Charlett MD, Apolinar POUR 2008 colonoscopy [facility].     Past Surgical History:  Procedure Laterality Date   CYST REMOVAL HAND Right    DILITATION & CURRETTAGE/HYSTROSCOPY WITH VERSAPOINT RESECTION  08/27/2012   Procedure: DILATATION & CURETTAGE/HYSTEROSCOPY WITH VERSAPOINT RESECTION;  Surgeon: Marie-Lyne Lavoie, MD;  Location: [facility];  Service: Gynecology;;   EYE SURGERY  Lasik   LASIK  2009   mono vision   TRIGGER FINGER RELEASE Right 06/02/2017   Procedure: RELEASE TRIGGER FINGER/A-1 PULLEY RIGHT RING TRIGGER RELEASE;  Surgeon: Murrell Drivers, MD;  Location: [facility];  Service: Orthopedics;  Laterality: Right;    Family History  Problem Relation Age of Onset   Hypertension Mother    Stroke Mother    Allergic rhinitis Mother    Miscarriages / [place]  Mother    Diabetes Father    Hyperlipidemia Father    Heart disease Father    Stroke Father    Arthritis Father    Hypertension Father    Colon polyps Brother        elev TG   Allergic rhinitis Brother    Ovarian cancer Maternal Aunt 26 - 69   Liver disease Maternal Uncle        alcohol related   Stroke Maternal Grandmother    Diabetes Maternal Grandmother    Heart disease Maternal Grandmother    Diabetes Maternal Grandfather    Alcohol abuse Cousin    Colon cancer Cousin 3 - 75       maternal first cousin   Breast cancer Other 13 - 45       maternal first cousin once removed   Alcohol abuse Paternal Uncle    Alcohol abuse Paternal Uncle    Arthritis Sister    Hypertension Sister     Miscarriages / Stillbirths Sister    Cancer Maternal Aunt    Diabetes Maternal Uncle    Early death Paternal Uncle    Hyperlipidemia Brother    Hypertension Brother     Social History   Socioeconomic History   Marital status: Married    Spouse name: Not on file   Number of children: Not on file   Years of education: Not on file   Highest education level: Bachelor's degree (e.g., BA, AB, BS)  Occupational History   Occupation: PA    Employer: [employer]    Comment: Rehab  Tobacco Use   Smoking status: Never    Passive exposure: Never   Smokeless tobacco: Never  Vaping Use   Vaping status: Never Used  Substance and Sexual Activity   Alcohol use: Yes    Comment: once every   Drug use: No   Sexual activity: Yes    Partners: Male    Birth control/protection: Post-menopausal    Comment: 1st intercourse- 29, partners- 1  Other Topics Concern   Not on file  Social History Narrative   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   Divorced - remarried   Regular exercise - yes   Cat exposure BF   Form [place] in [place] x 27 years   hhof 2 pet cat   Social Drivers of Corporate investment banker Strain: Low Risk  (05/03/2024)   Overall Financial Resource Strain (CARDIA)    Difficulty of Paying Living Expenses: Not hard at all  Food Insecurity: No Food Insecurity (05/03/2024)   Hunger Vital Sign    Worried About Running Out of Food in the Last Year: Never true    Ran Out of Food in the Last Year: Never true  Transportation Needs: No Transportation Needs (05/03/2024)   PRAPARE - Administrator, Civil Service (Medical): No    Lack of Transportation (Non-Medical): No  Physical Activity: Insufficiently Active (05/03/2024)   Exercise Vital Sign    Days of Exercise per Week: 1 day    Minutes of Exercise per Session: 20 min  Stress: Stress Concern Present (05/03/2024)   [employer] of Occupational Health - Occupational Stress Questionnaire    Feeling of Stress: To some  extent  Social Connections: Socially Integrated (05/03/2024)   Social Connection and Isolation Panel    Frequency of Communication with Friends and Family: More than three times a week    Frequency of Social Gatherings with Friends and Family: Once  a week    Attends Religious Services: More than 4 times per year    Active Member of Clubs or Organizations: Yes    Attends Banker Meetings: More than 4 times per year    Marital Status: Married    Outpatient Medications Prior to Visit  Medication Sig Dispense Refill   APPLE CIDER VINEGAR PO Take by mouth.     Ascorbic Acid (VITAMIN C PO) Take by mouth.     Berberine Chloride (BERBERINE HCI PO) Take by mouth.     BIOTIN PO Take by mouth.     cetirizine (ZYRTEC) 10 MG tablet Take 10 mg by mouth daily as needed for allergies.     cholecalciferol (VITAMIN D) 1000 UNITS tablet Take 1,000 Units by mouth daily.     Cyanocobalamin (VITAMIN B-12 PO) Take by mouth.     Omega-3 Fatty Acids (FISH OIL) 1200 MG CAPS Take 1,200 mg by mouth daily.     TURMERIC PO Take by mouth.     valACYclovir  (VALTREX ) 1000 MG tablet Take 2 tablets (2,000 mg total) by mouth and repeat in 12 hours or as directed (Patient taking differently: Take 2,000 mg by mouth as needed.) 30 tablet 0   EPINEPHrine  (EPIPEN  2-PAK) 0.3 mg/0.3 mL IJ SOAJ injection Inject 0.3 mg into the muscle as needed for anaphylaxis. (Patient not taking: Reported on 05/04/2024) 2 each 1   gabapentin  (NEURONTIN ) 100 MG capsule Take 1 capsule (100 mg total) by mouth 2 (two) times daily. (Patient not taking: Reported on 05/04/2024) 60 capsule 0   methocarbamol  (ROBAXIN ) 500 MG tablet Take 1 tablet (500 mg total) by mouth every 8 (eight) hours as needed for muscle spasms. (Patient not taking: Reported on 05/04/2024) 45 tablet 0   triamcinolone  (KENALOG ) 0.1 % paste Press a small dab (thin layer) to lesion until thin film develops, don't rub in up to 3 times a day. (Patient not taking: Reported on  05/04/2024) 5 g 99   No facility-administered medications prior to visit.     EXAM:  BP 110/70 (BP Location: Left Arm, Patient Position: Sitting, Cuff Size: Normal)   Pulse 67   Temp 97.9 F (36.6 C) (Oral)   Ht 5' 3 (1.6 m)   Wt 140 lb 9.6 oz (63.8 kg)   LMP 11/28/2014 Comment: SA  SpO2 99%   BMI 24.91 kg/m   Body mass index is 24.91 kg/m. Wt Readings from Last 3 Encounters:  05/04/24 140 lb 9.6 oz (63.8 kg)  04/14/24 140 lb 12.8 oz (63.9 kg)  03/01/24 139 lb 4 oz (63.2 kg)    Physical Exam: Vital signs reviewed HZW:Uypd is a well-developed well-nourished alert cooperative    who appearsr stated age in no acute distress.  HEENT: normocephalic atraumatic , Eyes: PERRL EOM's full, conjunctiva clear, Nares: paten,t no deformity discharge or tenderness., Ears: no deformity EAC's clear TMs with normal landmarks. Mouth: clear OP, no lesions, edema.  Moist mucous membranes. Dentition in adequate repair. NECK: supple without masses, thyromegaly or bruits. CHEST/PULM:  Clear to auscultation and percussion breath sounds equal no wheeze , rales or rhonchi. No chest wall deformities or tenderness. Breast: normal by inspection . No dimpling, discharge, masses, tenderness or discharge . CV: PMI is nondisplaced, S1 S2 no gallops, murmurs, rubs. Peripheral pulses are full without delay.No JVD .  ABDOMEN: Bowel sounds normal nontender  No guard or rebound, no hepato splenomegal no CVA tenderness.   Extremtities:  No clubbing cyanosis or edema, no acute  joint swelling or redness no focal atrophy r bunion mod no other defomity  NEURO:  Oriented x3, cranial nerves 3-12 appear to be intact, no obvious focal weakness,gait within normal limits no abnormal reflexes or asymmetrical SKIN: No acute rashes normal turgor, color, no bruising or petechiae. Distal  fingers  no clubbing  ? Smooth but no firm sclerodatably  PSYCH: Oriented, good eye contact, no obvious depression anxiety, cognition and  judgment appear normal. LN: no cervical axillary adenopathy  Lab Results  Component Value Date   WBC 3.0 (L) 04/30/2024   HGB 10.6 (L) 04/30/2024   HCT 31.4 (L) 04/30/2024   PLT 202.0 04/30/2024   GLUCOSE 98 04/30/2024   CHOL 172 04/30/2024   TRIG 42.0 04/30/2024   HDL 65.20 04/30/2024   LDLCALC 98 04/30/2024   ALT 17 04/30/2024   AST 19 04/30/2024   NA 138 04/30/2024   K 4.0 04/30/2024   CL 106 04/30/2024   CREATININE 0.73 04/30/2024   BUN 11 04/30/2024   CO2 26 04/30/2024   TSH 1.88 04/30/2024   HGBA1C 5.8 04/30/2024    BP Readings from Last 3 Encounters:  05/04/24 110/70  04/14/24 100/72  03/01/24 122/80    Lab results reviewed with patient   ASSESSMENT AND PLAN:  Discussed the following assessment and plan:    ICD-10-CM   1. Visit for preventive health examination  Z00.00     2. Leukopenia, unspecified type  D72.819     3. Anemia, unspecified type  D64.9    prob from blood donation    4. Swallowing problem  R13.10    no obs see report prob SLR    Plan fu labs anemia check   The leukopenia is prob baseline for her , will follow  refer as indicated  Suggest  h2 blocker trial as opposed to ppi for 2 mos  ( avoiding  poss se risk ) and see how she does  Delay vaccine  getting allergy shots today Return for lab [facility]   in 2-3 mos .  Patient Care Team: Ross Hefferan K, MD as PCP - Diedre Frutoso Luz, MD (Allergy) Claudene Arthea HERO, DO as Attending Physician (Sports Medicine) Patient Instructions  Good to see  you .   No blood donation  for now  Check lab  ( [facility] lab orders ) to include cbcdiff  iron  ana  Add acid blocker  H2 ok but take avery day  for at least 2 months  Healthy weigh loss I agree will help.  Reesha Debes K. Traevon Meiring M.D.

## 2024-05-04 NOTE — Patient Instructions (Addendum)
 Good to see  you .   No blood donation  for now  Check lab  ( [facility] lab orders ) to include cbcdiff  iron  ana  Add acid blocker  H2 ok but take avery day  for at least 2 months  Healthy weigh loss I agree will help.

## 2024-05-05 ENCOUNTER — Ambulatory Visit: Admitting: Physical Therapy

## 2024-05-05 DIAGNOSIS — M5416 Radiculopathy, lumbar region: Secondary | ICD-10-CM

## 2024-05-05 DIAGNOSIS — R252 Cramp and spasm: Secondary | ICD-10-CM

## 2024-05-05 DIAGNOSIS — M542 Cervicalgia: Secondary | ICD-10-CM

## 2024-05-05 NOTE — Therapy (Signed)
 OUTPATIENT PHYSICAL THERAPY TREATMENT NOTE   Patient Name: Valerie [name] MRN: [id number] DOB:[birth year], 58 y.o., female Today's Date: 05/05/2024  END OF SESSION:  PT End of Session - 05/05/24 1625     Visit Number 12    Date for PT Re-Evaluation 06/18/24    Authorization Type [facility] [employer]    PT Start Time 1625    PT Stop Time 1700    PT Time Calculation (min) 35 min    Activity Tolerance Patient tolerated treatment well    Behavior During Therapy [facility] for tasks assessed/performed               Past Medical History:  Diagnosis Date   Allergy    cats   Anemia    NOS   Cataract    Female fertility problem    hx of treatment   GERD (gastroesophageal reflux disease)    HSV infection    labialis   RECTAL BLEEDING 07/10/2007   Qualifier: Diagnosis of  By: Charlett MD, Apolinar POUR 2008 colonoscopy [place].    Past Surgical History:  Procedure Laterality Date   CYST REMOVAL HAND Right    DILITATION & CURRETTAGE/HYSTROSCOPY WITH VERSAPOINT RESECTION  08/27/2012   Procedure: DILATATION & CURETTAGE/HYSTEROSCOPY WITH VERSAPOINT RESECTION;  Surgeon: Marie-Lyne Lavoie, MD;  Location: [facility];  Service: Gynecology;;   EYE SURGERY  Lasik   LASIK  2009   mono vision   TRIGGER FINGER RELEASE Right 06/02/2017   Procedure: RELEASE TRIGGER FINGER/A-1 PULLEY RIGHT RING TRIGGER RELEASE;  Surgeon: Murrell Drivers, MD;  Location: [facility];  Service: Orthopedics;  Laterality: Right;   Patient Active Problem List   Diagnosis Date Noted   Genetic testing 03/01/2024   Well woman exam with routine gynecological exam 10/29/2023   Bicipital tendinitis of right shoulder 02/16/2020   Myofascial muscle pain 11/08/2019   Chronic bilateral thoracic back pain 11/08/2019   Pain of both shoulder joints 09/21/2015   Impingement syndrome of right shoulder 03/08/2015   Trigger point of right shoulder region 03/08/2015   Low back pain 03/16/2014   Wrist pain, right 02/09/2014    Nonallopathic lesion of cervical region 11/03/2013   Nonallopathic lesion of thoracic region 11/03/2013   Nonallopathic lesion-rib cage 11/03/2013   Nonallopathic lesion of lumbosacral region 11/03/2013   Nonallopathic lesion of sacral region 11/03/2013   Imbalance 10/27/2012   Neck pain 10/27/2012   Headache 07/15/2012   Abdominal cramps 02/25/2012   Abnormal stools 02/25/2012   Delayed menses 02/25/2012   Visit for preventive health examination 12/03/2011   Abdominal bloating 12/03/2011   DYSESTHESIA 09/20/2009   ARREST OF BONE DEVELOPMENT OR GROWTH 07/24/2007   DERMATOPHYTOSIS OF NAIL 07/10/2007   ALLERGIC RHINITIS 07/10/2007   GERD 07/10/2007   BLOOD IN STOOL 07/10/2007   HERPES LABIALIS, HX OF 07/10/2007    PCP: Charlett Apolinar POUR, MD   REFERRING PROVIDER: [place], Betty G, MD    REFERRING DIAG:  M54.9,G89.29 (ICD-10-CM) - Upper back pain, chronic  M54.50,G89.29 (ICD-10-CM) - Chronic right-sided low back pain, unspecified whether sciatica present    Rationale for Evaluation and Treatment: Rehabilitation  THERAPY DIAG:  Cramp and spasm  Radiculopathy, lumbar region  Cervicalgia  ONSET DATE: 3 weeks ago  SUBJECTIVE:  SUBJECTIVE STATEMENT: I'm doing better. Biggest issue is the R rhomboids and Upper traps.  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 Yes: NPRS scale: 4/10 Pain location: thoracic/shoulder Pain description: ache tight twisting Aggravating factors: stress, gardening, cleaning, carrying  Relieving factors: massage, heat, tylenol , ice  Are you having pain? Yes: NPRS scale: 0/10 Pain location: low back Pain description: achy Aggravating factors: prolonged sitting, car 2 hours Relieving factors:  standing  PRECAUTIONS: None  RED FLAGS: None   WEIGHT BEARING RESTRICTIONS: No  FALLS:  Has patient fallen in last 6 months? Yes. 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  DIAGNOSTIC FINDINGS:  2019 MRI IMPRESSION: 1. At L4-5 there is a mild broad-based disc bulge. Mild bilateral facet arthropathy and bilateral lateral recess narrowing. 2. At L5-S1 there is a broad-based disc bulge with a small central disc protrusion.  PATIENT SURVEYS:  Eval: NDI  13 / 50 = 26.0 % Quick dash 20.5 / 100 = 20.5 %  04/20/2024: Neck Disability Index:  6/50 = 12 % Quick DASH:  11.4/100 = 11.4 %  04/26/2024: Quick DASH:  9.09%   COGNITION: Overall cognitive status: Within functional limits for tasks assessed    MUSCLE LENGTH: Tight UT, pectoralis, cervical paraspinals  POSTURE: rounded shoulders and forward head  PALPATION: TTP in B UT, SO, paraspinals, pecs Spinal mobiliy decreased in mid thoracic   CERVICAL ROM:  rotation 40 R  30 L pain turning L into R upper arm, flexion full pain down into thoracic spin Lateral flex R 23   L 30 pain into L shoulder  03/18/24:  Cervical sidebending: Lt 35 Rt 48. Rotation: Rt: 65   Lt 65  UE ROM: Full B except R ABD 160 (full PROM)  04/26/2024: Cervical flexion: 70 deg Cervical extension:  40 deg Cervical right sidebending: 40 deg Cervical left sidebending: 45 deg Cervical right rotation:  60 deg Cervical left rotation:  60 deg  LUMBAR ROM: Limited with R rotation 25% feels in R hip also with R SB, else ROM WNL  Negative cervical special tests    TREATMENT DATE:                                                                                                                                05/05/2024: Standing 3 way scapular stabilization with yellow loop 2x5 bilat B then did 2x5 with blue (better form and ROM and less compensation in trunk) Standing rows and ext green 2x10 ea Seated chin tucks x 10 Trigger Point Dry Needling  Subsequent Treatment: Instructions provided previously at initial dry needling treatment.  Patient Verbal Consent Given: Yes Education Handout Provided: Previously Provided Muscles Treated: B UT, LS, cervical multifidi, B rhomboids, low traps and thoracic paraspinals Electrical Stimulation Performed: No Treatment Response/Outcome: Utilized skilled palpation to identify bony landmarks and trigger points.  Able to illicit twitch response and muscle elongation.  Deep Soft tissue mobilization following to further promote tissue elongation.    04/28/2024: Nustep level 5 x6 min with PT present to discuss status and monitor form Seated UT stretch 2x30 sec B Seated Levator stretch 2x30 sec B Standing rows and ext green 2x10 ea Trigger Point Dry Needling Subsequent Treatment: Instructions provided previously at initial dry needling treatment.  Patient Verbal Consent Given: Yes Education Handout Provided: Previously Provided Muscles Treated: B UT, LS, cervical multifidi, B rhomboids Electrical Stimulation Performed: No Treatment Response/Outcome: Utilized skilled palpation to identify bony landmarks and trigger points.  Able to illicit twitch response and muscle elongation.  Deep Soft tissue mobilization following to further promote tissue elongation.   04/26/2024: Nustep level 5 x5 min with PT present to discuss status and monitor form Quick Dash Seated cervical SNAGs for rotation x10 Seated cervical SNAGs for extension x10 Quadruped reach unders with arm on soft foam roll x10 bilat (cuing to not push the stretch too far to have pain) Standing 3 way scapular  stabilization with yellow loop 2x5 bilat  Standing pec doorway stretch 2x20 sec bilat Prone on elbows performing cervical diagonals x10 bilat   04/22/2024: Nustep level 5 x5 min with PT present to discuss status and monitor form Standing 3 way scapular stabilization with yellow loop 2x5 bilat  Standing open books at wall with red tband x10 bilat Prone on elbows performing cervical diagonals 2x10 bilat Trigger Point Dry Needling Subsequent Treatment: Instructions provided previously at initial dry needling treatment.  Patient Verbal Consent Given: Yes Education Handout Provided: Previously Provided Muscles Treated: left suboccipital, left upper trap, left rhomboids, left lats, left pec major Electrical Stimulation Performed: No Treatment Response/Outcome: Utilized skilled palpation to identify bony landmarks and trigger points.  Able to illicit twitch response and muscle elongation.  Soft tissue mobilization following to further promote tissue elongation.    04/20/2024: Nustep level 5 x5 min with PT present to discuss status and monitor form Neck Disability Index:  6/50 = 12 % Quick DASH:  11.4/100 = 11.4 % Education on using theracane and having patient trial use Standing 3 way scapular stabilization with yellow loop x5 bilat  Standing open books at wall with red tband x5 bilat Trigger Point Dry Needling Subsequent Treatment: Instructions provided previously at initial dry needling treatment.  Patient Verbal Consent Given: Yes Education Handout Provided: Previously Provided Muscles Treated: right suboccipital, right upper trap, right rhomboids, right lats Electrical Stimulation Performed: No Treatment Response/Outcome: Utilized skilled palpation to identify bony landmarks and trigger points.  Able to illicit twitch response and muscle elongation.  Soft tissue mobilization following to further promote tissue elongation.      PATIENT EDUCATION:  Education details: PT eval findings,  anticipated POC, initial HEP, postural awareness, and role of TPDN   Person educated: Patient Education method: Explanation, Demonstration, Tactile cues, Verbal cues, and Handouts Education comprehension: verbalized understanding and returned demonstration  HOME EXERCISE PROGRAM: Access Code: [email or web address] URL: [email or web address] Date: 04/28/2024 Prepared by: Mliss  Exercises - Seated Cervical Retraction  - 2 x daily - 7 x weekly - 1 sets - 10 reps - 5 hold - Seated Cervical Sidebending  AROM  - 1 x daily - 7 x weekly - 1 sets - 10 reps - 5 hold - Seated Assisted Cervical Rotation with Towel  - 1 x daily - 7 x weekly - 1 sets - 10 reps - Cervical Extension AROM with Strap  - 1 x daily - 7 x weekly - 1 sets - 10 reps - Sidelying Thoracic Rotation with Open Book  - 1-2 x daily - 7 x weekly - 1 sets - 10 reps - Doorway Pec Stretch at 90 Degrees Abduction  - 2 x daily - 7 x weekly - 1 sets - 3 reps - 30 seconds hold - Seated Hamstring Stretch  - 1 x daily - 7 x weekly - 2 reps - 20 sec hold - Seated Piriformis Stretch with Trunk Bend  - 1 x daily - 7 x weekly - 2 reps - 20 sec hold - Seated Thoracic Flexion and Rotation with Swiss Ball  - 1 x daily - 7 x weekly - 10 reps - Cat Cow  - 1 x daily - 7 x weekly - 2 sets - 10 reps - Quadruped Rocking Backward  - 1 x daily - 7 x weekly - 2 sets - 10 reps - Quadruped Transversus Abdominis Bracing  - 1 x daily - 7 x weekly - 2 sets - 10 reps - Bicep Stretch at Table  - 1 x daily - 7 x weekly - 1 sets - 3 reps - 30 sec hold - Cervical Diagonals Prone on Elbows  - 1 x daily - 7 x weekly - 2 sets - 10 reps  Patient Education - TENS UNIT - AUVON Dual Channel TENS Unit - TENS Unit ASSESSMENT:  CLINICAL IMPRESSION:  Patient with decreased twitch responses in UT and lower cervical multifidi. She is still very tight in lower thoracic paraspinals and low traps. She continues to demonstrate forward head, so added chin tuck to HEP. Overall  patient reports improvements in pain and stiffness. She was 10 min late today, so treatment shortened. She continues to demonstrate potential for improvement and would benefit from continued skilled therapy to address impairments.    OBJECTIVE IMPAIRMENTS: decreased activity tolerance, decreased ROM, decreased strength, increased muscle spasms, impaired flexibility, postural dysfunction, and pain.   ACTIVITY LIMITATIONS: carrying, lifting, sleeping, reach over head, and hygiene/grooming  PARTICIPATION LIMITATIONS: meal prep, cleaning, laundry, driving, occupation, and yard work  PERSONAL FACTORS: Profession and 1 comorbidity: chronic shoulder and LBP are also affecting patient's functional outcome.   REHAB POTENTIAL: Excellent  CLINICAL DECISION MAKING: Evolving/moderate complexity  EVALUATION COMPLEXITY: Moderate   GOALS: Goals reviewed with patient? Yes  SHORT TERM GOALS: Target date: 03/31/2024   Patient will be independent with initial HEP.  Baseline:  Goal status: Met on 03/09/24  2.  Decreased pain in the neck and shoulders by 50% with ADLs  Baseline: 25% (03/18/24) Goal status: Met on 04/15/24 (reports 75% better)    LONG TERM GOALS: Target date: 06/18/2024   Patient will be independent with advanced/ongoing HEP to improve outcomes and carryover.  Baseline:  Goal status: Ongoing  2.  Patient will report 85% improvement in neck pain to improve QOL.  Baseline:  Goal status: Ongoing (reports 75% improvement on 04/15/24)  3.  Patient will demonstrate full pain free cervical ROM for safety with driving.  Baseline:  Goal status: Met on 04/26/24   4.   Patient will report 3/50 on NDI to demonstrate improved functional ability.  Baseline: 13/50 Goal status:  Ongoing (see above)    PLAN:  PT FREQUENCY: 1-2x/week  PT DURATION: 8 weeks  PLANNED INTERVENTIONS: 97164- PT Re-evaluation, 97750- Physical Performance Testing, 97110-Therapeutic exercises, 97530- Therapeutic  activity, [id number]- Neuromuscular re-education, 97535- Self Care, [id number]- Manual therapy, [id number]- Gait training, [phone]- Canalith repositioning, [id number]- Aquatic Therapy, [phone]- Electrical stimulation (unattended), [phone]- Electrical stimulation (manual), [id number]- Vasopneumatic device, [id number]- Ultrasound, [id number]- Traction (mechanical), [id number]- Ionotophoresis 4mg /ml Dexamethasone , [id number] (1-2 muscles), [id number] (3+ muscles)- Dry Needling, Patient/Family education, Balance training, Stair training, Taping, Joint mobilization, Spinal manipulation, Spinal mobilization, Vestibular training, Cryotherapy, and Moist heat.  PLAN FOR NEXT SESSION:  assess DN, and response to chin tucks  cervical ROM, postural strengthening, manual/dry needling as needed   Mliss Cummins, PT  05/05/24, 5:05 PM  [facility] [address], [address] [place], [place] [address] Phone # [phone] Fax [phone]

## 2024-05-11 ENCOUNTER — Ambulatory Visit (INDEPENDENT_AMBULATORY_CARE_PROVIDER_SITE_OTHER)

## 2024-05-11 DIAGNOSIS — J309 Allergic rhinitis, unspecified: Secondary | ICD-10-CM

## 2024-05-18 ENCOUNTER — Ambulatory Visit

## 2024-05-18 DIAGNOSIS — M5416 Radiculopathy, lumbar region: Secondary | ICD-10-CM

## 2024-05-18 DIAGNOSIS — R293 Abnormal posture: Secondary | ICD-10-CM

## 2024-05-18 DIAGNOSIS — R262 Difficulty in walking, not elsewhere classified: Secondary | ICD-10-CM

## 2024-05-18 DIAGNOSIS — M6281 Muscle weakness (generalized): Secondary | ICD-10-CM

## 2024-05-18 DIAGNOSIS — R252 Cramp and spasm: Secondary | ICD-10-CM

## 2024-05-18 DIAGNOSIS — M542 Cervicalgia: Secondary | ICD-10-CM

## 2024-05-18 NOTE — Therapy (Signed)
 OUTPATIENT PHYSICAL THERAPY TREATMENT NOTE   Patient Name: Valerie [name] MRN: [id number] DOB:May 07, 1966, 58 y.o., female Today's Date: 05/18/2024  END OF SESSION:  PT End of Session - 05/18/24 1628     Visit Number 13    Date for Recertification  06/18/24    Authorization Type [facility] [employer]    PT Start Time 1623    PT Stop Time 1720    PT Time Calculation (min) 57 min    Activity Tolerance Patient tolerated treatment well    Behavior During Therapy [facility] for tasks assessed/performed               Past Medical History:  Diagnosis Date   Allergy    cats   Anemia    NOS   Cataract    Female fertility problem    hx of treatment   GERD (gastroesophageal reflux disease)    HSV infection    labialis   RECTAL BLEEDING 07/10/2007   Qualifier: Diagnosis of  By: Charlett MD, Apolinar POUR 2008 colonoscopy [place].    Past Surgical History:  Procedure Laterality Date   CYST REMOVAL HAND Right    DILITATION & CURRETTAGE/HYSTROSCOPY WITH VERSAPOINT RESECTION  08/27/2012   Procedure: DILATATION & CURETTAGE/HYSTEROSCOPY WITH VERSAPOINT RESECTION;  Surgeon: Marie-Lyne Lavoie, MD;  Location: [facility];  Service: Gynecology;;   EYE SURGERY  Lasik   LASIK  2009   mono vision   TRIGGER FINGER RELEASE Right 06/02/2017   Procedure: RELEASE TRIGGER FINGER/A-1 PULLEY RIGHT RING TRIGGER RELEASE;  Surgeon: Murrell Drivers, MD;  Location: [facility];  Service: Orthopedics;  Laterality: Right;   Patient Active Problem List   Diagnosis Date Noted   Genetic testing 03/01/2024   Well woman exam with routine gynecological exam 10/29/2023   Bicipital tendinitis of right shoulder 02/16/2020   Myofascial muscle pain 11/08/2019   Chronic bilateral thoracic back pain 11/08/2019   Pain of both shoulder joints 09/21/2015   Impingement syndrome of right shoulder 03/08/2015   Trigger point of right shoulder region 03/08/2015   Low back pain 03/16/2014   Wrist pain, right 02/09/2014    Nonallopathic lesion of cervical region 11/03/2013   Nonallopathic lesion of thoracic region 11/03/2013   Nonallopathic lesion-rib cage 11/03/2013   Nonallopathic lesion of lumbosacral region 11/03/2013   Nonallopathic lesion of sacral region 11/03/2013   Imbalance 10/27/2012   Neck pain 10/27/2012   Headache 07/15/2012   Abdominal cramps 02/25/2012   Abnormal stools 02/25/2012   Delayed menses 02/25/2012   Visit for preventive health examination 12/03/2011   Abdominal bloating 12/03/2011   DYSESTHESIA 09/20/2009   ARREST OF BONE DEVELOPMENT OR GROWTH 07/24/2007   DERMATOPHYTOSIS OF NAIL 07/10/2007   ALLERGIC RHINITIS 07/10/2007   GERD 07/10/2007   BLOOD IN STOOL 07/10/2007   HERPES LABIALIS, HX OF 07/10/2007    PCP: Charlett Apolinar POUR, MD   REFERRING PROVIDER: [place], Betty G, MD    REFERRING DIAG:  M54.9,G89.29 (ICD-10-CM) - Upper back pain, chronic  M54.50,G89.29 (ICD-10-CM) - Chronic right-sided low back pain, unspecified whether sciatica present    Rationale for Evaluation and Treatment: Rehabilitation  THERAPY DIAG:  Radiculopathy, lumbar region  Cervicalgia  Muscle weakness (generalized)  Difficulty in walking, not elsewhere classified  Cramp and spasm  Abnormal posture  ONSET DATE: 3 weeks ago  SUBJECTIVE:  SUBJECTIVE STATEMENT: Patient reports she is still having the upper trap and rhomboid pain.  Woke up this morning with a lot of stiffness and a bad headache.    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 Yes: NPRS scale: 4/10 Pain location: thoracic/shoulder Pain description: ache tight twisting Aggravating factors: stress, gardening, cleaning, carrying  Relieving factors: massage, heat,  tylenol , ice  Are you having pain? Yes: NPRS scale: 0/10 Pain location: low back Pain description: achy Aggravating factors: prolonged sitting, car 2 hours Relieving factors: standing  PRECAUTIONS: None  RED FLAGS: None   WEIGHT BEARING RESTRICTIONS: No  FALLS:  Has patient fallen in last 6 months? Yes. 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  DIAGNOSTIC FINDINGS:  2019 MRI IMPRESSION: 1. At L4-5 there is a mild broad-based disc bulge. Mild bilateral facet arthropathy and bilateral lateral recess narrowing. 2. At L5-S1 there is a broad-based disc bulge with a small central disc protrusion.  PATIENT SURVEYS:  Eval: NDI  13 / 50 = 26.0 % Quick dash 20.5 / 100 = 20.5 %  04/20/2024: Neck Disability Index:  6/50 = 12 % Quick DASH:  11.4/100 = 11.4 %  04/26/2024: Quick DASH:  9.09%   COGNITION: Overall cognitive status: Within functional limits for tasks assessed    MUSCLE LENGTH: Tight UT, pectoralis, cervical paraspinals  POSTURE: rounded shoulders and forward head  PALPATION: TTP in B UT, SO, paraspinals, pecs Spinal mobiliy decreased in mid thoracic   CERVICAL ROM:  rotation 40 R  30 L pain turning L into R upper arm, flexion full pain down into thoracic spin Lateral flex R 23   L 30 pain into L shoulder  03/18/24:  Cervical sidebending: Lt 35 Rt 48. Rotation: Rt: 65   Lt 65  UE ROM: Full B except R ABD 160 (full PROM)  04/26/2024: Cervical flexion: 70 deg Cervical extension:  40 deg Cervical right sidebending: 40 deg Cervical left sidebending: 45 deg Cervical right rotation:  60 deg Cervical left rotation:  60  deg  LUMBAR ROM: Limited with R rotation 25% feels in R hip also with R SB, else ROM WNL  Negative cervical special tests    TREATMENT DATE:                                                                                                                               05/18/2024: Nustep x 5 min level 5 (PT present to discuss status and progress toward goals) Prone wing taps and reach and pull x 10  Prone reach and pull x 10 Side lying open book x 10 each side Prone shoulder ext, row and horizontal abduction with 2 lbs 2 x 10 each both Side lying shoulder ER with 2 lbs 2 x 10 both Supine serratus punch with 2 lbs 2 x 10 both Trigger Point Dry Needling Subsequent Treatment: Instructions provided previously at initial dry needling treatment.  Patient Verbal Consent Given: Yes Education Handout Provided: Previously Provided Muscles Treated: B UT, LS, cervical multifidi, B rhomboids, low traps and thoracic paraspinals Electrical Stimulation Performed: No Treatment Response/Outcome: Utilized skilled palpation to identify bony landmarks and trigger points.  Able to illicit twitch response and muscle elongation.  Deep Soft tissue mobilization following to further promote tissue elongation.  05/05/2024: Standing 3 way scapular stabilization with yellow loop 2x5 bilat B then did 2x5 with blue (better form and ROM and less compensation in trunk) Standing rows and ext green 2x10 ea Seated chin tucks x 10 Trigger Point Dry Needling Subsequent Treatment: Instructions provided previously at initial dry needling treatment.  Patient Verbal Consent Given: Yes Education Handout Provided: Previously Provided Muscles Treated: B UT, LS, cervical multifidi, B rhomboids, low traps and thoracic paraspinals Electrical Stimulation Performed: No Treatment Response/Outcome: Utilized skilled palpation to identify bony landmarks and trigger points.  Able to illicit twitch response and muscle elongation.  Deep Soft  tissue mobilization following to further promote tissue elongation.    04/28/2024: Nustep level 5 x6 min with PT present to discuss status and monitor form Seated UT stretch 2x30 sec B Seated Levator stretch 2x30 sec B Standing rows and ext green 2x10 ea Trigger Point Dry Needling Subsequent Treatment: Instructions provided previously at initial dry needling treatment.  Patient Verbal Consent Given: Yes Education Handout Provided: Previously Provided Muscles Treated: B UT, LS, cervical multifidi, B rhomboids Electrical Stimulation Performed: No Treatment Response/Outcome: Utilized skilled palpation to identify bony landmarks and trigger points.  Able to illicit twitch response and muscle elongation.  Deep Soft tissue mobilization following to further promote tissue elongation.      PATIENT EDUCATION:  Education details: PT eval findings, anticipated POC, initial HEP, postural awareness, and role of TPDN Educated on how weak rotator cuff musculature would result in upper trap and levator compensation   Person educated: Patient Education method: Explanation, Demonstration, Tactile cues, Verbal cues, and Handouts Education comprehension: verbalized understanding and returned demonstration  HOME EXERCISE PROGRAM: Access Code: [email or web address] URL: [email or web address] Date: 04/28/2024 Prepared by: Mliss  Exercises - Seated Cervical Retraction  - 2 x daily - 7 x weekly - 1 sets - 10 reps - 5 hold - Seated Cervical Sidebending AROM  - 1 x daily - 7 x weekly - 1 sets - 10 reps - 5 hold - Seated Assisted Cervical Rotation with Towel  - 1 x daily - 7 x weekly - 1 sets - 10 reps - Cervical Extension AROM with Strap  - 1 x daily - 7 x weekly - 1 sets - 10 reps - Sidelying Thoracic Rotation with Open Book  - 1-2 x daily - 7 x weekly - 1 sets - 10 reps - Doorway Pec Stretch at 90 Degrees Abduction  - 2 x daily - 7 x weekly - 1 sets - 3 reps - 30 seconds hold - Seated Hamstring Stretch   - 1 x daily - 7 x weekly - 2 reps - 20 sec hold - Seated Piriformis Stretch with Trunk Bend  - 1 x daily - 7 x weekly - 2 reps -  20 sec hold - Seated Thoracic Flexion and Rotation with Swiss Ball  - 1 x daily - 7 x weekly - 10 reps - Cat Cow  - 1 x daily - 7 x weekly - 2 sets - 10 reps - Quadruped Rocking Backward  - 1 x daily - 7 x weekly - 2 sets - 10 reps - Quadruped Transversus Abdominis Bracing  - 1 x daily - 7 x weekly - 2 sets - 10 reps - Bicep Stretch at Table  - 1 x daily - 7 x weekly - 1 sets - 3 reps - 30 sec hold - Cervical Diagonals Prone on Elbows  - 1 x daily - 7 x weekly - 2 sets - 10 reps  Patient Education - TENS UNIT - AUVON Dual Channel TENS Unit - TENS Unit ASSESSMENT:  CLINICAL IMPRESSION:  Patient is responding well to DN but symptoms seem to reoccur after a few days.  We discussed all history of the neck and shoulder and she states she has had injections in the right shoulder in the past.  We added in some shoulder and scapular stabilization today to see if this would reduce her symptoms.  She was able to complete most of these with min fatigue but struggled with horizontal abduction.   She continues to demonstrate potential for improvement and would benefit from continued skilled therapy to address impairments.    OBJECTIVE IMPAIRMENTS: decreased activity tolerance, decreased ROM, decreased strength, increased muscle spasms, impaired flexibility, postural dysfunction, and pain.   ACTIVITY LIMITATIONS: carrying, lifting, sleeping, reach over head, and hygiene/grooming  PARTICIPATION LIMITATIONS: meal prep, cleaning, laundry, driving, occupation, and yard work  PERSONAL FACTORS: Profession and 1 comorbidity: chronic shoulder and LBP are also affecting patient's functional outcome.   REHAB POTENTIAL: Excellent  CLINICAL DECISION MAKING: Evolving/moderate complexity  EVALUATION COMPLEXITY: Moderate   GOALS: Goals reviewed with patient? Yes  SHORT TERM GOALS:  Target date: 03/31/2024   Patient will be independent with initial HEP.  Baseline:  Goal status: Met on 03/09/24  2.  Decreased pain in the neck and shoulders by 50% with ADLs  Baseline: 25% (03/18/24) Goal status: Met on 04/15/24 (reports 75% better)    LONG TERM GOALS: Target date: 06/18/2024   Patient will be independent with advanced/ongoing HEP to improve outcomes and carryover.  Baseline:  Goal status: Ongoing  2.  Patient will report 85% improvement in neck pain to improve QOL.  Baseline:  Goal status: Ongoing (reports 75% improvement on 04/15/24)  3.  Patient will demonstrate full pain free cervical ROM for safety with driving.  Baseline:  Goal status: Met on 04/26/24   4.   Patient will report 3/50 on NDI to demonstrate improved functional ability.  Baseline: 13/50 Goal status: Ongoing (see above)    PLAN:  PT FREQUENCY: 1-2x/week  PT DURATION: 8 weeks  PLANNED INTERVENTIONS: 97164- PT Re-evaluation, 97750- Physical Performance Testing, 97110-Therapeutic exercises, 97530- Therapeutic activity, [id number]- Neuromuscular re-education, 97535- Self Care, [id number]- Manual therapy, [id number]- Gait training, [phone]- Canalith repositioning, [id number]- Aquatic Therapy, [phone]- Electrical stimulation (unattended), [phone]- Electrical stimulation (manual), [id number]- Vasopneumatic device, [id number]- Ultrasound, [id number]- Traction (mechanical), [id number]- Ionotophoresis 4mg /ml Dexamethasone , [id number] (1-2 muscles), [id number] (3+ muscles)- Dry Needling, Patient/Family education, Balance training, Stair training, Taping, Joint mobilization, Spinal manipulation, Spinal mobilization, Vestibular training, Cryotherapy, and Moist heat.  PLAN FOR NEXT SESSION:  assess response to new shoulder exercises,  DN,  cervical ROM, postural strengthening, manual/dry needling as needed   Denys Labree B. Nari Vannatter, PT 05/18/24 10:10  PM [facility] [address], [address] [place], [place] [address] Phone #  [phone] Fax [phone]

## 2024-05-20 ENCOUNTER — Ambulatory Visit (INDEPENDENT_AMBULATORY_CARE_PROVIDER_SITE_OTHER)

## 2024-05-20 DIAGNOSIS — J309 Allergic rhinitis, unspecified: Secondary | ICD-10-CM

## 2024-05-25 ENCOUNTER — Encounter

## 2024-05-27 ENCOUNTER — Ambulatory Visit

## 2024-05-27 ENCOUNTER — Encounter

## 2024-05-27 ENCOUNTER — Ambulatory Visit: Attending: Family Medicine

## 2024-05-27 DIAGNOSIS — M542 Cervicalgia: Secondary | ICD-10-CM | POA: Insufficient documentation

## 2024-05-27 DIAGNOSIS — J309 Allergic rhinitis, unspecified: Secondary | ICD-10-CM | POA: Diagnosis not present

## 2024-05-27 DIAGNOSIS — R293 Abnormal posture: Secondary | ICD-10-CM | POA: Insufficient documentation

## 2024-05-27 DIAGNOSIS — M5416 Radiculopathy, lumbar region: Secondary | ICD-10-CM | POA: Insufficient documentation

## 2024-05-27 DIAGNOSIS — R252 Cramp and spasm: Secondary | ICD-10-CM | POA: Diagnosis not present

## 2024-05-27 DIAGNOSIS — R262 Difficulty in walking, not elsewhere classified: Secondary | ICD-10-CM | POA: Insufficient documentation

## 2024-05-27 DIAGNOSIS — M6281 Muscle weakness (generalized): Secondary | ICD-10-CM | POA: Insufficient documentation

## 2024-05-27 NOTE — Therapy (Signed)
 OUTPATIENT PHYSICAL THERAPY TREATMENT NOTE   Patient Name: Valerie [name] MRN: [id number] DOB:02/03/1966, 58 y.o., female Today's Date: 05/27/2024  END OF SESSION:  PT End of Session - 05/27/24 0816     Visit Number 14    Date for Recertification  06/18/24    Authorization Type [facility] [employer]    PT Start Time 0732    PT Stop Time 0805    PT Time Calculation (min) 33 min    Activity Tolerance Patient tolerated treatment well    Behavior During Therapy [facility] for tasks assessed/performed                Past Medical History:  Diagnosis Date   Allergy    cats   Anemia    NOS   Cataract    Female fertility problem    hx of treatment   GERD (gastroesophageal reflux disease)    HSV infection    labialis   RECTAL BLEEDING 07/10/2007   Qualifier: Diagnosis of  By: Charlett MD, Apolinar POUR 2008 colonoscopy [place].    Past Surgical History:  Procedure Laterality Date   CYST REMOVAL HAND Right    DILITATION & CURRETTAGE/HYSTROSCOPY WITH VERSAPOINT RESECTION  08/27/2012   Procedure: DILATATION & CURETTAGE/HYSTEROSCOPY WITH VERSAPOINT RESECTION;  Surgeon: Marie-Lyne Lavoie, MD;  Location: [facility];  Service: Gynecology;;   EYE SURGERY  Lasik   LASIK  2009   mono vision   TRIGGER FINGER RELEASE Right 06/02/2017   Procedure: RELEASE TRIGGER FINGER/A-1 PULLEY RIGHT RING TRIGGER RELEASE;  Surgeon: Murrell Drivers, MD;  Location: [facility];  Service: Orthopedics;  Laterality: Right;   Patient Active Problem List   Diagnosis Date Noted   Genetic testing 03/01/2024   Well woman exam with routine gynecological exam 10/29/2023   Bicipital tendinitis of right shoulder 02/16/2020   Myofascial muscle pain 11/08/2019   Chronic bilateral thoracic back pain 11/08/2019   Pain of both shoulder joints 09/21/2015   Impingement syndrome of right shoulder 03/08/2015   Trigger point of right shoulder region 03/08/2015   Low back pain 03/16/2014   Wrist pain, right 02/09/2014    Nonallopathic lesion of cervical region 11/03/2013   Nonallopathic lesion of thoracic region 11/03/2013   Nonallopathic lesion-rib cage 11/03/2013   Nonallopathic lesion of lumbosacral region 11/03/2013   Nonallopathic lesion of sacral region 11/03/2013   Imbalance 10/27/2012   Neck pain 10/27/2012   Headache 07/15/2012   Abdominal cramps 02/25/2012   Abnormal stools 02/25/2012   Delayed menses 02/25/2012   Visit for preventive health examination 12/03/2011   Abdominal bloating 12/03/2011   DYSESTHESIA 09/20/2009   ARREST OF BONE DEVELOPMENT OR GROWTH 07/24/2007   DERMATOPHYTOSIS OF NAIL 07/10/2007   ALLERGIC RHINITIS 07/10/2007   GERD 07/10/2007   BLOOD IN STOOL 07/10/2007   HERPES LABIALIS, HX OF 07/10/2007    PCP: Charlett Apolinar POUR, MD   REFERRING PROVIDER: [place], Betty G, MD    REFERRING DIAG:  M54.9,G89.29 (ICD-10-CM) - Upper back pain, chronic  M54.50,G89.29 (ICD-10-CM) - Chronic right-sided low back pain, unspecified whether sciatica present    Rationale for Evaluation and Treatment: Rehabilitation  THERAPY DIAG:  Radiculopathy, lumbar region  Cervicalgia  Muscle weakness (generalized)  Difficulty in walking, not elsewhere classified  Cramp and spasm  Abnormal posture  ONSET DATE: 3 weeks ago  SUBJECTIVE:  SUBJECTIVE STATEMENT: I feel 75% better most days.  I have been doing most of my exercises.      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 Yes: NPRS scale: 4/10 Rt shoulder, 0/10 Lt Pain location: thoracic/shoulder Pain description: ache tight twisting Aggravating factors: stress, gardening, cleaning, carrying  Relieving factors: massage, heat, tylenol , ice  Are you having pain? Yes:  NPRS scale: 0/10 Pain location: low back Pain description: achy Aggravating factors: prolonged sitting, car 2 hours Relieving factors: standing  PRECAUTIONS: None  RED FLAGS: None   WEIGHT BEARING RESTRICTIONS: No  FALLS:  Has patient fallen in last 6 months? Yes. 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  DIAGNOSTIC FINDINGS:  2019 MRI IMPRESSION: 1. At L4-5 there is a mild broad-based disc bulge. Mild bilateral facet arthropathy and bilateral lateral recess narrowing. 2. At L5-S1 there is a broad-based disc bulge with a small central disc protrusion.  PATIENT SURVEYS:  Eval: NDI  13 / 50 = 26.0 % Quick dash 20.5 / 100 = 20.5 %  04/20/2024: Neck Disability Index:  6/50 = 12 % Quick DASH:  11.4/100 = 11.4 %  04/26/2024: Quick DASH:  9.09%   COGNITION: Overall cognitive status: Within functional limits for tasks assessed    MUSCLE LENGTH: Tight UT, pectoralis, cervical paraspinals  POSTURE: rounded shoulders and forward head  PALPATION: TTP in B UT, SO, paraspinals, pecs Spinal mobiliy decreased in mid thoracic   CERVICAL ROM:  rotation 40 R  30 L pain turning L into R upper arm, flexion full pain down into thoracic spin Lateral flex R 23   L 30 pain into L shoulder  03/18/24:  Cervical sidebending: Lt 35 Rt 48. Rotation: Rt: 65   Lt 65  UE ROM: Full B except R ABD 160 (full PROM)  04/26/2024: Cervical flexion: 70 deg Cervical extension:  40 deg Cervical right sidebending: 40 deg Cervical left sidebending: 45 deg Cervical right rotation:  60 deg Cervical left rotation:  60 deg  LUMBAR ROM: Limited with R rotation  25% feels in R hip also with R SB, else ROM WNL  Negative cervical special tests    TREATMENT DATE:                                                                                                                               05/27/2024: Nustep x 5 min level 5 (PT present to discuss status and progress toward goals) Prone reach and pull x 10 Side lying open book x 10 each side Prone  shoulder ext, row and horizontal abduction with 2 lbs 2 x 10 each both Side lying shoulder ER with 2 lbs 2 x 10 both Supine serratus punch with 2 lbs 2 x 10 both Manual: PA mobs T2-8 grade 2-3 and rotational mobs T4-6   05/18/2024: Nustep x 5 min level 5 (PT present to discuss status and progress toward goals) Prone wing taps and reach and pull x 10  Prone reach and pull x 10 Side lying open book x 10 each side Prone shoulder ext, row and horizontal abduction with 2 lbs 2 x 10 each both Side lying shoulder ER with 2 lbs 2 x 10 both Supine serratus punch with 2 lbs 2 x 10 both Trigger Point Dry Needling Subsequent Treatment: Instructions provided previously at initial dry needling treatment.  Patient Verbal Consent Given: Yes Education Handout Provided: Previously Provided Muscles Treated: B UT, LS, cervical multifidi, B rhomboids, low traps and thoracic paraspinals Electrical Stimulation Performed: No Treatment Response/Outcome: Utilized skilled palpation to identify bony landmarks and trigger points.  Able to illicit twitch response and muscle elongation.  Deep Soft tissue mobilization following to further promote tissue elongation.  05/05/2024: Standing 3 way scapular stabilization with yellow loop 2x5 bilat B then did 2x5 with blue (better form and ROM and less compensation in trunk) Standing rows and ext green 2x10 ea Seated chin tucks x 10 Trigger Point Dry Needling Subsequent Treatment: Instructions provided previously at initial dry needling treatment.  Patient Verbal Consent Given:  Yes Education Handout Provided: Previously Provided Muscles Treated: B UT, LS, cervical multifidi, B rhomboids, low traps and thoracic paraspinals Electrical Stimulation Performed: No Treatment Response/Outcome: Utilized skilled palpation to identify bony landmarks and trigger points.  Able to illicit twitch response and muscle elongation.  Deep Soft tissue mobilization following to further promote tissue elongation.    PATIENT EDUCATION:  Education details: PT eval findings, anticipated POC, initial HEP, postural awareness, and role of TPDN Educated on how weak rotator cuff musculature would result in upper trap and levator compensation   Person educated: Patient Education method: Explanation, Demonstration, Tactile cues, Verbal cues, and Handouts Education comprehension: verbalized understanding and returned demonstration  HOME EXERCISE PROGRAM: Access Code: [email or web address] URL: [email or web address] Date: 05/27/2024 Prepared by: Burnard  Exercises - Seated Cervical Retraction  - 2 x daily - 7 x weekly - 1 sets - 10 reps - 5 hold - Seated Cervical Sidebending AROM  - 1 x daily - 7 x weekly - 1 sets - 10 reps - 5 hold - Seated Assisted Cervical Rotation with Towel  - 1 x daily - 7 x weekly - 1 sets - 10 reps - Cervical Extension AROM with Strap  - 1 x daily - 7 x weekly - 1 sets - 10 reps - Sidelying Thoracic Rotation with Open Book  - 1-2 x daily - 7 x weekly - 1 sets - 10 reps - Doorway Pec Stretch at 90 Degrees Abduction  - 2 x daily - 7 x weekly - 1 sets - 3 reps - 30 seconds hold - Seated Hamstring Stretch  - 1 x daily - 7 x weekly - 2 reps - 20 sec hold - Seated Piriformis Stretch with Trunk Bend  - 1 x daily - 7 x weekly - 2 reps - 20 sec hold - Seated Thoracic Flexion and Rotation with Swiss Ball  - 1 x daily - 7 x weekly - 10 reps - Cat Cow  - 1 x daily - 7 x weekly -  2 sets - 10 reps - Quadruped Rocking Backward  - 1 x daily - 7 x weekly - 2 sets - 10 reps - Quadruped  Transversus Abdominis Bracing  - 1 x daily - 7 x weekly - 2 sets - 10 reps - Bicep Stretch at Table  - 1 x daily - 7 x weekly - 1 sets - 3 reps - 30 sec hold - Cervical Diagonals Prone on Elbows  - 1 x daily - 7 x weekly - 2 sets - 10 reps - Prone Shoulder Extension - Single Arm  - 2 x daily - 7 x weekly - 2 sets - 10 reps - Prone Shoulder Row  - 2 x daily - 7 x weekly - 2 sets - 10 reps - Prone Single Arm Shoulder Horizontal Abduction with Scapular Retraction and Palm Down  - 2 x daily - 7 x weekly - 2 sets - 10 reps - Sidelying Shoulder External Rotation  - 2 x daily - 7 x weekly - 2 sets - 10 reps - Single Arm Serratus Punches in Supine with Dumbbell  - 2 x daily - 7 x weekly - 2 sets - 10 reps  Patient Education - TENS UNIT - AUVON Dual Channel TENS Unit - TENS Unit ASSESSMENT:  CLINICAL IMPRESSION:  Pt continues to report 75% overall improvement on most days.  Symptoms are localized to the Rt scapular region and upper trap.  Pt is making modifications at home and work with reduced lifting with Rt UE.  Pt has been compliant with HEP and  reports increased ease on the Lt UE with exercises. She reports improved endurance with postural stabilization tasks.  She was able to complete most of these with min fatigue but struggled with horizontal abduction.  Reduced PA mobility in thoracic spine with pain with mobs.  PT educated pt regarding importance of flexibility and postural strength to address her pain.   She continues to demonstrate potential for improvement and would benefit from continued skilled therapy to address impairments.    OBJECTIVE IMPAIRMENTS: decreased activity tolerance, decreased ROM, decreased strength, increased muscle spasms, impaired flexibility, postural dysfunction, and pain.   ACTIVITY LIMITATIONS: carrying, lifting, sleeping, reach over head, and hygiene/grooming  PARTICIPATION LIMITATIONS: meal prep, cleaning, laundry, driving, occupation, and yard work  PERSONAL  FACTORS: Profession and 1 comorbidity: chronic shoulder and LBP are also affecting patient's functional outcome.   REHAB POTENTIAL: Excellent  CLINICAL DECISION MAKING: Evolving/moderate complexity  EVALUATION COMPLEXITY: Moderate   GOALS: Goals reviewed with patient? Yes  SHORT TERM GOALS: Target date: 03/31/2024   Patient will be independent with initial HEP.  Baseline:  Goal status: Met on 03/09/24  2.  Decreased pain in the neck and shoulders by 50% with ADLs  Baseline: 25% (03/18/24) Goal status: Met on 04/15/24 (reports 75% better)    LONG TERM GOALS: Target date: 06/18/2024   Patient will be independent with advanced/ongoing HEP to improve outcomes and carryover.  Baseline:  Goal status: Ongoing  2.  Patient will report 85% improvement in neck pain to improve QOL.  Baseline:  Goal status: Ongoing (reports 75% improvement on 04/15/24)  3.  Patient will demonstrate full pain free cervical ROM for safety with driving.  Baseline:  Goal status: Met on 04/26/24   4.   Patient will report 3/50 on NDI to demonstrate improved functional ability.  Baseline: 13/50 Goal status: Ongoing (see above)    PLAN:  PT FREQUENCY: 1-2x/week  PT DURATION: 8 weeks  PLANNED  INTERVENTIONS: 97164- PT Re-evaluation, 97750- Physical Performance Testing, 97110-Therapeutic exercises, 97530- Therapeutic activity, [id number]- Neuromuscular re-education, 97535- Self Care, 02859- Manual therapy, [phone]- Gait training, [phone]- Canalith repositioning, [id number]- Aquatic Therapy, [phone]- Electrical stimulation (unattended), [phone]- Electrical stimulation (manual), [id number]- Vasopneumatic device, [id number]- Ultrasound, [id number]- Traction (mechanical), [id number]- Ionotophoresis 4mg /ml Dexamethasone , [id number] (1-2 muscles), [id number] (3+ muscles)- Dry Needling, Patient/Family education, Balance training, Stair training, Taping, Joint mobilization, Spinal manipulation, Spinal mobilization, Vestibular training, Cryotherapy, and Moist  heat.  PLAN FOR NEXT SESSION:  assess response to mobs, continue postural strength,  cervical ROM, postural strengthening, manual/dry needling as needed   Burnard Joy, PT 05/27/24 8:17 AM  [facility] [address], [address] [place], [place] [address] Phone # [phone] Fax [phone]

## 2024-06-01 ENCOUNTER — Ambulatory Visit: Admitting: Rehabilitative and Restorative Service Providers"

## 2024-06-01 ENCOUNTER — Encounter: Payer: Self-pay | Admitting: Rehabilitative and Restorative Service Providers"

## 2024-06-01 DIAGNOSIS — M542 Cervicalgia: Secondary | ICD-10-CM | POA: Diagnosis not present

## 2024-06-01 DIAGNOSIS — R262 Difficulty in walking, not elsewhere classified: Secondary | ICD-10-CM

## 2024-06-01 DIAGNOSIS — M6281 Muscle weakness (generalized): Secondary | ICD-10-CM | POA: Diagnosis not present

## 2024-06-01 DIAGNOSIS — R252 Cramp and spasm: Secondary | ICD-10-CM

## 2024-06-01 DIAGNOSIS — M5416 Radiculopathy, lumbar region: Secondary | ICD-10-CM

## 2024-06-01 DIAGNOSIS — R293 Abnormal posture: Secondary | ICD-10-CM

## 2024-06-01 NOTE — Therapy (Signed)
 OUTPATIENT PHYSICAL THERAPY TREATMENT NOTE   Patient Name: Valerie [name] MRN: [id number] DOB:1966/01/05, 58 y.o., female Today's Date: 06/01/2024  END OF SESSION:  PT End of Session - 06/01/24 0738     Visit Number 15    Date for Recertification  06/18/24    Authorization Type [facility] [employer]    PT Start Time [phone]    PT Stop Time 0811    PT Time Calculation (min) 38 min    Activity Tolerance Patient tolerated treatment well    Behavior During Therapy [facility] for tasks assessed/performed                Past Medical History:  Diagnosis Date   Allergy    cats   Anemia    NOS   Cataract    Female fertility problem    hx of treatment   GERD (gastroesophageal reflux disease)    HSV infection    labialis   RECTAL BLEEDING 07/10/2007   Qualifier: Diagnosis of  By: Charlett MD, Apolinar POUR 2008 colonoscopy [place].    Past Surgical History:  Procedure Laterality Date   CYST REMOVAL HAND Right    DILITATION & CURRETTAGE/HYSTROSCOPY WITH VERSAPOINT RESECTION  08/27/2012   Procedure: DILATATION & CURETTAGE/HYSTEROSCOPY WITH VERSAPOINT RESECTION;  Surgeon: Marie-Lyne Lavoie, MD;  Location: [facility];  Service: Gynecology;;   EYE SURGERY  Lasik   LASIK  2009   mono vision   TRIGGER FINGER RELEASE Right 06/02/2017   Procedure: RELEASE TRIGGER FINGER/A-1 PULLEY RIGHT RING TRIGGER RELEASE;  Surgeon: Murrell Drivers, MD;  Location: [facility];  Service: Orthopedics;  Laterality: Right;   Patient Active Problem List   Diagnosis Date Noted   Genetic testing 03/01/2024   Well woman exam with routine gynecological exam 10/29/2023   Bicipital tendinitis of right shoulder 02/16/2020   Myofascial muscle pain 11/08/2019   Chronic bilateral thoracic back pain 11/08/2019   Pain of both shoulder joints 09/21/2015   Impingement syndrome of right shoulder 03/08/2015   Trigger point of right shoulder region 03/08/2015   Low back pain 03/16/2014   Wrist pain, right 02/09/2014    Nonallopathic lesion of cervical region 11/03/2013   Nonallopathic lesion of thoracic region 11/03/2013   Nonallopathic lesion-rib cage 11/03/2013   Nonallopathic lesion of lumbosacral region 11/03/2013   Nonallopathic lesion of sacral region 11/03/2013   Imbalance 10/27/2012   Neck pain 10/27/2012   Headache 07/15/2012   Abdominal cramps 02/25/2012   Abnormal stools 02/25/2012   Delayed menses 02/25/2012   Visit for preventive health examination 12/03/2011   Abdominal bloating 12/03/2011   DYSESTHESIA 09/20/2009   ARREST OF BONE DEVELOPMENT OR GROWTH 07/24/2007   DERMATOPHYTOSIS OF NAIL 07/10/2007   ALLERGIC RHINITIS 07/10/2007   GERD 07/10/2007   BLOOD IN STOOL 07/10/2007   HERPES LABIALIS, HX OF 07/10/2007    PCP: Charlett Apolinar POUR, MD   REFERRING PROVIDER: [place], Betty G, MD    REFERRING DIAG:  M54.9,G89.29 (ICD-10-CM) - Upper back pain, chronic  M54.50,G89.29 (ICD-10-CM) - Chronic right-sided low back pain, unspecified whether sciatica present    Rationale for Evaluation and Treatment: Rehabilitation  THERAPY DIAG:  Radiculopathy, lumbar region  Cervicalgia  Muscle weakness (generalized)  Difficulty in walking, not elsewhere classified  Cramp and spasm  Abnormal posture  ONSET DATE: 3 weeks ago  SUBJECTIVE:  SUBJECTIVE STATEMENT: Pt reports feeling a little tight in her right scapular/rhomboid region this morning.     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 Yes: NPRS scale: 3/10 Pain location: thoracic/shoulder Pain description: ache tight twisting Aggravating factors: stress, gardening, cleaning, carrying  Relieving factors: massage, heat, tylenol , ice   PRECAUTIONS: None  RED  FLAGS: None   WEIGHT BEARING RESTRICTIONS: No  FALLS:  Has patient fallen in last 6 months? Yes. 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  DIAGNOSTIC FINDINGS:  2019 MRI IMPRESSION: 1. At L4-5 there is a mild broad-based disc bulge. Mild bilateral facet arthropathy and bilateral lateral recess narrowing. 2. At L5-S1 there is a broad-based disc bulge with a small central disc protrusion.  PATIENT SURVEYS:  Eval: NDI  13 / 50 = 26.0 % Quick dash 20.5 / 100 = 20.5 %  04/20/2024: Neck Disability Index:  6/50 = 12 % Quick DASH:  11.4/100 = 11.4 %  04/26/2024: Quick DASH:  9.09%   COGNITION: Overall cognitive status: Within functional limits for tasks assessed    MUSCLE LENGTH: Tight UT, pectoralis, cervical paraspinals  POSTURE: rounded shoulders and forward head  PALPATION: TTP in B UT, SO, paraspinals, pecs Spinal mobiliy decreased in mid thoracic   CERVICAL ROM:  rotation 40 R  30 L pain turning L into R upper arm, flexion full pain down into thoracic spin Lateral flex R 23   L 30 pain into L shoulder  03/18/24:  Cervical sidebending: Lt 35 Rt 48. Rotation: Rt: 65   Lt 65  UE ROM: Full B except R ABD 160 (full PROM)  04/26/2024: Cervical flexion: 70 deg Cervical extension:  40 deg Cervical right sidebending: 40 deg Cervical left sidebending: 45 deg Cervical right rotation:  60 deg Cervical left rotation:  60 deg  LUMBAR ROM: Limited with R rotation 25% feels in R hip also with R SB, else ROM WNL  Negative cervical special tests    TREATMENT DATE:                                                                                                                                 06/01/2024: Nustep x 6 min level 5 (PT present to discuss status and progress toward goals) Prone with 2#: shoulder flexion, horizontal abduction, extension, and row.  2x10 each bilat Side lying shoulder ER with 2 lbs 2 x 10 both Supine serratus punch with 2 lbs 2 x 10 both Seated upper trap stretch x20 sec bilat Seated levator stretch x20  sec bilat Seated ulnar nerve stretch (hands in prayer stretch moving side to side) x10 Standing row with twist with 5# cable pulley 2x10 bilat Attempted chop with 5# pulley, but patient could not perform without increased pain Modified plank position on barre performing alt shoulder taps x10   05/27/2024: Nustep x 5 min level 5 (PT present to discuss status and progress toward goals) Prone reach and pull x 10 Side lying open book x 10 each side Prone shoulder ext, row and horizontal abduction with 2 lbs 2 x 10 each both Side lying shoulder ER with 2 lbs 2 x 10 both Supine serratus punch with 2 lbs 2 x 10 both Manual: PA mobs T2-8 grade 2-3 and rotational mobs T4-6   05/18/2024: Nustep x 5 min level 5 (PT present to discuss status and progress toward goals) Prone wing taps and reach and pull x 10  Prone reach and pull x 10 Side lying open book x 10 each side Prone shoulder ext, row and horizontal abduction with 2 lbs 2 x 10 each both Side lying shoulder ER with 2 lbs 2 x 10 both Supine serratus punch with 2 lbs 2 x 10 both Trigger Point Dry Needling Subsequent Treatment: Instructions provided previously at initial dry needling treatment.  Patient Verbal Consent Given: Yes Education Handout Provided: Previously Provided Muscles Treated: B UT, LS, cervical multifidi, B rhomboids, low traps and thoracic paraspinals Electrical Stimulation Performed: No Treatment Response/Outcome: Utilized skilled palpation to identify bony landmarks and trigger points.  Able to illicit twitch response and  muscle elongation.  Deep Soft tissue mobilization following to further promote tissue elongation.    PATIENT EDUCATION:  Education details: PT eval findings, anticipated POC, initial HEP, postural awareness, and role of TPDN Educated on how weak rotator cuff musculature would result in upper trap and levator compensation   Person educated: Patient Education method: Explanation, Demonstration, Tactile cues, Verbal cues, and Handouts Education comprehension: verbalized understanding and returned demonstration  HOME EXERCISE PROGRAM: Access Code: [email or web address] URL: [email or web address] Date: 05/27/2024 Prepared by: Burnard  Exercises - Seated Cervical Retraction  - 2 x daily - 7 x weekly - 1 sets - 10 reps - 5 hold - Seated Cervical Sidebending AROM  - 1 x daily - 7 x weekly - 1 sets - 10 reps - 5 hold - Seated Assisted Cervical Rotation with Towel  - 1 x daily - 7 x weekly - 1 sets - 10 reps - Cervical Extension AROM with Strap  - 1 x daily - 7 x weekly - 1 sets - 10 reps - Sidelying Thoracic Rotation with Open Book  - 1-2 x daily - 7 x weekly - 1 sets - 10 reps - Doorway Pec Stretch at 90 Degrees Abduction  - 2 x daily - 7 x weekly - 1 sets - 3 reps - 30 seconds hold - Seated Hamstring Stretch  - 1 x daily - 7 x weekly - 2 reps - 20 sec hold - Seated Piriformis Stretch with Trunk Bend  - 1 x daily - 7 x weekly - 2 reps - 20 sec hold - Seated Thoracic Flexion and Rotation with Swiss Ball  - 1 x daily - 7 x weekly - 10 reps - Cat Cow  - 1 x daily - 7 x weekly - 2 sets - 10 reps - Quadruped Rocking Backward  - 1 x daily - 7 x weekly - 2 sets - 10 reps - Quadruped Transversus Abdominis Bracing  - 1  x daily - 7 x weekly - 2 sets - 10 reps - Bicep Stretch at Table  - 1 x daily - 7 x weekly - 1 sets - 3 reps - 30 sec hold - Cervical Diagonals Prone on Elbows  - 1 x daily - 7 x weekly - 2 sets - 10 reps - Prone Shoulder Extension - Single Arm  - 2 x daily - 7 x weekly - 2 sets - 10  reps - Prone Shoulder Row  - 2 x daily - 7 x weekly - 2 sets - 10 reps - Prone Single Arm Shoulder Horizontal Abduction with Scapular Retraction and Palm Down  - 2 x daily - 7 x weekly - 2 sets - 10 reps - Sidelying Shoulder External Rotation  - 2 x daily - 7 x weekly - 2 sets - 10 reps - Single Arm Serratus Punches in Supine with Dumbbell  - 2 x daily - 7 x weekly - 2 sets - 10 reps  Patient Education - TENS UNIT - AUVON Dual Channel TENS Unit - TENS Unit  ASSESSMENT:  CLINICAL IMPRESSION:  Ms Mruk presents to skilled PT reporting that she is having a lot of scapular/rhomboid tightness.  Patient continues to progress with strengthening during session, but was unable to perform chop exercise due to strain on shoulder.  Patient educated that she may want to consider a follow up with Dr Claudene, who she has seen in the past for her shoulder/cervical pain, for further work up and she verbalizes understanding.  Additionally, pt verbalizes that she has not yet used her TENS unit.  Further educated patient on the benefits, especially as she continues to have upper trap pain, and she verbalizes understanding that that she will try to utilize it.  Patient continues to require skilled PT to progress towards goal related activities.  OBJECTIVE IMPAIRMENTS: decreased activity tolerance, decreased ROM, decreased strength, increased muscle spasms, impaired flexibility, postural dysfunction, and pain.   ACTIVITY LIMITATIONS: carrying, lifting, sleeping, reach over head, and hygiene/grooming  PARTICIPATION LIMITATIONS: meal prep, cleaning, laundry, driving, occupation, and yard work  PERSONAL FACTORS: Profession and 1 comorbidity: chronic shoulder and LBP are also affecting patient's functional outcome.   REHAB POTENTIAL: Excellent  CLINICAL DECISION MAKING: Evolving/moderate complexity  EVALUATION COMPLEXITY: Moderate   GOALS: Goals reviewed with patient? Yes  SHORT TERM GOALS: Target date:  03/31/2024   Patient will be independent with initial HEP.  Baseline:  Goal status: Met on 03/09/24  2.  Decreased pain in the neck and shoulders by 50% with ADLs  Baseline: 25% (03/18/24) Goal status: Met on 04/15/24 (reports 75% better)    LONG TERM GOALS: Target date: 06/18/2024   Patient will be independent with advanced/ongoing HEP to improve outcomes and carryover.  Baseline:  Goal status: Ongoing  2.  Patient will report 85% improvement in neck pain to improve QOL.  Baseline:  Goal status: Ongoing (reports 75% improvement on 04/15/24)  3.  Patient will demonstrate full pain free cervical ROM for safety with driving.  Baseline:  Goal status: Met on 04/26/24   4.   Patient will report 3/50 on NDI to demonstrate improved functional ability.  Baseline: 13/50 Goal status: Ongoing (see above)    PLAN:  PT FREQUENCY: 1-2x/week  PT DURATION: 8 weeks  PLANNED INTERVENTIONS: 97164- PT Re-evaluation, 97750- Physical Performance Testing, 97110-Therapeutic exercises, 97530- Therapeutic activity, [id number]- Neuromuscular re-education, 97535- Self Care, 02859- Manual therapy, [id number]- Gait training, [phone]- Canalith repositioning, [id number]- Aquatic Therapy, H9716- Electrical  stimulation (unattended), [phone]- Electrical stimulation (manual), 02983- Vasopneumatic device, [id number]- Ultrasound, [id number]- Traction (mechanical), 02966- Ionotophoresis 4mg /ml Dexamethasone , [id number] (1-2 muscles), [id number] (3+ muscles)- Dry Needling, Patient/Family education, Balance training, Stair training, Taping, Joint mobilization, Spinal manipulation, Spinal mobilization, Vestibular training, Cryotherapy, and Moist heat.  PLAN FOR NEXT SESSION:  assess response to mobs, continue postural strength,  cervical ROM, postural strengthening, manual/dry needling as needed   Jarrell Laming, PT, DPT 06/01/24, 8:25 AM  [facility] [address], [address] [place], [place] [address] Phone # [phone] Fax  [phone]

## 2024-06-02 ENCOUNTER — Ambulatory Visit

## 2024-06-02 DIAGNOSIS — J309 Allergic rhinitis, unspecified: Secondary | ICD-10-CM

## 2024-06-03 ENCOUNTER — Ambulatory Visit

## 2024-06-03 DIAGNOSIS — M6281 Muscle weakness (generalized): Secondary | ICD-10-CM | POA: Diagnosis not present

## 2024-06-03 DIAGNOSIS — R293 Abnormal posture: Secondary | ICD-10-CM | POA: Diagnosis not present

## 2024-06-03 DIAGNOSIS — R252 Cramp and spasm: Secondary | ICD-10-CM | POA: Diagnosis not present

## 2024-06-03 DIAGNOSIS — M5416 Radiculopathy, lumbar region: Secondary | ICD-10-CM | POA: Diagnosis not present

## 2024-06-03 DIAGNOSIS — M542 Cervicalgia: Secondary | ICD-10-CM | POA: Diagnosis not present

## 2024-06-03 DIAGNOSIS — R262 Difficulty in walking, not elsewhere classified: Secondary | ICD-10-CM | POA: Diagnosis not present

## 2024-06-03 NOTE — Therapy (Signed)
 OUTPATIENT PHYSICAL THERAPY TREATMENT NOTE   Patient Name: Valerie [name] MRN: [id number] DOB:Jul 29, 1966, 58 y.o., female Today's Date: 06/03/2024  END OF SESSION:  PT End of Session - 06/03/24 1625     Visit Number 16    Date for Recertification  06/18/24    Authorization Type [facility] [employer]    PT Start Time 1620    PT Stop Time 1702    PT Time Calculation (min) 42 min    Activity Tolerance Patient tolerated treatment well    Behavior During Therapy [facility] for tasks assessed/performed                Past Medical History:  Diagnosis Date   Allergy    cats   Anemia    NOS   Cataract    Female fertility problem    hx of treatment   GERD (gastroesophageal reflux disease)    HSV infection    labialis   RECTAL BLEEDING 07/10/2007   Qualifier: Diagnosis of  By: Charlett MD, Apolinar POUR 2008 colonoscopy [place].    Past Surgical History:  Procedure Laterality Date   CYST REMOVAL HAND Right    DILITATION & CURRETTAGE/HYSTROSCOPY WITH VERSAPOINT RESECTION  08/27/2012   Procedure: DILATATION & CURETTAGE/HYSTEROSCOPY WITH VERSAPOINT RESECTION;  Surgeon: Marie-Lyne Lavoie, MD;  Location: [facility];  Service: Gynecology;;   EYE SURGERY  Lasik   LASIK  2009   mono vision   TRIGGER FINGER RELEASE Right 06/02/2017   Procedure: RELEASE TRIGGER FINGER/A-1 PULLEY RIGHT RING TRIGGER RELEASE;  Surgeon: Murrell Drivers, MD;  Location: [facility];  Service: Orthopedics;  Laterality: Right;   Patient Active Problem List   Diagnosis Date Noted   Genetic testing 03/01/2024   Well woman exam with routine gynecological exam 10/29/2023   Bicipital tendinitis of right shoulder 02/16/2020   Myofascial muscle pain 11/08/2019   Chronic bilateral thoracic back pain 11/08/2019   Pain of both shoulder joints 09/21/2015   Impingement syndrome of right shoulder 03/08/2015   Trigger point of right shoulder region 03/08/2015   Low back pain 03/16/2014   Wrist pain, right 02/09/2014    Nonallopathic lesion of cervical region 11/03/2013   Nonallopathic lesion of thoracic region 11/03/2013   Nonallopathic lesion-rib cage 11/03/2013   Nonallopathic lesion of lumbosacral region 11/03/2013   Nonallopathic lesion of sacral region 11/03/2013   Imbalance 10/27/2012   Neck pain 10/27/2012   Headache 07/15/2012   Abdominal cramps 02/25/2012   Abnormal stools 02/25/2012   Delayed menses 02/25/2012   Visit for preventive health examination 12/03/2011   Abdominal bloating 12/03/2011   DYSESTHESIA 09/20/2009   ARREST OF BONE DEVELOPMENT OR GROWTH 07/24/2007   DERMATOPHYTOSIS OF NAIL 07/10/2007   ALLERGIC RHINITIS 07/10/2007   GERD 07/10/2007   BLOOD IN STOOL 07/10/2007   HERPES LABIALIS, HX OF 07/10/2007    PCP: Charlett Apolinar POUR, MD   REFERRING PROVIDER: [place], Betty G, MD    REFERRING DIAG:  M54.9,G89.29 (ICD-10-CM) - Upper back pain, chronic  M54.50,G89.29 (ICD-10-CM) - Chronic right-sided low back pain, unspecified whether sciatica present    Rationale for Evaluation and Treatment: Rehabilitation  THERAPY DIAG:  Abnormal posture  Cramp and spasm  Cervicalgia  Muscle weakness (generalized)  Radiculopathy, lumbar region  ONSET DATE: 3 weeks ago  SUBJECTIVE:  SUBJECTIVE STATEMENT: Pt reports feeling good but still tight in bilateral upper traps and in between shoulder blades.      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 Yes: NPRS scale: 1-2/10 Pain location: thoracic/shoulder Pain description: ache tight twisting Aggravating factors: stress, gardening, cleaning, carrying  Relieving factors: massage, heat, tylenol , ice   PRECAUTIONS: None  RED FLAGS: None   WEIGHT BEARING  RESTRICTIONS: No  FALLS:  Has patient fallen in last 6 months? Yes. 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  DIAGNOSTIC FINDINGS:  2019 MRI IMPRESSION: 1. At L4-5 there is a mild broad-based disc bulge. Mild bilateral facet arthropathy and bilateral lateral recess narrowing. 2. At L5-S1 there is a broad-based disc bulge with a small central disc protrusion.  PATIENT SURVEYS:  Eval: NDI  13 / 50 = 26.0 % Quick dash 20.5 / 100 = 20.5 %  04/20/2024: Neck Disability Index:  6/50 = 12 % Quick DASH:  11.4/100 = 11.4 %  04/26/2024: Quick DASH:  9.09%   COGNITION: Overall cognitive status: Within functional limits for tasks assessed    MUSCLE LENGTH: Tight UT, pectoralis, cervical paraspinals  POSTURE: rounded shoulders and forward head  PALPATION: TTP in B UT, SO, paraspinals, pecs Spinal mobiliy decreased in mid thoracic   CERVICAL ROM:  rotation 40 R  30 L pain turning L into R upper arm, flexion full pain down into thoracic spin Lateral flex R 23   L 30 pain into L shoulder  03/18/24:  Cervical sidebending: Lt 35 Rt 48. Rotation: Rt: 65   Lt 65  UE ROM: Full B except R ABD 160 (full PROM)  04/26/2024: Cervical flexion: 70 deg Cervical extension:  40 deg Cervical right sidebending: 40 deg Cervical left sidebending: 45 deg Cervical right rotation:  60 deg Cervical left rotation:  60 deg  LUMBAR ROM: Limited with R rotation 25% feels in R hip also with R SB, else ROM WNL  Negative cervical special tests    TREATMENT DATE:                                                                                                                                06/03/2024: UBE x 6 min level 1 (3/3) (PT present to discuss status and progress toward goals) 3 way scapular stabilization with yellow loop x 5 each UE 4D ball rolls x 20 each  Prone with 2#: shoulder flexion, horizontal abduction, extension, and row.  2x10 each bilat Side lying shoulder ER with 2 lbs 2 x 10  both Supine serratus punch with 2 lbs 2 x 10 both Trigger Point Dry Needling Subsequent Treatment: Instructions provided previously at initial dry needling treatment.  Patient Verbal Consent Given: Yes Education Handout Provided: Previously Provided Muscles Treated: B UT, LS, cervical multifidi, B rhomboids, low traps and thoracic paraspinals, sub occipitals Electrical Stimulation Performed: No Treatment Response/Outcome: Skilled palpation used to identify taut bands and trigger points.  Once identified, dry needling techniques used to treat these areas.  Twitch response ellicited along with palpable elongation of muscle.  Following treatment,     06/01/2024: Nustep x 6 min level 5 (PT present to discuss status and progress toward goals) Prone with 2#: shoulder flexion, horizontal abduction, extension, and row.  2x10 each bilat Side lying shoulder ER with 2 lbs 2 x 10 both Supine serratus punch with 2 lbs 2 x 10 both Seated upper trap stretch x20 sec bilat Seated levator stretch x20 sec bilat Seated ulnar nerve stretch (hands in prayer stretch moving side to side) x10 Standing row with twist with 5# cable pulley 2x10 bilat Attempted chop with 5# pulley, but patient could not perform without increased pain Modified plank position on barre performing alt shoulder taps x10   05/27/2024: Nustep x 5 min level 5 (PT present to discuss status and progress toward goals) Prone reach and pull x 10 Side lying open book x 10 each side Prone shoulder ext, row and horizontal abduction with 2 lbs 2 x 10 each both Side lying shoulder ER with 2 lbs 2 x 10 both Supine  serratus punch with 2 lbs 2 x 10 both Manual: PA mobs T2-8 grade 2-3 and rotational mobs T4-6   PATIENT EDUCATION:  Education details: PT eval findings, anticipated POC, initial HEP, postural awareness, and role of TPDN Educated on how weak rotator cuff musculature would result in upper trap and levator compensation   Person educated: Patient Education method: Explanation, Demonstration, Tactile cues, Verbal cues, and Handouts Education comprehension: verbalized understanding and returned demonstration  HOME EXERCISE PROGRAM: Access Code: [email or web address] URL: [email or web address] Date: 05/27/2024 Prepared by: Burnard  Exercises - Seated Cervical Retraction  - 2 x daily - 7 x weekly - 1 sets - 10 reps - 5 hold - Seated Cervical Sidebending AROM  - 1 x daily - 7 x weekly - 1 sets - 10 reps - 5 hold - Seated Assisted Cervical Rotation with Towel  - 1 x daily - 7 x weekly - 1 sets - 10 reps - Cervical Extension AROM with Strap  - 1 x daily - 7 x weekly - 1 sets - 10 reps - Sidelying Thoracic Rotation with Open Book  - 1-2 x daily - 7 x weekly - 1 sets - 10 reps - Doorway Pec Stretch at 90 Degrees Abduction  - 2 x daily - 7 x weekly - 1 sets - 3 reps - 30 seconds hold - Seated Hamstring Stretch  - 1 x daily - 7 x weekly - 2 reps - 20 sec hold - Seated Piriformis Stretch with Trunk Bend  - 1 x daily - 7 x weekly - 2 reps - 20 sec hold - Seated Thoracic Flexion and Rotation with Swiss Ball  - 1 x daily - 7 x weekly - 10 reps - Cat Cow  - 1 x daily - 7 x weekly - 2 sets - 10 reps - Quadruped Rocking Backward  - 1 x daily - 7 x weekly - 2 sets - 10 reps - Quadruped Transversus Abdominis  Bracing  - 1 x daily - 7 x weekly - 2 sets - 10 reps - Bicep Stretch at Table  - 1 x daily - 7 x weekly - 1 sets - 3 reps - 30 sec hold - Cervical Diagonals Prone on Elbows  - 1 x daily - 7 x weekly - 2 sets - 10 reps - Prone Shoulder Extension - Single Arm  - 2 x daily - 7 x weekly - 2 sets - 10 reps -  Prone Shoulder Row  - 2 x daily - 7 x weekly - 2 sets - 10 reps - Prone Single Arm Shoulder Horizontal Abduction with Scapular Retraction and Palm Down  - 2 x daily - 7 x weekly - 2 sets - 10 reps - Sidelying Shoulder External Rotation  - 2 x daily - 7 x weekly - 2 sets - 10 reps - Single Arm Serratus Punches in Supine with Dumbbell  - 2 x daily - 7 x weekly - 2 sets - 10 reps  Patient Education - TENS UNIT - AUVON Dual Channel TENS Unit - TENS Unit  ASSESSMENT:  CLINICAL IMPRESSION:  Ms Vesey is progressing appropriately.  She is able to complete all shoulder exercises and scapular stabilization with less fatigue.  We added 3 way scap stabilization and 4D ball rolls.  She responded well to DN again today and had several twitch responses.  Patient continues to require skilled PT to progress towards goal related activities.  OBJECTIVE IMPAIRMENTS: decreased activity tolerance, decreased ROM, decreased strength, increased muscle spasms, impaired flexibility, postural dysfunction, and pain.   ACTIVITY LIMITATIONS: carrying, lifting, sleeping, reach over head, and hygiene/grooming  PARTICIPATION LIMITATIONS: meal prep, cleaning, laundry, driving, occupation, and yard work  PERSONAL FACTORS: Profession and 1 comorbidity: chronic shoulder and LBP are also affecting patient's functional outcome.   REHAB POTENTIAL: Excellent  CLINICAL DECISION MAKING: Evolving/moderate complexity  EVALUATION COMPLEXITY: Moderate   GOALS: Goals reviewed with patient? Yes  SHORT TERM GOALS: Target date: 03/31/2024   Patient will be independent with initial HEP.  Baseline:  Goal status: Met on 03/09/24  2.  Decreased pain in the neck and shoulders by 50% with ADLs  Baseline: 25% (03/18/24) Goal status: Met on 04/15/24 (reports 75% better)    LONG TERM GOALS: Target date: 06/18/2024   Patient will be independent with advanced/ongoing HEP to improve outcomes and carryover.  Baseline:  Goal status:  Ongoing  2.  Patient will report 85% improvement in neck pain to improve QOL.  Baseline:  Goal status: Ongoing (reports 75% improvement on 04/15/24)  3.  Patient will demonstrate full pain free cervical ROM for safety with driving.  Baseline:  Goal status: Met on 04/26/24   4.   Patient will report 3/50 on NDI to demonstrate improved functional ability.  Baseline: 13/50 Goal status: Ongoing (see above)    PLAN:  PT FREQUENCY: 1-2x/week  PT DURATION: 8 weeks  PLANNED INTERVENTIONS: 97164- PT Re-evaluation, 97750- Physical Performance Testing, 97110-Therapeutic exercises, 97530- Therapeutic activity, [id number]- Neuromuscular re-education, 97535- Self Care, [id number]- Manual therapy, [id number]- Gait training, [phone]- Canalith repositioning, [id number]- Aquatic Therapy, [id number]- Electrical stimulation (unattended), [phone]- Electrical stimulation (manual), [id number]- Vasopneumatic device, [id number]- Ultrasound, [id number]- Traction (mechanical), [id number]- Ionotophoresis 4mg /ml Dexamethasone , [id number] (1-2 muscles), [id number] (3+ muscles)- Dry Needling, Patient/Family education, Balance training, Stair training, Taping, Joint mobilization, Spinal manipulation, Spinal mobilization, Vestibular training, Cryotherapy, and Moist heat.  PLAN FOR NEXT SESSION:  assess response to DN, continue postural strength,  cervical ROM, postural strengthening, manual/dry  needling as needed   Delon B. Poppi Scantling, PT 06/03/24 5:06 PM [facility] [address], [address] [place], [place] [address] Phone # [phone] Fax [phone]

## 2024-06-07 ENCOUNTER — Encounter: Admitting: Physical Therapy

## 2024-06-08 ENCOUNTER — Encounter: Payer: Self-pay | Admitting: Rehabilitative and Restorative Service Providers"

## 2024-06-08 ENCOUNTER — Ambulatory Visit: Admitting: Rehabilitative and Restorative Service Providers"

## 2024-06-08 DIAGNOSIS — R252 Cramp and spasm: Secondary | ICD-10-CM

## 2024-06-08 DIAGNOSIS — M5416 Radiculopathy, lumbar region: Secondary | ICD-10-CM

## 2024-06-08 DIAGNOSIS — R262 Difficulty in walking, not elsewhere classified: Secondary | ICD-10-CM | POA: Diagnosis not present

## 2024-06-08 DIAGNOSIS — M6281 Muscle weakness (generalized): Secondary | ICD-10-CM | POA: Diagnosis not present

## 2024-06-08 DIAGNOSIS — M542 Cervicalgia: Secondary | ICD-10-CM | POA: Diagnosis not present

## 2024-06-08 DIAGNOSIS — R293 Abnormal posture: Secondary | ICD-10-CM | POA: Diagnosis not present

## 2024-06-08 NOTE — Therapy (Signed)
 OUTPATIENT PHYSICAL THERAPY TREATMENT NOTE   Patient Name: Valerie [name] MRN: [id number] DOB:[birth year], 58 y.o., female Today's Date: 06/08/2024  END OF SESSION:  PT End of Session - 06/08/24 0739     Visit Number 17    Date for Recertification  06/18/24    Authorization Type [facility] [employer]    PT Start Time [phone]    PT Stop Time 0800    PT Time Calculation (min) 26 min    Activity Tolerance Patient tolerated treatment well    Behavior During Therapy [facility] for tasks assessed/performed                Past Medical History:  Diagnosis Date   Allergy    cats   Anemia    NOS   Cataract    Female fertility problem    hx of treatment   GERD (gastroesophageal reflux disease)    HSV infection    labialis   RECTAL BLEEDING 07/10/2007   Qualifier: Diagnosis of  By: Charlett MD, Apolinar POUR 2008 colonoscopy [place].    Past Surgical History:  Procedure Laterality Date   CYST REMOVAL HAND Right    DILITATION & CURRETTAGE/HYSTROSCOPY WITH VERSAPOINT RESECTION  08/27/2012   Procedure: DILATATION & CURETTAGE/HYSTEROSCOPY WITH VERSAPOINT RESECTION;  Surgeon: Marie-Lyne Lavoie, MD;  Location: [facility];  Service: Gynecology;;   EYE SURGERY  Lasik   LASIK  2009   mono vision   TRIGGER FINGER RELEASE Right 06/02/2017   Procedure: RELEASE TRIGGER FINGER/A-1 PULLEY RIGHT RING TRIGGER RELEASE;  Surgeon: Murrell Drivers, MD;  Location: [facility];  Service: Orthopedics;  Laterality: Right;   Patient Active Problem List   Diagnosis Date Noted   Genetic testing 03/01/2024   Well woman exam with routine gynecological exam 10/29/2023   Bicipital tendinitis of right shoulder 02/16/2020   Myofascial muscle pain 11/08/2019   Chronic bilateral thoracic back pain 11/08/2019   Pain of both shoulder joints 09/21/2015   Impingement syndrome of right shoulder 03/08/2015   Trigger point of right shoulder region 03/08/2015   Low back pain 03/16/2014   Wrist pain, right 02/09/2014    Nonallopathic lesion of cervical region 11/03/2013   Nonallopathic lesion of thoracic region 11/03/2013   Nonallopathic lesion-rib cage 11/03/2013   Nonallopathic lesion of lumbosacral region 11/03/2013   Nonallopathic lesion of sacral region 11/03/2013   Imbalance 10/27/2012   Neck pain 10/27/2012   Headache 07/15/2012   Abdominal cramps 02/25/2012   Abnormal stools 02/25/2012   Delayed menses 02/25/2012   Visit for preventive health examination 12/03/2011   Abdominal bloating 12/03/2011   DYSESTHESIA 09/20/2009   ARREST OF BONE DEVELOPMENT OR GROWTH 07/24/2007   DERMATOPHYTOSIS OF NAIL 07/10/2007   ALLERGIC RHINITIS 07/10/2007   GERD 07/10/2007   BLOOD IN STOOL 07/10/2007   HERPES LABIALIS, HX OF 07/10/2007    PCP: Charlett Apolinar POUR, MD   REFERRING PROVIDER: [place], Betty G, MD    REFERRING DIAG:  M54.9,G89.29 (ICD-10-CM) - Upper back pain, chronic  M54.50,G89.29 (ICD-10-CM) - Chronic right-sided low back pain, unspecified whether sciatica present    Rationale for Evaluation and Treatment: Rehabilitation  THERAPY DIAG:  Abnormal posture  Cramp and spasm  Cervicalgia  Muscle weakness (generalized)  Radiculopathy, lumbar region  Difficulty in walking, not elsewhere classified  ONSET DATE: 3 weeks ago  SUBJECTIVE:  SUBJECTIVE STATEMENT: Pt reports that the dry needling helps for a couple of days, but then the pain returns.  Pt states that she has used the TENS unit, but has had mixed results.     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 Yes: NPRS scale: 3/10 Pain location: thoracic/shoulder Pain description: ache tight twisting Aggravating factors: stress, gardening, cleaning, carrying   Relieving factors: massage, heat, tylenol , ice   PRECAUTIONS: None  RED FLAGS: None   WEIGHT BEARING RESTRICTIONS: No  FALLS:  Has patient fallen in last 6 months? Yes. 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  DIAGNOSTIC FINDINGS:  2019 MRI IMPRESSION: 1. At L4-5 there is a mild broad-based disc bulge. Mild bilateral facet arthropathy and bilateral lateral recess narrowing. 2. At L5-S1 there is a broad-based disc bulge with a small central disc protrusion.  PATIENT SURVEYS:  Eval: NDI  13 / 50 = 26.0 % Quick dash 20.5 / 100 = 20.5 %  04/20/2024: Neck Disability Index:  6/50 = 12 % Quick DASH:  11.4/100 = 11.4 %  04/26/2024: Quick DASH:  9.09%   COGNITION: Overall cognitive status: Within functional limits for tasks assessed    MUSCLE LENGTH: Tight UT, pectoralis, cervical paraspinals  POSTURE: rounded shoulders and forward head  PALPATION: TTP in B UT, SO, paraspinals, pecs Spinal mobiliy decreased in mid thoracic   CERVICAL ROM:  rotation 40 R  30 L pain turning L into R upper arm, flexion full pain down into thoracic spin Lateral flex R 23   L 30 pain into L shoulder  03/18/24:  Cervical sidebending: Lt 35 Rt 48. Rotation: Rt: 65   Lt 65  UE ROM: Full B except R ABD 160 (full PROM)  04/26/2024: Cervical flexion: 70 deg Cervical extension:  40 deg Cervical right sidebending: 40 deg Cervical left sidebending: 45 deg Cervical right rotation:  60 deg Cervical left rotation:  60 deg  LUMBAR ROM: Limited with R rotation 25% feels in R hip also with R SB, else ROM WNL  Negative cervical special  tests    TREATMENT DATE:                                                                                                                                06/08/2024: Nustep level 5 x5 min with PT present to discuss status 3 way scapular stabilization with yellow loop 2x5 bilat Standing lat pulldown 25# 2x10 Standing row with twist with 5# cable pulley 2x10 bilat Seated ulnar nerve stretch (hands in prayer  stretch moving side to side) x10 Standing shoulder ER with red tband 2x10 Standing shoulder horizontal abduction with red tband 2x10 Standing shoulder D2 with red tband x10 bilat   06/03/2024: UBE x 6 min level 1 (3/3) (PT present to discuss status and progress toward goals) 3 way scapular stabilization with yellow loop x 5 each UE 4D ball rolls x 20 each  Prone with 2#: shoulder flexion, horizontal abduction, extension, and row.  2x10 each bilat Side lying shoulder ER with 2 lbs 2 x 10 both Supine serratus punch with 2 lbs 2 x 10 both Trigger Point Dry Needling Subsequent Treatment: Instructions provided previously at initial dry needling treatment.  Patient Verbal Consent Given: Yes Education Handout Provided: Previously Provided Muscles Treated: B UT, LS, cervical multifidi, B rhomboids, low traps and thoracic paraspinals, sub occipitals Electrical Stimulation Performed: No Treatment Response/Outcome: Skilled palpation used to identify taut bands and trigger points.  Once identified, dry needling techniques used to treat these areas.  Twitch response ellicited along with palpable elongation of muscle.  Following treatment,     06/01/2024: Nustep x 6 min level 5 (PT present to discuss status and progress toward goals) Prone with 2#: shoulder flexion, horizontal abduction, extension, and row.  2x10 each bilat Side lying shoulder ER with 2 lbs 2 x 10 both Supine serratus punch with 2 lbs 2 x 10 both Seated upper trap stretch x20 sec bilat Seated levator stretch x20 sec  bilat Seated ulnar nerve stretch (hands in prayer stretch moving side to side) x10 Standing row with twist with 5# cable pulley 2x10 bilat Attempted chop with 5# pulley, but patient could not perform without increased pain Modified plank position on barre performing alt shoulder taps x10    PATIENT EDUCATION:  Education details: PT eval findings, anticipated POC, initial HEP, postural awareness, and role of TPDN Educated on how weak rotator cuff musculature would result in upper trap and levator compensation   Person educated: Patient Education method: Explanation, Demonstration, Tactile cues, Verbal cues, and Handouts Education comprehension: verbalized understanding and returned demonstration  HOME EXERCISE PROGRAM: Access Code: [email or web address] URL: [email or web address] Date: 05/27/2024 Prepared by: Burnard  Exercises - Seated Cervical Retraction  - 2 x daily - 7 x weekly - 1 sets - 10 reps - 5 hold - Seated Cervical Sidebending AROM  - 1 x daily - 7 x weekly - 1 sets - 10 reps - 5 hold - Seated Assisted Cervical Rotation with Towel  - 1 x daily - 7 x weekly - 1 sets - 10 reps - Cervical Extension AROM with Strap  - 1 x daily - 7 x weekly - 1 sets - 10 reps - Sidelying Thoracic Rotation with Open Book  - 1-2 x daily - 7 x weekly - 1 sets - 10 reps - Doorway Pec Stretch at 90 Degrees Abduction  - 2 x daily - 7 x weekly - 1 sets - 3 reps - 30 seconds hold - Seated Hamstring Stretch  - 1 x daily - 7 x weekly - 2 reps - 20 sec hold - Seated Piriformis Stretch with Trunk Bend  - 1 x daily - 7 x weekly - 2 reps - 20 sec hold - Seated Thoracic Flexion and Rotation with Swiss Ball  - 1 x daily - 7 x weekly - 10 reps - Cat Cow  - 1 x daily - 7 x weekly - 2 sets - 10 reps - Quadruped Rocking Backward  - 1 x daily -  7 x weekly - 2 sets - 10 reps - Quadruped Transversus Abdominis Bracing  - 1 x daily - 7 x weekly - 2 sets - 10 reps - Bicep Stretch at Table  - 1 x daily - 7 x weekly - 1  sets - 3 reps - 30 sec hold - Cervical Diagonals Prone on Elbows  - 1 x daily - 7 x weekly - 2 sets - 10 reps - Prone Shoulder Extension - Single Arm  - 2 x daily - 7 x weekly - 2 sets - 10 reps - Prone Shoulder Row  - 2 x daily - 7 x weekly - 2 sets - 10 reps - Prone Single Arm Shoulder Horizontal Abduction with Scapular Retraction and Palm Down  - 2 x daily - 7 x weekly - 2 sets - 10 reps - Sidelying Shoulder External Rotation  - 2 x daily - 7 x weekly - 2 sets - 10 reps - Single Arm Serratus Punches in Supine with Dumbbell  - 2 x daily - 7 x weekly - 2 sets - 10 reps  Patient Education - TENS UNIT - AUVON Dual Channel TENS Unit - TENS Unit  ASSESSMENT:  CLINICAL IMPRESSION:  Ms Berent presents to skilled PT reporting similar short-lived results with dry needling.  PT spoke to patient about TENS unit and advised her that the setting might be too intense and that the TENS unit should not be to tolerance, but more of a comfortable setting.  Patient verbalized understanding.  Patient able to progress with theraband strengthening during session.  Patient continues to require skilled PT to progress towards goal related activities.  OBJECTIVE IMPAIRMENTS: decreased activity tolerance, decreased ROM, decreased strength, increased muscle spasms, impaired flexibility, postural dysfunction, and pain.   ACTIVITY LIMITATIONS: carrying, lifting, sleeping, reach over head, and hygiene/grooming  PARTICIPATION LIMITATIONS: meal prep, cleaning, laundry, driving, occupation, and yard work  PERSONAL FACTORS: Profession and 1 comorbidity: chronic shoulder and LBP are also affecting patient's functional outcome.   REHAB POTENTIAL: Excellent  CLINICAL DECISION MAKING: Evolving/moderate complexity  EVALUATION COMPLEXITY: Moderate   GOALS: Goals reviewed with patient? Yes  SHORT TERM GOALS: Target date: 03/31/2024   Patient will be independent with initial HEP.  Baseline:  Goal status: Met on  03/09/24  2.  Decreased pain in the neck and shoulders by 50% with ADLs  Baseline: 25% (03/18/24) Goal status: Met on 04/15/24 (reports 75% better)    LONG TERM GOALS: Target date: 06/18/2024   Patient will be independent with advanced/ongoing HEP to improve outcomes and carryover.  Baseline:  Goal status: Ongoing  2.  Patient will report 85% improvement in neck pain to improve QOL.  Baseline:  Goal status: Ongoing (reports 75% improvement on 04/15/24)  3.  Patient will demonstrate full pain free cervical ROM for safety with driving.  Baseline:  Goal status: Met on 04/26/24   4.   Patient will report 3/50 on NDI to demonstrate improved functional ability.  Baseline: 13/50 Goal status: Ongoing (see above)    PLAN:  PT FREQUENCY: 1-2x/week  PT DURATION: 8 weeks  PLANNED INTERVENTIONS: 97164- PT Re-evaluation, 97750- Physical Performance Testing, 97110-Therapeutic exercises, 97530- Therapeutic activity, [id number]- Neuromuscular re-education, 97535- Self Care, [id number]- Manual therapy, [id number]- Gait training, [id number]- Canalith repositioning, [id number]- Aquatic Therapy, [id number]- Electrical stimulation (unattended), [id number]- Electrical stimulation (manual), [id number]- Vasopneumatic device, [id number]- Ultrasound, [id number]- Traction (mechanical), [id number]- Ionotophoresis 4mg /ml Dexamethasone , 79439 (1-2 muscles), [id number] (3+ muscles)- Dry Needling, Patient/Family education, Balance training, Stair training, Taping, Joint  mobilization, Spinal manipulation, Spinal mobilization, Vestibular training, Cryotherapy, and Moist heat.  PLAN FOR NEXT SESSION:  assess response to DN, continue postural strength,  cervical ROM, postural strengthening, manual/dry needling as needed   Jarrell Laming, PT, DPT 06/08/24, 8:03 AM  [facility] [address], [address] [place], [place] [address] Phone # [phone] Fax [phone]

## 2024-06-09 ENCOUNTER — Ambulatory Visit: Admitting: *Deleted

## 2024-06-09 ENCOUNTER — Encounter: Admitting: Rehabilitative and Restorative Service Providers"

## 2024-06-09 DIAGNOSIS — H5201 Hypermetropia, right eye: Secondary | ICD-10-CM | POA: Diagnosis not present

## 2024-06-09 DIAGNOSIS — J309 Allergic rhinitis, unspecified: Secondary | ICD-10-CM

## 2024-06-09 DIAGNOSIS — H5212 Myopia, left eye: Secondary | ICD-10-CM | POA: Diagnosis not present

## 2024-06-10 ENCOUNTER — Ambulatory Visit: Admitting: Rehabilitative and Restorative Service Providers"

## 2024-06-10 ENCOUNTER — Encounter: Payer: Self-pay | Admitting: Rehabilitative and Restorative Service Providers"

## 2024-06-10 DIAGNOSIS — R262 Difficulty in walking, not elsewhere classified: Secondary | ICD-10-CM | POA: Diagnosis not present

## 2024-06-10 DIAGNOSIS — M6281 Muscle weakness (generalized): Secondary | ICD-10-CM | POA: Diagnosis not present

## 2024-06-10 DIAGNOSIS — R293 Abnormal posture: Secondary | ICD-10-CM | POA: Diagnosis not present

## 2024-06-10 DIAGNOSIS — M5416 Radiculopathy, lumbar region: Secondary | ICD-10-CM | POA: Diagnosis not present

## 2024-06-10 DIAGNOSIS — R252 Cramp and spasm: Secondary | ICD-10-CM

## 2024-06-10 DIAGNOSIS — M542 Cervicalgia: Secondary | ICD-10-CM | POA: Diagnosis not present

## 2024-06-10 NOTE — Therapy (Signed)
 OUTPATIENT PHYSICAL THERAPY TREATMENT NOTE   Patient Name: Valerie [name] MRN: [id number] DOB:Aug 12, 1966, 58 y.o., female Today's Date: 06/10/2024  END OF SESSION:  PT End of Session - 06/10/24 0748     Visit Number 18    Date for Recertification  06/18/24    Authorization Type [facility] [employer]    PT Start Time [phone]    PT Stop Time 0800    PT Time Calculation (min) 25 min    Activity Tolerance Patient tolerated treatment well    Behavior During Therapy [facility] for tasks assessed/performed                Past Medical History:  Diagnosis Date   Allergy    cats   Anemia    NOS   Cataract    Female fertility problem    hx of treatment   GERD (gastroesophageal reflux disease)    HSV infection    labialis   RECTAL BLEEDING 07/10/2007   Qualifier: Diagnosis of  By: Charlett MD, Apolinar POUR 2008 colonoscopy [place].    Past Surgical History:  Procedure Laterality Date   CYST REMOVAL HAND Right    DILITATION & CURRETTAGE/HYSTROSCOPY WITH VERSAPOINT RESECTION  08/27/2012   Procedure: DILATATION & CURETTAGE/HYSTEROSCOPY WITH VERSAPOINT RESECTION;  Surgeon: Marie-Lyne Lavoie, MD;  Location: [facility];  Service: Gynecology;;   EYE SURGERY  Lasik   LASIK  2009   mono vision   TRIGGER FINGER RELEASE Right 06/02/2017   Procedure: RELEASE TRIGGER FINGER/A-1 PULLEY RIGHT RING TRIGGER RELEASE;  Surgeon: Murrell Drivers, MD;  Location:  SURGERY CENTER;  Service: Orthopedics;  Laterality: Right;   Patient Active Problem List   Diagnosis Date Noted   Genetic testing 03/01/2024   Well woman exam with routine gynecological exam 10/29/2023   Bicipital tendinitis of right shoulder 02/16/2020   Myofascial muscle pain 11/08/2019   Chronic bilateral thoracic back pain 11/08/2019   Pain of both shoulder joints 09/21/2015   Impingement syndrome of right shoulder 03/08/2015   Trigger point of right shoulder region 03/08/2015   Low back pain 03/16/2014   Wrist pain, right 02/09/2014    Nonallopathic lesion of cervical region 11/03/2013   Nonallopathic lesion of thoracic region 11/03/2013   Nonallopathic lesion-rib cage 11/03/2013   Nonallopathic lesion of lumbosacral region 11/03/2013   Nonallopathic lesion of sacral region 11/03/2013   Imbalance 10/27/2012   Neck pain 10/27/2012   Headache 07/15/2012   Abdominal cramps 02/25/2012   Abnormal stools 02/25/2012   Delayed menses 02/25/2012   Visit for preventive health examination 12/03/2011   Abdominal bloating 12/03/2011   DYSESTHESIA 09/20/2009   ARREST OF BONE DEVELOPMENT OR GROWTH 07/24/2007   DERMATOPHYTOSIS OF NAIL 07/10/2007   ALLERGIC RHINITIS 07/10/2007   GERD 07/10/2007   BLOOD IN STOOL 07/10/2007   HERPES LABIALIS, HX OF 07/10/2007    PCP: Charlett Apolinar POUR, MD   REFERRING PROVIDER: [place], Betty G, MD    REFERRING DIAG:  M54.9,G89.29 (ICD-10-CM) - Upper back pain, chronic  M54.50,G89.29 (ICD-10-CM) - Chronic right-sided low back pain, unspecified whether sciatica present    Rationale for Evaluation and Treatment: Rehabilitation  THERAPY DIAG:  Abnormal posture  Cramp and spasm  Cervicalgia  Muscle weakness (generalized)  Radiculopathy, lumbar region  Difficulty in walking, not elsewhere classified  ONSET DATE: 3 weeks ago  SUBJECTIVE:  SUBJECTIVE STATEMENT: Pt reports that the dry needling helps for a couple of days, but then the pain returns.  Pt states that she has used the TENS unit, but has had mixed results.     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 Yes: NPRS scale: 4/10 Pain location: thoracic/shoulder Pain description: ache tight twisting Aggravating factors: stress, gardening, cleaning, carrying   Relieving factors: massage, heat, tylenol , ice   PRECAUTIONS: None  RED FLAGS: None   WEIGHT BEARING RESTRICTIONS: No  FALLS:  Has patient fallen in last 6 months? Yes. 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  DIAGNOSTIC FINDINGS:  2019 MRI IMPRESSION: 1. At L4-5 there is a mild broad-based disc bulge. Mild bilateral facet arthropathy and bilateral lateral recess narrowing. 2. At L5-S1 there is a broad-based disc bulge with a small central disc protrusion.  PATIENT SURVEYS:  Eval: NDI  13 / 50 = 26.0 % Quick dash 20.5 / 100 = 20.5 %  04/20/2024: Neck Disability Index:  6/50 = 12 % Quick DASH:  11.4/100 = 11.4 %  04/26/2024: Quick DASH:  9.09%   COGNITION: Overall cognitive status: Within functional limits for tasks assessed    MUSCLE LENGTH: Tight UT, pectoralis, cervical paraspinals  POSTURE: rounded shoulders and forward head  PALPATION: TTP in B UT, SO, paraspinals, pecs Spinal mobiliy decreased in mid thoracic   CERVICAL ROM:  rotation 40 R  30 L pain turning L into R upper arm, flexion full pain down into thoracic spin Lateral flex R 23   L 30 pain into L shoulder  03/18/24:  Cervical sidebending: Lt 35 Rt 48. Rotation: Rt: 65   Lt 65  UE ROM: Full B except R ABD 160 (full PROM)  04/26/2024: Cervical flexion: 70 deg Cervical extension:  40 deg Cervical right sidebending: 40 deg Cervical left sidebending: 45 deg Cervical right rotation:  60 deg Cervical left rotation:  60 deg  LUMBAR ROM: Limited with R rotation 25% feels in R hip also with R SB, else ROM WNL  Negative cervical special  tests    TREATMENT DATE:                                                                                                                                06/10/2024: Nustep level 5 x5 min with PT present to discuss status Supine with soft foam roll along spine:  snow angels 2x10 Supine with soft foam roll along spine: shoulder horizontal abduction with red tband 2x10 Supine with soft foam roll along  spine: shoulder ER with red tband 2x10 Supine with soft foam roll along spine: chest stretch with hands behind head 2x20 sec Seated 3 way green pball rollout x10 each Seated posterior delt across body arm stretch x20 sec bilat Seated extension stretch x10   06/08/2024: Nustep level 5 x5 min with PT present to discuss status 3 way scapular stabilization with yellow loop 2x5 bilat Standing lat pulldown 25# 2x10 Standing row with twist with 5# cable pulley 2x10 bilat Seated ulnar nerve stretch (hands in prayer stretch moving side to side) x10 Standing shoulder ER with red tband 2x10 Standing shoulder horizontal abduction with red tband 2x10 Standing shoulder D2 with red tband x10 bilat   06/03/2024: UBE x 6 min level 1 (3/3) (PT present to discuss status and progress toward goals) 3 way scapular stabilization with yellow loop x 5 each UE 4D ball rolls x 20 each  Prone with 2#: shoulder flexion, horizontal abduction, extension, and row.  2x10 each bilat Side lying shoulder ER with 2 lbs 2 x 10 both Supine serratus punch with 2 lbs 2 x 10 both Trigger Point Dry Needling Subsequent Treatment: Instructions provided previously at initial dry needling treatment.  Patient Verbal Consent Given: Yes Education Handout Provided: Previously Provided Muscles Treated: B UT, LS, cervical multifidi, B rhomboids, low traps and thoracic paraspinals, sub occipitals Electrical Stimulation Performed: No Treatment Response/Outcome: Skilled palpation used to identify taut bands and trigger points.  Once  identified, dry needling techniques used to treat these areas.  Twitch response ellicited along with palpable elongation of muscle.  Following treatment,       PATIENT EDUCATION:  Education details: PT eval findings, anticipated POC, initial HEP, postural awareness, and role of TPDN Educated on how weak rotator cuff musculature would result in upper trap and levator compensation   Person educated: Patient Education method: Explanation, Demonstration, Tactile cues, Verbal cues, and Handouts Education comprehension: verbalized understanding and returned demonstration  HOME EXERCISE PROGRAM: Access Code: [email or web address] URL: [email or web address] Date: 05/27/2024 Prepared by: Burnard  Exercises - Seated Cervical Retraction  - 2 x daily - 7 x weekly - 1 sets - 10 reps - 5 hold - Seated Cervical Sidebending AROM  - 1 x daily - 7 x weekly - 1 sets - 10 reps - 5 hold - Seated Assisted Cervical Rotation with Towel  - 1 x daily - 7 x weekly - 1 sets - 10 reps - Cervical Extension AROM with Strap  - 1 x daily - 7 x weekly - 1 sets - 10 reps - Sidelying Thoracic Rotation with Open Book  - 1-2 x daily - 7 x weekly - 1 sets - 10 reps - Doorway Pec Stretch at 90 Degrees Abduction  - 2 x daily - 7 x weekly - 1 sets - 3 reps - 30 seconds hold - Seated Hamstring Stretch  - 1 x daily - 7 x weekly - 2 reps - 20 sec hold - Seated Piriformis Stretch with Trunk Bend  - 1 x daily - 7 x weekly - 2 reps - 20 sec hold - Seated Thoracic Flexion and Rotation with Swiss Ball  - 1 x daily - 7 x weekly - 10 reps - Cat Cow  - 1 x daily - 7 x weekly - 2 sets - 10 reps - Quadruped Rocking Backward  - 1 x daily - 7 x weekly - 2 sets - 10 reps - Quadruped Transversus Abdominis Bracing  - 1 x daily - 7 x  weekly - 2 sets - 10 reps - Bicep Stretch at Table  - 1 x daily - 7 x weekly - 1 sets - 3 reps - 30 sec hold - Cervical Diagonals Prone on Elbows  - 1 x daily - 7 x weekly - 2 sets - 10 reps - Prone Shoulder  Extension - Single Arm  - 2 x daily - 7 x weekly - 2 sets - 10 reps - Prone Shoulder Row  - 2 x daily - 7 x weekly - 2 sets - 10 reps - Prone Single Arm Shoulder Horizontal Abduction with Scapular Retraction and Palm Down  - 2 x daily - 7 x weekly - 2 sets - 10 reps - Sidelying Shoulder External Rotation  - 2 x daily - 7 x weekly - 2 sets - 10 reps - Single Arm Serratus Punches in Supine with Dumbbell  - 2 x daily - 7 x weekly - 2 sets - 10 reps  Patient Education - TENS UNIT - AUVON Dual Channel TENS Unit - TENS Unit  ASSESSMENT:  CLINICAL IMPRESSION:  Ms Shuman presents to skilled PT reporting that she is having some increased tightness today.  Patient was able to perform more exercises on soft foam roll today to assist with her ability to decrease the tightness today.  Patient did report feeling looser by end of session.  Patient continues to have limited progress with skilled PT and anticipate a discharge at end of session with patient having a follow up scheduled with her PCP in November.  OBJECTIVE IMPAIRMENTS: decreased activity tolerance, decreased ROM, decreased strength, increased muscle spasms, impaired flexibility, postural dysfunction, and pain.   ACTIVITY LIMITATIONS: carrying, lifting, sleeping, reach over head, and hygiene/grooming  PARTICIPATION LIMITATIONS: meal prep, cleaning, laundry, driving, occupation, and yard work  PERSONAL FACTORS: Profession and 1 comorbidity: chronic shoulder and LBP are also affecting patient's functional outcome.   REHAB POTENTIAL: Excellent  CLINICAL DECISION MAKING: Evolving/moderate complexity  EVALUATION COMPLEXITY: Moderate   GOALS: Goals reviewed with patient? Yes  SHORT TERM GOALS: Target date: 03/31/2024   Patient will be independent with initial HEP.  Baseline:  Goal status: Met on 03/09/24  2.  Decreased pain in the neck and shoulders by 50% with ADLs  Baseline: 25% (03/18/24) Goal status: Met on 04/15/24 (reports 75%  better)    LONG TERM GOALS: Target date: 06/18/2024   Patient will be independent with advanced/ongoing HEP to improve outcomes and carryover.  Baseline:  Goal status: Ongoing  2.  Patient will report 85% improvement in neck pain to improve QOL.  Baseline:  Goal status: Ongoing (reports 75% improvement on 04/15/24)  3.  Patient will demonstrate full pain free cervical ROM for safety with driving.  Baseline:  Goal status: Met on 04/26/24   4.   Patient will report 3/50 on NDI to demonstrate improved functional ability.  Baseline: 13/50 Goal status: Ongoing (see above)    PLAN:  PT FREQUENCY: 1-2x/week  PT DURATION: 8 weeks  PLANNED INTERVENTIONS: [id number]- PT Re-evaluation, 97750- Physical Performance Testing, 97110-Therapeutic exercises, 97530- Therapeutic activity, [id number]- Neuromuscular re-education, 97535- Self Care, [id number]- Manual therapy, [id number]- Gait training, [phone]- Canalith repositioning, [id number]- Aquatic Therapy, [phone]- Electrical stimulation (unattended), [phone]- Electrical stimulation (manual), [id number]- Vasopneumatic device, [id number]- Ultrasound, [id number]- Traction (mechanical), [id number]- Ionotophoresis 4mg /ml Dexamethasone , [id number] (1-2 muscles), [id number] (3+ muscles)- Dry Needling, Patient/Family education, Balance training, Stair training, Taping, Joint mobilization, Spinal manipulation, Spinal mobilization, Vestibular training, Cryotherapy, and Moist heat.  PLAN FOR NEXT SESSION:  Continue  postural strength,  cervical ROM, postural strengthening, manual/dry needling as needed   Jarrell Laming, PT, DPT 06/10/24, 8:27 AM  [facility] [address] [place], [place] [address] Phone # [phone] Fax [phone]

## 2024-06-14 ENCOUNTER — Ambulatory Visit: Admitting: Rehabilitative and Restorative Service Providers"

## 2024-06-14 ENCOUNTER — Encounter: Payer: Self-pay | Admitting: Rehabilitative and Restorative Service Providers"

## 2024-06-14 DIAGNOSIS — R252 Cramp and spasm: Secondary | ICD-10-CM | POA: Diagnosis not present

## 2024-06-14 DIAGNOSIS — M5416 Radiculopathy, lumbar region: Secondary | ICD-10-CM

## 2024-06-14 DIAGNOSIS — M6281 Muscle weakness (generalized): Secondary | ICD-10-CM

## 2024-06-14 DIAGNOSIS — R293 Abnormal posture: Secondary | ICD-10-CM

## 2024-06-14 DIAGNOSIS — R262 Difficulty in walking, not elsewhere classified: Secondary | ICD-10-CM | POA: Diagnosis not present

## 2024-06-14 DIAGNOSIS — M542 Cervicalgia: Secondary | ICD-10-CM | POA: Diagnosis not present

## 2024-06-14 NOTE — Therapy (Signed)
 OUTPATIENT PHYSICAL THERAPY TREATMENT NOTE   Patient Name: Valerie [name] MRN: [id number] DOB:[birth year], 58 y.o., female Today's Date: 06/14/2024  END OF SESSION:  PT End of Session - 06/14/24 0743     Visit Number 19    Date for Recertification  06/18/24    Authorization Type [facility] [employer]    PT Start Time [phone]   Pt arrived late   PT Stop Time 0817    PT Time Calculation (min) 36 min    Activity Tolerance Patient tolerated treatment well    Behavior During Therapy [facility] for tasks assessed/performed                Past Medical History:  Diagnosis Date   Allergy    cats   Anemia    NOS   Cataract    Female fertility problem    hx of treatment   GERD (gastroesophageal reflux disease)    HSV infection    labialis   RECTAL BLEEDING 07/10/2007   Qualifier: Diagnosis of  By: Charlett MD, Apolinar POUR 2008 colonoscopy [place].    Past Surgical History:  Procedure Laterality Date   CYST REMOVAL HAND Right    DILITATION & CURRETTAGE/HYSTROSCOPY WITH VERSAPOINT RESECTION  08/27/2012   Procedure: DILATATION & CURETTAGE/HYSTEROSCOPY WITH VERSAPOINT RESECTION;  Surgeon: Marie-Lyne Lavoie, MD;  Location: [facility];  Service: Gynecology;;   EYE SURGERY  Lasik   LASIK  2009   mono vision   TRIGGER FINGER RELEASE Right 06/02/2017   Procedure: RELEASE TRIGGER FINGER/A-1 PULLEY RIGHT RING TRIGGER RELEASE;  Surgeon: Murrell Drivers, MD;  Location: [facility];  Service: Orthopedics;  Laterality: Right;   Patient Active Problem List   Diagnosis Date Noted   Genetic testing 03/01/2024   Well woman exam with routine gynecological exam 10/29/2023   Bicipital tendinitis of right shoulder 02/16/2020   Myofascial muscle pain 11/08/2019   Chronic bilateral thoracic back pain 11/08/2019   Pain of both shoulder joints 09/21/2015   Impingement syndrome of right shoulder 03/08/2015   Trigger point of right shoulder region 03/08/2015   Low back pain 03/16/2014   Wrist pain, right  02/09/2014   Nonallopathic lesion of cervical region 11/03/2013   Nonallopathic lesion of thoracic region 11/03/2013   Nonallopathic lesion-rib cage 11/03/2013   Nonallopathic lesion of lumbosacral region 11/03/2013   Nonallopathic lesion of sacral region 11/03/2013   Imbalance 10/27/2012   Neck pain 10/27/2012   Headache 07/15/2012   Abdominal cramps 02/25/2012   Abnormal stools 02/25/2012   Delayed menses 02/25/2012   Visit for preventive health examination 12/03/2011   Abdominal bloating 12/03/2011   DYSESTHESIA 09/20/2009   ARREST OF BONE DEVELOPMENT OR GROWTH 07/24/2007   DERMATOPHYTOSIS OF NAIL 07/10/2007   ALLERGIC RHINITIS 07/10/2007   GERD 07/10/2007   BLOOD IN STOOL 07/10/2007   HERPES LABIALIS, HX OF 07/10/2007    PCP: Charlett Apolinar POUR, MD   REFERRING PROVIDER: Jordan, Betty G, MD    REFERRING DIAG:  M54.9,G89.29 (ICD-10-CM) - Upper back pain, chronic  M54.50,G89.29 (ICD-10-CM) - Chronic right-sided low back pain, unspecified whether sciatica present    Rationale for Evaluation and Treatment: Rehabilitation  THERAPY DIAG:  Abnormal posture  Cramp and spasm  Cervicalgia  Muscle weakness (generalized)  Radiculopathy, lumbar region  Difficulty in walking, not elsewhere classified  ONSET DATE: 3 weeks ago  SUBJECTIVE:  SUBJECTIVE STATEMENT: Pt reports that she is feeling more muscular tension today.  States that she tried to have a relaxing weekend, otherwise.     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 Yes: NPRS scale: 3-4/10 Pain location: thoracic/shoulder Pain description: ache tight twisting Aggravating factors: stress, gardening, cleaning, carrying  Relieving factors: massage,  heat, tylenol , ice   PRECAUTIONS: None  RED FLAGS: None   WEIGHT BEARING RESTRICTIONS: No  FALLS:  Has patient fallen in last 6 months? Yes. 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  DIAGNOSTIC FINDINGS:  2019 MRI IMPRESSION: 1. At L4-5 there is a mild broad-based disc bulge. Mild bilateral facet arthropathy and bilateral lateral recess narrowing. 2. At L5-S1 there is a broad-based disc bulge with a small central disc protrusion.  PATIENT SURVEYS:  Eval: NDI  13 / 50 = 26.0 % Quick dash 20.5 / 100 = 20.5 %  04/20/2024: Neck Disability Index:  6/50 = 12 % Quick DASH:  11.4/100 = 11.4 %  04/26/2024: Quick DASH:  9.09%   COGNITION: Overall cognitive status: Within functional limits for tasks assessed    MUSCLE LENGTH: Tight UT, pectoralis, cervical paraspinals  POSTURE: rounded shoulders and forward head  PALPATION: TTP in B UT, SO, paraspinals, pecs Spinal mobiliy decreased in mid thoracic   CERVICAL ROM:  rotation 40 R  30 L pain turning L into R upper arm, flexion full pain down into thoracic spin Lateral flex R 23   L 30 pain into L shoulder  03/18/24:  Cervical sidebending: Lt 35 Rt 48. Rotation: Rt: 65   Lt 65  UE ROM: Full B except R ABD 160 (full PROM)  04/26/2024: Cervical flexion: 70 deg Cervical extension:  40 deg Cervical right sidebending: 40 deg Cervical left sidebending: 45 deg Cervical right rotation:  60 deg Cervical left rotation:  60 deg  LUMBAR ROM: Limited with R rotation 25% feels in R hip also with R SB, else ROM WNL  Negative cervical special tests    TREATMENT DATE:                                                                                                                                 06/14/2024: Nustep level 5 x5 min with PT present to discuss status Supine cervical MELT method with soft foam roll:  x15 for rotation, x15 for flexion/extension Supine with soft foam roll along spine:  snow angels x15 Supine with soft foam roll along spine: shoulder horizontal abduction with red tband x15 Supine with  soft foam roll along spine: shoulder ER with red tband x15 Cervical traction:  12 pound max, 5 pound min.  60 sec hold, 20 sec relax.  X10 min   06/10/2024: Nustep level 5 x5 min with PT present to discuss status Supine with soft foam roll along spine:  snow angels 2x10 Supine with soft foam roll along spine: shoulder horizontal abduction with red tband 2x10 Supine with soft foam roll along spine: shoulder ER with red tband 2x10 Supine with soft foam roll along spine: chest stretch with hands behind head 2x20 sec Seated 3 way green pball rollout x10 each Seated posterior delt across body arm stretch x20 sec bilat Seated extension stretch x10   06/08/2024: Nustep level 5 x5 min with PT present to discuss status 3 way scapular stabilization with yellow loop 2x5 bilat Standing lat pulldown 25# 2x10 Standing row with twist with 5# cable pulley 2x10 bilat Seated ulnar nerve stretch (hands in prayer stretch moving side to side) x10 Standing shoulder ER with red tband 2x10 Standing shoulder horizontal abduction with red tband 2x10 Standing shoulder D2 with red tband x10 bilat     PATIENT EDUCATION:  Education details: PT eval findings, anticipated POC, initial HEP, postural awareness, and role of TPDN Educated on how weak rotator cuff musculature would result in upper trap and levator compensation   Person educated: Patient Education method: Explanation, Demonstration, Tactile cues, Verbal cues, and Handouts Education comprehension: verbalized understanding and  returned demonstration  HOME EXERCISE PROGRAM: Access Code: [email or web address] URL: [email or web address] Date: 05/27/2024 Prepared by: Burnard  Exercises - Seated Cervical Retraction  - 2 x daily - 7 x weekly - 1 sets - 10 reps - 5 hold - Seated Cervical Sidebending AROM  - 1 x daily - 7 x weekly - 1 sets - 10 reps - 5 hold - Seated Assisted Cervical Rotation with Towel  - 1 x daily - 7 x weekly - 1 sets - 10 reps - Cervical Extension AROM with Strap  - 1 x daily - 7 x weekly - 1 sets - 10 reps - Sidelying Thoracic Rotation with Open Book  - 1-2 x daily - 7 x weekly - 1 sets - 10 reps - Doorway Pec Stretch at 90 Degrees Abduction  - 2 x daily - 7 x weekly - 1 sets - 3 reps - 30 seconds hold - Seated Hamstring Stretch  - 1 x daily - 7 x weekly - 2 reps - 20 sec hold - Seated Piriformis Stretch with Trunk Bend  - 1 x daily - 7 x weekly - 2 reps - 20 sec hold - Seated Thoracic Flexion and Rotation with Swiss Ball  - 1 x daily - 7 x weekly - 10 reps - Cat Cow  - 1 x daily - 7 x weekly - 2 sets - 10 reps - Quadruped Rocking Backward  - 1 x daily - 7 x weekly - 2 sets - 10 reps - Quadruped Transversus Abdominis Bracing  - 1 x daily - 7 x weekly - 2 sets - 10 reps - Bicep Stretch at Table  - 1 x daily - 7 x weekly - 1 sets - 3 reps - 30 sec hold - Cervical Diagonals Prone on Elbows  - 1 x daily - 7 x weekly - 2 sets - 10 reps - Prone Shoulder Extension - Single Arm  - 2 x daily - 7 x weekly - 2 sets - 10 reps - Prone Shoulder Row  -  2 x daily - 7 x weekly - 2 sets - 10 reps - Prone Single Arm Shoulder Horizontal Abduction with Scapular Retraction and Palm Down  - 2 x daily - 7 x weekly - 2 sets - 10 reps - Sidelying Shoulder External Rotation  - 2 x daily - 7 x weekly - 2 sets - 10 reps - Single Arm Serratus Punches in Supine with Dumbbell  - 2 x daily - 7 x weekly - 2 sets - 10 reps  Patient Education - TENS UNIT - AUVON Dual Channel TENS Unit - TENS Unit  ASSESSMENT:  CLINICAL  IMPRESSION:  Ms Bredeson presents to skilled PT 10 minutes late reporting that she is continuing to have increased pain.  Educated patient that it would be a good idea to follow up with the MD and possibly Sports Medicine (that she has seen in the past) for her continued neck pain for further assessment.  Patient was agreeable to trying cervical traction today to assess if she would have any decreased pain with the modality.  Patient unable to tolerate 20 pounds, so decreased down to 12 pounds and patient with improved tolerance.  Patient will have reassessment visit next visit for possible discharge due to minimal progress, pending response to cervical traction.  OBJECTIVE IMPAIRMENTS: decreased activity tolerance, decreased ROM, decreased strength, increased muscle spasms, impaired flexibility, postural dysfunction, and pain.   ACTIVITY LIMITATIONS: carrying, lifting, sleeping, reach over head, and hygiene/grooming  PARTICIPATION LIMITATIONS: meal prep, cleaning, laundry, driving, occupation, and yard work  PERSONAL FACTORS: Profession and 1 comorbidity: chronic shoulder and LBP are also affecting patient's functional outcome.   REHAB POTENTIAL: Excellent  CLINICAL DECISION MAKING: Evolving/moderate complexity  EVALUATION COMPLEXITY: Moderate   GOALS: Goals reviewed with patient? Yes  SHORT TERM GOALS: Target date: 03/31/2024   Patient will be independent with initial HEP.  Baseline:  Goal status: Met on 03/09/24  2.  Decreased pain in the neck and shoulders by 50% with ADLs  Baseline: 25% (03/18/24) Goal status: Met on 04/15/24 (reports 75% better)    LONG TERM GOALS: Target date: 06/18/2024   Patient will be independent with advanced/ongoing HEP to improve outcomes and carryover.  Baseline:  Goal status: Ongoing  2.  Patient will report 85% improvement in neck pain to improve QOL.  Baseline:  Goal status: Ongoing (reports 75% improvement on 04/15/24)  3.  Patient will  demonstrate full pain free cervical ROM for safety with driving.  Baseline:  Goal status: Met on 04/26/24   4.   Patient will report 3/50 on NDI to demonstrate improved functional ability.  Baseline: 13/50 Goal status: Ongoing (see above)    PLAN:  PT FREQUENCY: 1-2x/week  PT DURATION: 8 weeks  PLANNED INTERVENTIONS: 97164- PT Re-evaluation, 97750- Physical Performance Testing, 97110-Therapeutic exercises, 97530- Therapeutic activity, [id number]- Neuromuscular re-education, 97535- Self Care, [id number]- Manual therapy, [id number]- Gait training, [phone]- Canalith repositioning, [id number]- Aquatic Therapy, [phone]- Electrical stimulation (unattended), [phone]- Electrical stimulation (manual), [id number]- Vasopneumatic device, [id number]- Ultrasound, [id number]- Traction (mechanical), [id number]- Ionotophoresis 4mg /ml Dexamethasone , [id number] (1-2 muscles), [id number] (3+ muscles)- Dry Needling, Patient/Family education, Balance training, Stair training, Taping, Joint mobilization, Spinal manipulation, Spinal mobilization, Vestibular training, Cryotherapy, and Moist heat.  PLAN FOR NEXT SESSION:  Continue postural strength,  cervical ROM, postural strengthening, manual/dry needling as needed, assess response to cervical traction, reassessment   Jarrell Laming, PT, DPT 06/14/24, 8:58 AM  [facility] [address] [place], [place] [address] Phone # [phone] Fax [phone]

## 2024-06-16 ENCOUNTER — Ambulatory Visit: Admitting: Rehabilitative and Restorative Service Providers"

## 2024-06-16 ENCOUNTER — Encounter: Payer: Self-pay | Admitting: Rehabilitative and Restorative Service Providers"

## 2024-06-16 DIAGNOSIS — M5416 Radiculopathy, lumbar region: Secondary | ICD-10-CM

## 2024-06-16 DIAGNOSIS — M6281 Muscle weakness (generalized): Secondary | ICD-10-CM

## 2024-06-16 DIAGNOSIS — M542 Cervicalgia: Secondary | ICD-10-CM

## 2024-06-16 DIAGNOSIS — R262 Difficulty in walking, not elsewhere classified: Secondary | ICD-10-CM | POA: Diagnosis not present

## 2024-06-16 DIAGNOSIS — R293 Abnormal posture: Secondary | ICD-10-CM

## 2024-06-16 DIAGNOSIS — R252 Cramp and spasm: Secondary | ICD-10-CM | POA: Diagnosis not present

## 2024-06-16 NOTE — Therapy (Signed)
 OUTPATIENT PHYSICAL THERAPY TREATMENT NOTE AND DISCHARGE SUMMARY  Patient Name: Valerie [name] MRN: [id number] DOB:1966/05/06, 58 y.o., female Today's Date: 06/16/2024  END OF SESSION:  PT End of Session - 06/16/24 0742     Visit Number 20    Date for Recertification  06/18/24    Authorization Type [facility] [employer]    PT Start Time [phone]    PT Stop Time 0800    PT Time Calculation (min) 26 min    Activity Tolerance Patient tolerated treatment well    Behavior During Therapy [facility] for tasks assessed/performed            Past Medical History:  Diagnosis Date   Allergy    cats   Anemia    NOS   Cataract    Female fertility problem    hx of treatment   GERD (gastroesophageal reflux disease)    HSV infection    labialis   RECTAL BLEEDING 07/10/2007   Qualifier: Diagnosis of  By: Charlett MD, Apolinar POUR 2008 colonoscopy [facility].    Past Surgical History:  Procedure Laterality Date   CYST REMOVAL HAND Right    DILITATION & CURRETTAGE/HYSTROSCOPY WITH VERSAPOINT RESECTION  08/27/2012   Procedure: DILATATION & CURETTAGE/HYSTEROSCOPY WITH VERSAPOINT RESECTION;  Surgeon: Marie-Lyne Lavoie, MD;  Location: [facility];  Service: Gynecology;;   EYE SURGERY  Lasik   LASIK  2009   mono vision   TRIGGER FINGER RELEASE Right 06/02/2017   Procedure: RELEASE TRIGGER FINGER/A-1 PULLEY RIGHT RING TRIGGER RELEASE;  Surgeon: Murrell Drivers, MD;  Location: [facility];  Service: Orthopedics;  Laterality: Right;   Patient Active Problem List   Diagnosis Date Noted   Genetic testing 03/01/2024   Well woman exam with routine gynecological exam 10/29/2023   Bicipital tendinitis of right shoulder 02/16/2020   Myofascial muscle pain 11/08/2019   Chronic bilateral thoracic back pain 11/08/2019   Pain of both shoulder joints 09/21/2015   Impingement syndrome of right shoulder 03/08/2015   Trigger point of right shoulder region 03/08/2015   Low back pain 03/16/2014   Wrist pain, right  02/09/2014   Nonallopathic lesion of cervical region 11/03/2013   Nonallopathic lesion of thoracic region 11/03/2013   Nonallopathic lesion-rib cage 11/03/2013   Nonallopathic lesion of lumbosacral region 11/03/2013   Nonallopathic lesion of sacral region 11/03/2013   Imbalance 10/27/2012   Neck pain 10/27/2012   Headache 07/15/2012   Abdominal cramps 02/25/2012   Abnormal stools 02/25/2012   Delayed menses 02/25/2012   Visit for preventive health examination 12/03/2011   Abdominal bloating 12/03/2011   DYSESTHESIA 09/20/2009   ARREST OF BONE DEVELOPMENT OR GROWTH 07/24/2007   DERMATOPHYTOSIS OF NAIL 07/10/2007   ALLERGIC RHINITIS 07/10/2007   GERD 07/10/2007   BLOOD IN STOOL 07/10/2007   HERPES LABIALIS, HX OF 07/10/2007    PCP: Charlett Apolinar POUR, MD   REFERRING PROVIDER: Jordan, Betty G, MD    REFERRING DIAG:  M54.9,G89.29 (ICD-10-CM) - Upper back pain, chronic  M54.50,G89.29 (ICD-10-CM) - Chronic right-sided low back pain, unspecified whether sciatica present    Rationale for Evaluation and Treatment: Rehabilitation  THERAPY DIAG:  Abnormal posture  Cramp and spasm  Cervicalgia  Muscle weakness (generalized)  Radiculopathy, lumbar region  Difficulty in walking, not elsewhere classified  ONSET DATE: 3 weeks ago  SUBJECTIVE:  SUBJECTIVE STATEMENT: Pt reports that traction maybe helped a little, but not as long as she was hoping.     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 Yes: NPRS scale: 3/10 Pain location: thoracic/shoulder Pain description: ache tight twisting Aggravating factors: stress, gardening, cleaning, carrying  Relieving factors: massage, heat, tylenol , ice   PRECAUTIONS:  None  RED FLAGS: None   WEIGHT BEARING RESTRICTIONS: No  FALLS:  Has patient fallen in last 6 months? Yes. 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  DIAGNOSTIC FINDINGS:  2019 MRI IMPRESSION: 1. At L4-5 there is a mild broad-based disc bulge. Mild bilateral facet arthropathy and bilateral lateral recess narrowing. 2. At L5-S1 there is a broad-based disc bulge with a small central disc protrusion.  PATIENT SURVEYS:  Eval: NDI  13 / 50 = 26.0 % Quick dash 20.5 / 100 = 20.5 %  04/20/2024: Neck Disability Index:  6/50 = 12 % Quick DASH:  11.4/100 = 11.4 %  04/26/2024: Quick DASH:  9.09%  06/16/2024: Neck Disability Index:  12/50 = 24.0% Quick DASH: 13.64%   COGNITION: Overall cognitive status: Within functional limits for tasks assessed    MUSCLE LENGTH: Tight UT, pectoralis, cervical paraspinals  POSTURE: rounded shoulders and forward head  PALPATION: TTP in B UT, SO, paraspinals, pecs Spinal mobiliy decreased in mid thoracic   CERVICAL ROM:  rotation 40 R  30 L pain turning L into R upper arm, flexion full pain down into thoracic spin Lateral flex R 23   L 30 pain into L shoulder  03/18/24:  Cervical sidebending: Lt 35 Rt 48. Rotation: Rt: 65   Lt 65  UE ROM: Full B except R ABD 160 (full PROM)  04/26/2024: Cervical flexion: 70 deg Cervical extension:  40 deg Cervical right sidebending: 40 deg Cervical left sidebending: 45 deg Cervical right rotation:  60 deg Cervical left rotation:  60 deg  LUMBAR ROM: Limited with R rotation 25% feels in R hip also with R SB, else ROM WNL  Negative cervical special  tests    TREATMENT DATE:                                                                                                                                06/16/2024: Nustep level 5 x5 min with PT present to discuss status Quadruped reach under then back up x10 bilat Cervical diagonals on elbows performing cervical diagonals x10 bilat Quadruped cat/cow x10 Standing lower trap lift off with red tband 2x10 Standing open books with red tband  x10 bilat Review of HEP and recommend following up with MD   06/14/2024: Nustep level 5 x5 min with PT present to discuss status Supine cervical MELT method with soft foam roll:  x15 for rotation, x15 for flexion/extension Supine with soft foam roll along spine:  snow angels x15 Supine with soft foam roll along spine: shoulder horizontal abduction with red tband x15 Supine with soft foam roll along spine: shoulder ER with red tband x15 Cervical traction:  12 pound max, 5 pound min.  60 sec hold, 20 sec relax.  X10 min   06/10/2024: Nustep level 5 x5 min with PT present to discuss status Supine with soft foam roll along spine:  snow angels 2x10 Supine with soft foam roll along spine: shoulder horizontal abduction with red tband 2x10 Supine with soft foam roll along spine: shoulder ER with red tband 2x10 Supine with soft foam roll along spine: chest stretch with hands behind head 2x20 sec Seated 3 way green pball rollout x10 each Seated posterior delt across body arm stretch x20 sec bilat Seated extension stretch x10     PATIENT EDUCATION:  Education details: PT eval findings, anticipated POC, initial HEP, postural awareness, and role of TPDN Educated on how weak rotator cuff musculature would result in upper trap and levator compensation   Person educated: Patient Education method: Explanation, Demonstration, Tactile cues, Verbal cues, and Handouts Education comprehension: verbalized understanding and returned demonstration  HOME EXERCISE  PROGRAM: Access Code: [email or web address] URL: [email or web address] Date: 06/16/2024 Prepared by: Jarrell Keyia Moretto  Exercises - Seated Cervical Retraction  - 2 x daily - 7 x weekly - 1 sets - 10 reps - 5 hold - Seated Cervical Sidebending AROM  - 1 x daily - 7 x weekly - 1 sets - 10 reps - 5 hold - Seated Assisted Cervical Rotation with Towel  - 1 x daily - 7 x weekly - 1 sets - 10 reps - Cervical Extension AROM with Strap  - 1 x daily - 7 x weekly - 1 sets - 10 reps - Sidelying Thoracic Rotation with Open Book  - 1-2 x daily - 7 x weekly - 1 sets - 10 reps - Doorway Pec Stretch at 90 Degrees Abduction  - 2 x daily - 7 x weekly - 1 sets - 3 reps - 30 seconds hold - Seated Hamstring Stretch  - 1 x daily - 7 x weekly - 2 reps - 20 sec hold - Seated Piriformis Stretch with Trunk Bend  - 1 x daily - 7 x weekly - 2 reps - 20 sec hold - Seated Thoracic Flexion and Rotation with Swiss Ball  - 1 x daily - 7 x weekly - 10 reps - Cat Cow  - 1 x daily - 7 x weekly - 2 sets - 10 reps - Quadruped Rocking Backward  - 1 x daily - 7 x weekly - 2 sets - 10 reps - Quadruped Transversus Abdominis Bracing  - 1 x daily - 7 x weekly - 2 sets - 10 reps - Bicep Stretch at Table  - 1 x daily - 7 x weekly - 1 sets - 3 reps - 30 sec hold - Cervical Diagonals Prone on Elbows  - 1 x daily - 7 x weekly - 2 sets - 10 reps - Prone Shoulder Extension - Single Arm  - 2 x daily - 7 x weekly - 2 sets - 10 reps - Prone Shoulder Row  - 2 x daily - 7 x  weekly - 2 sets - 10 reps - Prone Single Arm Shoulder Horizontal Abduction with Scapular Retraction and Palm Down  - 2 x daily - 7 x weekly - 2 sets - 10 reps - Sidelying Shoulder External Rotation  - 2 x daily - 7 x weekly - 2 sets - 10 reps - Single Arm Serratus Punches in Supine with Dumbbell  - 2 x daily - 7 x weekly - 2 sets - 10 reps - Standing Low Trap Setting with Resistance at Wall  - 1 x daily - 7 x weekly - 2 sets - 10 reps - Standing Thoracic Open Book at Wall  - 1 x  daily - 7 x weekly - 2 sets - 10 reps  Patient Education - TENS UNIT - AUVON Dual Channel TENS Unit - TENS Unit  ASSESSMENT:  CLINICAL IMPRESSION:  Ms Mainwaring presents to skilled PT reporting that the cervical traction did not help as much as she was hoping.  Patient has tried various modalities during PT, including manual, dry needling, cervical traction, and other exercises without much relief of her pain.  Patient with slight declines in her Neck Disability Index and the Quick DASH since last assessment.  Patient recommended to discharge from PT at this time to follow up with her MD and continue with HEP.  OBJECTIVE IMPAIRMENTS: decreased activity tolerance, decreased ROM, decreased strength, increased muscle spasms, impaired flexibility, postural dysfunction, and pain.   ACTIVITY LIMITATIONS: carrying, lifting, sleeping, reach over head, and hygiene/grooming  PARTICIPATION LIMITATIONS: meal prep, cleaning, laundry, driving, occupation, and yard work  PERSONAL FACTORS: Profession and 1 comorbidity: chronic shoulder and LBP are also affecting patient's functional outcome.   REHAB POTENTIAL: Excellent  CLINICAL DECISION MAKING: Evolving/moderate complexity  EVALUATION COMPLEXITY: Moderate   GOALS: Goals reviewed with patient? Yes  SHORT TERM GOALS: Target date: 03/31/2024   Patient will be independent with initial HEP.  Baseline:  Goal status: Met on 03/09/24  2.  Decreased pain in the neck and shoulders by 50% with ADLs  Baseline: 25% (03/18/24) Goal status: Met on 04/15/24 (reports 75% better)    LONG TERM GOALS: Target date: 06/18/2024   Patient will be independent with advanced/ongoing HEP to improve outcomes and carryover.  Baseline:  Goal status: Met on 06/16/2024  2.  Patient will report 85% improvement in neck pain to improve QOL.  Baseline:  Goal status: Partially Met (reports 75% improvement on 04/15/24)  3.  Patient will demonstrate full pain free cervical ROM  for safety with driving.  Baseline:  Goal status: Met on 04/26/24   4.   Patient will report 3/50 on NDI to demonstrate improved functional ability.  Baseline: 13/50 Goal status: Not Met on 06/16/24    PLAN:  PT FREQUENCY: 1-2x/week  PT DURATION: 8 weeks  PLANNED INTERVENTIONS: 97164- PT Re-evaluation, 97750- Physical Performance Testing, 97110-Therapeutic exercises, 97530- Therapeutic activity, 97112- Neuromuscular re-education, 97535- Self Care, 02859- Manual therapy, [id number]- Gait training, [id number]- Canalith repositioning, [id number]- Aquatic Therapy, [phone]- Electrical stimulation (unattended), [phone]- Electrical stimulation (manual), [id number]- Vasopneumatic device, [id number]- Ultrasound, [id number]- Traction (mechanical), [id number]- Ionotophoresis 4mg /ml Dexamethasone , [id number] (1-2 muscles), [id number] (3+ muscles)- Dry Needling, Patient/Family education, Balance training, Stair training, Taping, Joint mobilization, Spinal manipulation, Spinal mobilization, Vestibular training, Cryotherapy, and Moist heat.  PHYSICAL THERAPY DISCHARGE SUMMARY  Visits from Start of Care: 20  Current functional level related to goals / functional outcomes: See above for details of care.   Remaining deficits: Patient continues to have pain   Education /  Equipment: Follow up with MD for further assessment   Patient agrees to discharge. Patient goals were partially met. Patient is being discharged due to making progress, but continuing to have pain and recommend follow up with MD.    Jarrell Laming, PT, DPT 06/16/24, 9:58 AM  [facility] [address], [address] [place], [place] [address] Phone # [phone] Fax [phone]

## 2024-06-17 ENCOUNTER — Ambulatory Visit

## 2024-06-17 DIAGNOSIS — J309 Allergic rhinitis, unspecified: Secondary | ICD-10-CM | POA: Diagnosis not present

## 2024-06-22 ENCOUNTER — Other Ambulatory Visit (HOSPITAL_BASED_OUTPATIENT_CLINIC_OR_DEPARTMENT_OTHER): Payer: Self-pay

## 2024-06-22 MED ORDER — COMIRNATY 30 MCG/0.3ML IM SUSY
0.3000 mL | PREFILLED_SYRINGE | Freq: Once | INTRAMUSCULAR | 0 refills | Status: AC
  Filled 2024-06-22: qty 0.3, 1d supply, fill #0

## 2024-06-30 ENCOUNTER — Ambulatory Visit

## 2024-06-30 DIAGNOSIS — J309 Allergic rhinitis, unspecified: Secondary | ICD-10-CM

## 2024-07-06 ENCOUNTER — Ambulatory Visit

## 2024-07-06 DIAGNOSIS — J309 Allergic rhinitis, unspecified: Secondary | ICD-10-CM

## 2024-07-07 ENCOUNTER — Encounter: Attending: Physical Medicine and Rehabilitation | Admitting: Physical Medicine and Rehabilitation

## 2024-07-07 ENCOUNTER — Other Ambulatory Visit (HOSPITAL_COMMUNITY): Payer: Self-pay

## 2024-07-07 ENCOUNTER — Ambulatory Visit: Admitting: Internal Medicine

## 2024-07-07 ENCOUNTER — Encounter: Payer: Self-pay | Admitting: Physical Medicine and Rehabilitation

## 2024-07-07 VITALS — BP 118/82 | HR 76 | Ht 63.0 in | Wt 147.0 lb

## 2024-07-07 DIAGNOSIS — M5412 Radiculopathy, cervical region: Secondary | ICD-10-CM | POA: Insufficient documentation

## 2024-07-07 DIAGNOSIS — M7918 Myalgia, other site: Secondary | ICD-10-CM | POA: Insufficient documentation

## 2024-07-07 MED ORDER — DULOXETINE HCL 30 MG PO CPEP
ORAL_CAPSULE | ORAL | 5 refills | Status: AC
  Filled 2024-07-07: qty 54, 30d supply, fill #0

## 2024-07-07 MED ORDER — ONDANSETRON HCL 4 MG PO TABS
4.0000 mg | ORAL_TABLET | Freq: Three times a day (TID) | ORAL | 1 refills | Status: AC | PRN
  Filled 2024-07-07: qty 40, 14d supply, fill #0

## 2024-07-07 NOTE — Progress Notes (Signed)
 Subjective:    Patient ID: Valerie [name], female    DOB: 18-Nov-1965, 58 y.o.   MRN: [id number]  HPI  Pt is a 58 yr old female with hx of  multiple tendinitis and bursitis and secondary myofascial pain- with new onset of neck pain that's abruptly worse Here for f/u evaluation- hasn't been seen in 3+ years.     Helping her mother in law move in SNF for 3months- and had to help  The night of her funeral, had severe torticollis Now R shoulder, elbow pain-  after PT As well- PT made it worse.  Now elbow tendinis acting up.  And taut band from R  rhomboids down to R hip is tight. And across rhomboids and band across chest Didn't have before therapy.   Went to PT for neck and shoulder pain- 1-2x/week and did sme dry needling. Made Sx's worse in general.   Also L chest burning- weird sensation.   Has dysesthesia's in Arms R>L-  Not clear if has weakness But has some fine motor issues More difficulties typing and writing and dropping things.  Also having HA's form tightness and pain in neck.   Gabapentin  makes her groggy even at 100-300 mg- and only took edge off- cannot tolerate side effects.   Pain Inventory Average Pain 2-3 Pain Right Now 2-3 My pain is constant, dull, and pain in the mornings and seems to get worse depending on activities throughout the day  In the last 24 hours, has pain interfered with the following? General activity 1 Relation with others 1 Enjoyment of life 1 What TIME of day is your pain at its worst? morning  and evening Sleep (in general) Good  Pain is worse with: some activites Pain improves with: heat/ice, therapy/exercise, and pacing activities Relief from Meds: a little  Family History  Problem Relation Age of Onset   Hypertension Mother    Stroke Mother    Allergic rhinitis Mother    Miscarriages / Stillbirths Mother    Diabetes Father    Hyperlipidemia Father    Heart disease Father    Stroke Father    Arthritis Father     Hypertension Father    Colon polyps Brother        elev TG   Allergic rhinitis Brother    Ovarian cancer Maternal Aunt 43 - 69   Liver disease Maternal Uncle        alcohol related   Stroke Maternal Grandmother    Diabetes Maternal Grandmother    Heart disease Maternal Grandmother    Diabetes Maternal Grandfather    Alcohol abuse Cousin    Colon cancer Cousin 33 - 63       maternal first cousin   Breast cancer Other 42 - 25       maternal first cousin once removed   Alcohol abuse Paternal Uncle    Alcohol abuse Paternal Uncle    Arthritis Sister    Hypertension Sister    Miscarriages / Stillbirths Sister    Cancer Maternal Aunt    Diabetes Maternal Uncle    Early death Paternal Uncle    Hyperlipidemia Brother    Hypertension Brother    Social History   Socioeconomic History   Marital status: Married    Spouse name: Not on file   Number of children: Not on file   Years of education: Not on file   Highest education level: Bachelor's degree (e.g., BA, AB, BS)  Occupational History  Occupation: PA    Employer: [employer]    Comment: Rehab  Tobacco Use   Smoking status: Never    Passive exposure: Never   Smokeless tobacco: Never  Vaping Use   Vaping status: Never Used  Substance and Sexual Activity   Alcohol use: Yes    Comment: once every   Drug use: No   Sexual activity: Yes    Partners: Male    Birth control/protection: Post-menopausal    Comment: 1st intercourse- 29, partners- 1  Other Topics Concern   Not on file  Social History Narrative   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   Divorced - remarried   Regular exercise - yes   Cat exposure BF   Form [place] in [place] x 27 years   hhof 2 pet cat   Social Drivers of Corporate Investment Banker Strain: Low Risk  (05/03/2024)   Overall Financial Resource Strain (CARDIA)    Difficulty of Paying Living Expenses: Not hard at all  Food Insecurity: No Food Insecurity (05/03/2024)   Hunger Vital Sign    Worried  About Running Out of Food in the Last Year: Never true    Ran Out of Food in the Last Year: Never true  Transportation Needs: No Transportation Needs (05/03/2024)   PRAPARE - Administrator, Civil Service (Medical): No    Lack of Transportation (Non-Medical): No  Physical Activity: Insufficiently Active (05/03/2024)   Exercise Vital Sign    Days of Exercise per Week: 1 day    Minutes of Exercise per Session: 20 min  Stress: Stress Concern Present (05/03/2024)   [employer] of Occupational Health - Occupational Stress Questionnaire    Feeling of Stress: To some extent  Social Connections: Socially Integrated (05/03/2024)   Social Connection and Isolation Panel    Frequency of Communication with Friends and Family: More than three times a week    Frequency of Social Gatherings with Friends and Family: Once a week    Attends Religious Services: More than 4 times per year    Active Member of Clubs or Organizations: Yes    Attends Engineer, Structural: More than 4 times per year    Marital Status: Married   Past Surgical History:  Procedure Laterality Date   CYST REMOVAL HAND Right    DILITATION & CURRETTAGE/HYSTROSCOPY WITH VERSAPOINT RESECTION  08/27/2012   Procedure: DILATATION & CURETTAGE/HYSTEROSCOPY WITH VERSAPOINT RESECTION;  Surgeon: Percilla Burly, MD;  Location: [facility];  Service: Gynecology;;   EYE SURGERY  Lasik   LASIK  2009   mono vision   TRIGGER FINGER RELEASE Right 06/02/2017   Procedure: RELEASE TRIGGER FINGER/A-1 PULLEY RIGHT RING TRIGGER RELEASE;  Surgeon: Murrell Drivers, MD;  Location: [facility];  Service: Orthopedics;  Laterality: Right;   Past Surgical History:  Procedure Laterality Date   CYST REMOVAL HAND Right    DILITATION & CURRETTAGE/HYSTROSCOPY WITH VERSAPOINT RESECTION  08/27/2012   Procedure: DILATATION & CURETTAGE/HYSTEROSCOPY WITH VERSAPOINT RESECTION;  Surgeon: Marie-Lyne Lavoie, MD;  Location: [facility];  Service:  Gynecology;;   EYE SURGERY  Lasik   LASIK  2009   mono vision   TRIGGER FINGER RELEASE Right 06/02/2017   Procedure: RELEASE TRIGGER FINGER/A-1 PULLEY RIGHT RING TRIGGER RELEASE;  Surgeon: Murrell Drivers, MD;  Location: [facility];  Service: Orthopedics;  Laterality: Right;   Past Medical History:  Diagnosis Date   Allergy    cats   Anemia  NOS   Cataract    Female fertility problem    hx of treatment   GERD (gastroesophageal reflux disease)    HSV infection    labialis   RECTAL BLEEDING 07/10/2007   Qualifier: Diagnosis of  By: Charlett MD, Apolinar POUR 2008 colonoscopy [facility].    LMP 11/28/2014 Comment: SA  Opioid Risk Score:   Fall Risk Score:  `1  Depression screen [facility] 2/9     05/04/2024    2:34 PM 10/29/2023    3:27 PM 04/08/2023   10:44 AM 05/13/2022   11:10 AM 01/08/2022    1:41 PM 02/16/2020    9:19 AM 10/11/2019   10:16 AM  Depression screen PHQ 2/9  Decreased Interest 0 0 0 0 0 0 0  Down, Depressed, Hopeless 0 0 0 0 0 0 0  PHQ - 2 Score 0 0 0 0 0 0 0  Altered sleeping   0  0    Tired, decreased energy   0  1    Change in appetite   0  1    Feeling bad or failure about yourself    0  0    Trouble concentrating   0  0    Moving slowly or fidgety/restless   0  0    Suicidal thoughts   0  0    PHQ-9 Score   0   2     Difficult doing work/chores   Not difficult at all  Not difficult at all       Data saved with a previous flowsheet row definition     Review of Systems  Musculoskeletal:  Positive for back pain and myalgias.       Right shoulder, upper back pain radiating down into hip and low back  All other systems reviewed and are negative.      Objective:   Physical Exam  Awake, alert, appropriate, holding body stiffly; NAD MSK: Deltoids 4+/5; biceps, triceps 5-/5 B/L; WE, Grip and FA 5/5 5/5 in HF, KE, KF, DF and PF B/L  Secondary trP's in C5 more than any other muscle- R>L  Neuro: Decreased in C5 on R only;  Intact otherwise Rapid  alternating movements/fine motor movements slightly decreased speed on R      Assessment & Plan:   Pt is a 58 yr old female with hx of  multiple tendinitis and bursitis and secondary myofascial pain- with new onset of neck pain that's abruptly worse Here for f/u evaluation- hasn't been seen in 3+ years.   Cervical MRI- for Probable C5 radiculopathy- will determine next course of plan once have MRI results.    2.   For nerve pain and MSK pain- Duloxetine-   30 mg at bedtime x 1 week then increase 60 mg nightly. For nerve pain- nausea only lasts 7 days if you develop.   - can take longer to have an orgasm.   3.  Zofran    4 mg as needed 3x/day- for nausea- that could occur for 1st 7 days with Duloxetine.   4.    Can do trigger point injections- to help with secondary myofascial pain- use theracane as we've discussed before. There are videos on You tube how to use.    5. Has estim/TENs- can use if helpful. But don't use if irritating.    6. Has worsening Sx's from  PT, so will hold off on exercise program- has done and FAILED PT x 8 weeks already.  7. F/U in 3 months- will call when MRI results are back.   I spent a total of  34  minutes on total care today- >50% coordination of care- due to  d/w pt about cervical radiculopathy Sx's-

## 2024-07-07 NOTE — Patient Instructions (Signed)
 Pt is a 58 yr old female with hx of  multiple tendinitis and bursitis and secondary myofascial pain- with new onset of neck pain that's abruptly worse Here for f/u evaluation- hasn't been seen in 3+ years.   Cervical MRI- for Probable C5 radiculopathy- will determine next course of plan once have MRI results.    2.   For nerve pain and MSK pain- Duloxetine-   30 mg at bedtime x 1 week then increase 60 mg nightly. For nerve pain- nausea only lasts 7 days if you develop.   - can take longer to have an orgasm.   3.  Zofran    4 mg as needed 3x/day- for nausea- that could occur for 1st 7 days with Duloxetine.   4.    Can do trigger point injections- to help with secondary myofascial pain- use theracane as we've discussed before. There are videos on You tube how to use.    5. Has estim/TENs- can use if helpful. But don't use if irritating.    6. Has worsening Sx's from  PT, so will hold off on exercise program- has done and FAILED PT x 8 weeks already.   7. F/U in 3 months- will call when MRI results are back.

## 2024-07-08 ENCOUNTER — Other Ambulatory Visit: Payer: Self-pay | Admitting: Physical Medicine and Rehabilitation

## 2024-07-08 DIAGNOSIS — M5412 Radiculopathy, cervical region: Secondary | ICD-10-CM

## 2024-07-22 ENCOUNTER — Ambulatory Visit

## 2024-07-22 DIAGNOSIS — J309 Allergic rhinitis, unspecified: Secondary | ICD-10-CM

## 2024-07-24 ENCOUNTER — Ambulatory Visit (HOSPITAL_COMMUNITY)
Admission: RE | Admit: 2024-07-24 | Discharge: 2024-07-24 | Disposition: A | Attending: Physical Medicine and Rehabilitation | Admitting: Physical Medicine and Rehabilitation

## 2024-07-24 ENCOUNTER — Ambulatory Visit (HOSPITAL_COMMUNITY)
Admission: RE | Admit: 2024-07-24 | Discharge: 2024-07-24 | Disposition: A | Source: Ambulatory Visit | Attending: Physical Medicine and Rehabilitation | Admitting: Physical Medicine and Rehabilitation

## 2024-07-24 ENCOUNTER — Ambulatory Visit (HOSPITAL_COMMUNITY)

## 2024-07-24 ENCOUNTER — Encounter: Payer: Self-pay | Admitting: Physical Medicine and Rehabilitation

## 2024-07-24 DIAGNOSIS — M5030 Other cervical disc degeneration, unspecified cervical region: Secondary | ICD-10-CM | POA: Diagnosis not present

## 2024-07-24 DIAGNOSIS — M5412 Radiculopathy, cervical region: Secondary | ICD-10-CM | POA: Diagnosis not present

## 2024-07-24 DIAGNOSIS — M47812 Spondylosis without myelopathy or radiculopathy, cervical region: Secondary | ICD-10-CM | POA: Diagnosis not present

## 2024-07-24 DIAGNOSIS — M4322 Fusion of spine, cervical region: Secondary | ICD-10-CM | POA: Diagnosis not present

## 2024-07-29 ENCOUNTER — Ambulatory Visit (INDEPENDENT_AMBULATORY_CARE_PROVIDER_SITE_OTHER)

## 2024-07-29 DIAGNOSIS — J309 Allergic rhinitis, unspecified: Secondary | ICD-10-CM | POA: Diagnosis not present

## 2024-07-29 NOTE — Progress Notes (Signed)
 VIAL MADE ON 07/29/24

## 2024-07-30 DIAGNOSIS — J301 Allergic rhinitis due to pollen: Secondary | ICD-10-CM | POA: Diagnosis not present

## 2024-07-30 DIAGNOSIS — J3081 Allergic rhinitis due to animal (cat) (dog) hair and dander: Secondary | ICD-10-CM | POA: Diagnosis not present

## 2024-08-06 ENCOUNTER — Ambulatory Visit (INDEPENDENT_AMBULATORY_CARE_PROVIDER_SITE_OTHER)

## 2024-08-06 ENCOUNTER — Other Ambulatory Visit

## 2024-08-06 DIAGNOSIS — D72819 Decreased white blood cell count, unspecified: Secondary | ICD-10-CM

## 2024-08-06 DIAGNOSIS — J309 Allergic rhinitis, unspecified: Secondary | ICD-10-CM | POA: Diagnosis not present

## 2024-08-06 DIAGNOSIS — D649 Anemia, unspecified: Secondary | ICD-10-CM

## 2024-08-06 LAB — CBC WITH DIFFERENTIAL/PLATELET
Basophils Absolute: 0 K/uL (ref 0.0–0.1)
Basophils Relative: 0.6 % (ref 0.0–3.0)
Eosinophils Absolute: 0.1 K/uL (ref 0.0–0.7)
Eosinophils Relative: 1.5 % (ref 0.0–5.0)
HCT: 38.8 % (ref 36.0–46.0)
Hemoglobin: 12.9 g/dL (ref 12.0–15.0)
Lymphocytes Relative: 49 % — ABNORMAL HIGH (ref 12.0–46.0)
Lymphs Abs: 2 K/uL (ref 0.7–4.0)
MCHC: 33.3 g/dL (ref 30.0–36.0)
MCV: 85.1 fl (ref 78.0–100.0)
Monocytes Absolute: 0.3 K/uL (ref 0.1–1.0)
Monocytes Relative: 6.8 % (ref 3.0–12.0)
Neutro Abs: 1.7 K/uL (ref 1.4–7.7)
Neutrophils Relative %: 42.1 % — ABNORMAL LOW (ref 43.0–77.0)
Platelets: 216 K/uL (ref 150.0–400.0)
RBC: 4.56 Mil/uL (ref 3.87–5.11)
RDW: 13.3 % (ref 11.5–15.5)
WBC: 4 K/uL (ref 4.0–10.5)

## 2024-08-06 LAB — IBC + FERRITIN
Ferritin: 17.9 ng/mL (ref 10.0–291.0)
Iron: 151 ug/dL — ABNORMAL HIGH (ref 42–145)
Saturation Ratios: 35.7 % (ref 20.0–50.0)
TIBC: 422.8 ug/dL (ref 250.0–450.0)
Transferrin: 302 mg/dL (ref 212.0–360.0)

## 2024-08-09 LAB — VITAMIN B12: Vitamin B-12: 1341 pg/mL — ABNORMAL HIGH (ref 211–911)

## 2024-08-09 LAB — ANTI-NUCLEAR AB-TITER (ANA TITER): ANA Titer 1: 1:80 {titer} — ABNORMAL HIGH

## 2024-08-09 LAB — ANA: Anti Nuclear Antibody (ANA): POSITIVE — AB

## 2024-08-10 ENCOUNTER — Encounter: Payer: Self-pay | Admitting: Internal Medicine

## 2024-08-10 ENCOUNTER — Ambulatory Visit

## 2024-08-10 ENCOUNTER — Ambulatory Visit: Admitting: Internal Medicine

## 2024-08-10 VITALS — BP 114/70 | HR 68 | Temp 98.3°F | Ht 63.0 in | Wt 146.4 lb

## 2024-08-10 DIAGNOSIS — M25562 Pain in left knee: Secondary | ICD-10-CM | POA: Diagnosis not present

## 2024-08-10 DIAGNOSIS — J309 Allergic rhinitis, unspecified: Secondary | ICD-10-CM | POA: Diagnosis not present

## 2024-08-10 DIAGNOSIS — R7689 Other specified abnormal immunological findings in serum: Secondary | ICD-10-CM

## 2024-08-10 DIAGNOSIS — M19049 Primary osteoarthritis, unspecified hand: Secondary | ICD-10-CM | POA: Diagnosis not present

## 2024-08-10 DIAGNOSIS — D72819 Decreased white blood cell count, unspecified: Secondary | ICD-10-CM

## 2024-08-10 NOTE — Patient Instructions (Addendum)
" °  Not sure if the knee is related to the other problem   anterior knee pain ? Bursitis .patellar tracking ?   I will do a referral   to rheumatology about the positive ana and  symptoms you are having.   Ok  for them to do imaging as indicated  since PT not that helpful. "

## 2024-08-10 NOTE — Progress Notes (Signed)
 "  Chief Complaint  Patient presents with   Medical Management of Chronic Issues   Knee Pain    Pt c/o knee pain on L pain for the last 2 months. Was doing yard work. Denied of any injury.     HPI: Valerie [name] 58 y.o. come in for fu labs   Now knee  pain   catching  lateral patella  no specific injury  Has been in pt for shoulder other  not too helpful  Hand  deformity fingers   had labs done no new sx  ROS: See pertinent positives and negatives per HPI. Swallowing  sx  controlled   Past Medical History:  Diagnosis Date   Allergy    cats   Anemia    NOS   Cataract    Female fertility problem    hx of treatment   GERD (gastroesophageal reflux disease)    HSV infection    labialis   RECTAL BLEEDING 07/10/2007   Qualifier: Diagnosis of  By: Charlett MD, Apolinar POUR 2008 colonoscopy patterson.     Family History  Problem Relation Age of Onset   Hypertension Mother    Stroke Mother    Allergic rhinitis Mother    Miscarriages / Stillbirths Mother    Diabetes Father    Hyperlipidemia Father    Heart disease Father    Stroke Father    Arthritis Father    Hypertension Father    Colon polyps Brother        elev TG   Allergic rhinitis Brother    Ovarian cancer Maternal Aunt 55 - 69   Liver disease Maternal Uncle        alcohol related   Stroke Maternal Grandmother    Diabetes Maternal Grandmother    Heart disease Maternal Grandmother    Diabetes Maternal Grandfather    Alcohol abuse Cousin    Colon cancer Cousin 51 - 83       maternal first cousin   Breast cancer Other 58 - 66       maternal first cousin once removed   Alcohol abuse Paternal Uncle    Alcohol abuse Paternal Uncle    Arthritis Sister    Hypertension Sister    Miscarriages / Stillbirths Sister    Cancer Maternal Aunt    Diabetes Maternal Uncle    Early death Paternal Uncle    Hyperlipidemia Brother    Hypertension Brother     Social History   Socioeconomic History   Marital status: Married     Spouse name: Not on file   Number of children: Not on file   Years of education: Not on file   Highest education level: Bachelor's degree (e.g., BA, AB, BS)  Occupational History   Occupation: PA    Employer: [employer]    Comment: Rehab  Tobacco Use   Smoking status: Never    Passive exposure: Never   Smokeless tobacco: Never  Vaping Use   Vaping status: Never Used  Substance and Sexual Activity   Alcohol use: Yes    Comment: once every   Drug use: No   Sexual activity: Yes    Partners: Male    Birth control/protection: Post-menopausal    Comment: 1st intercourse- 29, partners- 1  Other Topics Concern   Not on file  Social History Narrative   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   Divorced - remarried   Regular exercise - yes   Cat exposure BF  Form [place] in [place] x 27 years   hhof 2 pet cat   Social Drivers of Health   Tobacco Use: Low Risk (08/10/2024)   Patient History    Smoking Tobacco Use: Never    Smokeless Tobacco Use: Never    Passive Exposure: Never  Financial Resource Strain: Low Risk (05/03/2024)   Overall Financial Resource Strain (CARDIA)    Difficulty of Paying Living Expenses: Not hard at all  Food Insecurity: No Food Insecurity (05/03/2024)   Epic    Worried About Programme Researcher, Broadcasting/film/video in the Last Year: Never true    Ran Out of Food in the Last Year: Never true  Transportation Needs: No Transportation Needs (05/03/2024)   Epic    Lack of Transportation (Medical): No    Lack of Transportation (Non-Medical): No  Physical Activity: Insufficiently Active (05/03/2024)   Exercise Vital Sign    Days of Exercise per Week: 1 day    Minutes of Exercise per Session: 20 min  Stress: Stress Concern Present (05/03/2024)   [employer] of Occupational Health - Occupational Stress Questionnaire    Feeling of Stress: To some extent  Social Connections: Socially Integrated (05/03/2024)   Social Connection and Isolation Panel    Frequency of Communication with  Friends and Family: More than three times a week    Frequency of Social Gatherings with Friends and Family: Once a week    Attends Religious Services: More than 4 times per year    Active Member of Clubs or Organizations: Yes    Attends Banker Meetings: More than 4 times per year    Marital Status: Married  Depression (PHQ2-9): Low Risk (05/04/2024)   Depression (PHQ2-9)    PHQ-2 Score: 0  Alcohol Screen: Low Risk (05/03/2024)   Alcohol Screen    Last Alcohol Screening Score (AUDIT): 1  Housing: Low Risk (05/03/2024)   Epic    Unable to Pay for Housing in the Last Year: No    Number of Times Moved in the Last Year: 0    Homeless in the Last Year: No  Utilities: Not At Risk (04/08/2023)   AHC Utilities    Threatened with loss of utilities: No  Health Literacy: Adequate Health Literacy (04/08/2023)   B1300 Health Literacy    Frequency of need for help with medical instructions: Never    Outpatient Medications Prior to Visit  Medication Sig Dispense Refill   APPLE CIDER VINEGAR PO Take by mouth.     Ascorbic Acid (VITAMIN C PO) Take by mouth.     Berberine Chloride (BERBERINE HCI PO) Take by mouth.     BIOTIN PO Take by mouth.     cetirizine (ZYRTEC) 10 MG tablet Take 10 mg by mouth daily as needed for allergies.     cholecalciferol (VITAMIN D) 1000 UNITS tablet Take 1,000 Units by mouth daily.     Cyanocobalamin  (VITAMIN B-12 PO) Take by mouth.     DULoxetine  (CYMBALTA ) 30 MG capsule Take 1 capsule (30 mg total) by mouth at bedtime for 7 days, THEN 2 capsules (60 mg total) at bedtime for nerve pain. 60 capsule 5   EPINEPHrine  (EPIPEN  2-PAK) 0.3 mg/0.3 mL IJ SOAJ injection Inject 0.3 mg into the muscle as needed for anaphylaxis. 2 each 1   Omega-3 Fatty Acids (FISH OIL) 1200 MG CAPS Take 1,200 mg by mouth daily.     ondansetron  (ZOFRAN ) 4 MG tablet Take 1 tablet (4 mg total) by mouth every 8 (eight)  hours as needed for nausea or vomiting. 40 tablet 1   triamcinolone   (KENALOG ) 0.1 % paste Press a small dab (thin layer) to lesion until thin film develops, don't rub in up to 3 times a day. 5 g 99   TURMERIC PO Take by mouth.     valACYclovir  (VALTREX ) 1000 MG tablet Take 2 tablets (2,000 mg total) by mouth and repeat in 12 hours or as directed (Patient taking differently: Take 2,000 mg by mouth as needed.) 30 tablet 0   gabapentin  (NEURONTIN ) 100 MG capsule Take 1 capsule (100 mg total) by mouth 2 (two) times daily. (Patient not taking: Reported on 08/10/2024) 60 capsule 0   methocarbamol  (ROBAXIN ) 500 MG tablet Take 1 tablet (500 mg total) by mouth every 8 (eight) hours as needed for muscle spasms. (Patient not taking: Reported on 08/10/2024) 45 tablet 0   No facility-administered medications prior to visit.     EXAM:  BP 114/70 (BP Location: Left Arm, Patient Position: Sitting, Cuff Size: Normal)   Pulse 68   Temp 98.3 F (36.8 C) (Oral)   Ht 5' 3 (1.6 m)   Wt 146 lb 6.4 oz (66.4 kg)   LMP 11/28/2014 Comment: SA  SpO2 96%   BMI 25.93 kg/m   Body mass index is 25.93 kg/m.  GENERAL: vitals reviewed and listed above, alert, oriented, appears well hydrated and in no acute distress  MS: moves all extremities distal fingers spoon like extensions  nodules  left Knees crepitus  patellar    lateral  left knee ant area  gait ok  PSYCH: pleasant and cooperative, no obvious depression or anxiety Lab Results  Component Value Date   WBC 4.0 08/06/2024   HGB 12.9 08/06/2024   HCT 38.8 08/06/2024   PLT 216.0 08/06/2024   GLUCOSE 98 04/30/2024   CHOL 172 04/30/2024   TRIG 42.0 04/30/2024   HDL 65.20 04/30/2024   LDLCALC 98 04/30/2024   ALT 17 04/30/2024   AST 19 04/30/2024   NA 138 04/30/2024   K 4.0 04/30/2024   CL 106 04/30/2024   CREATININE 0.73 04/30/2024   BUN 11 04/30/2024   CO2 26 04/30/2024   TSH 1.88 04/30/2024   HGBA1C 5.8 04/30/2024   BP Readings from Last 3 Encounters:  08/10/24 114/70  07/07/24 118/82  05/04/24 110/70     ASSESSMENT AND PLAN:  Discussed the following assessment and plan:  Positive ANA (antinuclear antibody) - mild positivity but has  multiple joint  sx . prob degenerative uncertain will plan referral - Plan: Ambulatory referral to Rheumatology  Leukopenia, unspecified type - improved on last cbc - Plan: Ambulatory referral to Rheumatology  Arthritis of hand, unspecified laterality - Plan: Ambulatory referral to Rheumatology  Left knee pain, unspecified chronicity Is not using  topical nsaids feels causes  aggravation of her gerd gi conditions  tries other topicals  -Patient advised to return or notify health care team  if  new concerns arise.  Patient Instructions   Not sure if the knee is related to the other problem   anterior knee pain ? Bursitis .patellar tracking ?   I will do a referral   to rheumatology about the positive ana and  symptoms you are having.   Ok  for them to do imaging as indicated  since PT not that helpful.   Niylah Hassan K. Sharlot Sturkey M.D. "

## 2024-08-17 ENCOUNTER — Ambulatory Visit (INDEPENDENT_AMBULATORY_CARE_PROVIDER_SITE_OTHER)

## 2024-08-17 DIAGNOSIS — J309 Allergic rhinitis, unspecified: Secondary | ICD-10-CM

## 2024-08-26 ENCOUNTER — Ambulatory Visit (INDEPENDENT_AMBULATORY_CARE_PROVIDER_SITE_OTHER): Admitting: *Deleted

## 2024-08-26 DIAGNOSIS — J302 Other seasonal allergic rhinitis: Secondary | ICD-10-CM

## 2024-08-26 DIAGNOSIS — J309 Allergic rhinitis, unspecified: Secondary | ICD-10-CM

## 2024-09-08 ENCOUNTER — Ambulatory Visit

## 2024-09-08 DIAGNOSIS — J302 Other seasonal allergic rhinitis: Secondary | ICD-10-CM | POA: Diagnosis not present

## 2024-10-25 ENCOUNTER — Ambulatory Visit: Admitting: Internal Medicine
